# Patient Record
Sex: Male | Born: 1955
Health system: Southern US, Community
[De-identification: ages and names within clinical notes are randomized; demographics above are authoritative.]

## PROBLEM LIST (undated history)

## (undated) DIAGNOSIS — F319 Bipolar disorder, unspecified: Secondary | ICD-10-CM

## (undated) DIAGNOSIS — N289 Disorder of kidney and ureter, unspecified: Secondary | ICD-10-CM

## (undated) DIAGNOSIS — C801 Malignant (primary) neoplasm, unspecified: Secondary | ICD-10-CM

---

## 2009-01-11 DIAGNOSIS — C801 Malignant (primary) neoplasm, unspecified: Secondary | ICD-10-CM

## 2009-01-11 HISTORY — DX: Malignant (primary) neoplasm, unspecified: C80.1

## 2012-12-16 ENCOUNTER — Emergency Department (HOSPITAL_COMMUNITY)

## 2012-12-16 ENCOUNTER — Inpatient Hospital Stay (HOSPITAL_COMMUNITY)
Admission: EM | Admit: 2012-12-16 | Discharge: 2012-12-20 | DRG: 070 | Attending: Internal Medicine | Admitting: Internal Medicine

## 2012-12-16 ENCOUNTER — Encounter (HOSPITAL_COMMUNITY): Payer: Self-pay | Admitting: Emergency Medicine

## 2012-12-16 DIAGNOSIS — M6282 Rhabdomyolysis: Secondary | ICD-10-CM | POA: Diagnosis present

## 2012-12-16 DIAGNOSIS — G9340 Encephalopathy, unspecified: Principal | ICD-10-CM | POA: Diagnosis present

## 2012-12-16 DIAGNOSIS — R918 Other nonspecific abnormal finding of lung field: Secondary | ICD-10-CM | POA: Diagnosis present

## 2012-12-16 DIAGNOSIS — N179 Acute kidney failure, unspecified: Secondary | ICD-10-CM | POA: Diagnosis present

## 2012-12-16 DIAGNOSIS — E871 Hypo-osmolality and hyponatremia: Secondary | ICD-10-CM | POA: Diagnosis present

## 2012-12-16 DIAGNOSIS — F29 Unspecified psychosis not due to a substance or known physiological condition: Secondary | ICD-10-CM | POA: Diagnosis present

## 2012-12-16 DIAGNOSIS — Z8659 Personal history of other mental and behavioral disorders: Secondary | ICD-10-CM

## 2012-12-16 DIAGNOSIS — B182 Chronic viral hepatitis C: Secondary | ICD-10-CM

## 2012-12-16 DIAGNOSIS — E869 Volume depletion, unspecified: Secondary | ICD-10-CM | POA: Diagnosis present

## 2012-12-16 DIAGNOSIS — Z87891 Personal history of nicotine dependence: Secondary | ICD-10-CM

## 2012-12-16 DIAGNOSIS — J189 Pneumonia, unspecified organism: Secondary | ICD-10-CM | POA: Diagnosis present

## 2012-12-16 DIAGNOSIS — B1920 Unspecified viral hepatitis C without hepatic coma: Secondary | ICD-10-CM | POA: Diagnosis present

## 2012-12-16 DIAGNOSIS — E876 Hypokalemia: Secondary | ICD-10-CM | POA: Diagnosis present

## 2012-12-16 DIAGNOSIS — N19 Unspecified kidney failure: Secondary | ICD-10-CM

## 2012-12-16 DIAGNOSIS — F22 Delusional disorders: Secondary | ICD-10-CM | POA: Diagnosis present

## 2012-12-16 HISTORY — DX: Malignant (primary) neoplasm, unspecified: C80.1

## 2012-12-16 LAB — CBC
HCT: 35.7 % — ABNORMAL LOW (ref 39.0–52.0)
Hemoglobin: 12.9 g/dL — ABNORMAL LOW (ref 13.0–17.0)
MCH: 31 pg (ref 26.0–34.0)
MCHC: 36.1 g/dL — ABNORMAL HIGH (ref 30.0–36.0)
RDW: 12.8 % (ref 11.5–15.5)
WBC: 20.4 10*3/uL — ABNORMAL HIGH (ref 4.0–10.5)

## 2012-12-16 LAB — COMPREHENSIVE METABOLIC PANEL
AST: 242 U/L — ABNORMAL HIGH (ref 0–37)
Alkaline Phosphatase: 64 U/L (ref 39–117)
BUN: 77 mg/dL — ABNORMAL HIGH (ref 6–23)
Calcium: 9.6 mg/dL (ref 8.4–10.5)
Chloride: 88 mEq/L — ABNORMAL LOW (ref 96–112)
GFR calc Af Amer: 26 mL/min — ABNORMAL LOW (ref >90)
Glucose, Bld: 128 mg/dL — ABNORMAL HIGH (ref 70–99)
Sodium: 124 mEq/L — ABNORMAL LOW (ref 135–145)
Total Bilirubin: 1.3 mg/dL — ABNORMAL HIGH (ref 0.3–1.2)
Total Protein: 7 g/dL (ref 6.0–8.3)

## 2012-12-16 LAB — DIFFERENTIAL
Basophils Absolute: 0 10*3/uL (ref 0.0–0.1)
Basophils Relative: 0 % (ref 0–1)
Lymphs Abs: 0.9 10*3/uL (ref 0.7–4.0)
Monocytes Relative: 9 % (ref 3–12)
Neutro Abs: 16.7 10*3/uL — ABNORMAL HIGH (ref 1.7–7.7)
Neutrophils Relative %: 87 % — ABNORMAL HIGH (ref 43–77)

## 2012-12-16 LAB — ETHANOL: Alcohol, Ethyl (B): <11 mg/dL (ref 0–11)

## 2012-12-16 MED ORDER — LORAZEPAM 2 MG/ML IJ SOLN
1.0000 mg | Freq: Once | INTRAMUSCULAR | Status: AC
  Administered 2012-12-16: 1 mg via INTRAVENOUS
  Filled 2012-12-16: qty 1

## 2012-12-16 MED ORDER — SODIUM CHLORIDE 0.9 % IV BOLUS (SEPSIS)
1000.0000 mL | Freq: Once | INTRAVENOUS | Status: AC
  Administered 2012-12-16: 1000 mL via INTRAVENOUS

## 2012-12-16 NOTE — ED Notes (Signed)
Pt belongings: Black jacket, black sneakers, black hoodie, white socks, white t-shirt, blue jeans, tan boxers.

## 2012-12-16 NOTE — ED Notes (Addendum)
Pt seen shaking in upper extremities.  Pt is yelling but responding to questions. Dr. Criss Alvine aware.

## 2012-12-16 NOTE — ED Notes (Signed)
Pt was [facility] days ago and he has no place to go,  He was seen by a passer by in an alcove [place] and they called 911,  Upon arrival of EMS he said he was [employer].  Pt arrived with [employer] also,.  Blood gluocse 135

## 2012-12-16 NOTE — ED Notes (Addendum)
Pt. has two belonging bags in locker # 26.

## 2012-12-16 NOTE — ED Notes (Signed)
Bed: QI69 Expected date:  Expected time:  Means of arrival:  Comments: EMS 57yo M hallucinations leg pain

## 2012-12-16 NOTE — ED Provider Notes (Signed)
CSN: [id number]     Arrival date & time 12/16/12  2059 History   First MD Initiated Contact with Patient 12/16/12 2130     Chief Complaint  Patient presents with  . Hallucinations   (Consider location/radiation/quality/duration/timing/severity/associated sxs/prior Treatment) HPI Comments: 57 year old male presents with EMS and police after being found sleeping downtown. patient stated that his name is [name] talk to by EMS. Her temperature is normal. Police noted that they had been called about this patient last night and he got up when asked and ambulated ok so they left him alone. Here patient states his name is [employer]. He frequently talks about God as well. He states he's had trouble walking due to leg weakness bilaterally for "a long time" but cannot specify how long. He states the correct date when asked but states he doesn't think that can be right. Denies any current alcohol or drug use. Police state he has been arrested in 5 different states per their records, including 2 on [address] When they contacted his most recent prison, they noted that he had a history of "mental illness". Patient knows president, year and month correctly.    No past medical history on file. No past surgical history on file. No family history on file. History  Substance Use Topics  . Smoking status: Former Games developer  . Smokeless tobacco: Not on file  . Alcohol Use: No    Review of Systems  Constitutional: Negative for fever.  Respiratory: Negative for cough and shortness of breath.   Gastrointestinal: Negative for vomiting.  Neurological: Positive for weakness.  Psychiatric/Behavioral: Negative for hallucinations and self-injury.  All other systems reviewed and are negative.    Allergies  Review of patient's allergies indicates no known allergies.  Home Medications  No current outpatient prescriptions on file. BP 135/68  Pulse 95  Temp(Src) 98.4 F (36.9 C) (Oral)  Resp 18  SpO2  98% Physical Exam  Nursing note and vitals reviewed. Constitutional: He is oriented to person, place, and time. He appears well-developed and well-nourished.  HENT:  Head: Normocephalic and atraumatic.  Right Ear: External ear normal.  Left Ear: External ear normal.  Nose: Nose normal.  Erythematous face with crusting  Eyes: Right eye exhibits no discharge. Left eye exhibits no discharge.  Neck: Neck supple.  Cardiovascular: Normal rate, regular rhythm, normal heart sounds and intact distal pulses.   Pulmonary/Chest: Effort normal.  Abdominal: Soft. He exhibits no distension. There is no tenderness.  Musculoskeletal: He exhibits edema (not pitting edema to bilateral lower extremities).  Neurological: He is alert and oriented to person, place, and time.  Bilateral lower extremity weakness, normal sensation  Skin: Skin is warm and dry.  Psychiatric: His speech is tangential. Thought content is delusional.    ED Course  Procedures (including critical care time) Labs Review Labs Reviewed  CBC - Abnormal; Notable for the following:    WBC 20.4 (*)    RBC 4.16 (*)    Hemoglobin 12.9 (*)    HCT 35.7 (*)    MCHC 36.1 (*)    All other components within normal limits  COMPREHENSIVE METABOLIC PANEL - Abnormal; Notable for the following:    Sodium 124 (*)    Chloride 88 (*)    Glucose, Bld 128 (*)    BUN 77 (*)    Creatinine, Ser 2.98 (*)    AST 242 (*)    ALT 65 (*)    Total Bilirubin 1.3 (*)    GFR  calc non Af Amer 22 (*)    GFR calc Af Amer 26 (*)    All other components within normal limits  SALICYLATE LEVEL - Abnormal; Notable for the following:    Salicylate Lvl <2.0 (*)    All other components within normal limits  CK - Abnormal; Notable for the following:    Total CK 6465 (*)    All other components within normal limits  DIFFERENTIAL - Abnormal; Notable for the following:    Neutrophils Relative % 87 (*)    Neutro Abs 16.7 (*)    Lymphocytes Relative 5 (*)     Monocytes Absolute 1.7 (*)    All other components within normal limits  CULTURE, BLOOD (ROUTINE X 2)  CULTURE, BLOOD (ROUTINE X 2)  ACETAMINOPHEN LEVEL  ETHANOL  URINE RAPID DRUG SCREEN (HOSP PERFORMED)  URINALYSIS, ROUTINE W REFLEX MICROSCOPIC  CG4 I-STAT (LACTIC ACID)   Imaging Review Dg Chest 2 View  12/16/2012   CLINICAL DATA:  Hallucinations; remote history of smoking.  EXAM: CHEST  2 VIEW  COMPARISON:  None.  FINDINGS: The lungs are well-aerated. Vague mild right central airspace opacity may reflect atelectasis or possibly mild pneumonia. There is no evidence of pleural effusion or pneumothorax.  The heart is normal in size; the mediastinal contour is within normal limits. No acute osseous abnormalities are seen.  IMPRESSION: Vague mild right central airspace opacity may reflect atelectasis or possibly mild pneumonia.   Electronically Signed   By: Roanna Raider M.D.   On: 12/16/2012 22:58   Ct Head Wo Contrast  12/16/2012   CLINICAL DATA:  Altered mental status.  EXAM: CT HEAD WITHOUT CONTRAST  TECHNIQUE: Contiguous axial images were obtained from the base of the skull through the vertex without intravenous contrast.  COMPARISON:  None.  FINDINGS: Normal appearing cerebral hemispheres and posterior fossa structures. Normal size and position of the ventricles. No intracranial hemorrhage, mass lesion or CT evidence of acute infarction. Nasal bone fractures without overlying soft tissue swelling. These appear somewhat corticated.  IMPRESSION: 1. No intracranial abnormality. 2. Nasal bone fractures, most likely old.   Electronically Signed   By: Gordan Payment M.D.   On: 12/16/2012 22:21    EKG Interpretation    Date/Time:  Sunday December 17 2012 01:07:08 EST Ventricular Rate:  86 PR Interval:  139 QRS Duration: 96 QT Interval:  372 QTC Calculation: 445 R Axis:   -44 Text Interpretation:  Sinus rhythm Atrial premature complexes Left axis deviation No old tracing to compare Confirmed by  Aliviyah Malanga  MD, Christia Domke ([id number]) on 12/17/2012 1:10:55 AM            MDM   1. Renal failure   2. Community acquired pneumonia   3. Psychosis    Patient appears psychotic, stating he is [employer] and stating he isn't allowed to sleep. When asked to clarify he states God won't let him. States he hasn't had water in over 2 months. Dehydrated on labs, no baseline to compare. Will give fluids and assess urine and UDS. Alert and oriented, no fever. Feel meningitis or encephalitis is less likely. Given he has been sleeping in cold weather, will cover for PNA based on his Xray. Will admit to medicine for abx and fluids. Psych consulted.     Audree Camel, MD 12/17/12 [phone]

## 2012-12-16 NOTE — ED Notes (Signed)
Pt also states he hasn't eaten in 3 days since released from prison, this was reported from EMS

## 2012-12-16 NOTE — BH Assessment (Signed)
Attempted multiple times without success to connect to tele-assessment equipment. TTS staff will go to [facility] for face-to-face assessment.  Harlin Rain Ria Comment, [facility] Triage Specialist

## 2012-12-16 NOTE — ED Notes (Signed)
Pt states "I am Andrew Chaney" and that he is weak from not "eating and drinking anything for 2.5 months." Pt denies SI/HI or A/V Hallucinations.

## 2012-12-16 NOTE — ED Notes (Signed)
Pt has a raised soft lump in the middle of the chest.

## 2012-12-16 NOTE — BH Assessment (Signed)
Received call for tele-assessment. Spoke with Dr. Criss Alvine who said Pt has a history of incarceration and was picked up by police for sleeping outside an establishment. Pt insists he is [name]. Tele-assessment will be initiated.  Harlin Rain Ria Comment, [facility] Triage Specialist

## 2012-12-17 ENCOUNTER — Encounter (HOSPITAL_COMMUNITY): Payer: Self-pay | Admitting: Emergency Medicine

## 2012-12-17 DIAGNOSIS — N179 Acute kidney failure, unspecified: Secondary | ICD-10-CM

## 2012-12-17 DIAGNOSIS — E871 Hypo-osmolality and hyponatremia: Secondary | ICD-10-CM

## 2012-12-17 DIAGNOSIS — F29 Unspecified psychosis not due to a substance or known physiological condition: Secondary | ICD-10-CM | POA: Diagnosis present

## 2012-12-17 DIAGNOSIS — F062 Psychotic disorder with delusions due to known physiological condition: Secondary | ICD-10-CM

## 2012-12-17 DIAGNOSIS — G9340 Encephalopathy, unspecified: Principal | ICD-10-CM | POA: Diagnosis present

## 2012-12-17 DIAGNOSIS — J189 Pneumonia, unspecified organism: Secondary | ICD-10-CM

## 2012-12-17 DIAGNOSIS — N19 Unspecified kidney failure: Secondary | ICD-10-CM

## 2012-12-17 DIAGNOSIS — M6282 Rhabdomyolysis: Secondary | ICD-10-CM

## 2012-12-17 LAB — RAPID URINE DRUG SCREEN, HOSP PERFORMED
Barbiturates: NOT DETECTED
Benzodiazepines: NOT DETECTED
Cocaine: NOT DETECTED
Opiates: NOT DETECTED
Tetrahydrocannabinol: NOT DETECTED

## 2012-12-17 LAB — CBC
HCT: 30.6 % — ABNORMAL LOW (ref 39.0–52.0)
Hemoglobin: 10.6 g/dL — ABNORMAL LOW (ref 13.0–17.0)
MCH: 29.9 pg (ref 26.0–34.0)
Platelets: 199 10*3/uL (ref 150–400)
RBC: 3.54 MIL/uL — ABNORMAL LOW (ref 4.22–5.81)
RDW: 12.8 % (ref 11.5–15.5)
WBC: 14.9 10*3/uL — ABNORMAL HIGH (ref 4.0–10.5)

## 2012-12-17 LAB — RAPID HIV SCREEN (WH-MAU): Rapid HIV Screen: NONREACTIVE

## 2012-12-17 LAB — URINALYSIS, ROUTINE W REFLEX MICROSCOPIC
Bilirubin Urine: NEGATIVE
Glucose, UA: NEGATIVE mg/dL
Nitrite: NEGATIVE
Specific Gravity, Urine: 1.016 (ref 1.005–1.030)
pH: 5 (ref 5.0–8.0)

## 2012-12-17 LAB — BASIC METABOLIC PANEL
Chloride: 97 mEq/L (ref 96–112)
Creatinine, Ser: 1.88 mg/dL — ABNORMAL HIGH (ref 0.50–1.35)
GFR calc Af Amer: 45 mL/min — ABNORMAL LOW (ref >90)
Potassium: 3.9 mEq/L (ref 3.5–5.1)
Sodium: 131 mEq/L — ABNORMAL LOW (ref 135–145)

## 2012-12-17 LAB — URINE MICROSCOPIC-ADD ON

## 2012-12-17 LAB — VITAMIN B12: Vitamin B-12: 621 pg/mL (ref 211–911)

## 2012-12-17 LAB — NA AND K (SODIUM & POTASSIUM), RAND UR: Sodium, Ur: <15 mEq/L

## 2012-12-17 MED ORDER — ONDANSETRON HCL 4 MG/2ML IJ SOLN: 4.0000 mg | Freq: Four times a day (QID) | INTRAMUSCULAR | Status: DC | PRN

## 2012-12-17 MED ORDER — DEXTROSE 5 % IV SOLN
500.0000 mg | INTRAVENOUS | Status: DC
  Administered 2012-12-18: 500 mg via INTRAVENOUS
  Filled 2012-12-17: qty 500

## 2012-12-17 MED ORDER — ENOXAPARIN SODIUM 30 MG/0.3ML ~~LOC~~ SOLN
30.0000 mg | SUBCUTANEOUS | Status: DC
  Administered 2012-12-17: 30 mg via SUBCUTANEOUS
  Filled 2012-12-17 (×2): qty 0.3

## 2012-12-17 MED ORDER — INFLUENZA VAC SPLIT QUAD 0.5 ML IM SUSP
0.5000 mL | Freq: Once | INTRAMUSCULAR | Status: DC
  Filled 2012-12-17: qty 0.5

## 2012-12-17 MED ORDER — DEXTROSE 5 % IV SOLN
1.0000 g | INTRAVENOUS | Status: DC
  Administered 2012-12-18 – 2012-12-20 (×3): 1 g via INTRAVENOUS
  Filled 2012-12-17 (×3): qty 10

## 2012-12-17 MED ORDER — BIOTENE DRY MOUTH MT LIQD
15.0000 mL | Freq: Two times a day (BID) | OROMUCOSAL | Status: DC
  Administered 2012-12-17 – 2012-12-20 (×3): 15 mL via OROMUCOSAL

## 2012-12-17 MED ORDER — ALUM & MAG HYDROXIDE-SIMETH 200-200-20 MG/5ML PO SUSP: 30.0000 mL | Freq: Four times a day (QID) | ORAL | Status: DC | PRN

## 2012-12-17 MED ORDER — ZOLPIDEM TARTRATE 5 MG PO TABS: 5.0000 mg | ORAL_TABLET | Freq: Every evening | ORAL | Status: DC | PRN

## 2012-12-17 MED ORDER — HYDROMORPHONE HCL PF 1 MG/ML IJ SOLN: 0.5000 mg | INTRAMUSCULAR | Status: DC | PRN

## 2012-12-17 MED ORDER — SODIUM CHLORIDE 0.9 % IV SOLN: INTRAVENOUS | Status: AC

## 2012-12-17 MED ORDER — DEXTROSE 5 % IV SOLN
500.0000 mg | Freq: Once | INTRAVENOUS | Status: AC
  Administered 2012-12-17: 04:00:00 500 mg via INTRAVENOUS
  Filled 2012-12-17: qty 500

## 2012-12-17 MED ORDER — OXYCODONE HCL 5 MG PO TABS
5.0000 mg | ORAL_TABLET | ORAL | Status: DC | PRN
  Administered 2012-12-17 – 2012-12-18 (×3): 5 mg via ORAL
  Filled 2012-12-17 (×3): qty 1

## 2012-12-17 MED ORDER — BIOTENE DRY MOUTH MT LIQD
15.0000 mL | Freq: Two times a day (BID) | OROMUCOSAL | Status: DC
  Administered 2012-12-18 – 2012-12-20 (×3): 15 mL via OROMUCOSAL

## 2012-12-17 MED ORDER — ONDANSETRON HCL 4 MG PO TABS: 4.0000 mg | ORAL_TABLET | Freq: Four times a day (QID) | ORAL | Status: DC | PRN

## 2012-12-17 MED ORDER — SODIUM CHLORIDE 0.9 % IV SOLN
INTRAVENOUS | Status: DC
  Administered 2012-12-17 – 2012-12-20 (×13): via INTRAVENOUS

## 2012-12-17 MED ORDER — PNEUMOCOCCAL VAC POLYVALENT 25 MCG/0.5ML IJ INJ
0.5000 mL | INJECTION | Freq: Once | INTRAMUSCULAR | Status: DC
  Filled 2012-12-17: qty 0.5

## 2012-12-17 MED ORDER — ACETAMINOPHEN 325 MG PO TABS: 650.0000 mg | ORAL_TABLET | Freq: Four times a day (QID) | ORAL | Status: DC | PRN

## 2012-12-17 MED ORDER — DEXTROSE 5 % IV SOLN
1.0000 g | Freq: Once | INTRAVENOUS | Status: AC
  Administered 2012-12-17: 1 g via INTRAVENOUS
  Filled 2012-12-17 (×2): qty 10

## 2012-12-17 MED ORDER — ACETAMINOPHEN 650 MG RE SUPP: 650.0000 mg | Freq: Four times a day (QID) | RECTAL | Status: DC | PRN

## 2012-12-17 NOTE — Progress Notes (Addendum)
[facility] PROGRESS NOTE  Andrew Chaney WUJ:[id number] DOB: November 16, 1955 DOA: 12/16/2012 PCP: No primary provider on file.  Assessment/Plan:  Acute Encephalopathy - Metabolic versus Functional or Organic, Has a Psych history and records have been requested from [facility] to evaluate today, awaiting recommendations. Will determine where/if we can obtain his psychiatric records from [place].  -Obtain HIV, acute hepatitis panel, TSH, free T4, B12, folate, methylmalonic acid  Acute Renal Failure - Correcting with IVFs and monitor BUN/Cr.   Hyponatremia - Correcting with continued hydration of patient  -due to dehydration, should correct with IVFs, Check Urine electrolytes and Urine OSms. And monitor Na+ Levels.   Rhabdomyolysis - Increase normal saline to 250 ml/hr -Recheck CPK levels every 4 hours.   CAP - IV Rocephin and Azithromycin.   Psychiatric disorder with delusions  -Reconsult since adult BH     Code Status: Full Family Communication: None available Disposition Plan: Per psychiatry recommendation   Consultants:  Psychiatry  Procedures: Blood culture 12/17/2012; pending  CXR 12/16/2012 Vague mild right central airspace opacity may reflect atelectasis or  possibly mild pneumonia.  CT head without contrast 12/16/2012 1. No intracranial abnormality.  2. Nasal bone fractures, most likely old    Antibiotics:  Ceftriaxone 12/7>>>  Azithromycin 12/7>>>    HPI/Subjective: Andrew Chaney is a 57 y.o WM, PMHx unknown. Found by the police sleeping outside and when they spoke to him He replied that he was "[employer]". He was talking and not making much sense and was brought to the ED for an evaluation. He has been rambling on and on, and at times had episodes where he was shaking his lower extremities or his upper extremities. He reports that he just got out of prison, but the police reports that he has been released from [place] 3 days ago. He has  not been eating or drinking very well over there past 3 days. He has moments of clarity but has not been forthcoming with his medical history. He has only said that he does not have Bipolar Disorder. An attempt has been made to obtain his medical records from [place], but the records can not be released until the AM.  In the ED, Psychiatry was consulted, and he was evaluated by the EDP and found to have multiple lab abnormalities so he was referred for admission to medicine. He was evaluated by [facility] and reported that he is suicidal and has a plan to use bed sheets to hang himself and he has been placed on suicide precautions, and a 1:1 sitter has been requested. TODAY patient A./O. x1, still unable to inform medical staff of how he actually got here. Does not know where he is, when it is, why he's here. Patient does know name at this time. States just released from [place]. Continues to have nonsensical speech (rambling) Objective: Filed Vitals:   12/17/12 0122 12/17/12 0123 12/17/12 0230 12/17/12 0700  BP:   128/83 107/66  Pulse:   88 80  Temp:   98.2 F (36.8 C) 98.9 F (37.2 C)  TempSrc:   Oral Oral  Resp:   16 18  SpO2: 83% 98% 99% 98%    Intake/Output Summary (Last 24 hours) at 12/17/12 1147 Last data filed at 12/17/12 0700  Gross per 24 hour  Intake    550 ml  Output   1500 ml  Net   -950 ml   Filed Weights    Exam:   General:  A./O. x1, NAD*  Cardiovascular: Regular  with a rate, negative murmurs rubs gallops, DP/PT pulse one plus bilateral  Respiratory: Clear to auscultation bilateral  Abdomen: Soft, nontender, nondistended, plus bowel sound  Musculoskeletal: Negative pedal edema, negative cyanosis, negative rash except for a malar rash on the face.  Data Reviewed: Basic Metabolic Panel:  Recent Labs Lab 12/16/12 2135 12/17/12 0427  NA 124* 131*  K 4.3 3.9  CL 88* 97  CO2 20 22  GLUCOSE 128* 113*  BUN 77* 60*  CREATININE 2.98* 1.88*  CALCIUM 9.6  8.6   Liver Function Tests:  Recent Labs Lab 12/16/12 2135  AST 242*  ALT 65*  ALKPHOS 64  BILITOT 1.3*  PROT 7.0  ALBUMIN 4.2   No results found for this basename: LIPASE, AMYLASE,  in the last 168 hours No results found for this basename: AMMONIA,  in the last 168 hours CBC:  Recent Labs Lab 12/16/12 2135 12/16/12 2231 12/17/12 0427  WBC 20.4*  --  14.9*  NEUTROABS  --  16.7*  --   HGB 12.9*  --  10.6*  HCT 35.7*  --  30.6*  MCV 85.8  --  86.4  PLT 233  --  199   Cardiac Enzymes:  Recent Labs Lab 12/16/12 2146  CKTOTAL 6465*   BNP (last 3 results) No results found for this basename: PROBNP,  in the last 8760 hours CBG: No results found for this basename: GLUCAP,  in the last 168 hours  No results found for this or any previous visit (from the past 240 hour(s)).   Studies: Dg Chest 2 View  12/16/2012   CLINICAL DATA:  Hallucinations; remote history of smoking.  EXAM: CHEST  2 VIEW  COMPARISON:  None.  FINDINGS: The lungs are well-aerated. Vague mild right central airspace opacity may reflect atelectasis or possibly mild pneumonia. There is no evidence of pleural effusion or pneumothorax.  The heart is normal in size; the mediastinal contour is within normal limits. No acute osseous abnormalities are seen.  IMPRESSION: Vague mild right central airspace opacity may reflect atelectasis or possibly mild pneumonia.   Electronically Signed   By: Roanna Raider M.D.   On: 12/16/2012 22:58   Ct Head Wo Contrast  12/16/2012   CLINICAL DATA:  Altered mental status.  EXAM: CT HEAD WITHOUT CONTRAST  TECHNIQUE: Contiguous axial images were obtained from the base of the skull through the vertex without intravenous contrast.  COMPARISON:  None.  FINDINGS: Normal appearing cerebral hemispheres and posterior fossa structures. Normal size and position of the ventricles. No intracranial hemorrhage, mass lesion or CT evidence of acute infarction. Nasal bone fractures without overlying  soft tissue swelling. These appear somewhat corticated.  IMPRESSION: 1. No intracranial abnormality. 2. Nasal bone fractures, most likely old.   Electronically Signed   By: Gordan Payment M.D.   On: 12/16/2012 22:21    Scheduled Meds: . sodium chloride   Intravenous STAT  . antiseptic oral rinse  15 mL Mouth Rinse BID  . antiseptic oral rinse  15 mL Mouth Rinse BID  . [START ON 12/18/2012] azithromycin  500 mg Intravenous Q24H  . [START ON 12/18/2012] cefTRIAXone (ROCEPHIN)  IV  1 g Intravenous Q24H  . enoxaparin (LOVENOX) injection  30 mg Subcutaneous Q24H  . influenza vac split quadrivalent PF  0.5 mL Intramuscular Once  . pneumococcal 23 valent vaccine  0.5 mL Intramuscular Once   Continuous Infusions: . sodium chloride 100 mL/hr at 12/17/12 0300    Principal Problem:   Acute encephalopathy  Active Problems:   Renal failure   ARF (acute renal failure)   Hyponatremia   CAP (community acquired pneumonia)   Rhabdomyolysis    Time spent: 40 minutes   Ayeisha Lindenberger, J  [facility] Pager [phone]. If 7PM-7AM, please contact night-coverage at [email or web address], password [facility] 12/17/2012, 11:47 AM  LOS: 1 day

## 2012-12-17 NOTE — Progress Notes (Signed)
Report received at 1:30am on pt going to Rm#[id number] for tele-monitoring.

## 2012-12-17 NOTE — Progress Notes (Signed)
12/17/2012 1650 Plan is to dc to prison once released from hospital. NCM will continue to follow for dc needs. Isidoro Donning RN CCM Case Mgmt phone [phone]

## 2012-12-17 NOTE — H&P (Signed)
[facility] History and Physical  Andrew Chaney ZOX:[id number] DOB: [birth year] DOA: 12/16/2012  Referring physician:  PCP: No primary provider on file.  Specialists:   Chief Complaint:  Hallucinations  HPI: Andrew Chaney is a 57 y.o. male who was found by the police sleeping outside and when they spoke to him  He replied that he was "[address]".   He was talking and not making much sense and was brought to the ED for an evaluation.  He has been rambling on and on, and at times had episodes where he was shaking his lower extremities or his upper extremities.   He reports that he just got out of prison, but the police reports that he has been released from [place] 3 days ago.  He has not been eating or drinking very well over there past 3 days.  He has moments of clarity but has not been forthcoming with his medical history.   He has only said that he does not have Bipolar Disorder.  An attempt has been made to obtain his medical records from [place], but the records can not be released until the AM.      In the ED, Psychiatry was consulted, and he was evaluated by the EDP and found to have multiple lab abnormalities so he was referred for admission to medicine.   He was evaluated by [facility] and reported that he is suicidal and has a plan to use bed sheets to hang himself and he has been placed on suicide precautions, and a 1:1 sitter has been requested.      Review of Systems:   Unable to Obtain from the Patient   No past medical history on file.  No past surgical history on file.   Prior to Admission medications   Not on File    No Known Allergies     Social History:  reports that he has quit smoking. He does not have any smokeless tobacco history on file. He reports that he uses illicit drugs (Marijuana). He reports that he does not drink alcohol.     No family history on file.    Physical Exam:  GEN: Disheveled 57 y.o. thin Caucasian male  examined  and in  no acute distress;  Filed Vitals:   12/17/12 0115 12/17/12 0120 12/17/12 0122 12/17/12 0123  BP:  103/53    Pulse: 83 82    Temp:      TempSrc:      Resp: 13 14    SpO2: 99% 98% 83% 98%   Blood pressure 103/53, pulse 82, temperature 97.7 F (36.5 C), temperature source Rectal, resp. rate 14, SpO2 98.00%. PSYCH: He is alert and oriented x 2; does not appear anxious does not appear depressed; affect is normal HEENT: Normocephalic and Atraumatic, Mucous membranes pink; PERRLA; EOM intact; Fundi:  Benign;  No scleral icterus, Nares: Patent, Oropharynx: Clear, Fair Dentition, Neck:  FROM, no cervical lymphadenopathy nor thyromegaly or carotid bruit; no JVD; Breasts:: Not examined CHEST WALL: No tenderness CHEST: Normal respiration, clear to auscultation bilaterally HEART: Regular rate and rhythm; no murmurs rubs or gallops BACK: No kyphosis or scoliosis; no CVA tenderness ABDOMEN: Positive Bowel Sounds, Scaphoid, soft non-tender; no masses, no organomegaly. Rectal Exam: Not done EXTREMITIES: No bone or joint deformity; age-appropriate arthropathy of the hands and knees; no cyanosis, clubbing or edema; no ulcerations. Genitalia: not examined PULSES: 2+ and symmetric SKIN: Decreased Skin Turgor,  +Scaling of  Face  CNS: Cranial nerves 2-12 grossly  intact no focal neurologic deficit.  MME Score:      Labs on Admission:  Basic Metabolic Panel:  Recent Labs Lab 12/16/12 2135  NA 124*  K 4.3  CL 88*  CO2 20  GLUCOSE 128*  BUN 77*  CREATININE 2.98*  CALCIUM 9.6   Liver Function Tests:  Recent Labs Lab 12/16/12 2135  AST 242*  ALT 65*  ALKPHOS 64  BILITOT 1.3*  PROT 7.0  ALBUMIN 4.2   No results found for this basename: LIPASE, AMYLASE,  in the last 168 hours No results found for this basename: AMMONIA,  in the last 168 hours CBC:  Recent Labs Lab 12/16/12 2135 12/16/12 2231  WBC 20.4*  --   NEUTROABS  --  16.7*  HGB 12.9*  --   HCT 35.7*  --   MCV 85.8  --    PLT 233  --    Cardiac Enzymes:  Recent Labs Lab 12/16/12 2146  CKTOTAL 6465*    BNP (last 3 results) No results found for this basename: PROBNP,  in the last 8760 hours CBG: No results found for this basename: GLUCAP,  in the last 168 hours  Radiological Exams on Admission: Dg Chest 2 View  12/16/2012   CLINICAL DATA:  Hallucinations; remote history of smoking.  EXAM: CHEST  2 VIEW  COMPARISON:  None.  FINDINGS: The lungs are well-aerated. Vague mild right central airspace opacity may reflect atelectasis or possibly mild pneumonia. There is no evidence of pleural effusion or pneumothorax.  The heart is normal in size; the mediastinal contour is within normal limits. No acute osseous abnormalities are seen.  IMPRESSION: Vague mild right central airspace opacity may reflect atelectasis or possibly mild pneumonia.   Electronically Signed   By: Roanna Raider M.D.   On: 12/16/2012 22:58   Ct Head Wo Contrast  12/16/2012   CLINICAL DATA:  Altered mental status.  EXAM: CT HEAD WITHOUT CONTRAST  TECHNIQUE: Contiguous axial images were obtained from the base of the skull through the vertex without intravenous contrast.  COMPARISON:  None.  FINDINGS: Normal appearing cerebral hemispheres and posterior fossa structures. Normal size and position of the ventricles. No intracranial hemorrhage, mass lesion or CT evidence of acute infarction. Nasal bone fractures without overlying soft tissue swelling. These appear somewhat corticated.  IMPRESSION: 1. No intracranial abnormality. 2. Nasal bone fractures, most likely old.   Electronically Signed   By: Gordan Payment M.D.   On: 12/16/2012 22:21     EKG: Independently reviewed.  Normal Sinus Rhythm, No Acute S-T changes   Assessment/Plan Principal Problem:   Acute encephalopathy Active Problems:   Renal failure   ARF (acute renal failure)   Hyponatremia   CAP (community acquired pneumonia)   Rhabdomyolysis      1.   Acute Encephalopathy-    Metabolic versus Functional or Organic,  Has a Psych history and records have been requested from [facility].   Hallucinations most likely due to Psych History and has Psychosis.     2.   Acute Renal Failure- due to dehydration, IVFs and monitor BUN/Cr.    3.  Hyponatremia- due to dehydration, should correct with IVFs,  Check Urine electrolytes and Urine OSms.  And  Moniotr Na+ Levels.    4.  Rhabdomyolysis-  IVFs monitor CPK levels.    5.  CAP-  IV Rocephin and Azithromycin.    6.  DVT Prophylaxis ordered.    7.  Other-  Psych Behavioral health consulted, and  Social  Work Aeronautical engineer.         Code Status:   FULL CODE Family Communication:    No Family Present Disposition Plan:    Inpatient       Time spent:  33 Minutes  Ron Parker [facility] Pager [phone]  If 7PM-7AM, please contact night-coverage [email or web address] Password [email or web address] 12/17/2012, 1:39 AM

## 2012-12-17 NOTE — Consult Note (Signed)
  Recommend psychiatric inpatient admission when medically cleared. Patient not medically cleared at this time and is pending medical hospital admission.  Recommend psychiatry consult for management of suicidal ideation during hospitalization.   Agree with assessment and plan Andrew Chaney A. Dub Mikes, M.D.

## 2012-12-17 NOTE — BH Assessment (Addendum)
Assessment Note Pt brought to [facility] by [employer] after being picked up downtown.  Pt denies HI/AVH.  However, he does endorse SI with a plan to hang himself with his bed sheets.  Pt states that he was just released from prison on Dec 3 after serving 77 months for a bank robbery.  Pt states that he is on parole for 3 years and is supposed to be in a [employer].   However he never reported to the halfway house after discharge from prison.  Pt is religiously preoccupied stating, "I'm not allowed to sleep because God is not letting it happen.  You will see it on the news, CNN."  "God controls everything.  He is the true Pharmacologist.  This is all ending soon."  Pt states that he has not had water, food, or sleep for 2.5-3 months.  Pt is tangential and has flight of ideas.  Pt states, "Try not sleeping for 2 1/2 to 3 months.  97% of my vessel is made of water.  I'm losing my water.  I need sleep.  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  Pt states that his cancer is currently in remission.  Pt states that upon discharge from prison he was given a "bag of medicine."  Pt states, "I have not have meds since I left prison.  I will not take that bullshit psychotropic poison."  Pt states that his father was diagnosed with Bipolar.  Pt has been declined here at [facility] by Alberteen Sam, NP for medical acuity.       Zyire Eidson is an 57 y.o. male.   Axis I: Bipolar, mixed Axis II: Deferred Axis III: No past medical history on file. Axis IV: economic problems, housing problems, occupational problems, other psychosocial or environmental problems, problems related to legal system/crime, problems related to social environment, problems with access to health care services and problems with primary support group Axis V: 31-40 impairment in reality testing  Past Medical  History: No past medical history on file.  No past surgical history on file.  Family History: No family history on file.  Social History:  reports that he has quit smoking. He does not have any smokeless tobacco history on file. He reports that he uses illicit drugs (Marijuana). He reports that he does not drink alcohol.  Additional Social History:     CIWA: CIWA-Ar BP: 103/53 mmHg (Pt sleeping.) Pulse Rate: 82 COWS:    Allergies: No Known Allergies  Home Medications:  (Not in a hospital admission)  OB/GYN Status:  No LMP for male patient.  General Assessment Data Location of Assessment: [facility] ED Is this a Tele or Face-to-Face Assessment?: Face-to-Face Is this an Initial Assessment or a Re-assessment for this encounter?: Initial Assessment Living Arrangements: Alone;Other (Comment) (homeless) Can pt return to current living arrangement?: Yes Admission Status: Voluntary Is patient capable of signing voluntary admission?: Yes Transfer from: Home Referral Source: Self/Family/Friend     [facility] Crisis Care Plan Living Arrangements: Alone;Other (Comment) (homeless) Name of Psychiatrist: none Name of Therapist: none  Education Status Is patient currently in school?: No  Risk to self Suicidal Ideation: Yes-Currently Present Suicidal Intent: Yes-Currently Present Is patient at risk for suicide?: Yes Suicidal Plan?: Yes-Currently Present Specify Current Suicidal Plan: hang with bed sheets Access to Means: Yes Specify Access  to Suicidal Means: sheets in room What has been your use of drugs/alcohol within the last 12 months?: none Previous Attempts/Gestures: No Other Self Harm Risks: N/A Triggers for Past Attempts: Other (Comment) (N/A) Intentional Self Injurious Behavior: None Family Suicide History: No Recent stressful life event(s): Financial Problems;Legal Issues (Pt released from prison Dec 3 after 77 month sentence.) Persecutory voices/beliefs?: Yes Depression:  Yes Depression Symptoms: Insomnia;Feeling worthless/self pity;Feeling angry/irritable Substance abuse history and/or treatment for substance abuse?: No  Risk to Others Homicidal Ideation: No Thoughts of Harm to Others: No Current Homicidal Intent: No Current Homicidal Plan: No Access to Homicidal Means: No Identified Victim: none History of harm to others?: No Assessment of Violence: None Noted Violent Behavior Description: None Does patient have access to weapons?: No Criminal Charges Pending?: No (Pt on parole for 3 years.  ) Does patient have a court date: No  Psychosis Hallucinations: None noted Delusions: Persecutory  Mental Status Report Appear/Hygiene: Disheveled;Poor hygiene Eye Contact: Good Motor Activity: Unremarkable Speech: Tangential Level of Consciousness: Alert Mood: Anxious;Depressed;Preoccupied Affect: Anxious;Blunted;Depressed;Preoccupied Anxiety Level: Moderate Thought Processes: Tangential;Flight of Ideas Judgement: Impaired Orientation: Person;Place;Time;Situation Obsessive Compulsive Thoughts/Behaviors: Moderate  Cognitive Functioning Concentration: Normal  ADLScreening [facility] Assessment Services) Patient's cognitive ability adequate to safely complete daily activities?: Yes Patient able to express need for assistance with ADLs?: Yes Independently performs ADLs?: Yes (appropriate for developmental age)  Prior Inpatient Therapy Prior Inpatient Therapy: Yes Prior Therapy Facilty/Provider(s): Pt released from [facility] Dec 3. States that he had a psychiatrist there.  Reason for Treatment: Imprisoned at the time.    Prior Outpatient Therapy Prior Outpatient Therapy: No Prior Therapy Facilty/Provider(s): N/A Reason for Treatment: N/A  ADL Screening (condition at time of admission) Patient's cognitive ability adequate to safely complete daily activities?: Yes Is the patient deaf or have difficulty hearing?: No Does the patient have difficulty  seeing, even when wearing glasses/contacts?: No Does the patient have difficulty concentrating, remembering, or making decisions?: No Patient able to express need for assistance with ADLs?: Yes Does the patient have difficulty dressing or bathing?: No Independently performs ADLs?: Yes (appropriate for developmental age) Does the patient have difficulty walking or climbing stairs?: No Weakness of Legs: None Weakness of Arms/Hands: None  Home Assistive Devices/Equipment Home Assistive Devices/Equipment: None  Therapy Consults (therapy consults require a physician order) PT Evaluation Needed: No OT Evalulation Needed: No SLP Evaluation Needed: No Abuse/Neglect Assessment (Assessment to be complete while patient is alone) Physical Abuse: Denies Verbal Abuse: Denies Sexual Abuse: Denies Exploitation of patient/patient's resources: Denies Self-Neglect: Denies Values / Beliefs Cultural Requests During Hospitalization: None Spiritual Requests During Hospitalization: None Consults Spiritual Care Consult Needed: No Social Work Consult Needed: No Merchant navy officer (For Healthcare) Advance Directive: Patient does not have advance directive;Patient would not like information Pre-existing out of facility DNR order (yellow form or pink MOST form): No    Additional Information 1:1 In Past 12 Months?: No CIRT Risk: No Elopement Risk: No     Disposition:  Disposition Initial Assessment Completed for this Encounter: Yes  On Site Evaluation by:   Reviewed with Physician:    Marion Downer 12/17/2012 1:38 AM

## 2012-12-17 NOTE — Progress Notes (Signed)
Per chart, Pt to return to prison upon d/c.  No CSW needs.  Providence Crosby, LCSWA Clinical Social Work [phone]

## 2012-12-18 LAB — CBC WITH DIFFERENTIAL/PLATELET
Basophils Absolute: 0 10*3/uL (ref 0.0–0.1)
Basophils Relative: 0 % (ref 0–1)
Eosinophils Absolute: 0 10*3/uL (ref 0.0–0.7)
Eosinophils Relative: 0 % (ref 0–5)
HCT: 29.7 % — ABNORMAL LOW (ref 39.0–52.0)
Hemoglobin: 10.1 g/dL — ABNORMAL LOW (ref 13.0–17.0)
Lymphocytes Relative: 22 % (ref 12–46)
Lymphs Abs: 1.5 10*3/uL (ref 0.7–4.0)
MCH: 30.5 pg (ref 26.0–34.0)
MCHC: 34 g/dL (ref 30.0–36.0)
MCV: 89.7 fL (ref 78.0–100.0)
Monocytes Absolute: 0.8 10*3/uL (ref 0.1–1.0)
Monocytes Relative: 12 % (ref 3–12)
Neutro Abs: 4.5 10*3/uL (ref 1.7–7.7)
Neutrophils Relative %: 66 % (ref 43–77)
Platelets: 202 10*3/uL (ref 150–400)
RBC: 3.31 MIL/uL — ABNORMAL LOW (ref 4.22–5.81)
RDW: 13.4 % (ref 11.5–15.5)
WBC: 6.9 10*3/uL (ref 4.0–10.5)

## 2012-12-18 LAB — COMPREHENSIVE METABOLIC PANEL
ALT: 35 U/L (ref 0–53)
AST: 86 U/L — ABNORMAL HIGH (ref 0–37)
Albumin: 2.7 g/dL — ABNORMAL LOW (ref 3.5–5.2)
Alkaline Phosphatase: 42 U/L (ref 39–117)
BUN: 19 mg/dL (ref 6–23)
Creatinine, Ser: 0.81 mg/dL (ref 0.50–1.35)
GFR calc Af Amer: >90 mL/min (ref >90)
Glucose, Bld: 90 mg/dL (ref 70–99)
Potassium: 3.5 mEq/L (ref 3.5–5.1)
Total Bilirubin: 0.7 mg/dL (ref 0.3–1.2)
Total Protein: 5 g/dL — ABNORMAL LOW (ref 6.0–8.3)

## 2012-12-18 LAB — CK
Total CK: 1436 U/L — ABNORMAL HIGH (ref 7–232)
Total CK: 1234 U/L — ABNORMAL HIGH (ref 7–232)
Total CK: 1110 U/L — ABNORMAL HIGH (ref 7–232)
Total CK: 1004 U/L — ABNORMAL HIGH (ref 7–232)
Total CK: 828 U/L — ABNORMAL HIGH (ref 7–232)

## 2012-12-18 LAB — HEPATITIS PANEL, ACUTE
HCV Ab: REACTIVE — AB
Hep A IgM: NONREACTIVE
Hep B C IgM: NONREACTIVE
Hepatitis B Surface Ag: NEGATIVE

## 2012-12-18 LAB — FOLATE RBC: RBC Folate: 627 ng/mL — ABNORMAL HIGH (ref >280)

## 2012-12-18 LAB — CLOSTRIDIUM DIFFICILE BY PCR: Toxigenic C. Difficile by PCR: NEGATIVE

## 2012-12-18 LAB — MAGNESIUM: Magnesium: 1.9 mg/dL (ref 1.5–2.5)

## 2012-12-18 MED ORDER — ENOXAPARIN SODIUM 40 MG/0.4ML ~~LOC~~ SOLN
40.0000 mg | SUBCUTANEOUS | Status: DC
  Administered 2012-12-18 – 2012-12-19 (×2): 40 mg via SUBCUTANEOUS
  Filled 2012-12-18 (×3): qty 0.4

## 2012-12-18 MED ORDER — AZITHROMYCIN 500 MG PO TABS
500.0000 mg | ORAL_TABLET | Freq: Every day | ORAL | Status: DC
  Administered 2012-12-18 – 2012-12-19 (×2): 500 mg via ORAL
  Filled 2012-12-18 (×3): qty 1

## 2012-12-18 MED ORDER — RISPERIDONE 1 MG PO TABS
1.0000 mg | ORAL_TABLET | Freq: Every day | ORAL | Status: DC
Start: 2012-12-18 — End: 2012-12-20
  Administered 2012-12-18 – 2012-12-19 (×2): 1 mg via ORAL
  Filled 2012-12-18 (×3): qty 1

## 2012-12-18 NOTE — Progress Notes (Signed)
[facility] PROGRESS NOTE  Melchor Kirchgessner ZOX:[id number] DOB: 09/19/1955 DOA: 12/16/2012 PCP: No primary provider on file.  Assessment/Plan:  Acute Encephalopathy - Metabolic versus Functional or Organic, Has a Psych history and records have been requested from [facility] -Psychiatry evaluated patient, see note 12/8 for psychiatry   -HIV; nonreactive -Acute hepatitis panel; hepatitis C virus antibody reactive; obtain HCV viral PCR, - TSH/freeT4; WNL - B12; WNL - folate; High -methylmalonic acid; pending  Acute Renal Failure - Corrected with IVFs; monitor BUN/Cr.   Hyponatremia - Corrected with hydration.  -due to dehydration, should correct with IVFs, Check Urine electrolytes and Urine OSms. And monitor Na+ Levels.   Rhabdomyolysis - Increase normal saline to 250 ml/hr -Recheck CPK levels every 4 hours. -CK dropping should be within normal limits within 24-48 hours   CAP - Continue IV Rocephin and Azithromycin.   Psychotic disorder with delusions  -Per psychiatry start Risperdal 1 mg each bedtime  -Per psychiatry does not exhibit danger to himself or others. He does not meet criteria for involuntary admission -Per officers guarding patient when patient stable will be rearrested and taken to [place]    Code Status: Full Family Communication: None available Disposition Plan: Per psychiatry recommendation   Consultants:  Psychiatry  Procedures: Blood culture 12/17/2012; pending  CXR 12/16/2012 Vague mild right central airspace opacity may reflect atelectasis or  possibly mild pneumonia.  CT head without contrast 12/16/2012 1. No intracranial abnormality.  2. Nasal bone fractures, most likely old    Antibiotics:  Ceftriaxone 12/7>>>  Azithromycin 12/7>>>    HPI/Subjective: Andrew Chaney is a 57 y.o WM, PMHx unknown. Found by the police sleeping outside and when they spoke to him He replied that he was "[employer]". He was talking and not making much  sense and was brought to the ED for an evaluation. He has been rambling on and on, and at times had episodes where he was shaking his lower extremities or his upper extremities. He reports that he just got out of prison, but the police reports that he has been released from [place] 3 days ago. He has not been eating or drinking very well over there past 3 days. He has moments of clarity but has not been forthcoming with his medical history. He has only said that he does not have Bipolar Disorder. An attempt has been made to obtain his medical records from [facility], but the records can not be released until the AM.  In the ED, Psychiatry was consulted, and he was evaluated by the EDP and found to have multiple lab abnormalities so he was referred for admission to medicine. He was evaluated by [facility] and reported that he is suicidal and has a plan to use bed sheets to hang himself and he has been placed on suicide precautions, and a 1:1 sitter has been requested. 12/17/2012 patient A./O. Alfonse Alpers, still unable to inform medical staff of how he actually got here. Does not know where he is, when it is, why he's here. Patient does know name at this time. States just released from [place]. Continues to have nonsensical speech (rambling). TODAY patient sitting comfortably, still confused, unable to understand the need for hydration and other medication. Very paranoid     Objective: Filed Vitals:   12/17/12 2200 12/18/12 0500 12/18/12 1008 12/18/12 1352  BP: 110/64 117/70 107/63 110/65  Pulse: 78 82 69 77  Temp: 98.6 F (37 C) 98.3 F (36.8 C) 98.2 F (36.8 C) 97.6 F (36.4 C)  TempSrc: Oral Oral Oral Oral  Resp: 20 18 18 18   Height:  6' (1.829 m)    Weight:  88.451 kg (195 lb)    SpO2: 99% 100% 97% 99%    Intake/Output Summary (Last 24 hours) at 12/18/12 1927 Last data filed at 12/18/12 1812  Gross per 24 hour  Intake 6190.83 ml  Output   2875 ml  Net 3315.83 ml   Filed Weights   12/18/12  0500  Weight: 88.451 kg (195 lb)    Exam:   General:  A./O. x1, NAD  Cardiovascular: Regular with a rate, negative murmurs rubs gallops, DP/PT pulse one plus bilateral  Respiratory: Clear to auscultation bilateral  Abdomen: Soft, nontender, nondistended, plus bowel sound  Musculoskeletal: Negative pedal edema, negative cyanosis, negative rash except for a malar rash on the face.  Data Reviewed: Basic Metabolic Panel:  Recent Labs Lab 12/16/12 2135 12/17/12 0427 12/18/12 0523  NA 124* 131* 136  K 4.3 3.9 3.5  CL 88* 97 103  CO2 20 22 22   GLUCOSE 128* 113* 90  BUN 77* 60* 19  CREATININE 2.98* 1.88* 0.81  CALCIUM 9.6 8.6 8.4  MG  --   --  1.9   Liver Function Tests:  Recent Labs Lab 12/16/12 2135 12/18/12 0523  AST 242* 86*  ALT 65* 35  ALKPHOS 64 42  BILITOT 1.3* 0.7  PROT 7.0 5.0*  ALBUMIN 4.2 2.7*   No results found for this basename: LIPASE, AMYLASE,  in the last 168 hours No results found for this basename: AMMONIA,  in the last 168 hours CBC:  Recent Labs Lab 12/16/12 2135 12/16/12 2231 12/17/12 0427 12/18/12 0523  WBC 20.4*  --  14.9* 6.9  NEUTROABS  --  16.7*  --  4.5  HGB 12.9*  --  10.6* 10.1*  HCT 35.7*  --  30.6* 29.7*  MCV 85.8  --  86.4 89.7  PLT 233  --  199 202   Cardiac Enzymes:  Recent Labs Lab 12/18/12 0122 12/18/12 0523 12/18/12 0950 12/18/12 1318 12/18/12 1727  CKTOTAL 1436* 1234* 1110* 1004* 828*   BNP (last 3 results) No results found for this basename: PROBNP,  in the last 8760 hours CBG: No results found for this basename: GLUCAP,  in the last 168 hours  Recent Results (from the past 240 hour(s))  CULTURE, BLOOD (ROUTINE X 2)     Status: None   Collection Time    12/17/12  1:07 AM      Result Value Range Status   Specimen Description BLOOD RIGHT HAND   Final   Special Requests BOTTLES DRAWN AEROBIC AND ANAEROBIC 3CC   Final   Culture  Setup Time     Final   Value: 12/17/2012 13:55     Performed at [facility]   Culture     Final   Value:        BLOOD CULTURE RECEIVED NO GROWTH TO DATE CULTURE WILL BE HELD FOR 5 DAYS BEFORE ISSUING A FINAL NEGATIVE REPORT     Performed at [facility]   Report Status PENDING   Incomplete  CLOSTRIDIUM DIFFICILE BY PCR     Status: None   Collection Time    12/17/12  9:42 PM      Result Value Range Status   C difficile by pcr NEGATIVE  NEGATIVE Final   Comment: Performed at [facility]     Studies: Dg Chest 2 View  12/16/2012  CLINICAL DATA:  Hallucinations; remote history of smoking.  EXAM: CHEST  2 VIEW  COMPARISON:  None.  FINDINGS: The lungs are well-aerated. Vague mild right central airspace opacity may reflect atelectasis or possibly mild pneumonia. There is no evidence of pleural effusion or pneumothorax.  The heart is normal in size; the mediastinal contour is within normal limits. No acute osseous abnormalities are seen.  IMPRESSION: Vague mild right central airspace opacity may reflect atelectasis or possibly mild pneumonia.   Electronically Signed   By: Roanna Raider M.D.   On: 12/16/2012 22:58   Ct Head Wo Contrast  12/16/2012   CLINICAL DATA:  Altered mental status.  EXAM: CT HEAD WITHOUT CONTRAST  TECHNIQUE: Contiguous axial images were obtained from the base of the skull through the vertex without intravenous contrast.  COMPARISON:  None.  FINDINGS: Normal appearing cerebral hemispheres and posterior fossa structures. Normal size and position of the ventricles. No intracranial hemorrhage, mass lesion or CT evidence of acute infarction. Nasal bone fractures without overlying soft tissue swelling. These appear somewhat corticated.  IMPRESSION: 1. No intracranial abnormality. 2. Nasal bone fractures, most likely old.   Electronically Signed   By: Gordan Payment M.D.   On: 12/16/2012 22:21    Scheduled Meds: . antiseptic oral rinse  15 mL Mouth Rinse BID  . antiseptic oral rinse  15 mL Mouth Rinse BID  . azithromycin  500 mg Oral  QHS  . cefTRIAXone (ROCEPHIN)  IV  1 g Intravenous Q24H  . enoxaparin (LOVENOX) injection  40 mg Subcutaneous Q24H  . influenza vac split quadrivalent PF  0.5 mL Intramuscular Once  . pneumococcal 23 valent vaccine  0.5 mL Intramuscular Once   Continuous Infusions: . sodium chloride 250 mL/hr at 12/18/12 1436    Principal Problem:   Acute encephalopathy Active Problems:   Renal failure   ARF (acute renal failure)   Hyponatremia   CAP (community acquired pneumonia)   Rhabdomyolysis   Psychotic disorder with delusions    Time spent: 40 minutes   WOODS, CURTIS, J  [facility] Pager [phone]. If 7PM-7AM, please contact night-coverage at [email or web address], password [facility] 12/18/2012, 7:27 PM  LOS: 2 days

## 2012-12-18 NOTE — Progress Notes (Signed)
PHARMACIST - PHYSICIAN COMMUNICATION CONCERNING: Antibiotic IV to Oral Route Change Policy  RECOMMENDATION: This patient is receiving AZITHROMYCIN by the intravenous route.  Based on criteria approved by the Pharmacy and Therapeutics Committee, the antibiotic(s) is/are being converted to the equivalent oral dose form(s).  DESCRIPTION: These criteria include:  Patient being treated for a respiratory tract infection, urinary tract infection, or cellulitis  The patient is not neutropenic and does not exhibit a GI malabsorption state  The patient is eating (either orally or via tube) and/or has been taking other orally administered medications for a least 24 hours  The patient is improving clinically and has a Tmax < 100.5  If you have questions about this conversion, please contact the Pharmacy Department  []   [phone] )  [facility] []   [phone] )  [facility]  []   [phone] )  [facility] [x]   [phone] )  [facility]    Geoffry Paradise, PharmD, [place] Pager: [phone] 11:20 AM Pharmacy #: [phone]

## 2012-12-18 NOTE — Progress Notes (Signed)
Notified Dr. Joseph Art of patients bradycardia to 38-39 when deeply sleeping. Patient is okay and rouses easily when this occurs. Sitter at bedside. When patient wakes his heart rate goes back into the 60s. Placed continuous pulse ox per Dr. Joseph Art order. Erskin Burnet RN

## 2012-12-18 NOTE — Consult Note (Signed)
[facility] Face-to-Face Psychiatry Consult   Reason for Consult:  Psychosis Referring Physician:  Dr Rod Mae Strout is an 57 y.o. male.  Assessment: AXIS I:  Psychotic Disorder NOS AXIS II:  Deferred AXIS III:   Past Medical History  Diagnosis Date  . 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   Supportive therapy provided about ongoing stressors. Discussed crisis plan, support from social network, calling 911, coming to the Emergency Department, and calling Suicide Hotline.  Subjective:   Dennison Mcdaid is a 57 y.o. male patient admitted with high CPK and WBC count.  HPI:  Patient seen chart reviewed.  Patient is a 57 year old Caucasian man who was admitted on the medical floor because of high CPK and WBC count.  Patient was recently released from the prison after serving 77 months for bank robbery.  Initially he was seen by our assessment staff and found to be very psychotic disoriented and preoccupied with his religious thoughts.  He was picked up by increased Police Department.  He mentioned suicidal ideation with plan to hang himself with his bed sheets.  However today patient is more cooperative relevant and denies any suicidal thoughts.  He admitted that he was very mad and upset because police brought him here.  The patient has violated his parole.  Upon release from the prison he supposed to to the halfway house which he did not.  Patient denies any suicidal attempt in the past but admitted he was given lithium Risperdal and Zyprexa when he was in prison.  He also endorsed admitted at [facility] but it was Germ Cell Seminoma.  Patient reported that he has not slept for more than 2 months and does not have adequate food.   His CPK and WBC count was very high.  When I asked why he did not go to halfway house patient told he does not have enough money and he missed his bus.  Patient denies taking any recent psychotropic medication however he will consider Risperdal because it does help sleep.  He denies any hallucination but endorsed some paranoia.  He denies any homicidal thoughts or suicidal thoughts.  He has some flight of ideas however he was relevant in conversation.  He was cooperative and does not exhibit any agitation or aggression.  Patient reported that he is on parole for 3 years. HPI Elements:   Location:  Medical floor. Quality:  fair. Severity:  moderate.  Past Psychiatric History: Past Medical History  Diagnosis Date  . 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 His smoking use included Cigarettes. He smoked 0.00 packs per day. He does not have any smokeless tobacco history on file. He reports that he uses illicit drugs (Marijuana). He reports that he does not drink alcohol. History reviewed. No pertinent family history. Family History Substance Abuse: No Family Supports: No Living Arrangements: Alone Can pt return to current living arrangement?: Yes Abuse/Neglect [facility]) Physical Abuse: Denies Verbal Abuse: Denies Sexual Abuse: Denies Allergies:  No Known Allergies  ACT Assessment Complete:  Yes:    Educational Status    Risk to Self: Risk to self Suicidal Ideation: Yes-Currently Present Suicidal Intent:  Yes-Currently Present Is patient at risk for suicide?: No Suicidal Plan?: Yes-Currently Present Specify Current Suicidal Plan: hang with bed sheets Access to Means: Yes Specify Access to Suicidal Means: sheets in room What has been your use of drugs/alcohol within the last 12 months?: none Previous Attempts/Gestures: No Other Self Harm Risks: N/A Triggers for Past Attempts: Other (Comment) (N/A) Intentional Self Injurious Behavior: None Family Suicide  History: No Recent stressful life event(s): Financial Problems;Legal Issues (Pt released from prison Dec 3 after 77 month sentence.) Persecutory voices/beliefs?: Yes Depression: Yes Depression Symptoms: Insomnia;Feeling worthless/self pity;Feeling angry/irritable Substance abuse history and/or treatment for substance abuse?: Yes  Risk to Others: Risk to Others Homicidal Ideation: No Thoughts of Harm to Others: No Current Homicidal Intent: No Current Homicidal Plan: No Access to Homicidal Means: No Identified Victim: none History of harm to others?: No Assessment of Violence: None Noted Violent Behavior Description: None Does patient have access to weapons?: No Criminal Charges Pending?: No (Pt on parole for 3 years.  ) Does patient have a court date: No  Abuse: Abuse/Neglect Assessment (Assessment to be complete while patient is alone) Physical Abuse: Denies Verbal Abuse: Denies Sexual Abuse: Denies Exploitation of patient/patient's resources: Denies Self-Neglect: Denies  Prior Inpatient Therapy: Prior Inpatient Therapy Prior Inpatient Therapy: Yes Prior Therapy Facilty/Provider(s): Pt released from [facility] Dec 3. States that he had a psychiatrist there.  Reason for Treatment: Imprisoned at the time.    Prior Outpatient Therapy: Prior Outpatient Therapy Prior Outpatient Therapy: No Prior Therapy Facilty/Provider(s): N/A Reason for Treatment: N/A  Additional Information: Additional Information 1:1 In Past 12 Months?: No CIRT Risk: No Elopement Risk: No                  Objective: Blood pressure 110/65, pulse 77, temperature 97.6 F (36.4 C), temperature source Oral, resp. rate 18, height 6' (1.829 m), weight 195 lb (88.451 kg), SpO2 99.00%.Body mass index is 26.44 kg/(m^2). Results for orders placed during the hospital encounter of 12/16/12 (from the past 72 hour(s))  ACETAMINOPHEN LEVEL     Status: None   Collection Time    12/16/12  9:35 PM      Result  Value Range   Acetaminophen (Tylenol), Serum <15.0  10 - 30 ug/mL   Comment:            THERAPEUTIC CONCENTRATIONS VARY     SIGNIFICANTLY. A RANGE OF 10-30     ug/mL MAY BE AN EFFECTIVE     CONCENTRATION FOR MANY PATIENTS.     HOWEVER, SOME ARE BEST TREATED     AT CONCENTRATIONS OUTSIDE THIS     RANGE.     ACETAMINOPHEN CONCENTRATIONS     >150 ug/mL AT 4 HOURS AFTER     INGESTION AND >50 ug/mL AT 12     HOURS AFTER INGESTION ARE     OFTEN ASSOCIATED WITH TOXIC     REACTIONS.  CBC     Status: Abnormal   Collection Time    12/16/12  9:35 PM      Result Value Range   WBC 20.4 (*) 4.0 - 10.5 K/uL   RBC 4.16 (*) 4.22 - 5.81 MIL/uL   Hemoglobin 12.9 (*) 13.0 - 17.0 g/dL   HCT 40.9 (*) 81.1 - 91.4 %   MCV 85.8  78.0 - 100.0 fL   MCH 31.0  26.0 - 34.0 pg   MCHC 36.1 (*) 30.0 - 36.0 g/dL   RDW 78.2  95.6 - 21.3 %  Platelets 233  150 - 400 K/uL  COMPREHENSIVE METABOLIC PANEL     Status: Abnormal   Collection Time    12/16/12  9:35 PM      Result Value Range   Sodium 124 (*) 135 - 145 mEq/L   Potassium 4.3  3.5 - 5.1 mEq/L   Chloride 88 (*) 96 - 112 mEq/L   CO2 20  19 - 32 mEq/L   Glucose, Bld 128 (*) 70 - 99 mg/dL   BUN 77 (*) 6 - 23 mg/dL   Creatinine, Ser 1.61 (*) 0.50 - 1.35 mg/dL   Calcium 9.6  8.4 - 09.6 mg/dL   Total Protein 7.0  6.0 - 8.3 g/dL   Albumin 4.2  3.5 - 5.2 g/dL   AST 045 (*) 0 - 37 U/L   ALT 65 (*) 0 - 53 U/L   Alkaline Phosphatase 64  39 - 117 U/L   Total Bilirubin 1.3 (*) 0.3 - 1.2 mg/dL   GFR calc non Af Amer 22 (*) >90 mL/min   GFR calc Af Amer 26 (*) >90 mL/min   Comment: (NOTE)     The eGFR has been calculated using the CKD EPI equation.     This calculation has not been validated in all clinical situations.     eGFR's persistently <90 mL/min signify possible Chronic Kidney     Disease.  ETHANOL     Status: None   Collection Time    12/16/12  9:35 PM      Result Value Range   Alcohol, Ethyl (B) <11  0 - 11 mg/dL   Comment:            LOWEST  DETECTABLE LIMIT FOR     SERUM ALCOHOL IS 11 mg/dL     FOR MEDICAL PURPOSES ONLY  SALICYLATE LEVEL     Status: Abnormal   Collection Time    12/16/12  9:35 PM      Result Value Range   Salicylate Lvl <2.0 (*) 2.8 - 20.0 mg/dL  CK     Status: Abnormal   Collection Time    12/16/12  9:46 PM      Result Value Range   Total CK 6465 (*) 7 - 232 U/L  DIFFERENTIAL     Status: Abnormal   Collection Time    12/16/12 10:31 PM      Result Value Range   Neutrophils Relative % 87 (*) 43 - 77 %   Neutro Abs 16.7 (*) 1.7 - 7.7 K/uL   Lymphocytes Relative 5 (*) 12 - 46 %   Lymphs Abs 0.9  0.7 - 4.0 K/uL   Monocytes Relative 9  3 - 12 %   Monocytes Absolute 1.7 (*) 0.1 - 1.0 K/uL   Eosinophils Relative 0  0 - 5 %   Eosinophils Absolute 0.0  0.0 - 0.7 K/uL   Basophils Relative 0  0 - 1 %   Basophils Absolute 0.0  0.0 - 0.1 K/uL  CG4 I-STAT (LACTIC ACID)     Status: None   Collection Time    12/17/12 12:27 AM      Result Value Range   Lactic Acid, Venous 1.04  0.5 - 2.2 mmol/L  CULTURE, BLOOD (ROUTINE X 2)     Status: None   Collection Time    12/17/12  1:07 AM      Result Value Range   Specimen Description BLOOD RIGHT HAND     Special Requests BOTTLES DRAWN AEROBIC  AND ANAEROBIC 3CC     Culture  Setup Time       Value: 12/17/2012 13:55     Performed at [facility]   Culture       Value:        BLOOD CULTURE RECEIVED NO GROWTH TO DATE CULTURE WILL BE HELD FOR 5 DAYS BEFORE ISSUING A FINAL NEGATIVE REPORT     Performed at [facility]   Report Status PENDING    URINE RAPID DRUG SCREEN (HOSP PERFORMED)     Status: None   Collection Time    12/17/12  1:16 AM      Result Value Range   Opiates NONE DETECTED  NONE DETECTED   Cocaine NONE DETECTED  NONE DETECTED   Benzodiazepines NONE DETECTED  NONE DETECTED   Amphetamines NONE DETECTED  NONE DETECTED   Tetrahydrocannabinol NONE DETECTED  NONE DETECTED   Barbiturates NONE DETECTED  NONE DETECTED   Comment:             DRUG SCREEN FOR MEDICAL PURPOSES     ONLY.  IF CONFIRMATION IS NEEDED     FOR ANY PURPOSE, NOTIFY LAB     WITHIN 5 DAYS.                LOWEST DETECTABLE LIMITS     FOR URINE DRUG SCREEN     Drug Class       Cutoff (ng/mL)     Amphetamine      1000     Barbiturate      200     Benzodiazepine   200     Tricyclics       300     Opiates          300     Cocaine          300     THC              50  URINALYSIS, ROUTINE W REFLEX MICROSCOPIC     Status: Abnormal   Collection Time    12/17/12  1:16 AM      Result Value Range   Color, Urine YELLOW  YELLOW   APPearance CLOUDY (*) CLEAR   Specific Gravity, Urine 1.016  1.005 - 1.030   pH 5.0  5.0 - 8.0   Glucose, UA NEGATIVE  NEGATIVE mg/dL   Hgb urine dipstick MODERATE (*) NEGATIVE   Bilirubin Urine NEGATIVE  NEGATIVE   Ketones, ur NEGATIVE  NEGATIVE mg/dL   Protein, ur NEGATIVE  NEGATIVE mg/dL   Urobilinogen, UA 0.2  0.0 - 1.0 mg/dL   Nitrite NEGATIVE  NEGATIVE   Leukocytes, UA NEGATIVE  NEGATIVE  URINE MICROSCOPIC-ADD ON     Status: Abnormal   Collection Time    12/17/12  1:16 AM      Result Value Range   WBC, UA 0-2  <3 WBC/hpf   RBC / HPF 0-2  <3 RBC/hpf   Bacteria, UA RARE  RARE   Casts GRANULAR CAST (*) NEGATIVE  BASIC METABOLIC PANEL     Status: Abnormal   Collection Time    12/17/12  4:27 AM      Result Value Range   Sodium 131 (*) 135 - 145 mEq/L   Comment: DELTA CHECK NOTED   Potassium 3.9  3.5 - 5.1 mEq/L   Chloride 97  96 - 112 mEq/L   Comment: DELTA CHECK NOTED   CO2 22  19 - 32 mEq/L  Glucose, Bld 113 (*) 70 - 99 mg/dL   BUN 60 (*) 6 - 23 mg/dL   Creatinine, Ser 1.47 (*) 0.50 - 1.35 mg/dL   Comment: DELTA CHECK NOTED   Calcium 8.6  8.4 - 10.5 mg/dL   GFR calc non Af Amer 39 (*) >90 mL/min   GFR calc Af Amer 45 (*) >90 mL/min   Comment: (NOTE)     The eGFR has been calculated using the CKD EPI equation.     This calculation has not been validated in all clinical situations.     eGFR's persistently <90  mL/min signify possible Chronic Kidney     Disease.  CBC     Status: Abnormal   Collection Time    12/17/12  4:27 AM      Result Value Range   WBC 14.9 (*) 4.0 - 10.5 K/uL   RBC 3.54 (*) 4.22 - 5.81 MIL/uL   Hemoglobin 10.6 (*) 13.0 - 17.0 g/dL   Comment: DELTA CHECK NOTED     REPEATED TO VERIFY   HCT 30.6 (*) 39.0 - 52.0 %   MCV 86.4  78.0 - 100.0 fL   MCH 29.9  26.0 - 34.0 pg   MCHC 34.6  30.0 - 36.0 g/dL   RDW 82.9  56.2 - 13.0 %   Platelets 199  150 - 400 K/uL  NA AND K (SODIUM & POTASSIUM), RAND UR     Status: None   Collection Time    12/17/12  7:32 AM      Result Value Range   Sodium, Ur <15     Comment: Result repeated and verified.   Potassium Urine Timed 30     Comment: Performed at [facility]  OSMOLALITY, URINE     Status: Abnormal   Collection Time    12/17/12  7:32 AM      Result Value Range   Osmolality, Ur 368 (*) 390 - 1090 mOsm/kg   Comment: Performed at [facility]  CK     Status: Abnormal   Collection Time    12/17/12  2:15 PM      Result Value Range   Total CK 2180 (*) 7 - 232 U/L  CK     Status: Abnormal   Collection Time    12/17/12  5:42 PM      Result Value Range   Total CK 1727 (*) 7 - 232 U/L  RAPID HIV SCREEN ([facility])     Status: None   Collection Time    12/17/12  5:42 PM      Result Value Range   SUDS Rapid HIV Screen NON REACTIVE  NON REACTIVE   Comment: RESULT CALLED TO, READ BACK BY AND VERIFIED WITH:     Brynda Greathouse [id number] @ 1833 BY J SCOTTON  HEPATITIS PANEL, ACUTE     Status: Abnormal   Collection Time    12/17/12  5:42 PM      Result Value Range   Hepatitis B Surface Ag NEGATIVE  NEGATIVE   HCV Ab Reactive (*) NEGATIVE   Comment: (NOTE)  This test is for screening purposes only.  Reactive results should be     confirmed by an alternative method.  Suggest HCV Qualitative, PCR,     test code 13244.  Specimens will be stable  for reflex testing up to 3     days after collection.   Hep A IgM NON REACTIVE  NON REACTIVE   Hep B C IgM NON REACTIVE  NON REACTIVE   Comment: (NOTE)     High levels of Hepatitis B Core IgM antibody are detectable     during the acute stage of Hepatitis B. This antibody is used     to differentiate current from past HBV infection.     Performed at [facility]  TSH     Status: None   Collection Time    12/17/12  5:42 PM      Result Value Range   TSH 0.802  0.350 - 4.500 uIU/mL   Comment: Performed at [facility]  T4, FREE     Status: None   Collection Time    12/17/12  5:42 PM      Result Value Range   Free T4 1.45  0.80 - 1.80 ng/dL   Comment: Performed at [facility]  VITAMIN B12     Status: None   Collection Time    12/17/12  5:42 PM      Result Value Range   Vitamin B-12 621  211 - 911 pg/mL   Comment: Performed at [facility]  FOLATE RBC     Status: Abnormal   Collection Time    12/17/12  5:42 PM      Result Value Range   RBC Folate 627 (*) >280 ng/mL   Comment: Reference range not established for pediatric patients.     Performed at [facility]  CLOSTRIDIUM DIFFICILE BY PCR     Status: None   Collection Time    12/17/12  9:42 PM      Result Value Range   C difficile by pcr NEGATIVE  NEGATIVE   Comment: Performed at [facility]  CK     Status: Abnormal   Collection Time    12/17/12  9:55 PM      Result Value Range   Total CK 1521 (*) 7 - 232 U/L  CK     Status: Abnormal   Collection Time    12/18/12  1:22 AM      Result Value Range   Total CK 1436 (*) 7 - 232 U/L  CK     Status: Abnormal   Collection Time    12/18/12  5:23 AM      Result Value Range   Total CK 1234 (*) 7 - 232 U/L  COMPREHENSIVE METABOLIC PANEL     Status: Abnormal   Collection Time    12/18/12  5:23 AM      Result Value Range   Sodium 136  135 - 145 mEq/L   Potassium 3.5  3.5 - 5.1 mEq/L   Chloride 103  96 - 112 mEq/L   CO2 22   19 - 32 mEq/L   Glucose, Bld 90  70 - 99 mg/dL   BUN 19  6 - 23 mg/dL   Comment: DELTA CHECK NOTED     REPEATED TO VERIFY   Creatinine, Ser 0.81  0.50 - 1.35 mg/dL   Comment: DELTA CHECK NOTED     REPEATED TO VERIFY   Calcium 8.4  8.4 - 10.5 mg/dL   Total Protein 5.0 (*) 6.0 - 8.3 g/dL   Albumin 2.7 (*) 3.5 - 5.2 g/dL   AST 86 (*) 0 - 37 U/L   ALT 35  0 - 53 U/L   Alkaline Phosphatase 42  39 - 117 U/L   Total Bilirubin 0.7  0.3 - 1.2 mg/dL   GFR calc non Af Amer >90  >90 mL/min   GFR calc Af Amer >90  >90 mL/min   Comment: (NOTE)     The eGFR has been calculated using the CKD EPI equation.     This calculation has not been validated in all clinical situations.     eGFR's persistently <90 mL/min signify possible Chronic Kidney     Disease.  CBC WITH DIFFERENTIAL     Status: Abnormal   Collection Time    12/18/12  5:23 AM      Result Value Range   WBC 6.9  4.0 - 10.5 K/uL   RBC 3.31 (*) 4.22 - 5.81 MIL/uL   Hemoglobin 10.1 (*) 13.0 - 17.0 g/dL   HCT 16.1 (*) 09.6 - 04.5 %   MCV 89.7  78.0 - 100.0 fL   MCH 30.5  26.0 - 34.0 pg   MCHC 34.0  30.0 - 36.0 g/dL   RDW 40.9  81.1 - 91.4 %   Platelets 202  150 - 400 K/uL   Neutrophils Relative % 66  43 - 77 %   Neutro Abs 4.5  1.7 - 7.7 K/uL   Lymphocytes Relative 22  12 - 46 %   Lymphs Abs 1.5  0.7 - 4.0 K/uL   Monocytes Relative 12  3 - 12 %   Monocytes Absolute 0.8  0.1 - 1.0 K/uL   Eosinophils Relative 0  0 - 5 %   Eosinophils Absolute 0.0  0.0 - 0.7 K/uL   Basophils Relative 0  0 - 1 %   Basophils Absolute 0.0  0.0 - 0.1 K/uL  MAGNESIUM     Status: None   Collection Time    12/18/12  5:23 AM      Result Value Range   Magnesium 1.9  1.5 - 2.5 mg/dL  CK     Status: Abnormal   Collection Time    12/18/12  9:50 AM      Result Value Range   Total CK 1110 (*) 7 - 232 U/L  CK     Status: Abnormal   Collection Time    12/18/12  1:18 PM      Result Value Range   Total CK 1004 (*) 7 - 232 U/L   Labs are reviewed and are  pertinent for hypoglycemia count and CPK.  Current Facility-Administered Medications  Medication Dose Route Frequency Provider Last Rate Last Dose  . 0.9 %  sodium chloride infusion   Intravenous Continuous Drema Dallas, MD 250 mL/hr at 12/18/12 1436    . acetaminophen (TYLENOL) tablet 650 mg  650 mg Oral Q6H PRN Ron Parker, MD       Or  . acetaminophen (TYLENOL) suppository 650 mg  650 mg Rectal Q6H PRN Ron Parker, MD      . alum & mag hydroxide-simeth (MAALOX/MYLANTA) 200-200-20 MG/5ML suspension 30 mL  30 mL Oral Q6H PRN Ron Parker, MD      . antiseptic oral rinse (BIOTENE) solution 15 mL  15 mL Mouth Rinse BID Ron Parker, MD      . antiseptic  oral rinse (BIOTENE) solution 15 mL  15 mL Mouth Rinse BID Ron Parker, MD   15 mL at 12/17/12 0908  . azithromycin (ZITHROMAX) tablet 500 mg  500 mg Oral QHS Theda Sers, RPH      . cefTRIAXone (ROCEPHIN) 1 g in dextrose 5 % 50 mL IVPB  1 g Intravenous Q24H Ron Parker, MD   1 g at 12/18/12 0031  . enoxaparin (LOVENOX) injection 40 mg  40 mg Subcutaneous Q24H Drema Dallas, MD   40 mg at 12/18/12 1022  . HYDROmorphone (DILAUDID) injection 0.5-1 mg  0.5-1 mg Intravenous Q3H PRN Ron Parker, MD      . influenza vac split quadrivalent PF (FLUARIX) injection 0.5 mL  0.5 mL Intramuscular Once Ron Parker, MD      . ondansetron [facility]) tablet 4 mg  4 mg Oral Q6H PRN Ron Parker, MD       Or  . ondansetron (ZOFRAN) injection 4 mg  4 mg Intravenous Q6H PRN Ron Parker, MD      . oxyCODONE (Oxy IR/ROXICODONE) immediate release tablet 5 mg  5 mg Oral Q4H PRN Ron Parker, MD   5 mg at 12/17/12 2010  . pneumococcal 23 valent vaccine (PNU-IMMUNE) injection 0.5 mL  0.5 mL Intramuscular Once Ron Parker, MD      . zolpidem (AMBIEN) tablet 5 mg  5 mg Oral QHS PRN Ron Parker, MD        Psychiatric Specialty Exam:     Blood pressure 110/65, pulse 77,  temperature 97.6 F (36.4 C), temperature source Oral, resp. rate 18, height 6' (1.829 m), weight 195 lb (88.451 kg), SpO2 99.00%.Body mass index is 26.44 kg/(m^2).  General Appearance: Disheveled and Guarded  Eye Solicitor::  Fair  Speech:  Slow  Volume:  Decreased  Mood:  Irritable  Affect:  Congruent  Thought Process:  Loose  Orientation:  Full (Time, Place, and Person)  Thought Content:  He has paranoia but no delusions  Suicidal Thoughts:  No  Homicidal Thoughts:  No  Memory:  Alert and oriented x3  Judgement:  Fair  Insight:  Fair  Psychomotor Activity:  Decreased  Concentration:  Fair  Recall:  Fair  Akathisia:  No  Handed:  Right  AIMS (if indicated):     Assets:  Communication Skills Desire for Improvement  Sleep:      Treatment Plan Summary: Medication management Patient's psychosis is getting better.  He is improving with hydration.  He will consider Risperdal 1 mg at bedtime because it helps sleep.  Patient does not exhibit danger to himself or others.  He does not meet criteria for involuntary admission.  He denies any suicidal thoughts or homicidal thoughts.  He admitted that he was mad and upset because police brought him here.  Start Risperdal 1 mg at bedtime if not medically contraindicated.  Call [phone] if you have any further questions.  ARFEEN,SYED T. 12/18/2012 5:22 PM

## 2012-12-19 ENCOUNTER — Inpatient Hospital Stay (HOSPITAL_COMMUNITY)

## 2012-12-19 LAB — COMPREHENSIVE METABOLIC PANEL
ALT: 30 U/L (ref 0–53)
AST: 54 U/L — ABNORMAL HIGH (ref 0–37)
Albumin: 2.4 g/dL — ABNORMAL LOW (ref 3.5–5.2)
Alkaline Phosphatase: 45 U/L (ref 39–117)
CO2: 24 mEq/L (ref 19–32)
Calcium: 8.1 mg/dL — ABNORMAL LOW (ref 8.4–10.5)
Chloride: 107 mEq/L (ref 96–112)
Creatinine, Ser: 0.64 mg/dL (ref 0.50–1.35)
GFR calc non Af Amer: >90 mL/min (ref >90)
Potassium: 3 mEq/L — ABNORMAL LOW (ref 3.5–5.1)
Total Bilirubin: 0.4 mg/dL (ref 0.3–1.2)

## 2012-12-19 LAB — CBC WITH DIFFERENTIAL/PLATELET
Basophils Absolute: 0 10*3/uL (ref 0.0–0.1)
Basophils Relative: 0 % (ref 0–1)
Eosinophils Absolute: 0.1 10*3/uL (ref 0.0–0.7)
Eosinophils Relative: 2 % (ref 0–5)
Lymphs Abs: 1.9 10*3/uL (ref 0.7–4.0)
MCH: 30.3 pg (ref 26.0–34.0)
MCHC: 33.8 g/dL (ref 30.0–36.0)
MCV: 89.6 fL (ref 78.0–100.0)
Neutrophils Relative %: 51 % (ref 43–77)
Platelets: 221 10*3/uL (ref 150–400)
RBC: 3.47 MIL/uL — ABNORMAL LOW (ref 4.22–5.81)
RDW: 13.2 % (ref 11.5–15.5)

## 2012-12-19 LAB — MAGNESIUM: Magnesium: 1.6 mg/dL (ref 1.5–2.5)

## 2012-12-19 LAB — CK
Total CK: 553 U/L — ABNORMAL HIGH (ref 7–232)
Total CK: 519 U/L — ABNORMAL HIGH (ref 7–232)
Total CK: 398 U/L — ABNORMAL HIGH (ref 7–232)
Total CK: 358 U/L — ABNORMAL HIGH (ref 7–232)

## 2012-12-19 LAB — HEPATITIS C VRS RNA DETECT BY PCR-QUAL: Hepatitis C Vrs RNA by PCR-Qual: POSITIVE — AB

## 2012-12-19 NOTE — Progress Notes (Signed)
Clinical Social Work Department CLINICAL SOCIAL WORK PSYCHIATRY SERVICE LINE ASSESSMENT 12/19/2012  Patient:  Andrew Chaney  Account:  [id number]  Admit Date:  12/16/2012  Clinical Social Worker:  Unk Lightning, LCSW  Date/Time:  12/19/2012 03:30 PM Referred by:  Physician  Date referred:  12/19/2012 Reason for Referral  Psychosocial assessment   Presenting Symptoms/Problems (In the person's/family's own words):   Psych consulted due to psychosis   Abuse/Neglect/Trauma History (check all that apply)  Denies history   Abuse/Neglect/Trauma Comments:   Psychiatric History (check all that apply)  Denies history   Psychiatric medications:  Risperdal 1 mg   Current Mental Health Hospitalizations/Previous Mental Health History:   Patient reports he was diagnosed with bipolar in 1998. Patient reports no current MH treatment.   Current provider:   None   Place and Date:   N/A   Current Medications:   Scheduled Meds:      . antiseptic oral rinse  15 mL Mouth Rinse BID  . antiseptic oral rinse  15 mL Mouth Rinse BID  . azithromycin  500 mg Oral QHS  . cefTRIAXone (ROCEPHIN)  IV  1 g Intravenous Q24H  . enoxaparin (LOVENOX) injection  40 mg Subcutaneous Q24H  . influenza vac split quadrivalent PF  0.5 mL Intramuscular Once  . pneumococcal 23 valent vaccine  0.5 mL Intramuscular Once  . risperiDONE  1 mg Oral QHS        Continuous Infusions:      . sodium chloride 250 mL/hr at 12/19/12 1137          PRN Meds:.acetaminophen, acetaminophen, alum & mag hydroxide-simeth, HYDROmorphone (DILAUDID) injection, ondansetron (ZOFRAN) IV, ondansetron, oxyCODONE, zolpidem       Previous Impatient Admission/Date/Reason:   Patient denies any previous hospitalizations.   Emotional Health / Current Symptoms    Suicide/Self Harm  None reported   Suicide attempt in the past:   Patient denies any SI or HI. Patient reports he does not remember making statements about wanting to harm himself.  Patient contracts for safety at this time.   Other harmful behavior:   None reported   Psychotic/Dissociative Symptoms  None reported   Other Psychotic/Dissociative Symptoms:   Patient denies any hallucinations at this time. Patient reports he was confused at admission but feels better at this time and has not experienced any psychosis.    Attention/Behavioral Symptoms  Withdrawn   Other Attention / Behavioral Symptoms:   Patient minimally engaged throughout assessment.    Cognitive Impairment  Within Normal Limits   Other Cognitive Impairment:   Patient alert and oriented during assessment.    Mood and Adjustment  Flat    Stress, Anxiety, Trauma, Any Recent Loss/Stressor  Current Legal Problems/Pending Court Date   Anxiety (frequency):   N/A   Phobia (specify):   N/A   Compulsive behavior (specify):   N/A   Obsessive behavior (specify):   N/A   Other:   Patient being guarded by police and will be DC to prison at [place].   Substance Abuse/Use  None   SBIRT completed (please refer for detailed history):  N  Self-reported substance use:   Patient denies all substance use.   Urinary Drug Screen Completed:  Y Alcohol level:   <11    Environmental/Housing/Living Arrangement  HOMELESS   Who is in the home:   Homeless   Emergency contact:  None given   Financial  IPRS   Patient's Strengths and Goals (patient's own words):   Patient is  aware of MH diagnosis and aware that treatment is needed. Patient reports he has been compliant with medications while hospitalized.   Clinical Social Worker's Interpretive Summary:   CSW received referral to complete psychosocial assessment. CSW reviewed chart and met with patient at bedside. CSW introduced myself and explained role.    Patient reports he is unsure why he was admitted to the hospital and reports he was confused on admission. Per chart review, patient hallucinating and reported SI. Patient reports he  is now alert and oriented and denies any SI. Patient contracts for safety and reports the police are watching him because he has to go to prison when DC.    Patient reports that he is homeless and does not have an informal support system. Patient reports that his parents have passed away and he has never been married or have children. Patient reports that he does not have a home and has lived all over. Patient is not upset about returning to prison at DC.    Patient reports he was diagnosed with bipolar in 1998 but has not received any treatment for MH concerns. Patient is unsure of symptoms but reports that he will take medication that is prescribed for him. Patient reports that he has never followed up on an outpatient basis but will receive whatever care via prison.    Patient is very guarded throughout assessment. Patient avoids eye contact and gives short answers. Patient does not elaborate on questions asked and is not open to discussing past.    Psych MD recommends medication changes but that patient does not meet IVC criteria. CSW will continue to follow.   Disposition:  Recommend Psych CSW continuing to support while in hospital   [place], [place] [phone]

## 2012-12-19 NOTE — Progress Notes (Signed)
[facility] PROGRESS NOTE  Buel Molder ZOX:[id number] DOB: 1955-05-18 DOA: 12/16/2012 PCP: No primary provider on file.  Assessment/Plan:  Acute Encephalopathy - Metabolic versus Functional or Organic, Has a Psych history and records have been requested from [facility] -Psychiatry evaluated patient, see note 12/8 for psychiatry   -HIV; nonreactive -Acute hepatitis panel; hepatitis C virus antibody reactive; obtain HCV viral PCR, (positive) my: Patient will need referral to a hepatologist after he is released from prison - TSH/freeT4; WNL - B12; WNL - folate; High -methylmalonic acid; WNL  Acute Renal Failure - Corrected with IVFs; monitor BUN/Cr.   Hyponatremia - Corrected with hydration.     Rhabdomyolysis - Continue normal saline to 250 ml/hr -Continue Recheck CPK levels every 4 hours. Patient to be discharged to [facility] upon normalization -CK dropping but remains high; current CK=519 U/L -CK dropping should be within normal limits within 24-48 hours   CAP - Continue IV Rocephin and Azithromycin -Obtain PCXR; review for interval change    Psychotic disorder with delusions  -Per psychiatry start Risperdal 1 mg each bedtime  -Per psychiatry does not exhibit danger to himself or others. He does not meet criteria for involuntary admission -Per officers guarding patient when patient stable will be rearrested and taken to [place]    Code Status: Full Family Communication: None available Disposition Plan: Per psychiatry recommendation   Consultants:  Psychiatry  Procedures: Blood culture 12/17/2012; pending  CXR 12/16/2012 Vague mild right central airspace opacity may reflect atelectasis or  possibly mild pneumonia.  CT head without contrast 12/16/2012 1. No intracranial abnormality.  2. Nasal bone fractures, most likely old    Antibiotics:  Ceftriaxone 12/7>>>  Azithromycin 12/7>>>    HPI/Subjective: Andrew Chaney is a 57 y.o WM, PMHx unknown. Found  by the police sleeping outside and when they spoke to him He replied that he was "[employer]". He was talking and not making much sense and was brought to the ED for an evaluation. He has been rambling on and on, and at times had episodes where he was shaking his lower extremities or his upper extremities. He reports that he just got out of prison, but the police reports that he has been released from [place] 3 days ago. He has not been eating or drinking very well over there past 3 days. He has moments of clarity but has not been forthcoming with his medical history. He has only said that he does not have Bipolar Disorder. An attempt has been made to obtain his medical records from [place], but the records can not be released until the AM.  In the ED, Psychiatry was consulted, and he was evaluated by the EDP and found to have multiple lab abnormalities so he was referred for admission to medicine. He was evaluated by [facility] and reported that he is suicidal and has a plan to use bed sheets to hang himself and he has been placed on suicide precautions, and a 1:1 sitter has been requested. 12/17/2012 patient A./O. Alfonse Alpers, still unable to inform medical staff of how he actually got here. Does not know where he is, when it is, why he's here. Patient does know name at this time. States just released from [place]. Continues to have nonsensical speech (rambling). 12/18/2012 patient sitting comfortably, still confused, unable to understand the need for hydration and other medication. Very paranoid TODAY complains of SOB, negative CP. SpO2 on room air= 100%     Objective: Filed Vitals:   12/18/12 2100 12/18/12 2156  12/19/12 0216 12/19/12 0604  BP:  105/51 107/59 106/69  Pulse:  78 68 71  Temp:  98.2 F (36.8 C) 98.7 F (37.1 C) 98.4 F (36.9 C)  TempSrc:  Oral Oral Oral  Resp:  20 20 18   Height:      Weight:      SpO2: 96% 95% 92% 94%    Intake/Output Summary (Last 24 hours) at 12/19/12 1008 Last  data filed at 12/19/12 1610  Gross per 24 hour  Intake   5260 ml  Output   4225 ml  Net   1035 ml   Filed Weights   12/18/12 0500  Weight: 88.451 kg (195 lb)    Exam:   General:  A./O. x1, NAD  Cardiovascular: Regular rhythm and rate, negative murmurs rubs gallops, DP/PT pulse one plus bilateral  Respiratory: Clear to auscultation bilateral  Abdomen: Soft, nontender, nondistended, plus bowel sound  Musculoskeletal: Negative pedal edema, negative cyanosis, negative rash except for a malar rash on the face.  Data Reviewed: Basic Metabolic Panel:  Recent Labs Lab 12/16/12 2135 12/17/12 0427 12/18/12 0523 12/19/12 0430  NA 124* 131* 136 140  K 4.3 3.9 3.5 3.0*  CL 88* 97 103 107  CO2 20 22 22 24   GLUCOSE 128* 113* 90 104*  BUN 77* 60* 19 7  CREATININE 2.98* 1.88* 0.81 0.64  CALCIUM 9.6 8.6 8.4 8.1*  MG  --   --  1.9 1.6   Liver Function Tests:  Recent Labs Lab 12/16/12 2135 12/18/12 0523 12/19/12 0430  AST 242* 86* 54*  ALT 65* 35 30  ALKPHOS 64 42 45  BILITOT 1.3* 0.7 0.4  PROT 7.0 5.0* 4.7*  ALBUMIN 4.2 2.7* 2.4*   No results found for this basename: LIPASE, AMYLASE,  in the last 168 hours No results found for this basename: AMMONIA,  in the last 168 hours CBC:  Recent Labs Lab 12/16/12 2135 12/16/12 2231 12/17/12 0427 12/18/12 0523 12/19/12 0430  WBC 20.4*  --  14.9* 6.9 5.7  NEUTROABS  --  16.7*  --  4.5 2.9  HGB 12.9*  --  10.6* 10.1* 10.5*  HCT 35.7*  --  30.6* 29.7* 31.1*  MCV 85.8  --  86.4 89.7 89.6  PLT 233  --  199 202 221   Cardiac Enzymes:  Recent Labs Lab 12/18/12 1318 12/18/12 1727 12/18/12 2111 12/19/12 0210 12/19/12 0430  CKTOTAL 1004* 828* 723* 553* 519*   BNP (last 3 results) No results found for this basename: PROBNP,  in the last 8760 hours CBG: No results found for this basename: GLUCAP,  in the last 168 hours  Recent Results (from the past 240 hour(s))  CULTURE, BLOOD (ROUTINE X 2)     Status: None    Collection Time    12/17/12 12:57 AM      Result Value Range Status   Specimen Description BLOOD LEFT ARM   Final   Special Requests BOTTLES DRAWN AEROBIC AND ANAEROBIC 6CC   Final   Culture  Setup Time     Final   Value: 12/18/2012 14:39     Performed at [facility]   Culture     Final   Value:        BLOOD CULTURE RECEIVED NO GROWTH TO DATE CULTURE WILL BE HELD FOR 5 DAYS BEFORE ISSUING A FINAL NEGATIVE REPORT     Performed at [facility]   Report Status PENDING   Incomplete  CULTURE, BLOOD (ROUTINE X  2)     Status: None   Collection Time    12/17/12  1:07 AM      Result Value Range Status   Specimen Description BLOOD RIGHT HAND   Final   Special Requests BOTTLES DRAWN AEROBIC AND ANAEROBIC 3CC   Final   Culture  Setup Time     Final   Value: 12/17/2012 13:55     Performed at [facility]   Culture     Final   Value:        BLOOD CULTURE RECEIVED NO GROWTH TO DATE CULTURE WILL BE HELD FOR 5 DAYS BEFORE ISSUING A FINAL NEGATIVE REPORT     Performed at [facility]   Report Status PENDING   Incomplete  CLOSTRIDIUM DIFFICILE BY PCR     Status: None   Collection Time    12/17/12  9:42 PM      Result Value Range Status   C difficile by pcr NEGATIVE  NEGATIVE Final   Comment: Performed at [facility]     Studies: No results found.  Scheduled Meds: . antiseptic oral rinse  15 mL Mouth Rinse BID  . antiseptic oral rinse  15 mL Mouth Rinse BID  . azithromycin  500 mg Oral QHS  . cefTRIAXone (ROCEPHIN)  IV  1 g Intravenous Q24H  . enoxaparin (LOVENOX) injection  40 mg Subcutaneous Q24H  . influenza vac split quadrivalent PF  0.5 mL Intramuscular Once  . pneumococcal 23 valent vaccine  0.5 mL Intramuscular Once  . risperiDONE  1 mg Oral QHS   Continuous Infusions: . sodium chloride 250 mL/hr at 12/19/12 1610    Principal Problem:   Acute encephalopathy Active Problems:   Renal failure   ARF (acute renal failure)   Hyponatremia    CAP (community acquired pneumonia)   Rhabdomyolysis   Psychotic disorder with delusions    Time spent: 40 minutes   WOODS, CURTIS, J  [facility] Pager [phone]. If 7PM-7AM, please contact night-coverage at [email or web address], password [facility] 12/19/2012, 10:08 AM  LOS: 3 days

## 2012-12-20 DIAGNOSIS — B182 Chronic viral hepatitis C: Secondary | ICD-10-CM

## 2012-12-20 LAB — COMPREHENSIVE METABOLIC PANEL
ALT: 28 U/L (ref 0–53)
AST: 42 U/L — ABNORMAL HIGH (ref 0–37)
Alkaline Phosphatase: 47 U/L (ref 39–117)
CO2: 24 mEq/L (ref 19–32)
Calcium: 8.3 mg/dL — ABNORMAL LOW (ref 8.4–10.5)
GFR calc non Af Amer: >90 mL/min (ref >90)
Potassium: 3.2 mEq/L — ABNORMAL LOW (ref 3.5–5.1)
Sodium: 138 mEq/L (ref 135–145)
Total Protein: 4.9 g/dL — ABNORMAL LOW (ref 6.0–8.3)

## 2012-12-20 LAB — CBC WITH DIFFERENTIAL/PLATELET
Basophils Absolute: 0 10*3/uL (ref 0.0–0.1)
Basophils Relative: 1 % (ref 0–1)
Eosinophils Absolute: 0.1 10*3/uL (ref 0.0–0.7)
HCT: 33.1 % — ABNORMAL LOW (ref 39.0–52.0)
Hemoglobin: 11.4 g/dL — ABNORMAL LOW (ref 13.0–17.0)
MCH: 30.5 pg (ref 26.0–34.0)
MCHC: 34.4 g/dL (ref 30.0–36.0)
MCV: 88.5 fL (ref 78.0–100.0)
Monocytes Absolute: 0.8 10*3/uL (ref 0.1–1.0)
Monocytes Relative: 18 % — ABNORMAL HIGH (ref 3–12)
Neutrophils Relative %: 45 % (ref 43–77)
RDW: 12.7 % (ref 11.5–15.5)

## 2012-12-20 LAB — CK: Total CK: 309 U/L — ABNORMAL HIGH (ref 7–232)

## 2012-12-20 MED ORDER — CEFPODOXIME PROXETIL 200 MG PO TABS: 200.0000 mg | ORAL_TABLET | Freq: Two times a day (BID) | ORAL | Status: DC

## 2012-12-20 MED ORDER — RISPERIDONE 1 MG PO TABS: 1.0000 mg | ORAL_TABLET | Freq: Every day | ORAL | Status: DC

## 2012-12-20 MED ORDER — CEFPODOXIME PROXETIL 200 MG PO TABS
200.0000 mg | ORAL_TABLET | Freq: Two times a day (BID) | ORAL | Status: DC
  Filled 2012-12-20: qty 1

## 2012-12-20 MED ORDER — POTASSIUM CHLORIDE CRYS ER 20 MEQ PO TBCR
20.0000 meq | EXTENDED_RELEASE_TABLET | Freq: Once | ORAL | Status: DC
  Filled 2012-12-20: qty 1

## 2012-12-20 MED ORDER — INFLUENZA VAC SPLIT QUAD 0.5 ML IM SUSP
0.5000 mL | Freq: Once | INTRAMUSCULAR | Status: AC
  Administered 2012-12-20: 0.5 mL via INTRAMUSCULAR
  Filled 2012-12-20: qty 0.5

## 2012-12-20 MED ORDER — POTASSIUM CHLORIDE CRYS ER 20 MEQ PO TBCR
30.0000 meq | EXTENDED_RELEASE_TABLET | Freq: Once | ORAL | Status: AC
  Administered 2012-12-20: 10:00:00 30 meq via ORAL
  Filled 2012-12-20: qty 1

## 2012-12-20 MED ORDER — AZITHROMYCIN 500 MG PO TABS: 500.0000 mg | ORAL_TABLET | Freq: Every day | ORAL | Status: DC

## 2012-12-20 MED ORDER — SODIUM CHLORIDE 0.9 % IV SOLN
INTRAVENOUS | Status: DC
  Administered 2012-12-20: 10:00:00 via INTRAVENOUS
  Filled 2012-12-20 (×3): qty 1000

## 2012-12-20 MED ORDER — MAGNESIUM SULFATE 40 MG/ML IJ SOLN
2.0000 g | Freq: Once | INTRAMUSCULAR | Status: AC
  Administered 2012-12-20: 2 g via INTRAVENOUS
  Filled 2012-12-20: qty 50

## 2012-12-20 NOTE — Progress Notes (Signed)
Clinical Social Work  CSW spoke with MD who reports he will DC patient today after ensuring that patient can receive medications at jail. CSW spoke with security who is guarding patient who reports that he has called the [employer] to ensure medications will be received and that Trudie Buckler will transport patient at DC.  CSW met with patient at bedside. Patient irritable and reports he did not sleep well last night due to too much noise, having vitals taken, and lab draws. Patient reports that he is not feeling well and feels down today. Patient denies any SI or HI and denies any psychotic symptoms. Patient reports he is indifferent about returning to jail but just feels down.  CSW will continue to follow to offer support and will assist as needed.  [place], [place] [phone]

## 2012-12-20 NOTE — Progress Notes (Signed)
Patient discharged. [employer] given discharge information. Escorted by [facility]. [place], Don Broach

## 2012-12-20 NOTE — Discharge Summary (Signed)
Physician Discharge Summary  Andrew Chaney ZOX:[id number] DOB: [birth year] DOA: 12/16/2012  PCP: No primary provider on file.  Admit date: 12/16/2012 Discharge date: 12/20/2012  Recommendations for Outpatient Follow-up:  1. Pt will need to follow up with PCP in 1 weeks post discharge   Discharge Diagnoses:  Principal Problem:   Acute encephalopathy Active Problems:   Renal failure   ARF (acute renal failure)   Hyponatremia   CAP (community acquired pneumonia)   Rhabdomyolysis   Psychotic disorder with delusions   Chronic hepatitis C Acute Encephalopathy  -most likely Metabolic   -Has a Psych history and records have been requested from [facility]  -Psychiatry evaluated patient recommended continuing Risperdal 1 mg at bedtime-- -HIV negative -hep C antibody positive--> obtain HCV viral PCR, (positive) my: Patient will need referral to a hepatologist after he is released from prison  - TSH/freeT4; WNL  - B12; WNL  - folate; High  -methylmalonic acid; WNL -Appears to be back to baseline   acute kidney injury  -Likely due to rhabdomyolysis and volume depletion  -Improved with IV fluids  -Serum creatinine 0.61 on the day of discharge  -Serum creatinine 2.98 on the day of admission  Rhabdomyolysis  -CPK 6465 on the day of admission  -CPK 275 on the day of discharge  -Clinically improved without any muscle weakness  Pulmonary infiltrates  -Treated as community-acquired pneumonia with ceftriaxone and azithromycin as a result of leukocytosis -Continue antibiotics--cefpodoxime and azithromycin for 4 additional days which will complete one week of therapy  Hyponatremia  -Due to volume depletion  -Resolved with fluid hydration  Psychotic disorder with delusions  -Per psychiatry start Risperdal 1 mg each bedtime  -Per psychiatry does not exhibit danger to himself or others. He does not meet criteria for involuntary admission  -Per officers guarding patient when patient stable, he will  [place] Hypokalemia/ hypomagnesemia -Repleted Chronic hepatitis C -Will need outpatient followup -Hep C PCR, DNA is positive Discharge Condition: Stable   Disposition: Return to  [facility] Readings from Last 3 Encounters:  12/18/12 88.451 kg (195 lb)    History of present illness:  Andrew Chaney is a 57 y.o WM, PMHx unknown. Found by the police sleeping outside and when they spoke to him He replied that he was "Andrew Chaney". He was talking and not making much sense and was brought to the ED for an evaluation. He has been rambling on and on, and at times had episodes where he was shaking his lower extremities or his upper extremities. He reports that he just got out of prison, but the police reports that he has been released from [place] 3 days ago. He has not been eating or drinking very well over there past 3 days. He has moments of clarity but has not been forthcoming with his medical history.  In the ED, Psychiatry was consulted, and he was evaluated by the EDP and found to have multiple lab abnormalities so he was referred for admission to medicine. He was evaluated by [facility] and reported that he is suicidal and has a plan to use bed sheets to hang himself and he has been placed on suicide precautions, and a 1:1 sitter has been requested. 12/17/2012 patient A./O. Andrew Chaney, still unable to inform medical staff of how he actually got here. Does not know where he is, when it is, why he's here. Patient does know name at this time. States just released from [place]. During the hospitalization, the patient's  mentation although he would still have episodes of disorientation. He was started on intravenous fluids for his rhabdomyolysis and antibiotics for presumptive community acquired pneumonia. He was evaluated by psychiatry who did not feel that the patient met the criteria for involuntary commitment. He denied any additional homicidal or suicidal thoughts. His  flight of ideas improved and his conversation became more relevant. He was more cooperative without any further agitation or aggression.    Consultants: psychiatry  Discharge Exam: Filed Vitals:   12/20/12 0600  BP: 145/79  Pulse: 85  Temp: 98.6 F (37 C)  Resp: 16   Filed Vitals:   12/19/12 1408 12/19/12 1736 12/19/12 2107 12/20/12 0600  BP: 130/80 122/77 113/66 145/79  Pulse: 59 63 73 85  Temp: 98.5 F (36.9 C) 98.1 F (36.7 C) 98.6 F (37 C) 98.6 F (37 C)  TempSrc: Axillary Oral Oral Oral  Resp: 18 18 16 16   Height:      Weight:      SpO2: 97% 97% 95% 96%   General:  alert and awake, NAD, pleasant, cooperative--follows commands Cardiovascular: RRR, no rub, no gallop, no S3 Respiratory: Basilar crackles without any wheezing. Good air movement.  Abdomen:soft, nontender, nondistended, positive bowel sounds Extremities: 1+LE  edema, No lymphangitis, no petechiae  Discharge Instructions      Discharge Orders   Future Orders Complete By Expires   Diet - low sodium heart healthy  As directed    Increase activity slowly  As directed        Medication List         azithromycin 500 MG tablet  Commonly known as:  ZITHROMAX  Take 1 tablet (500 mg total) by mouth at bedtime.     cefpodoxime 200 MG tablet  Commonly known as:  VANTIN  Take 1 tablet (200 mg total) by mouth every 12 (twelve) hours.     risperiDONE 1 MG tablet  Commonly known as:  RISPERDAL  Take 1 tablet (1 mg total) by mouth at bedtime.         The results of significant diagnostics from this hospitalization (including imaging, microbiology, ancillary and laboratory) are listed below for reference.    Significant Diagnostic Studies: Dg Chest 2 View  12/16/2012   CLINICAL DATA:  Hallucinations; remote history of smoking.  EXAM: CHEST  2 VIEW  COMPARISON:  None.  FINDINGS: The lungs are well-aerated. Vague mild right central airspace opacity may reflect atelectasis or possibly mild pneumonia.  There is no evidence of pleural effusion or pneumothorax.  The heart is normal in size; the mediastinal contour is within normal limits. No acute osseous abnormalities are seen.  IMPRESSION: Vague mild right central airspace opacity may reflect atelectasis or possibly mild pneumonia.   Electronically Signed   By: Roanna Raider M.D.   On: 12/16/2012 22:58   Ct Head Wo Contrast  12/16/2012   CLINICAL DATA:  Altered mental status.  EXAM: CT HEAD WITHOUT CONTRAST  TECHNIQUE: Contiguous axial images were obtained from the base of the skull through the vertex without intravenous contrast.  COMPARISON:  None.  FINDINGS: Normal appearing cerebral hemispheres and posterior fossa structures. Normal size and position of the ventricles. No intracranial hemorrhage, mass lesion or CT evidence of acute infarction. Nasal bone fractures without overlying soft tissue swelling. These appear somewhat corticated.  IMPRESSION: 1. No intracranial abnormality. 2. Nasal bone fractures, most likely old.   Electronically Signed   By: Gordan Payment M.D.   On: 12/16/2012 22:21  Dg Chest Port 1 View  12/19/2012   CLINICAL DATA:  Short of breath. Fluid challenge over the last several days. Pulmonary edema. Personal history of germ-cell seminoma.  EXAM: PORTABLE CHEST - 1 VIEW  COMPARISON:  12/16/2012.  FINDINGS: Lung volumes are low with basilar atelectasis. There is pulmonary vascular congestion but no pulmonary edema. Monitoring leads project over the chest. Mediastinal contours show widening of the vascular pedicle, again consistent with vascular congestion. Cardiopericardial silhouette is upper limits of normal for AP projection.  IMPRESSION: Low volume chest with basilar atelectasis and pulmonary vascular congestion.   Electronically Signed   By: Andreas Newport M.D.   On: 12/19/2012 13:21     Microbiology: Recent Results (from the past 240 hour(s))  CULTURE, BLOOD (ROUTINE X 2)     Status: None   Collection Time    12/17/12  12:57 AM      Result Value Range Status   Specimen Description BLOOD LEFT ARM   Final   Special Requests BOTTLES DRAWN AEROBIC AND ANAEROBIC 6CC   Final   Culture  Setup Time     Final   Value: 12/18/2012 14:39     Performed at [facility]   Culture     Final   Value:        BLOOD CULTURE RECEIVED NO GROWTH TO DATE CULTURE WILL BE HELD FOR 5 DAYS BEFORE ISSUING A FINAL NEGATIVE REPORT     Performed at [facility]   Report Status PENDING   Incomplete  CULTURE, BLOOD (ROUTINE X 2)     Status: None   Collection Time    12/17/12  1:07 AM      Result Value Range Status   Specimen Description BLOOD RIGHT HAND   Final   Special Requests BOTTLES DRAWN AEROBIC AND ANAEROBIC 3CC   Final   Culture  Setup Time     Final   Value: 12/17/2012 13:55     Performed at [facility]   Culture     Final   Value:        BLOOD CULTURE RECEIVED NO GROWTH TO DATE CULTURE WILL BE HELD FOR 5 DAYS BEFORE ISSUING A FINAL NEGATIVE REPORT     Performed at [facility]   Report Status PENDING   Incomplete  CLOSTRIDIUM DIFFICILE BY PCR     Status: None   Collection Time    12/17/12  9:42 PM      Result Value Range Status   C difficile by pcr NEGATIVE  NEGATIVE Final   Comment: Performed at [facility]     Labs: Basic Metabolic Panel:  Recent Labs Lab 12/16/12 2135 12/17/12 0427 12/18/12 0523 12/19/12 0430 12/20/12 0517  NA 124* 131* 136 140 138  K 4.3 3.9 3.5 3.0* 3.2*  CL 88* 97 103 107 105  CO2 20 22 22 24 24   GLUCOSE 128* 113* 90 104* 103*  BUN 77* 60* 19 7 6   CREATININE 2.98* 1.88* 0.81 0.64 0.61  CALCIUM 9.6 8.6 8.4 8.1* 8.3*  MG  --   --  1.9 1.6 1.4*   Liver Function Tests:  Recent Labs Lab 12/16/12 2135 12/18/12 0523 12/19/12 0430 12/20/12 0517  AST 242* 86* 54* 42*  ALT 65* 35 30 28  ALKPHOS 64 42 45 47  BILITOT 1.3* 0.7 0.4 0.4  PROT 7.0 5.0* 4.7* 4.9*  ALBUMIN 4.2 2.7* 2.4* 2.4*   No results found for this basename: LIPASE,  AMYLASE,  in the last 168 hours No results found for this basename: AMMONIA,  in the last 168 hours CBC:  Recent Labs Lab 12/16/12 2135 12/16/12 2231 12/17/12 0427 12/18/12 0523 12/19/12 0430 12/20/12 0517  WBC 20.4*  --  14.9* 6.9 5.7 4.4  NEUTROABS  --  16.7*  --  4.5 2.9 2.0  HGB 12.9*  --  10.6* 10.1* 10.5* 11.4*  HCT 35.7*  --  30.6* 29.7* 31.1* 33.1*  MCV 85.8  --  86.4 89.7 89.6 88.5  PLT 233  --  199 202 221 228   Cardiac Enzymes:  Recent Labs Lab 12/19/12 1324 12/19/12 1730 12/19/12 2115 12/20/12 0140 12/20/12 0517  CKTOTAL 398* 358* 332* 309* 275*   BNP: No components found with this basename: POCBNP,  CBG: No results found for this basename: GLUCAP,  in the last 168 hours  Time coordinating discharge:  Greater than 30 minutes  Signed:  Lathaniel Legate, DO [facility] Pager: [phone] 12/20/2012, 9:35 AM

## 2012-12-21 LAB — VITAMIN B1: Vitamin B1 (Thiamine): 7 nmol/L — ABNORMAL LOW (ref 8–30)

## 2012-12-23 LAB — CULTURE, BLOOD (ROUTINE X 2)

## 2012-12-24 LAB — CULTURE, BLOOD (ROUTINE X 2): Culture: NO GROWTH

## 2013-03-13 ENCOUNTER — Encounter (HOSPITAL_COMMUNITY): Payer: Self-pay | Admitting: Emergency Medicine

## 2013-03-13 ENCOUNTER — Emergency Department (HOSPITAL_COMMUNITY)
Admission: EM | Admit: 2013-03-13 | Discharge: 2013-03-13 | Disposition: A | Discharge status: Home / Self Care | Payer: Medicaid Other | Attending: Emergency Medicine | Admitting: Emergency Medicine

## 2013-03-13 ENCOUNTER — Emergency Department (INDEPENDENT_AMBULATORY_CARE_PROVIDER_SITE_OTHER)
Admission: EM | Admit: 2013-03-13 | Discharge: 2013-03-13 | Disposition: A | Discharge status: Nursing Home (Includes ICF) | Payer: Medicaid Other | Source: Home / Self Care | Attending: Family Medicine | Admitting: Family Medicine

## 2013-03-13 DIAGNOSIS — F3010 Manic episode without psychotic symptoms, unspecified: Secondary | ICD-10-CM

## 2013-03-13 DIAGNOSIS — Z8589 Personal history of malignant neoplasm of other organs and systems: Secondary | ICD-10-CM | POA: Insufficient documentation

## 2013-03-13 DIAGNOSIS — Z87891 Personal history of nicotine dependence: Secondary | ICD-10-CM | POA: Insufficient documentation

## 2013-03-13 DIAGNOSIS — L539 Erythematous condition, unspecified: Secondary | ICD-10-CM | POA: Insufficient documentation

## 2013-03-13 DIAGNOSIS — M7989 Other specified soft tissue disorders: Secondary | ICD-10-CM | POA: Insufficient documentation

## 2013-03-13 DIAGNOSIS — R609 Edema, unspecified: Secondary | ICD-10-CM | POA: Insufficient documentation

## 2013-03-13 DIAGNOSIS — F309 Manic episode, unspecified: Secondary | ICD-10-CM

## 2013-03-13 DIAGNOSIS — G4700 Insomnia, unspecified: Secondary | ICD-10-CM | POA: Insufficient documentation

## 2013-03-13 LAB — RAPID URINE DRUG SCREEN, HOSP PERFORMED
AMPHETAMINES: NOT DETECTED
BARBITURATES: NOT DETECTED
Benzodiazepines: NOT DETECTED
Cocaine: NOT DETECTED
OPIATES: NOT DETECTED
TETRAHYDROCANNABINOL: NOT DETECTED

## 2013-03-13 LAB — ETHANOL: Alcohol, Ethyl (B): <11 mg/dL (ref 0–11)

## 2013-03-13 LAB — CBC WITH DIFFERENTIAL/PLATELET
Basophils Absolute: 0 10*3/uL (ref 0.0–0.1)
Basophils Relative: 0 % (ref 0–1)
Eosinophils Absolute: 0.1 10*3/uL (ref 0.0–0.7)
Eosinophils Relative: 1 % (ref 0–5)
HCT: 35.2 % — ABNORMAL LOW (ref 39.0–52.0)
HEMOGLOBIN: 11.9 g/dL — AB (ref 13.0–17.0)
LYMPHS ABS: 1.4 10*3/uL (ref 0.7–4.0)
LYMPHS PCT: 22 % (ref 12–46)
MCH: 32.2 pg (ref 26.0–34.0)
MCHC: 33.8 g/dL (ref 30.0–36.0)
MCV: 95.4 fL (ref 78.0–100.0)
MONOS PCT: 21 % — AB (ref 3–12)
Monocytes Absolute: 1.4 10*3/uL — ABNORMAL HIGH (ref 0.1–1.0)
NEUTROS ABS: 3.5 10*3/uL (ref 1.7–7.7)
Neutrophils Relative %: 55 % (ref 43–77)
PLATELETS: 227 10*3/uL (ref 150–400)
RBC: 3.69 MIL/uL — AB (ref 4.22–5.81)
RDW: 13.5 % (ref 11.5–15.5)
WBC: 6.3 10*3/uL (ref 4.0–10.5)

## 2013-03-13 LAB — COMPREHENSIVE METABOLIC PANEL
ALK PHOS: 54 U/L (ref 39–117)
ALT: 12 U/L (ref 0–53)
AST: 18 U/L (ref 0–37)
Albumin: 3.6 g/dL (ref 3.5–5.2)
BUN: 10 mg/dL (ref 6–23)
CHLORIDE: 100 meq/L (ref 96–112)
CO2: 26 mEq/L (ref 19–32)
Calcium: 9.5 mg/dL (ref 8.4–10.5)
Creatinine, Ser: 0.88 mg/dL (ref 0.50–1.35)
GFR calc non Af Amer: >90 mL/min (ref >90)
GLUCOSE: 85 mg/dL (ref 70–99)
Potassium: 4 mEq/L (ref 3.7–5.3)
Sodium: 139 mEq/L (ref 137–147)
Total Bilirubin: 0.4 mg/dL (ref 0.3–1.2)
Total Protein: 6.7 g/dL (ref 6.0–8.3)

## 2013-03-13 LAB — URINALYSIS, ROUTINE W REFLEX MICROSCOPIC
Bilirubin Urine: NEGATIVE
Glucose, UA: NEGATIVE mg/dL
HGB URINE DIPSTICK: NEGATIVE
KETONES UR: NEGATIVE mg/dL
LEUKOCYTES UA: NEGATIVE
Nitrite: NEGATIVE
PROTEIN: NEGATIVE mg/dL
Specific Gravity, Urine: 1.009 (ref 1.005–1.030)
Urobilinogen, UA: 0.2 mg/dL (ref 0.0–1.0)
pH: 6.5 (ref 5.0–8.0)

## 2013-03-13 LAB — CK: Total CK: 104 U/L (ref 7–232)

## 2013-03-13 MED ORDER — DIAZEPAM 5 MG PO TABS: 5.0000 mg | ORAL_TABLET | Freq: Every evening | ORAL | Status: DC | PRN

## 2013-03-13 NOTE — ED Provider Notes (Signed)
Medical screening examination/treatment/procedure(s) were performed by non-physician practitioner and as supervising physician I was immediately available for consultation/collaboration.  Richarda Blade, MD 03/13/13 2032

## 2013-03-13 NOTE — ED Provider Notes (Signed)
CSN: [id number]     Arrival date & time 03/13/13  1403 History   First MD Initiated Contact with Patient 03/13/13 1512     Chief Complaint  Patient presents with  . Insomnia   (Consider location/radiation/quality/duration/timing/severity/associated sxs/prior Treatment) HPI Comments: Patient presents stating he has not slept for more than an hour since September 2014 and he requests a prescription for Valium for sleep. 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 Story wanders to a discussion of his ability to obtain a card for medical marijuana for use as an appetite stimulant and for insomnia. States he is now living with family in area. States he was diagnosed with cancer (?) and hospitalized for treatment while in [name]. Following treatment patient states he was then incarcerated after attempting to rob a bank. Reports he was then transferred to [facility] where he spent 44 months, having been released on Dec. 3, 2014.  Review of old records indicate that patient was hospitalized at [facility] on Dec. 6, 2014 with active hallucinations, acute encephalopathy and psychosis with delusional behavior.  Patient expresses several times during history that he would be nowhere without [employer].  Denies previous treatment for psychiatric illness.  Reports occasional    The history is provided by the patient.    Past Medical History  Diagnosis Date  . 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 No pertinent past surgical history. No family history on file. History  Substance Use Topics  . Smoking status: Former Smoker    Types: Cigarettes  . Smokeless tobacco: Not on file  . Alcohol Use: No    Review of Systems  Constitutional: Positive for appetite change.  HENT: Positive for congestion and sinus pressure.   Eyes: Negative.    Respiratory: Negative.   Cardiovascular: Negative.   Gastrointestinal: Negative.   Endocrine: Positive for cold intolerance.  Genitourinary: Negative.   Musculoskeletal: Negative.   Allergic/Immunologic: Negative.   Neurological: Negative.   Psychiatric/Behavioral: Positive for sleep disturbance, decreased concentration and agitation. Negative for suicidal ideas and self-injury.    Allergies  Review of patient's allergies indicates no known allergies.  Home Medications   Current Outpatient Rx  Name  Route  Sig  Dispense  Refill  . azithromycin (ZITHROMAX) 500 MG tablet   Oral   Take 1 tablet (500 mg total) by mouth at bedtime.   4 tablet   0   . cefpodoxime (VANTIN) 200 MG tablet   Oral   Take 1 tablet (200 mg total) by mouth every 12 (twelve) hours.   8 tablet   0   . risperiDONE (RISPERDAL) 1 MG tablet   Oral   Take 1 tablet (1 mg total) by mouth at bedtime.   30 tablet   1    BP 131/77  Pulse 96  Temp(Src) 99.5 F (37.5 C) (Oral)  Resp 16  SpO2 99% Physical Exam  Nursing note and vitals reviewed. Constitutional: He appears well-developed and well-nourished. No distress.  HENT:  Head: Normocephalic and atraumatic.  Eyes: Conjunctivae are normal. No scleral icterus.  Neck: Normal range of motion. Neck supple.  Cardiovascular: Normal rate.   Pulmonary/Chest: Effort normal and breath sounds normal.  Musculoskeletal: Normal range of motion.  Neurological: He is alert. No cranial nerve deficit. Coordination normal.  Skin: Skin is warm and dry.  Psychiatric:  His speech is rapid and/or pressured and tangential. Thought content is delusional. Cognition and memory are impaired.  Polite and cooperative    ED Course  Procedures (including critical care time) Labs Review Labs Reviewed - No data to display Imaging Review No results found.   MDM   1. Manic behavior    Mania with delusional thoughts: Advised patient that it would be of greater benefit to  him to ensure his medical stability before initiating any new medications or restarting previous psychiatric medications. Will transfer to [facility] ER for additional evaluation and treatment.     [place], [place] 03/13/13 [id number]

## 2013-03-13 NOTE — ED Notes (Signed)
PT comfortable with d/c and follow up instructions. Prescriptions x1

## 2013-03-13 NOTE — ED Provider Notes (Signed)
CSN: [id number]     Arrival date & time 03/13/13  1635 History   First MD Initiated Contact with Patient 03/13/13 1951     Chief Complaint  Patient presents with  . Insomnia  . Leg Swelling     (Consider location/radiation/quality/duration/timing/severity/associated sxs/prior Treatment) HPI Comments: Patient is here requesting Valium for sleep.  He, states he has a history of insomnia, and he tried all over-the-counter medicines without any relief. He also has chronic lower leg swelling. He did check with his parole officer, if it's our right for him to take a narcotic and he was given the okay as long as he has a prescription with his name on it      The history is provided by the patient.    Past Medical History  Diagnosis Date  . 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 No pertinent past surgical history. No family history on file. History  Substance Use Topics  . Smoking status: Former Smoker    Types: Cigarettes  . Smokeless tobacco: Not on file  . Alcohol Use: No    Review of Systems  Respiratory: Negative for cough.   Cardiovascular: Positive for leg swelling. Negative for chest pain.  Neurological: Negative for dizziness and headaches.  Psychiatric/Behavioral: Positive for sleep disturbance. Negative for suicidal ideas, behavioral problems and agitation.      Allergies  Review of patient's allergies indicates no known allergies.  Home Medications   Current Outpatient Rx  Name  Route  Sig  Dispense  Refill  . diazepam (VALIUM) 5 MG tablet   Oral   Take 1 tablet (5 mg total) by mouth at bedtime as needed and may repeat dose one time if needed for anxiety.   10 tablet   0    BP 116/71  Pulse 83  Temp(Src) 98.5 F (36.9 C) (Oral)  Resp 18  SpO2 98% Physical Exam  Nursing note and vitals reviewed. Constitutional: He appears well-developed and well-nourished.  Eyes: Pupils are equal, round, and reactive to light.  Neck: Normal range  of motion.  Cardiovascular: Normal rate.   Pulmonary/Chest: Effort normal.  Abdominal: Soft.  Musculoskeletal: Normal range of motion. He exhibits edema. He exhibits no tenderness.  Skin: Skin is warm. There is erythema.  Chronic skin changes, dry patches, erythema of the lower legs without specific tenderness    ED Course  Procedures (including critical care time) Labs Review Labs Reviewed  CBC WITH DIFFERENTIAL - Abnormal; Notable for the following:    RBC 3.69 (*)    Hemoglobin 11.9 (*)    HCT 35.2 (*)    Monocytes Relative 21 (*)    Monocytes Absolute 1.4 (*)    All other components within normal limits  COMPREHENSIVE METABOLIC PANEL  ETHANOL  URINE RAPID DRUG SCREEN (HOSP PERFORMED)  CK  URINALYSIS, ROUTINE W REFLEX MICROSCOPIC   Imaging Review No results found.   EKG Interpretation None     Hasn't been checked, all within normal parameters.  Urine drug screen is negative.  Patient is alert, appropriate, cooperative spoken with his portal officer on the okay to receive medication.  For sleep.  I will give him 10 tablets of 5 mg Valium.  He can take 1 tablet repeat x1, if, needed.  I've also given him a referral list so he can establish with a local primary care physician MDM   Final diagnoses:  Insomnia         Lula Olszewski  Olean Ree, NP 03/13/13 2104

## 2013-03-13 NOTE — ED Notes (Signed)
Evaluated by Dot Been , pa prior to this nurse.  C/o insomnia per patient and reporting valium is the only thing that helps.

## 2013-03-13 NOTE — Discharge Instructions (Signed)
Insomnia Insomnia is frequent trouble falling and/or staying asleep. Insomnia can be a long term problem or a short term problem. Both are common. Insomnia can be a short term problem when the wakefulness is related to a certain stress or worry. Long term insomnia is often related to ongoing stress during waking hours and/or poor sleeping habits. Overtime, sleep deprivation itself can make the problem worse. Every little thing feels more severe because you are overtired and your ability to cope is decreased. CAUSES   Stress, anxiety, and depression.  Poor sleeping habits.  Distractions such as TV in the bedroom.  Naps close to bedtime.  Engaging in emotionally charged conversations before bed.  Technical reading before sleep.  Alcohol and other sedatives. They may make the problem worse. They can hurt normal sleep patterns and normal dream activity.  Stimulants such as caffeine for several hours prior to bedtime.  Pain syndromes and shortness of breath can cause insomnia.  Exercise late at night.  Changing time zones may cause sleeping problems (jet lag). It is sometimes helpful to have someone observe your sleeping patterns. They should look for periods of not breathing during the night (sleep apnea). They should also look to see how long those periods last. If you live alone or observers are uncertain, you can also be observed at a sleep clinic where your sleep patterns will be professionally monitored. Sleep apnea requires a checkup and treatment. Give your caregivers your medical history. Give your caregivers observations your family has made about your sleep.  SYMPTOMS   Not feeling rested in the morning.  Anxiety and restlessness at bedtime.  Difficulty falling and staying asleep. TREATMENT   Your caregiver may prescribe treatment for an underlying medical disorders. Your caregiver can give advice or help if you are using alcohol or other drugs for self-medication. Treatment  of underlying problems will usually eliminate insomnia problems.  Medications can be prescribed for short time use. They are generally not recommended for lengthy use.  Over-the-counter sleep medicines are not recommended for lengthy use. They can be habit forming.  You can promote easier sleeping by making lifestyle changes such as:  Using relaxation techniques that help with breathing and reduce muscle tension.  Exercising earlier in the day.  Changing your diet and the time of your last meal. No night time snacks.  Establish a regular time to go to bed.  Counseling can help with stressful problems and worry.  Soothing music and white noise may be helpful if there are background noises you cannot remove.  Stop tedious detailed work at least one hour before bedtime. HOME CARE INSTRUCTIONS   Keep a diary. Inform your caregiver about your progress. This includes any medication side effects. See your caregiver regularly. Take note of:  Times when you are asleep.  Times when you are awake during the night.  The quality of your sleep.  How you feel the next day. This information will help your caregiver care for you.  Get out of bed if you are still awake after 15 minutes. Read or do some quiet activity. Keep the lights down. Wait until you feel sleepy and go back to bed.  Keep regular sleeping and waking hours. Avoid naps.  Exercise regularly.  Avoid distractions at bedtime. Distractions include watching television or engaging in any intense or detailed activity like attempting to balance the household checkbook.  Develop a bedtime ritual. Keep a familiar routine of bathing, brushing your teeth, climbing into bed at the same  time each night, listening to soothing music. Routines increase the success of falling to sleep faster.  Use relaxation techniques. This can be using breathing and muscle tension release routines. It can also include visualizing peaceful scenes. You can  also help control troubling or intruding thoughts by keeping your mind occupied with boring or repetitive thoughts like the old concept of counting sheep. You can make it more creative like imagining planting one beautiful flower after another in your backyard garden.  During your day, work to eliminate stress. When this is not possible use some of the previous suggestions to help reduce the anxiety that accompanies stressful situations. MAKE SURE YOU:   Understand these instructions.  Will watch your condition.  Will get help right away if you are not doing well or get worse. Document Released: 12/26/1999 Document Revised: 03/22/2011 Document Reviewed: 01/25/2007 [facility] Patient Information 2014 [place].  Emergency Department Resource Guide 1) Find a Doctor and Pay Out of Pocket Although you won't have to find out who is covered by your insurance plan, it is a good idea to ask around and get recommendations. You will then need to call the office and see if the doctor you have chosen will accept you as a new patient and what types of options they offer for patients who are self-pay. Some doctors offer discounts or will set up payment plans for their patients who do not have insurance, but you will need to ask so you aren't surprised when you get to your appointment.  2) Contact Your Local Health Department Not all health departments have doctors that can see patients for sick visits, but many do, so it is worth a call to see if yours does. If you don't know where your local health department is, you can check in your phone book. The CDC also has a tool to help you locate your state's health department, and many state websites also have listings of all of their local health departments.  3) Find a [facility] If your illness is not likely to be very severe or complicated, you may want to try a walk in clinic. These are popping up all over the country in pharmacies, drugstores, and  shopping centers. They're usually staffed by nurse practitioners or physician assistants that have been trained to treat common illnesses and complaints. They're usually fairly quick and inexpensive. However, if you have serious medical issues or chronic medical problems, these are probably not your best option.  No Primary Care Doctor: - Call Health Connect at  [phone] - they can help you locate a primary care doctor that  accepts your insurance, provides certain services, etc. - Physician Referral Service- [phone]  Chronic Pain Problems: Organization         Address  Phone   Notes  [facility]  [phone] Patients need to be referred by their primary care doctor.   Medication Assistance: Organization         Address  Phone   Notes  [facility] Medication [facility] [address] [place], [place] [address] [phone] --Must be a resident of [facility] -- Must have NO insurance coverage whatsoever (no Medicaid/ Medicare, etc.) -- The pt. MUST have a primary care doctor that directs their care regularly and follows them in the community   Med[facility]ist  [phone]   [employer]  [phone]    Agencies that provide inexpensive medical care: Organization  Address  Phone   Notes  [place]  [phone]   [facility]    [phone]   [facility] [place], [place] [address] [phone]   [place] [address] [place] [place] [phone]   Planned Parenthood    [phone]   [facility]    [phone]   [place] and [place] [address], [place] Phone:  [phone], Fax:  [phone] Hours of Operation:  9 am - 6 pm, M-F.  Also accepts Medicaid/Medicare and self-pay.  [facility] for [place], [address], [place] Phone: [phone],  Fax: [phone]. Hours of Operation:  8:30 am - 5:30 pm, M-F.  Also accepts Medicaid and self-pay.  [facility] [place] [address] [place] Phone: [phone]   [place], [place], [place] [phone], Ext. [phone] Mondays & Thursdays: 7-9 AM.  First 15 patients are seen on a first come, first serve basis.    [place] Providers:  Organization         Address  Phone   Notes  [facility] [address], [place] [phone] Also accepts self-pay patients.  [facility] [address], [place]  [phone]   [place], [address], [place] [phone]   [facility] [address] [place] [phone]   Lucianne Lei [address], [place]   [phone] Only accepts [place] Access [place] patients after they have their name applied to their card.   Self-Pay (no insurance) in [facility]:  Organization         Address  Phone   Notes  Sickle Cell Patients, [facility] [place] [phone]   [facility] [place] [phone]   [facility] [place]  [place], [place], [place] [phone]   [facility]/Dr. Osei-Bonsu  [address], [place] or [address], [place] [phone] Phone number for both [place] and [place] locations is the same.  Urgent Medical and [facility] [address] [place] [phone]   [facility] [address], [place] or [address] [phone]   [facility] [address], [place] [phone], phone; [phone], fax Sees patients 1st and 3rd Saturday of every month.  Must not qualify for public or private  insurance (i.e. Medicaid, Medicare, [place] Health Choice, Veterans' Benefits)  Household income should be no more than 200% of the poverty level The clinic cannot treat you if you are pregnant or think you are pregnant  Sexually transmitted diseases are not treated at the clinic.    Dental Care: Organization         Address  Phone  Notes  [facility] [place] [phone] Accepts children up to age 26 who are enrolled in [place] or [place]; pregnant women with a Medicaid card; and children who have applied for Medicaid or [place] Health Choice, but were declined, whose parents can pay a reduced fee at time  of service.  [facility] Department of [facility]  [address], [place] [phone] Accepts children up to age 25 who are enrolled in [place] or [place]; pregnant women with a Medicaid card; and children who have applied for Medicaid or [place] Health Choice, but were declined, whose parents can pay a reduced fee at time of service.  [place] Adult Dental Access PROGRAM  [place] [phone] Patients are seen by appointment only. Walk-ins are not accepted. [place] will see patients 40 years of age and older. Monday - Tuesday (8am-5pm) Most Wednesdays (8:30-5pm) $30 per visit, cash only  [facility] Adult Dental Access PROGRAM  [address], [facility] [phone] Patients are seen by appointment only. Walk-ins are not accepted. [place] will see patients 70 years of age and older. One Wednesday Evening (Monthly: Volunteer Based).  $30 per visit, cash only  [place]  [phone] for adults; Children under age 8, call Graduate Pediatric Dentistry at [phone]. Children aged 31-14, please call [phone] to request a pediatric application.  Dental services are provided in all areas of dental care  including fillings, crowns and bridges, complete and partial dentures, implants, gum treatment, root canals, and extractions. Preventive care is also provided. Treatment is provided to both adults and children. Patients are selected via a lottery and there is often a waiting list.   [facility] [address] [place]  [phone] [email or web address]   [facility] [address] [place], [place] [phone], Ext. [phone] Second and Fourth Thursday of each month, opens at 6:30 AM; Clinic ends at 9 AM.  Patients are seen on a first-come first-served basis, and a limited number are seen during each clinic.   [facility]  [address] Hillard Danker [place], [place] [phone]   Eligibility Requirements You must have lived in [place], [place], or [place] counties for at least the last three months.   You cannot be eligible for state or federal sponsored [employer], including [employer], [place], or [employer].   You generally cannot be eligible for healthcare insurance through your employer.    How to apply: Eligibility screenings are held every Tuesday and Wednesday afternoon from 1:00 pm until 4:00 pm. You do not need an appointment for the interview!  [facility] [address] [place], [place]   [place]  [place]  [place]  [phone]    Behavioral Health Resources in the Community: Intensive Outpatient Programs Organization         Address  Phone  Notes  Miller Brunson. [address] [place], [place] [phone]   [facility] [address], [place], [place]   ADS: Alcohol & Drug Svcs [address], [place], [place]   [place] [address]  [place], [place] or [phone]     Substance Abuse Resources Organization         Address  Phone  Notes  Alcohol and Drug Services  [phone]   [place]  [phone]   [facility] [place]   Chinita Pester  [phone]   Residential & Outpatient Substance Abuse Program  [phone]   Psychological Services Organization         Address  Phone  Notes  [facility]  [place]  [phone]   [place] [address], [place] or [phone]    Mobile Crisis Teams Organization         Address  Phone  Notes  [facility], Mobile Crisis Care Unit  [phone]   [facility]  [address] [place], [place]   [place] Levels [address] [place] [phone]    Self-Help/Support Groups Organization         Address  Phone             Notes  [place]. of [place] - variety of support groups  [place] Call for more information  Narcotics Anonymous (NA), Caring Services [address], [employer] [place]  2 meetings at this location   Special educational needs teacher         Address  Phone  Notes  [facility] [place],    [place]  [phone]   [facility]  [address] [place] [id number], [place], [place]   [place], [place] [phone] Admissions: 8am-3pm M-F  Incentives Substance [place] [address],    [place], [place] [phone]   [facility] [address] [place], [place], [place]   [facility] [address]  [place], [place]   [facility] Programs - Intensive Outpatient [address], [place], [place]   [facility] ([place].) [address].,  [place], [place] [phone] or [phone]   Residential Treatment  Services (RTS) [address] [place], [place] Accepts Medicaid  [facility] [place] [address],  [place] [phone] Substance Abuse/Addiction Treatment   [facility] Organization         Address  Phone  Notes  [facility]  [phone]   Domenic Schwab, PhD [address] Arlis Porta [place], [place]   [phone] or [phone]   [facility] [place], [place] [phone]   [facility] [address] [place], [place] [phone] Insurance/Medicaid/sponsorship through [facility] and Families [address], [address] [place]                                    [place], [place] [phone] [place] [address], [place] [phone]    Dr. Adele Schilder  [phone]   [facility] Dept. 1) [address], [place] 2) [place] 3)  [place] [address], [place] [phone]  [phone]   [place] [phone] or [phone] (After Hours)

## 2013-03-13 NOTE — ED Notes (Addendum)
Pt c/o insomnia since the end of September. sts he is having difficulty thinking clearly d/t the insomnia. sts he has tried every time of sleep aid OTC and nothing works. sts he has also tried drinking ETOH but that hasn't help either. sts he went to urgent care before and they sent him over here for some blood work to make sure they can prescribe him medication. Reports hx of cancer and last time he had the insomnia he was dx with cancer. Pt also bilateral leg swelling, redness and itchiness, has been on going since 2011.
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
## 2013-03-14 NOTE — ED Provider Notes (Signed)
Medical screening examination/treatment/procedure(s) were performed by resident physician or non-physician practitioner and as supervising physician I was immediately available for consultation/collaboration.   Pauline Good MD.   Billy Fischer, MD 03/14/13 [phone]

## 2013-03-14 NOTE — ED Provider Notes (Signed)
Medical screening examination/treatment/procedure(s) were performed by non-physician practitioner and as supervising physician I was immediately available for consultation/collaboration.   EKG Interpretation None        Houston Siren III, MD 03/14/13 [phone]

## 2013-05-12 ENCOUNTER — Emergency Department (HOSPITAL_COMMUNITY)
Admission: EM | Admit: 2013-05-12 | Discharge: 2013-05-13 | Disposition: A | Discharge status: Home / Self Care | Payer: No Typology Code available for payment source | Attending: Emergency Medicine | Admitting: Emergency Medicine

## 2013-05-12 ENCOUNTER — Encounter (HOSPITAL_COMMUNITY): Payer: Self-pay | Admitting: Emergency Medicine

## 2013-05-12 DIAGNOSIS — Y92410 Unspecified street and highway as the place of occurrence of the external cause: Secondary | ICD-10-CM | POA: Insufficient documentation

## 2013-05-12 DIAGNOSIS — S199XXA Unspecified injury of neck, initial encounter: Secondary | ICD-10-CM

## 2013-05-12 DIAGNOSIS — Z87891 Personal history of nicotine dependence: Secondary | ICD-10-CM | POA: Insufficient documentation

## 2013-05-12 DIAGNOSIS — Y9389 Activity, other specified: Secondary | ICD-10-CM | POA: Insufficient documentation

## 2013-05-12 DIAGNOSIS — IMO0002 Reserved for concepts with insufficient information to code with codable children: Secondary | ICD-10-CM | POA: Insufficient documentation

## 2013-05-12 DIAGNOSIS — Z8589 Personal history of malignant neoplasm of other organs and systems: Secondary | ICD-10-CM | POA: Insufficient documentation

## 2013-05-12 DIAGNOSIS — S0993XA Unspecified injury of face, initial encounter: Secondary | ICD-10-CM | POA: Insufficient documentation

## 2013-05-12 DIAGNOSIS — M791 Myalgia, unspecified site: Secondary | ICD-10-CM

## 2013-05-12 MED ORDER — METHOCARBAMOL 500 MG PO TABS
750.0000 mg | ORAL_TABLET | Freq: Once | ORAL | Status: AC
  Administered 2013-05-12: 750 mg via ORAL
  Filled 2013-05-12: qty 2

## 2013-05-12 MED ORDER — IBUPROFEN 400 MG PO TABS
800.0000 mg | ORAL_TABLET | Freq: Once | ORAL | Status: AC
  Administered 2013-05-12: 800 mg via ORAL
  Filled 2013-05-12: qty 2
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
## 2013-05-13 ENCOUNTER — Emergency Department (HOSPITAL_COMMUNITY): Payer: No Typology Code available for payment source

## 2013-05-13 MED ORDER — METHOCARBAMOL 500 MG PO TABS: 500.0000 mg | ORAL_TABLET | Freq: Two times a day (BID) | ORAL | Status: DC | PRN
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
## 2013-05-13 NOTE — ED Provider Notes (Signed)
Medical screening examination/treatment/procedure(s) were performed by non-physician practitioner and as supervising physician I was immediately available for consultation/collaboration.   EKG Interpretation None        Blanchie Dessert, MD 05/13/13 2334

## 2013-07-13 ENCOUNTER — Encounter (HOSPITAL_COMMUNITY): Payer: Self-pay | Admitting: Emergency Medicine

## 2013-07-13 ENCOUNTER — Emergency Department (HOSPITAL_COMMUNITY)
Admission: EM | Admit: 2013-07-13 | Discharge: 2013-07-13 | Disposition: A | Discharge status: Home / Self Care | Payer: No Typology Code available for payment source | Attending: Emergency Medicine | Admitting: Emergency Medicine

## 2013-07-13 DIAGNOSIS — Z5900 Homelessness unspecified: Secondary | ICD-10-CM | POA: Insufficient documentation

## 2013-07-13 DIAGNOSIS — G4700 Insomnia, unspecified: Secondary | ICD-10-CM | POA: Insufficient documentation

## 2013-07-13 DIAGNOSIS — Z8659 Personal history of other mental and behavioral disorders: Secondary | ICD-10-CM | POA: Insufficient documentation

## 2013-07-13 DIAGNOSIS — Z8549 Personal history of malignant neoplasm of other male genital organs: Secondary | ICD-10-CM | POA: Insufficient documentation

## 2013-07-13 DIAGNOSIS — Z87891 Personal history of nicotine dependence: Secondary | ICD-10-CM | POA: Insufficient documentation

## 2013-07-13 MED ORDER — DIAZEPAM 5 MG PO TABS: 5.0000 mg | ORAL_TABLET | Freq: Every evening | ORAL | Status: DC | PRN

## 2013-07-13 NOTE — ED Provider Notes (Signed)
CSN: [id number]     Arrival date & time 07/13/13  0917 History   This chart was scribed for non-physician practitioner, Clayton Bibles ,PA-C,working with Virgel Manifold, MD, by Marlowe Kays, ED Scribe.  This patient was seen in room TR06C/TR06C and the patient's care was started at 10:04 AM.  Chief Complaint  Patient presents with  . Insomnia   The history is provided by the patient. No language interpreter was used.   HPI Comments:  Andrew Chaney is a 58 y.o. homeless male with h/o bipolar disorder who presents to the Emergency Department complaining of insomnia for the past several days. Pt states he has had these symptoms approximately three months ago and was treated with Valium with good results. Pt reports taking Lithium as prescribed and has his levels checked by Dr. Rowe Robert at [place]. He denies depression, suicidal ideations, fever, chills, SOB, CP, vomiting, diarrhea, unexpected weight loss, or any other issues. Pt reports occasional alcohol use.   Past Medical History  Diagnosis Date  . 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 No pertinent past surgical history. History reviewed. No pertinent family history. History  Substance Use Topics  . Smoking status: Former Smoker    Types: Cigarettes  . Smokeless tobacco: Not on file  . Alcohol Use: No    Review of Systems  Constitutional: Negative for fever, chills and unexpected weight change.  Respiratory: Negative for shortness of breath.   Cardiovascular: Negative for chest pain.  Gastrointestinal: Negative for nausea and vomiting.  Psychiatric/Behavioral: Positive for sleep disturbance. Negative for suicidal ideas.  All other systems reviewed and are negative.   Allergies  Review of patient's allergies indicates no known allergies.  Home Medications   Prior to Admission medications   Medication Sig Start Date End Date Taking? Authorizing Provider  diazepam (VALIUM) 5 MG tablet Take 1 tablet (5 mg total) by  mouth at bedtime as needed and may repeat dose one time if needed for anxiety. 03/13/13   Garald Balding, NP  methocarbamol (ROBAXIN) 500 MG tablet Take 1 tablet (500 mg total) by mouth 2 (two) times daily as needed for muscle spasms. 05/13/13   Nicole Pisciotta, PA-C   Triage Vitals: BP 138/83  Pulse 79  Temp(Src) 98.2 F (36.8 C)  Resp 18  Wt 180 lb (81.647 kg)  SpO2 98% Physical Exam  Nursing note and vitals reviewed. Constitutional: He appears well-developed and well-nourished. No distress.  HENT:  Head: Normocephalic and atraumatic.  Neck: Neck supple.  Cardiovascular: Normal rate and regular rhythm.   Pulmonary/Chest: Effort normal and breath sounds normal. No respiratory distress. He has no wheezes. He has no rales.  Abdominal: Soft. He exhibits no distension and no mass. There is no tenderness. There is no rebound and no guarding.  Neurological: He is alert. He exhibits normal muscle tone.  Skin: He is not diaphoretic.    ED Course  Procedures (including critical care time) DIAGNOSTIC STUDIES: Oxygen Saturation is 98% on RA, normal by my interpretation.   COORDINATION OF CARE: 10:10 AM- Advised pt to follow up with his PCP. Will prescribe a small amount of Valium. Pt verbalizes understanding and agrees to plan.  Medications - No data to display  Labs Review Labs Reviewed - No data to display  Imaging Review No results found.   EKG Interpretation None     Checked [place] DEA database, one prescription listed, valium from ED 03/13/13. MDM   Final diagnoses:  Insomnia  Pt with hx bipolar disorder, taking lithium and is well regulated, occasional issues of insomnia - chart reviewed and [place] DEA database checked, has been seen once and received one prescription for valium in the past 6 months.  He does not appear to be manic and denies mania and depression.  Denies SI.  No physical complaints.  Exam is unremarkable.  D/C home with #10 valium.  PCP and psych follow up.   Resources given.  Discussed findings, treatment, and follow up  with patient.  Pt given return precautions.  Pt verbalizes understanding and agrees with plan.      I personally performed the services described in this documentation, which was scribed in my presence. The recorded information has been reviewed and is accurate.    Clayton Bibles, PA-C 07/13/13 1016

## 2013-07-13 NOTE — Discharge Instructions (Signed)
°Insomnia °Insomnia is frequent trouble falling and/or staying asleep. Insomnia can be a long term problem or a short term problem. Both are common. Insomnia can be a short term problem when the wakefulness is related to a certain stress or worry. Long term insomnia is often related to ongoing stress during waking hours and/or poor sleeping habits. Overtime, sleep deprivation itself can make the problem worse. Every little thing feels more severe because you are overtired and your ability to cope is decreased. °CAUSES  °· Stress, anxiety, and depression. °· Poor sleeping habits. °· Distractions such as TV in the bedroom. °· Naps close to bedtime. °· Engaging in emotionally charged conversations before bed. °· Technical reading before sleep. °· Alcohol and other sedatives. They may make the problem worse. They can hurt normal sleep patterns and normal dream activity. °· Stimulants such as caffeine for several hours prior to bedtime. °· Pain syndromes and shortness of breath can cause insomnia. °· Exercise late at night. °· Changing time zones may cause sleeping problems (jet lag). °It is sometimes helpful to have someone observe your sleeping patterns. They should look for periods of not breathing during the night (sleep apnea). They should also look to see how long those periods last. If you live alone or observers are uncertain, you can also be observed at a sleep clinic where your sleep patterns will be professionally monitored. Sleep apnea requires a checkup and treatment. Give your caregivers your medical history. Give your caregivers observations your family has made about your sleep.  °SYMPTOMS  °· Not feeling rested in the morning. °· Anxiety and restlessness at bedtime. °· Difficulty falling and staying asleep. °TREATMENT  °· Your caregiver may prescribe treatment for an underlying medical disorders. Your caregiver can give advice or help if you are using alcohol or other drugs for self-medication.  Treatment of underlying problems will usually eliminate insomnia problems. °· Medications can be prescribed for short time use. They are generally not recommended for lengthy use. °· Over-the-counter sleep medicines are not recommended for lengthy use. They can be habit forming. °· You can promote easier sleeping by making lifestyle changes such as: °¨ Using relaxation techniques that help with breathing and reduce muscle tension. °¨ Exercising earlier in the day. °¨ Changing your diet and the time of your last meal. No night time snacks. °¨ Establish a regular time to go to bed. °· Counseling can help with stressful problems and worry. °· Soothing music and white noise may be helpful if there are background noises you cannot remove. °· Stop tedious detailed work at least one hour before bedtime. °HOME CARE INSTRUCTIONS  °· Keep a diary. Inform your caregiver about your progress. This includes any medication side effects. See your caregiver regularly. Take note of: °¨ Times when you are asleep. °¨ Times when you are awake during the night. °¨ The quality of your sleep. °¨ How you feel the next day. °This information will help your caregiver care for you. °· Get out of bed if you are still awake after 15 minutes. Read or do some quiet activity. Keep the lights down. Wait until you feel sleepy and go back to bed. °· Keep regular sleeping and waking hours. Avoid naps. °· Exercise regularly. °· Avoid distractions at bedtime. Distractions include watching television or engaging in any intense or detailed activity like attempting to balance the household checkbook. °· Develop a bedtime ritual. Keep a familiar routine of bathing, brushing your teeth, climbing into bed at the same   time each night, listening to soothing music. Routines increase the success of falling to sleep faster. °· Use relaxation techniques. This can be using breathing and muscle tension release routines. It can also include visualizing peaceful scenes. You can  also help control troubling or intruding thoughts by keeping your mind occupied with boring or repetitive thoughts like the old concept of counting sheep. You can make it more creative like imagining planting one beautiful flower after another in your backyard garden. °· During your day, work to eliminate stress. When this is not possible use some of the previous suggestions to help reduce the anxiety that accompanies stressful situations. °MAKE SURE YOU:  °· Understand these instructions. °· Will watch your condition. °· Will get help right away if you are not doing well or get worse. °Document Released: 12/26/1999 Document Revised: 03/22/2011 Document Reviewed: 01/25/2007 °ExitCare® Patient Information ©2015 [employer]. This information is not intended to replace advice given to you by your health care provider. Make sure you discuss any questions you have with your health care provider. ° ° °Emergency Department Resource Guide °1) Find a Doctor and Pay Out of Pocket °Although you won't have to find out who is covered by your insurance plan, it is a good idea to ask around and get recommendations. You will then need to call the office and see if the doctor you have chosen will accept you as a new patient and what types of options they offer for patients who are self-pay. Some doctors offer discounts or will set up payment plans for their patients who do not have insurance, but you will need to ask so you aren't surprised when you get to your appointment. ° °2) Contact Your Local Health Department °Not all health departments have doctors that can see patients for sick visits, but many do, so it is worth a call to see if yours does. If you don't know where your local health department is, you can check in your phone book. The CDC also has a tool to help you locate your state's health department, and many state websites also have listings of all of their local health departments. ° °3) Find a Walk-in Clinic °If  your illness is not likely to be very severe or complicated, you may want to try a walk in clinic. These are popping up all over the country in pharmacies, drugstores, and shopping centers. They're usually staffed by nurse practitioners or physician assistants that have been trained to treat common illnesses and complaints. They're usually fairly quick and inexpensive. However, if you have serious medical issues or chronic medical problems, these are probably not your best option. ° °No Primary Care Doctor: °- Call [facility] at  832-8000 - they can help you locate a primary care doctor that  accepts your insurance, provides certain services, etc. °- Physician Referral Service- 1-800-533-3463 ° °Chronic Pain Problems: °Organization         Address  Phone   Notes  °[facility]  (336) 297-2271 Patients need to be referred by their primary care doctor.  ° °Medication Assistance: °Organization         Address  Phone   Notes  °[place] [facility] [address] °[place], [place] [address] (336) 641-8030 --Must be a resident of [place] °-- Must have NO insurance coverage whatsoever (no Medicaid/ Medicare, etc.) °-- The pt. MUST have a primary care doctor that directs their care regularly and follows them   in the community °  °[facility]  (866) 331-1348   °[employer]  (888) 892-1162   ° °Agencies that provide inexpensive medical care: °Organization         Address  Phone   Notes  °[facility]  (336) 832-8035   °[facility]    (336) 832-7272   °[facility] [address] °[place], [place] [address] (336) 832-4777   °[facility] [address], °[place] (336) 271-4999   °[facility]    (336) 373-0678   °[facility]    (336) 272-1050   °[facility] ° [address], [place] Phone:  (336) 832-4444, Fax:  (336) 832-4440 Hours of  Operation:  9 am - 6 pm, M-F.  Also accepts Medicaid/Medicare and self-pay.  °[facility] ° [address], [place] Phone: (336) 832-3150, Fax: (336) [phone]. Hours of Operation:  8:30 am - 5:30 pm, M-F.  Also accepts Medicaid and self-pay.  °[facility] [place] [address], [place] Phone: (336) 878-6027   °[facility] [address], [place], [place] (336)723-1848, Ext. 123 Mondays & Thursdays: 7-9 AM.  First 15 patients are seen on a first come, first serve basis. °  ° °Medicaid-accepting [place] Providers: ° °Organization         Address  Phone   Notes  °[facility] [address], [place] (336) 641-2100 Also accepts self-pay patients.  °[facility] [address], [place] ° (336) 856-9996   °[facility] [address], [place] (336) 288-8857   °[facility] [address] [place] [address], [place] (336) 299-7000   °Veita Bland [address], [place]  ° (336) 373-1557 Only accepts [place] Access Medicaid patients after they have their name applied to their card.  ° °Self-Pay (no insurance) in [place]: ° °Organization         Address  Phone   Notes  °Sickle Cell Patients, [facility] [address], [place] (336) 832-1970   °[facility] [address], [place] (336) 832-4400   °[facility] ° [address], [place] (336) 992-4800   °[facility]/Dr. Osei-Bonsu ° [address] [place] [address], [place] or [address], [place] (336) 841-8500 Phone number for both [place] and [place] locations is the same.  °[facility] [address], [place] (336) 299-0000   °[facility] [address] [place] [address], [place] or [address] (336) 852-7530 °(336) 878-2260   °[facility] [address],  (336) 350-1642, phone; (336) 294-5005, fax Sees patients 1st and 3rd Saturday of every month.  Must not qualify for public or private insurance (i.e. Medicaid, Medicare, [place] Health Choice, Veterans' Benefits) • Household income should be no more than 200% of the poverty level •The clinic cannot treat you if you are pregnant or think you are pregnant • Sexually transmitted diseases are not treated at the clinic.  ° ° °Dental Care: °Organization         Address  Phone  Notes  °[facility] [address],  (336) 641-6152 Accepts children up to age 21 who are enrolled in   Medicaid or [place] Health Choice; pregnant women with a Medicaid card; and children who have applied for Medicaid or [place] Health Choice, but were declined, whose parents can pay a reduced fee at time of service.  °[facility] [place]  [address], [place] (336) 641-7733 Accepts children up to age 21 who are enrolled in Medicaid or [place] Health Choice; pregnant women with a Medicaid card; and children who have applied for Medicaid or [place] Health Choice, but were declined, whose parents can pay a reduced fee at time of service.  °[facility] ° [address], [place] (336) 641-4533 Patients are seen by appointment only. Walk-ins are not accepted. [facility] will see patients 18 years of age and older. °Monday - Tuesday (8am-5pm) °Most Wednesdays (8:30-5pm) °$30 per visit, cash only  °[facility] ° [address], [place] (336) 641-4533 Patients are seen by appointment only. Walk-ins are not accepted. [facility] will see patients 18 years of age and older. °One Wednesday Evening (Monthly: Volunteer Based).  $30 per visit, cash only  °[facility]  (919) 537-3737 for adults; Children under age 4, call Graduate Pediatric  Dentistry at ([phone]) [phone]. Children aged 4-14, please call (919) [phone] to request a pediatric application. ° Dental services are provided in all areas of dental care including fillings, crowns and bridges, complete and partial dentures, implants, gum treatment, root canals, and extractions. Preventive care is also provided. Treatment is provided to both adults and children. °Patients are selected via a lottery and there is often a waiting list. °  °[facility] [address], °[place] ° (336) 763-8833 [email or web address] °  °[facility] [address], [place], [place] (336)723-1848, Ext. 123 Second and Fourth Thursday of each month, opens at 6:30 AM; Clinic ends at 9 AM.  Patients are seen on a first-come first-served basis, and a limited number are seen during each clinic.  ° °[facility] ° [address], [place], [place] (336) 723-7904   Eligibility Requirements °You must have lived in [place], [place], or [place] counties for at least the last three months. °  You cannot be eligible for state or federal sponsored healthcare insurance, including Veterans Administration, Medicaid, or Medicare. °  You generally cannot be eligible for healthcare insurance through your employer.  °  How to apply: °Eligibility screenings are held every Tuesday and Wednesday afternoon from 1:00 pm until 4:00 pm. You do not need an appointment for the interview!  °[facility] [address], [place], [place] 336-631-2330   °[facility]  336-342-8273   °[facility]  336-703-3100   °[facility]  336-570-6415   ° °Behavioral Health Resources in the Community: °Intensive Outpatient Programs °Organization         Address  Phone  Notes  °[facility] [address], [place], [place] 336-878-6098   °[facility] [address], [place], [place] 336-832-9800   °[facility] [address], [place], [place] ° 336-882-2125   °[facility] [address],  °[place],  1-800-853-5163 or 336-641-4981   °Substance Abuse Resources °Organization         Address  Phone  Notes  °[facility]  336-882-2125   °[facility]  336-784-9470   °[facility]  336-285-9073   °[facility]  336-845-3988   °Residential & Outpatient Substance Abuse Program  1-800-659-3381   °Psychological Services °Organization         Address  Phone  Notes  °[facility]  336- 832-9600   °[facility]  336- 378-7881   °[facility] [address], [place] 1-800-853-5163 or 336-641-4981   ° °Mobile Crisis Teams °Organization         Address  Phone  Notes  °[facility], Mobile Crisis Care Unit  1-877-626-1772   °Assertive °[facility] ° [address] [place], [place] 336-834-9664   °Sharon DeEsch [address] °[place] 336-554-5454   ° °Self-Help/Support Groups °Organization         Address  Phone             Notes  °[facility] [place] - variety of support groups  336- 373-1402 Call for more information  °Narcotics Anonymous (NA), [facility] [address], °[place]  2 meetings at this location  ° °Residential Treatment Programs °Organization         Address  Phone  Notes  °[facility] [address],    °[place]  1-866-801-8205   °[facility] ° [address], [place], [place] 704-293-8524   °[facility] [address], [place] 336-845-3988 Admissions: 8am-3pm M-F  °[facility] [address]    °[place], [place] 336-841-1104   °[facility] [address], [place], [place] 336-379-7146   °[facility] [address]  °[place], [place] 336-285-9073   °[facility] Intensive Outpatient [address],  [place], [place] 336-852-3033   °[facility] [address]  °[place], [place] 1-877-615-2722 or 336-784-9470   °[facility] [address] [place], [place] 336-227-7417 Accepts Medicaid  °[facility] [address]  °[place] 1-800-659-3381 Substance Abuse/Addiction Treatment  ° °[place] Behavioral Health Resources °Organization         Address  Phone  Notes  °[facility]  (888) 581-9988   °Julie Brannon, PhD [address] [place], [place]   (336) 349-5553 or (336) 951-0000   °[facility]   [address] °[place], [place] (336) 349-4454   °[facility] [address], [place], [place] (336) 342-8316 Insurance/Medicaid/sponsorship through [facility]  °[facility] [address]                                    [place], [place] (336) 342-8316 Therapy/tele-psych/case  °[facility] [address]  ° [place], [place] (336) 349-2233    °Dr. Arfeen  (336) 349-4544   °[facility] [place]  [employer] [place] [facility] 1) [address], [place] °2) [address], [place] °3)  [address], [place] (336) 349-3220 °(336) 342-7768 ° °(336) 342-8140   °[place] [facility] (336) 342-1394 or (336) 342-3537 (After Hours)    ° ° ° °

## 2013-07-13 NOTE — ED Notes (Signed)
Pt has hx of bipolar dz. Denies that he is "in manic phase". States he has had this problem in the past and was prescribed Valium. States does not like alcohol or narcotics but does like Marijuana, but can't smoke it because he is "on probation".  States "the only thing that helps me is Valium".

## 2013-07-13 NOTE — Progress Notes (Signed)
CSW consult to pt regarding homeless issues. Pt reported that he is homeless and has been living behind an abandoned store on [address] in [place]. Pt reports that does not have family that lives locally but cant call on them for help. Pt didn't want to elaborate. Pt reported that he has been hired at [employer] but is still working out some things. CSW called [employer] to determine if pt can return for he was there in April and didn't stay the allotted amount of time. [employer] reported that pt could not return for he didn't follow the hours for curfew. CSW gave pt information to two other shelters in the [place] area. Pt also given a bus pass.Pt appreciative of information. Pt will be discharged.   [address], [place]

## 2013-07-13 NOTE — ED Notes (Signed)
Per pt sts that he is not sleeping. sts he has been seen here for this in the past and given valium which helps.

## 2013-07-13 NOTE — ED Notes (Signed)
Pt requesting social services. States he sleeps in a tent and everything is wet. Asked if we could "put me up in a motel" or a hospital room. Social services called and will be down to see. Pt offered sandwich but declined.

## 2013-07-13 NOTE — ED Notes (Signed)
CSW in to see pt. Will discharge pt as he waits for resources from [facility].

## 2013-07-15 NOTE — ED Provider Notes (Signed)
Medical screening examination/treatment/procedure(s) were performed by non-physician practitioner and as supervising physician I was immediately available for consultation/collaboration.   EKG Interpretation None       Virgel Manifold, MD 07/15/13 1057

## 2013-08-17 ENCOUNTER — Encounter (HOSPITAL_COMMUNITY): Payer: Self-pay | Admitting: Emergency Medicine

## 2013-08-17 ENCOUNTER — Emergency Department (HOSPITAL_COMMUNITY)
Admission: EM | Admit: 2013-08-17 | Discharge: 2013-08-17 | Disposition: A | Discharge status: Home / Self Care | Payer: No Typology Code available for payment source | Attending: Emergency Medicine | Admitting: Emergency Medicine

## 2013-08-17 DIAGNOSIS — Z8547 Personal history of malignant neoplasm of testis: Secondary | ICD-10-CM | POA: Insufficient documentation

## 2013-08-17 DIAGNOSIS — H6120 Impacted cerumen, unspecified ear: Secondary | ICD-10-CM | POA: Insufficient documentation

## 2013-08-17 DIAGNOSIS — H6123 Impacted cerumen, bilateral: Secondary | ICD-10-CM

## 2013-08-17 DIAGNOSIS — H918X9 Other specified hearing loss, unspecified ear: Secondary | ICD-10-CM | POA: Insufficient documentation

## 2013-08-17 DIAGNOSIS — Z87891 Personal history of nicotine dependence: Secondary | ICD-10-CM | POA: Insufficient documentation

## 2013-08-17 DIAGNOSIS — Z8659 Personal history of other mental and behavioral disorders: Secondary | ICD-10-CM | POA: Insufficient documentation

## 2013-08-17 HISTORY — DX: Bipolar disorder, unspecified: F31.9

## 2013-08-17 NOTE — ED Provider Notes (Signed)
Medical screening examination/treatment/procedure(s) were performed by non-physician practitioner and as supervising physician I was immediately available for consultation/collaboration.   EKG Interpretation None        Malvin Johns, MD 08/17/13 1523

## 2013-08-17 NOTE — Discharge Instructions (Signed)
Cerumen Impaction °A cerumen impaction is when the wax in your ear forms a plug. This plug usually causes reduced hearing. Sometimes it also causes an earache or dizziness. Removing a cerumen impaction can be difficult and painful. The wax sticks to the ear canal. The canal is sensitive and bleeds easily. If you try to remove a heavy wax buildup with a cotton tipped swab, you may push it in further. °Irrigation with water, suction, and small ear curettes may be used to clear out the wax. If the impaction is fixed to the skin in the ear canal, ear drops may be needed for a few days to loosen the wax. People who build up a lot of wax frequently can use ear wax removal products available in your local drugstore. °SEEK MEDICAL CARE IF:  °You develop an earache, increased hearing loss, or marked dizziness. °Document Released: 02/05/2004 Document Revised: 03/22/2011 Document Reviewed: 03/27/2009 °ExitCare® Patient Information ©2015 [employer]. This information is not intended to replace advice given to you by your health care provider. Make sure you discuss any questions you have with your health care provider. ° °

## 2013-08-17 NOTE — ED Notes (Signed)
C/o RIGHT EAR PAIN SINCE 6/23. STATES HAS TO COME TO HOSPITAL AND GET HIS "EARS WASHED OUT ONCE IN A WHILE".

## 2013-08-17 NOTE — ED Provider Notes (Signed)
CSN: [id number]     Arrival date & time 08/17/13  1124 History  This chart was scribed for non-physician practitioner working with No att. providers found by Mercy Moore, ED Scribe. This patient was seen in room TR09C/TR09C and the patient's care was started at 11:59 AM.   No chief complaint on file.   The history is provided by the patient. No language interpreter was used.   HPI Comments: Andrew Chaney is a 58 y.o. male who presents to the Emergency Department complaining of decreased hearing bilaterally, since 07/03/13. He denies true pain. Patient reports periodic need to come to the hospital to have his ears irrigated. He reports recurrent clogging. Attempted treatment with OTC ear drops, without relief.  Patient denies recent fever, nausea, or vomiting.   Past Medical History  Diagnosis Date  . 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 Bipolar 1 disorder    History reviewed. No pertinent past surgical history. History reviewed. No pertinent family history. History  Substance Use Topics  . Smoking status: Former Smoker    Types: Cigarettes  . Smokeless tobacco: Not on file  . Alcohol Use: Yes    Review of Systems  Constitutional: Negative for fever and chills.  HENT: Positive for hearing loss. Negative for ear discharge and ear pain.   Gastrointestinal: Negative for nausea and vomiting.  Neurological: Negative for headaches.      Allergies  Review of patient's allergies indicates no known allergies.  Home Medications   Prior to Admission medications   Medication Sig Start Date End Date Taking? Authorizing Provider  diazepam (VALIUM) 5 MG tablet Take 1 tablet (5 mg total) by mouth at bedtime as needed and may repeat dose one time if needed for anxiety. 03/13/13   Garald Balding, NP  diazepam (VALIUM) 5 MG tablet Take 1 tablet (5 mg total) by mouth at bedtime as needed (insomnia). 07/13/13   Clayton Bibles, PA-C  methocarbamol (ROBAXIN) 500 MG tablet Take 1 tablet (500 mg total)  by mouth 2 (two) times daily as needed for muscle spasms. 05/13/13   Nicole Pisciotta, PA-C   Triage Vitals: BP 141/78  Pulse 88  Temp(Src) 97.7 F (36.5 C) (Oral)  Resp 20  SpO2 96%  Physical Exam  Nursing note and vitals reviewed. Constitutional: He is oriented to person, place, and time. He appears well-developed and well-nourished.  HENT:  Head: Normocephalic and atraumatic.  Right Ear: Hearing and external ear normal.  Left Ear: Hearing and external ear normal.  Cerumen impaction bilaterally. Normal external ear. No erythema or tenderness. No mastoid erythema or tenderness.   Eyes: EOM are normal.  Neck: Neck supple.  Cardiovascular: Normal rate.   Pulmonary/Chest: Effort normal.  Musculoskeletal: Normal range of motion.  Neurological: He is alert and oriented to person, place, and time.  Skin: Skin is warm and dry.  Psychiatric: He has a normal mood and affect. His behavior is normal.    ED Course  Procedures (including critical care time)  COORDINATION OF CARE: 12:06 PM- Plans to flush patient's ears. Discussed treatment plan with patient at bedside and patient agreed to plan.   Labs Review Labs Reviewed - No data to display  Imaging Review No results found.   EKG Interpretation None      MDM   Final diagnoses:  Cerumen impaction, bilateral    pt presenting to ED with bilateral cerumen impaction. Hx of same. Cerumen impaction removal attempted in ED w/o success. Advised to f/u  with Dr. Redmond Baseman, ENT for further evaluation and treatment of cerumen impaction. Not concerned for underlying infection.   I personally performed the services described in this documentation, which was scribed in my presence. The recorded information has been reviewed and is accurate.    Noland Fordyce, PA-C 08/17/13 1321

## 2013-08-17 NOTE — ED Notes (Signed)
Pt asked for sandwich and a bus ticket-given.

## 2014-12-31 ENCOUNTER — Encounter (HOSPITAL_COMMUNITY): Payer: Self-pay | Admitting: Nurse Practitioner

## 2014-12-31 ENCOUNTER — Emergency Department (HOSPITAL_COMMUNITY)
Admission: EM | Admit: 2014-12-31 | Discharge: 2015-01-04 | Disposition: A | Discharge status: Home / Self Care | Payer: Medicaid Other | Attending: Emergency Medicine | Admitting: Emergency Medicine

## 2014-12-31 DIAGNOSIS — N390 Urinary tract infection, site not specified: Secondary | ICD-10-CM | POA: Insufficient documentation

## 2014-12-31 DIAGNOSIS — F17210 Nicotine dependence, cigarettes, uncomplicated: Secondary | ICD-10-CM | POA: Insufficient documentation

## 2014-12-31 DIAGNOSIS — M25572 Pain in left ankle and joints of left foot: Secondary | ICD-10-CM | POA: Insufficient documentation

## 2014-12-31 DIAGNOSIS — M25571 Pain in right ankle and joints of right foot: Secondary | ICD-10-CM | POA: Insufficient documentation

## 2014-12-31 DIAGNOSIS — R410 Disorientation, unspecified: Secondary | ICD-10-CM | POA: Insufficient documentation

## 2014-12-31 DIAGNOSIS — R1110 Vomiting, unspecified: Secondary | ICD-10-CM | POA: Insufficient documentation

## 2014-12-31 DIAGNOSIS — F1210 Cannabis abuse, uncomplicated: Secondary | ICD-10-CM | POA: Insufficient documentation

## 2014-12-31 HISTORY — DX: Disorder of kidney and ureter, unspecified: N28.9

## 2014-12-31 LAB — COMPREHENSIVE METABOLIC PANEL
ALBUMIN: 4.4 g/dL (ref 3.5–5.0)
ALK PHOS: 45 U/L (ref 38–126)
ALT: 30 U/L (ref 17–63)
AST: 29 U/L (ref 15–41)
Anion gap: 12 (ref 5–15)
BILIRUBIN TOTAL: 1.3 mg/dL — AB (ref 0.3–1.2)
BUN: 20 mg/dL (ref 6–20)
CALCIUM: 10.7 mg/dL — AB (ref 8.9–10.3)
CO2: 26 mmol/L (ref 22–32)
CREATININE: 1.47 mg/dL — AB (ref 0.61–1.24)
Chloride: 102 mmol/L (ref 101–111)
GFR calc Af Amer: 59 mL/min — ABNORMAL LOW (ref >60)
GFR calc non Af Amer: 51 mL/min — ABNORMAL LOW (ref >60)
GLUCOSE: 148 mg/dL — AB (ref 65–99)
Potassium: 4.4 mmol/L (ref 3.5–5.1)
Sodium: 140 mmol/L (ref 135–145)
Total Protein: 7.4 g/dL (ref 6.5–8.1)

## 2014-12-31 LAB — CBC
HCT: 48.4 % (ref 39.0–52.0)
Hemoglobin: 16.2 g/dL (ref 13.0–17.0)
MCH: 32.7 pg (ref 26.0–34.0)
MCHC: 33.5 g/dL (ref 30.0–36.0)
MCV: 97.8 fL (ref 78.0–100.0)
Platelets: 274 10*3/uL (ref 150–400)
RBC: 4.95 MIL/uL (ref 4.22–5.81)
RDW: 13.4 % (ref 11.5–15.5)
WBC: 8 10*3/uL (ref 4.0–10.5)

## 2014-12-31 NOTE — ED Provider Notes (Signed)
Medical screening examination/treatment/procedure(s) were conducted as a shared visit with non-physician practitioner(s) and myself.  I personally evaluated the patient during the encounter.   EKG Interpretation   Date/Time:  Wednesday January 01 2015 01:38:16 EST Ventricular Rate:  68 PR Interval:  139 QRS Duration: 97 QT Interval:  339 QTC Calculation: 360 R Axis:   -81 Text Interpretation:  Sinus rhythm Left anterior fascicular block Biphasic  t-waves in lateral leads Otherwise, no significant change since prior  Confirmed by Oval Moralez MD, Annamary Buschman [phone]) on 01/01/2015 2:09:31 AM      59 year old male who presents with concern for manic episode. He has a history of bipolar depression, and was recently incarcerated up until December 5. He has been homeless since his release. Patient says that he has been staying in an unsafe part of town, has not been taking care of himself. A few days ago did have episode of vomiting, and today had an episode of diarrhea. states that he otherwise feels weak and tired. States that his brother drove down [place] today, and found him. He was urged by his brother to come to ED for evaluation. Denies SI/HI, delusions or paranoia. Denies that he is having a manic episodes. States that he would otherwise not come to ED if it wasn't for his brother. No chest pain, SOB, cough, abd pain, fever, chills, or urinary complaints. Was found in his own feces and urine today. Denies trauma, illicit drug use, but does occasionally drink alcohol.   Appears dry on exam, but hemodynamically stable and with stable vital signs. Exam otherwise unremarkable. Basic blood work reveals mild achy I with a creatinine of 1.4. No other major metabolic or electrolyte derangements. He is tolerating oral fluids and food without difficulty, I do not feel that he requires acute bolus of fluids at this time or admission for rehydration. TTS consult was places he has been without his psychiatric medications  since he was incarcerated. They recommended inpatient placement to reestablish his medication regimen. Pending placement at this time.  Forde Dandy, MD 01/01/15 [phone]

## 2014-12-31 NOTE — ED Notes (Signed)
Brother brought pt in today for evaluation. The pt was discharged from jail on December 5 and has been sleeping in the streets since. Brother states the pt is bipolar and was fairly well managed on his bipolar medications until he went tojail and they stopped administering the meds. The pt arrived to ED soiled in urine and feces. Pt states he is concerned about his kidneys because he had kidney failure in the past and has been vomiting this week and unable to eat or drink. He also c/o bilateral foot pain. He denies SI/HI. He reports occasional alcohol but no illegal substance use. Pt is alert, calm.

## 2014-12-31 NOTE — ED Notes (Signed)
Triage delayed due to having to clean pt.

## 2014-12-31 NOTE — ED Notes (Signed)
The patient is unable to give an urine specimen at this time. The RN is in charge.

## 2014-12-31 NOTE — ED Notes (Signed)
The patient is unable to give an urine specimen at this time. The RN is aware. 

## 2014-12-31 NOTE — ED Notes (Addendum)
There is no edema or trauma noted to either feet. Pt states he had vomiting sometime since he got out of prison. Pt also states he thinks he has been having more frequent BMs, but isn't sure stating, "It's hard to keep track of time". Pt has several debilitating mental disorders.

## 2014-12-31 NOTE — ED Provider Notes (Signed)
Patient care assumed from [facility]. Plan is for follow-up on consult to TTS. Will also place consult to social work to ensure patient has outpatient resources to have psychiatric medication reconciliation. CBC and CMP are grossly unremarkable. He does have an elevation in creatinine at 1.47, however this is likely due to dehydration.  Filed Vitals:   12/31/14 2215 12/31/14 2230 12/31/14 2245 12/31/14 2315  BP: 107/63 108/77 110/79 121/79  Pulse: 73 81 80 79  Temp:      TempSrc:      Resp:      SpO2: 100% 100% 99% 96%   Discussed with [facility], Curlene Dolphin. Patient at this time would benefit from inpatient admission for medication stabilization. Behavioral Health is currently seeking placement. Patient currently awaiting UA. Will require chest x-ray and EKG for clearance. Chest x-ray is unremarkable. Pending UA, pt ambulated to bathroom and throughout ED without difficulty. Will place in psych hold. I also discussed and reviewed this case with my attending, Dr. Oleta Mouse who also saw and evaluated the patient. Marland Kitchen  Comer Locket, PA-C 01/01/15 [id number]  Forde Dandy, MD 01/01/15 2113

## 2014-12-31 NOTE — ED Notes (Signed)
TTS at this time. 

## 2014-12-31 NOTE — ED Provider Notes (Signed)
CSN: [id number]     Arrival date & time 12/31/14  1522 History   First MD Initiated Contact with Patient 12/31/14 1833     Chief Complaint  Patient presents with  . Emesis  . Foot Pain  . Manic Behavior   HPI   Andrew Chaney is a 59 y.o. M PMH significant for bipolar I disorder presenting with feelings of impending doom since December 5. He was discharged from jail, and his brother told the RN (not present at bedside during my evaluation) that the patient has been sleeping in the streets since. Once he went to jail, they stopped administering his medication, and he has gotten "worse" since then. It is difficult to obtain why the patient is here per patient other than "I feel like I am going to die and I felt this way when I had renal failure." The patient told the RN that he has bilateral foot pain, but he denies this with me. He was covered in urine and feces, and is unable to tell me if he lost control of his bowel vs not getting to the restroom quickly enough. He endorses vomiting but is unsure of how many times he has vomited. He denies fevers, chills, CP, abdominal pain, SI/HI, delusions or paranoia.   Past Medical History  Diagnosis Date  . 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 Bipolar 1 disorder ([place])   . Renal disorder    History reviewed. No pertinent past surgical history. History reviewed. No pertinent family history. Social History  Substance Use Topics  . Smoking status: Current Every Day Smoker    Types: Cigarettes  . Smokeless tobacco: None  . Alcohol Use: Yes    Review of Systems  Unable to perform ROS: Mental status change   Allergies  Review of patient's allergies indicates no known allergies.  Home Medications   Prior to Admission medications   Medication Sig Start Date End Date Taking? Authorizing Provider  diazepam (VALIUM) 5 MG tablet Take 1 tablet (5 mg total) by mouth at bedtime as needed and may repeat dose one time if needed for  anxiety. Patient not taking: Reported on 12/31/2014 03/13/13   Junius Creamer, NP  diazepam (VALIUM) 5 MG tablet Take 1 tablet (5 mg total) by mouth at bedtime as needed (insomnia). Patient not taking: Reported on 12/31/2014 07/13/13   Clayton Bibles, PA-C  methocarbamol (ROBAXIN) 500 MG tablet Take 1 tablet (500 mg total) by mouth 2 (two) times daily as needed for muscle spasms. Patient not taking: Reported on 12/31/2014 05/13/13   Elmyra Ricks Pisciotta, PA-C   BP 128/85 mmHg  Pulse 70  Temp(Src) 97.8 F (36.6 C) (Oral)  Resp 18  SpO2 100% Physical Exam  Constitutional: He is oriented to person, place, and time. He appears well-developed and well-nourished. No distress.  HENT:  Head: Normocephalic and atraumatic.  Mouth/Throat: No oropharyngeal exudate.  Oropharynx clear and dry  Eyes: Conjunctivae are normal. Pupils are equal, round, and reactive to light. Right eye exhibits no discharge. Left eye exhibits no discharge. No scleral icterus.  Neck: Normal range of motion. Neck supple. No tracheal deviation present.  Cardiovascular: Normal rate, regular rhythm, normal heart sounds and intact distal pulses.  Exam reveals no gallop and no friction rub.   No murmur heard. Pulmonary/Chest: Effort normal and breath sounds normal. No respiratory distress. He has no wheezes. He has no rales. He exhibits no tenderness.  Abdominal: Soft. Bowel sounds are normal. He exhibits  no distension and no mass. There is no tenderness. There is no rebound and no guarding.  Musculoskeletal: Normal range of motion. He exhibits tenderness. He exhibits no edema.  Tenderness diffusely along lower extremities.  Lymphadenopathy:    He has no cervical adenopathy.  Neurological: He is alert and oriented to person, place, and time. No cranial nerve deficit. Coordination normal.  Skin: Skin is warm and dry. No rash noted. He is not diaphoretic. No erythema.  Psychiatric: He has a normal mood and affect. His behavior is normal.   Nursing note and vitals reviewed.   ED Course  Procedures  Labs Review Labs Reviewed  COMPREHENSIVE METABOLIC PANEL - Abnormal; Notable for the following:    Glucose, Bld 148 (*)    Creatinine, Ser 1.47 (*)    Calcium 10.7 (*)    Total Bilirubin 1.3 (*)    GFR calc non Af Amer 51 (*)    GFR calc Af Amer 59 (*)    All other components within normal limits  CBC  URINALYSIS, ROUTINE W REFLEX MICROSCOPIC (NOT AT [facility])   MDM   Final diagnoses:  Confusion  Urinary tract infection without hematuria, site unspecified   Patient non-toxic but dry appearing and VSS. Will consult TTS and get basic labs. SCr of 1.47, likely due to dehydration. Patient tolerating PO fluids.  Shift handoff to [facility], PA-C at shift change. Dr. Oleta Mouse will see patient as well.      Ruby Lions, PA-C 01/05/15 [id number]  Forde Dandy, MD 01/08/15 [phone]

## 2014-12-31 NOTE — BH Assessment (Signed)
Tele Assessment Note   Andrew Chaney is an 59 y.o. male.  -Clinician spoke to Dr. Brantley Stage who took over care of patient from PA.  She said that patient was dehydrated and is taking fluids well.  Patient's brother saw him out on the street [address]) today and brought him to [employer].  Patient reportedly was released from jail on December 5.  Patient appears confused.  He cannot remember when he was released from jail exactly or why he was in jail in the first place.  He thinks that he went in maybe in September.  Patient says that he used to get ACTT services from [facility] and he thinks that he last saw them in September or August of this year.  Patient says he used to take Respiridal IM every two weeks.  Last time given may have been in September but patient is not sure.    Patient denies current/recurrent SI, HI or A/V hallucinations.  He does appear to be confused.  He reports not remembering why he had been incarcerated but yet can remember the name of his probation officer.  Patient denies current use of illicit drugs and says he used to use drugs but cannot remember the last time he used.  Patient says that he is very anxious but minimizes his depression.  Patient says he has not slept in days.  Patient says that he has not eaten well in awhile and cannot identify whether it is because of lack of appetite or access to food.  Patient did report that he felt too weak currently to walk steadily.  Pt lacks family or social supports.  -Clinician discussed patient care with Patriciaann Clan, PA.  He recommended a geropsychiatric hospital admission to provide stabilization with medications.  Clinician spoke to Comer Locket, [place] about need for labs for patient to be referred to various geropsych facilities.  TTS to seek placement.  Diagnosis:  Axis 1: 308.3 Acute Stress D/O Axis 2: Deferred Axis 3: See H&P Axis 4: psychosocial stressors Axis 5: GAF 28  Past Medical History:  Past Medical  History  Diagnosis Date  . 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 Bipolar 1 disorder ([place])   . Renal disorder     History reviewed. No pertinent past surgical history.  Family History: History reviewed. No pertinent family history.  Social History:  reports that he has been smoking Cigarettes.  He does not have any smokeless tobacco history on file. He reports that he drinks alcohol. He reports that he does not use illicit drugs.  Additional Social History:  Alcohol / Drug Use Pain Medications: None Prescriptions: Risperidal (q 2week injection)  Cannot recall the last time he had it, maybe August. Over the Counter: None History of alcohol / drug use?: No history of alcohol / drug abuse  CIWA: CIWA-Ar BP: 110/79 mmHg Pulse Rate: 80 COWS:    PATIENT STRENGTHS: (choose at least two) Average or above average intelligence Communication skills  Allergies: No Known Allergies  Home Medications:  (Not in a hospital admission)  OB/GYN Status:  No LMP for male patient.  General Assessment Data Location of Assessment: [facility] ED TTS Assessment: In system Is this a Tele or Face-to-Face Assessment?: Tele Assessment Is this an Initial Assessment or a Re-assessment for this encounter?: Initial Assessment Marital status: Single Is patient pregnant?: No Pregnancy Status: No Living Arrangements: Other (Comment) (Homeless) Can pt return to current living arrangement?: Yes Admission Status:  Voluntary Is patient capable of signing voluntary admission?: Yes Referral Source: Self/Family/Friend Insurance type: MCD     Crisis Care Plan Living Arrangements: Other (Comment) (Homeless) Name of Psychiatrist: None Name of Therapist: None  Education Status Is patient currently in school?: No Highest grade of school patient has completed: Pt cannot remember.  Risk to self with the past 6 months Suicidal Ideation: No Has patient been a risk to self within the past 6 months prior to  admission? : No Suicidal Intent: No Has patient had any suicidal intent within the past 6 months prior to admission? : No Is patient at risk for suicide?: No Suicidal Plan?: No Has patient had any suicidal plan within the past 6 months prior to admission? : No Access to Means: No What has been your use of drugs/alcohol within the last 12 months?: None Previous Attempts/Gestures: No How many times?: 0 Other Self Harm Risks: None noted Triggers for Past Attempts: None known Intentional Self Injurious Behavior: None Family Suicide History: No Recent stressful life event(s): Legal Issues, Financial Problems (Pt is homeless.) Persecutory voices/beliefs?: No Depression: Yes Depression Symptoms: Despondent, Loss of interest in usual pleasures, Insomnia, Isolating Substance abuse history and/or treatment for substance abuse?: No Suicide prevention information given to non-admitted patients: Not applicable  Risk to Others within the past 6 months Homicidal Ideation: No Does patient have any lifetime risk of violence toward others beyond the six months prior to admission? : No Thoughts of Harm to Others: No Current Homicidal Intent: No Current Homicidal Plan: No Access to Homicidal Means: No Identified Victim: No one History of harm to others?: No Assessment of Violence: None Noted Violent Behavior Description: Pt denies Does patient have access to weapons?: No Criminal Charges Pending?: No Does patient have a court date: No Is patient on probation?: No (PO is officer Frann Rider)  Psychosis Hallucinations: None noted Delusions: None noted  Mental Status Report Appearance/Hygiene: Disheveled, Body odor, Poor hygiene, In scrubs Eye Contact: Good Motor Activity: Unsteady Speech: Logical/coherent Level of Consciousness: Alert Mood: Depressed, Helpless Affect: Blunted, Sad Anxiety Level: Severe Thought Processes: Coherent, Relevant Judgement: Unimpaired Orientation:  Situation, Place, Person Obsessive Compulsive Thoughts/Behaviors: None  Cognitive Functioning Concentration: Poor Memory: Recent Impaired, Remote Impaired IQ: Average Insight: Poor Impulse Control: Fair Appetite: Poor Weight Loss:  (Poor appetite and lack of access to food.) Weight Gain: 0 Sleep: Decreased Total Hours of Sleep:  (<4H/D) Vegetative Symptoms: None  ADLScreening [facility] Assessment Services) Patient's cognitive ability adequate to safely complete daily activities?: Yes Patient able to express need for assistance with ADLs?: Yes Independently performs ADLs?: Yes (appropriate for developmental age)  Prior Inpatient Therapy Prior Inpatient Therapy: No Prior Therapy Dates: Pt is unsure Prior Therapy Facilty/Provider(s): Pt is unsure Reason for Treatment: Pt is unsure  Prior Outpatient Therapy Prior Outpatient Therapy: Yes Prior Therapy Dates: Last contact in Sept '16 Prior Therapy Facilty/Provider(s): [facility] Reason for Treatment: ACTT team Does patient have an ACCT team?: No Does patient have Intensive In-House Services?  : No Does patient have [facility] services? : No Does patient have P4CC services?: No  ADL Screening (condition at time of admission) Patient's cognitive ability adequate to safely complete daily activities?: Yes Is the patient deaf or have difficulty hearing?: No Does the patient have difficulty seeing, even when wearing glasses/contacts?: Yes (Wears reading glasses.) Does the patient have difficulty concentrating, remembering, or making decisions?: No Patient able to express need for assistance with ADLs?: Yes Does the patient have difficulty dressing  or bathing?: No Independently performs ADLs?: Yes (appropriate for developmental age) Does the patient have difficulty walking or climbing stairs?: No (Pt says he feels weak & unsteady.) Weakness of Legs: Both (currently unsteady) Weakness of Arms/Hands: Both  Home Assistive  Devices/Equipment Home Assistive Devices/Equipment: None    Abuse/Neglect Assessment (Assessment to be complete while patient is alone) Physical Abuse: Denies Verbal Abuse: Denies Sexual Abuse: Denies Exploitation of patient/patient's resources: Denies Self-Neglect: Denies     Regulatory affairs officer (For Healthcare) Does patient have an advance directive?: No Would patient like information on creating an advanced directive?: No - patient declined information    Additional Information 1:1 In Past 12 Months?: No CIRT Risk: No Elopement Risk: No Does patient have medical clearance?: Yes     Disposition:  Disposition Initial Assessment Completed for this Encounter: Yes Disposition of Patient: Inpatient treatment program, Referred to Type of inpatient treatment program: Adult Patient referred to:  (Pt to be reviewed by PA.)  Curlene Dolphin Ray 12/31/2014 11:07 PM

## 2014-12-31 NOTE — ED Notes (Signed)
TTS still in progress at this time.

## 2015-01-01 ENCOUNTER — Emergency Department (HOSPITAL_COMMUNITY): Payer: Medicaid Other

## 2015-01-01 LAB — URINE MICROSCOPIC-ADD ON: RBC / HPF: NONE SEEN RBC/hpf (ref 0–5)

## 2015-01-01 LAB — URINALYSIS, ROUTINE W REFLEX MICROSCOPIC
GLUCOSE, UA: NEGATIVE mg/dL
HGB URINE DIPSTICK: NEGATIVE
Ketones, ur: 15 mg/dL — AB
Nitrite: NEGATIVE
PH: 5.5 (ref 5.0–8.0)
Protein, ur: 30 mg/dL — AB
Specific Gravity, Urine: 1.029 (ref 1.005–1.030)

## 2015-01-01 LAB — RAPID URINE DRUG SCREEN, HOSP PERFORMED
AMPHETAMINES: NOT DETECTED
BARBITURATES: NOT DETECTED
BENZODIAZEPINES: NOT DETECTED
Cocaine: NOT DETECTED
Opiates: NOT DETECTED
Tetrahydrocannabinol: POSITIVE — AB

## 2015-01-01 LAB — BASIC METABOLIC PANEL
ANION GAP: 6 (ref 5–15)
BUN: 17 mg/dL (ref 6–20)
CHLORIDE: 109 mmol/L (ref 101–111)
CO2: 25 mmol/L (ref 22–32)
Calcium: 8.6 mg/dL — ABNORMAL LOW (ref 8.9–10.3)
Creatinine, Ser: 0.91 mg/dL (ref 0.61–1.24)
GFR calc Af Amer: >60 mL/min (ref >60)
Glucose, Bld: 90 mg/dL (ref 65–99)
POTASSIUM: 3.8 mmol/L (ref 3.5–5.1)
SODIUM: 140 mmol/L (ref 135–145)

## 2015-01-01 MED ORDER — SODIUM CHLORIDE 0.9 % IV SOLN
INTRAVENOUS | Status: DC
  Administered 2015-01-01 – 2015-01-03 (×3): via INTRAVENOUS

## 2015-01-01 MED ORDER — ACETAMINOPHEN 500 MG PO TABS
1000.0000 mg | ORAL_TABLET | Freq: Four times a day (QID) | ORAL | Status: DC | PRN
  Administered 2015-01-01: 1000 mg via ORAL
  Filled 2015-01-01 (×2): qty 2

## 2015-01-01 MED ORDER — SODIUM CHLORIDE 0.9 % IV BOLUS (SEPSIS)
2000.0000 mL | Freq: Once | INTRAVENOUS | Status: AC
  Administered 2015-01-01: 2000 mL via INTRAVENOUS

## 2015-01-01 MED ORDER — SODIUM CHLORIDE 0.9 % IV BOLUS (SEPSIS)
1000.0000 mL | Freq: Once | INTRAVENOUS | Status: AC
  Administered 2015-01-01: 1000 mL via INTRAVENOUS

## 2015-01-01 NOTE — Progress Notes (Signed)
Referral was followed up at: [facility]- per [facility], follow up in am. [facility]- day shift will be back in am for follow up. Per [facility], accepting referrals. [facility] - left voicemail.  Declined: Hospital doctor- unable to accept adult MCD as payor source [facility] unit requires dx of dementia [place]- no inpatient psychiatric criteria per Nunzio Cory- "pt presents confused and dehydrated" [facility]- due acuity, per Mendel Ryder  At capacity: [place] will continue to seek placement.  Verlon Setting, [place] Disposition staff 01/01/2015 9:20 PM

## 2015-01-01 NOTE — ED Notes (Signed)
Pt not eating.  Encouraged pt to sit up in bed and walk Sat with pt for 5 minutes and tried to help him eat, but he refused.  Pt refused stating, I just can't do it.  I don't know why, but I just can't.

## 2015-01-01 NOTE — ED Provider Notes (Signed)
Patient seen due to mild hypotension. Was given IV fluids and has improved. Repeat renal functions shows improvement of his creatinine from 1.47 packed within normal limits. Suspect that his renal insufficiency was from dehydration. He is alert and oriented 3. We'll continue to monitor and encourage patient to drink liquids  Lacretia Leigh, MD 01/01/15 2213

## 2015-01-01 NOTE — ED Notes (Signed)
Attempted to ambulate pt to shower.  Pt stated he wanted to "wait" so that he could take a nap.  I had pt sit on side of bed for several minutes until he begged me to lay back down.  Diaper is dry and pt has not voided or had bm since I came on this am.  Pt also stated he was covered in stool, but when diaper was assessed, pt was clean.

## 2015-01-01 NOTE — ED Notes (Signed)
Regular diet was ordered for lunch at 11am.

## 2015-01-01 NOTE — ED Notes (Signed)
PT took 5 more sips of sprite after my insistence.  Pt states he is dehydrated and his body can't tolerate fluids.  Pt finally voided 175 ml.  Pt only ate 3 bites of lunch and stated he can't eat - that his body won't tolerate it.

## 2015-01-01 NOTE — ED Notes (Signed)
Spoke with [place] PA regarding pt and resistance to eating/drinking.  PA suggests to keep encouraging fluids as pt will allow.

## 2015-01-01 NOTE — ED Notes (Signed)
RN went into room to encourage pt to eat dinner and drink fluids.  Pt refused to eat or drink anything at this time.  Pt is still receiving IV fluids at this time.  RN offered toileting but pt denied having to urinate.

## 2015-01-01 NOTE — Progress Notes (Signed)
Seeking inpatient gero-psych treatment for pt at recommendation of psychiatry.  Referred to: [facility]- send referral per Otelia Sant[facility]. [facility]- send referral per [facility]- left voicemail inquiring as to status of referral reportedly sent this morning by TTS Left voicemail with Mayer Camel and will refer if there is bed availability.   Declined: Hospital doctor- unable to accept adult MCD as payor source [place] unit requires dx of dementia [place]- no inpatient psychiatric criteria per Nunzio Cory- "pt presents confused and dehydrated"  At capacity: [place]

## 2015-01-01 NOTE — ED Notes (Signed)
The patient is unable to give an urine specimen at his time. The RN is aware.

## 2015-01-01 NOTE — ED Notes (Signed)
Provided urinal to patient and requested patient to provide urine specimen. Patient just stared at RN. Explained that since patient has not urinated in several hours that if he is unable to urinate it would be necessary to in & out cath patient. Patient verbalized understanding, but stated "there's just not a lot of urine floating around." Encouraged patient to attempt to void in urinal.

## 2015-01-01 NOTE — ED Notes (Signed)
Encouraging pt to take shower.  He states is worried stool "won't come off".

## 2015-01-02 NOTE — ED Notes (Signed)
Assisted pt with his dinner order.  After listing over 30 food options, all met with, "No," or "Oh, no" the pt finally responded to one item, cookies!  He asked which kind, and then chose chocolate chip.  After his almost total refusal of food since his arrival in [place], this ED Tech was thrilled to find something he actually wanted.  Yesterday's day shift tech was able to convince him to eat a [place] sandwich with a Sprite.

## 2015-01-02 NOTE — Progress Notes (Addendum)
Geropsychiatric hospital admission has been recommended to provide stabilization with medications, per PA [facility].  Patient referred to [facility]. [place] authorization number: [id number] effective 01/02/15 through 01/08/15. Demographics given to [facility].  Patient has been referred and followed up at: [facility] - per [facility], by midnight will have beds for everybody, referral faxed. [facility] - per [employer], fax referral.  [facility] - per Erline Levine, will look at referrals, refax. [facility] - per [place], has beds for everyone, fax it. [facility] - per intake, fax referral for the waitlist. [facility] per Sharene Butters, follow up in am.  Declined at: [facility]- unable to accept adult MCD as payor source [place] unit requires dx of dementia [place] - per Shirlee Limerick, fax referral for review. Writer waiting for the chest xray so that referral can be completed. RN informed. [facility] - due acuity, per Mendel Ryder  At capacity: [facility] - per Lattie Haw  CSW will continue to seek placement and follow up with referrals.  Verlon Setting, [place] Disposition staff 01/02/2015 8:00 PM

## 2015-01-02 NOTE — ED Notes (Signed)
Patient was given a sprite. 

## 2015-01-02 NOTE — ED Notes (Signed)
Xeroform and gauze placed on wound to right hip

## 2015-01-02 NOTE — ED Notes (Signed)
Pt states he does not want to eat anything for lunch. He is resting quietly at the time. Vital signs stable. NAD

## 2015-01-02 NOTE — ED Notes (Signed)
Informed provider about urine output.

## 2015-01-02 NOTE — ED Notes (Signed)
Pt eating sandwich and drinking sprite at the time. Vital signs stable. Pt is calm and cooperative.

## 2015-01-02 NOTE — ED Notes (Signed)
Pt ate 2 sugar cookies from his dinner tray.

## 2015-01-02 NOTE — ED Notes (Signed)
Pt refused breakfast tray.  

## 2015-01-03 DIAGNOSIS — R410 Disorientation, unspecified: Secondary | ICD-10-CM

## 2015-01-03 MED ORDER — ENSURE ENLIVE PO LIQD
237.0000 mL | Freq: Three times a day (TID) | ORAL | Status: DC
  Filled 2015-01-03: qty 237

## 2015-01-03 MED ORDER — CEPHALEXIN 250 MG PO CAPS
500.0000 mg | ORAL_CAPSULE | Freq: Four times a day (QID) | ORAL | Status: DC
  Administered 2015-01-03 – 2015-01-04 (×4): 500 mg via ORAL
  Filled 2015-01-03 (×4): qty 2

## 2015-01-03 MED ORDER — CEPHALEXIN 250 MG PO CAPS
500.0000 mg | ORAL_CAPSULE | Freq: Once | ORAL | Status: AC
  Administered 2015-01-03: 500 mg via ORAL
  Filled 2015-01-03: qty 2

## 2015-01-03 NOTE — Consult Note (Signed)
Telepsych Consultation   Reason for Consult:  confusion vs delirium  Referring Physician:  [facility] Patient Identification: Andrew Chaney MRN:  [id number] Principal Diagnosis: Confusion Diagnosis:   Patient Active Problem List   Diagnosis Date Noted  . Confusion [R41.0] 01/03/2015    Priority: High  . Chronic hepatitis C ([place]) [B18.2] 12/20/2012  . Renal failure [N19] 12/17/2012  . Acute encephalopathy [G93.40] 12/17/2012  . ARF (acute renal failure) ([place]) [N17.9] 12/17/2012  . Hyponatremia [E87.1] 12/17/2012  . CAP (community acquired pneumonia) [J18.9] 12/17/2012  . Rhabdomyolysis [M62.82] 12/17/2012  . 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 male patient admitted with confusion.  HPI:  Andrew Chaney is an 59 y.o. Male who is currently at [facility].  It is unclear how his brother found him on the streets [facility]) and took him to [employer].  It was reported that he was jailed.  When patient was asked today via tele psych, he could not remember.  He also could not remember why he was brought in to hospital.  When asked where he would go after discharge, he stated that he didnt know.    He appears confused.  It was also unclear when the last time he was seen by a mental health professional or the last time he had been on psychotropic meds. Patient denies current/recurrent SI, HI or A/V hallucinations.  His UDS positive for THC.  HPI Elements:   See HPI  Past Medical History:  Past Medical History  Diagnosis Date  . 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 Bipolar 1 disorder ([place])   . Renal disorder    History reviewed. No pertinent past surgical history. Family History: History reviewed. No pertinent family history. Social History:  History  Alcohol Use  . Yes     History  Drug Use No    Comment: States no longer uses marijuana because he is "on probation"    Social History    Social History  . Marital Status: Single    Spouse Name: N/A  . Number of Children: N/A  . Years of Education: N/A   Social History Main Topics  . Smoking status: Current Every Day Smoker    Types: Cigarettes  . Smokeless tobacco: None  . Alcohol Use: Yes  . Drug Use: No     Comment: States no longer uses marijuana because he is "on probation"  . Sexual Activity: Not Currently   Other Topics Concern  . None   Social History Narrative   Additional Social History:    Pain Medications: None Prescriptions: Risperidal (q 2week injection)  Cannot recall the last time he had it, maybe August. Over the Counter: None History of alcohol / drug use?: No history of alcohol / drug abuse  Allergies:  No Known Allergies  Labs:  Results for orders placed or performed during the hospital encounter of 12/31/14 (from the past 48 hour(s))  Basic metabolic panel     Status: Abnormal   Collection Time: 01/01/15  8:39 PM  Result Value Ref Range   Sodium 140 135 - 145 mmol/L   Potassium 3.8 3.5 - 5.1 mmol/L   Chloride 109 101 - 111 mmol/L   CO2 25 22 - 32 mmol/L   Glucose, Bld 90 65 - 99 mg/dL   BUN 17 6 - 20 mg/dL   Creatinine, Ser 0.91 0.61 -  1.24 mg/dL   Calcium 8.6 (L) 8.9 - 10.3 mg/dL    Comment: DELTA CHECK NOTED REPEATED TO VERIFY    GFR calc non Af Amer >60 >60 mL/min   GFR calc Af Amer >60 >60 mL/min    Comment: (NOTE) The eGFR has been calculated using the CKD EPI equation. This calculation has not been validated in all clinical situations. eGFR's persistently <60 mL/min signify possible Chronic Kidney Disease.    Anion gap 6 5 - 15    Vitals: Blood pressure 135/87, pulse 64, temperature 98.3 F (36.8 C), temperature source Oral, resp. rate 20, SpO2 98 %.  Risk to Self: Suicidal Ideation: No Suicidal Intent: No Is patient at risk for suicide?: No Suicidal Plan?: No Access to Means: No What has been your use of drugs/alcohol within the last 12 months?: None How  many times?: 0 Other Self Harm Risks: None noted Triggers for Past Attempts: None known Intentional Self Injurious Behavior: None Risk to Others: Homicidal Ideation: No Thoughts of Harm to Others: No Current Homicidal Intent: No Current Homicidal Plan: No Access to Homicidal Means: No Identified Victim: No one History of harm to others?: No Assessment of Violence: None Noted Violent Behavior Description: Pt denies Does patient have access to weapons?: No Criminal Charges Pending?: No Does patient have a court date: No Prior Inpatient Therapy: Prior Inpatient Therapy: No Prior Therapy Dates: Pt is unsure Prior Therapy Facilty/Provider(s): Pt is unsure Reason for Treatment: Pt is unsure Prior Outpatient Therapy: Prior Outpatient Therapy: Yes Prior Therapy Dates: Last contact in Sept '16 Prior Therapy Facilty/Provider(s): [facility] Reason for Treatment: ACTT team Does patient have an ACCT team?: No Does patient have Intensive In-House Services?  : No Does patient have [facility] services? : No Does patient have P4CC services?: No  Current Facility-Administered Medications  Medication Dose Route Frequency Provider Last Rate Last Dose  . 0.9 %  sodium chloride infusion   Intravenous Continuous Lacretia Leigh, MD   Stopped at 01/03/15 1347  . acetaminophen (TYLENOL) tablet 1,000 mg  1,000 mg Oral Q6H PRN Deno Etienne, DO   1,000 mg at 01/01/15 1139  . feeding supplement (ENSURE ENLIVE) (ENSURE ENLIVE) liquid 237 mL  237 mL Oral TID BM Provider Not In System   237 mL at 01/03/15 1034   Current Outpatient Prescriptions  Medication Sig Dispense Refill  . diazepam (VALIUM) 5 MG tablet Take 1 tablet (5 mg total) by mouth at bedtime as needed and may repeat dose one time if needed for anxiety. (Patient not taking: Reported on 12/31/2014) 10 tablet 0  . diazepam (VALIUM) 5 MG tablet Take 1 tablet (5 mg total) by mouth at bedtime as needed (insomnia). (Patient not taking: Reported on  12/31/2014) 10 tablet 0  . methocarbamol (ROBAXIN) 500 MG tablet Take 1 tablet (500 mg total) by mouth 2 (two) times daily as needed for muscle spasms. (Patient not taking: Reported on 12/31/2014) 20 tablet 0    Musculoskeletal: Strength & Muscle Tone: within normal limits Gait & Station: normal Patient leans: N/A  Psychiatric Specialty Exam: Physical Exam  Vitals reviewed.   ROS  Blood pressure 135/87, pulse 64, temperature 98.3 F (36.8 C), temperature source Oral, resp. rate 20, SpO2 98 %.There is no weight on file to calculate BMI.  General Appearance: Negative  Eye Contact::  Fair  Speech:  Clear and Coherent  Volume:  Normal  Mood:  Anxious  Affect:  Appropriate  Thought Process:  Disorganized  Orientation:  Full (  Time, Place, and Person)  Thought Content:  Rumination  Suicidal Thoughts:  No  Homicidal Thoughts:  No  Memory:  Immediate;   Poor Recent;   Poor Remote;   Poor  Judgement:  Impaired  Insight:  Lacking  Psychomotor Activity:  Normal  Concentration:  Poor  Recall:  Poor  Fund of Knowledge:Poor  Language: Poor  Akathisia:  Negative  Handed:  Right  AIMS (if indicated):     Assets:  Resilience  ADL's:  Intact  Cognition:  impaired  Sleep:   poor   Medical Decision Making: Established Problem, Stable/Improving (1), Review of Psycho-Social Stressors (1), Discuss test with performing physician (1) and Review and summation of old records (2)   Treatment Plan Summary: Daily contact with patient to assess and evaluate symptoms and progress in treatment and Plan no inpatient criteria met.  Discussed with Dr Christy Gentles with [employer] NP that patient has bacteria evident in urine.  Treatment for UTI could help patient be less confused and be more participatory with assessment.    Plan:  No evidence of imminent risk to self or others at present.   Patient does not meet criteria for psychiatric inpatient admission. Supportive therapy provided about ongoing  stressors. Discussed crisis plan, support from social network, calling 911, coming to the Emergency Department, and calling Suicide Hotline. Disposition: home  [place] AGNP-BC 01/03/2015 2:52 PM

## 2015-01-03 NOTE — Progress Notes (Signed)
CSW engaged with patient at his bedside. CSW provided patient with resources for local shelters and [facility] contact information. CSW also provided patient with resources for outpatient mental health programs in the area. Patient was given a bus pass for home.   Holly Bodily, [place]

## 2015-01-03 NOTE — ED Notes (Signed)
Pt ate all of his sandwich, part of tomato soup.

## 2015-01-03 NOTE — Progress Notes (Signed)
Spoke with Andrew Chaney with intake at [facility]. States they need H&P and commitment paperwork copies. CSW faxed all ED notes and stated pt is not under IVC at this time. Pt will need to be under IVC for consideration for [facility].   Sharren Bridge, MSW, LCSW Clinical Social Work, Disposition  01/03/2015 [phone]

## 2015-01-03 NOTE — ED Notes (Signed)
Pt requesting snack; cookies, rice krispy treat and sprite given.

## 2015-01-03 NOTE — Progress Notes (Signed)
RN Opal Sidles at [facility] requested that the attending EDP orders an Albumin score and a PPD test (both skin and blood test). RN Valarie Merino informed.  Verlon Setting, [place] Disposition staff 01/03/2015 8:41 PM

## 2015-01-03 NOTE — ED Provider Notes (Signed)
D/w conrad at [place] increasing PO fluids Treat UTI  Re-assess on 12/24 to see if he meets inpatient psych criteria  Ripley Fraise, MD 01/03/15 [phone]

## 2015-01-03 NOTE — ED Provider Notes (Signed)
BP 96/60 mmHg  Pulse 59  Temp(Src) 98.2 F (36.8 C) (Oral)  Resp 18  SpO2 98% Pt resting comfortably He has no complaints and is in no distress Awaiting placement   Ripley Fraise, MD 01/03/15 [phone]

## 2015-01-03 NOTE — ED Notes (Signed)
Clothing changed, pt reports leaking urine on pants.  Pt reports he is unable to stand because he is "in bad shape".  Pt changed while lying on stretcher, perineal area noted to be red and painful with stage 2 breakdown noted on L side.  Skin cleaned, dried with barrier cream applied.

## 2015-01-03 NOTE — ED Notes (Addendum)
Patient refused a snack, sipping on a cup of  Sprite. Diet order was ordered for patient for lunch.

## 2015-01-04 MED ORDER — CEPHALEXIN 500 MG PO CAPS: 500.0000 mg | ORAL_CAPSULE | Freq: Three times a day (TID) | ORAL | Status: DC

## 2015-01-04 NOTE — Care Management Note (Deleted)
Case Management Note  Patient Details  Name: Andrew Chaney MRN: [id number] Date of Birth: Oct 21, 1955  Subjective/Objective:  CM received call from ED RN re: Medication assist for this 59 y.o. M who has been in the ED since                   Action/Plan: pt provided [facility] letter.    Expected Discharge Date:                  Expected Discharge Plan:     In-House Referral:     Discharge planning Services  CM Consult, Medication Assistance  Post Acute Care Choice:    Choice offered to:  Patient  DME Arranged:    DME Agency:     HH Arranged:    [place] Agency:     Status of Service:  Completed, signed off  Medicare Important Message Given:    Date Medicare IM Given:    Medicare IM give by:    Date Additional Medicare IM Given:    Additional Medicare Important Message give by:     If discussed at [place] of Stay Meetings, dates discussed:    Additional Comments:  Delrae Sawyers, RN 01/04/2015, 10:14 AM

## 2015-01-04 NOTE — ED Notes (Signed)
States has $3 to get rx filled. Voiced understanding of importance in obtaining rx for antibiotic and completing it.

## 2015-01-04 NOTE — Care Management Note (Signed)
Case Management Note  Patient Details  Name: Jacque [place] MRN: [id number] Date of Birth: July 12, 1955  Subjective/Objective:  CM received consult for Medication assistance for this 59 y.o. M who has been in the ED since 12/31/2014. Pt stated he was unable to afford his Keflex. CSW has provided all resources for out-pt follow-up at [facility] and Homelessness. Pt also has Bus pass.                   Action/Plan:  [place]  ED CM consulted by EDP, for medication assistance   CM reviewed EPIC notes and chart review information CM spoke with the pt about [facility] MATCH program ($3 co pay for each Rx through [facility] program, does not include refills, 7 day expiration of MATCH letter and choice of pharmacies) Pt agreed to receive assistance from program    Pt is eligible for [facility] MATCH program (unable to find pt listed in PDMI per cardholder name inquiry)  PDMI information entered. [place] letter completed and provided to pt.   CM updated EDP and ED RN   Pending confirmation of d/c medication list     Expected Discharge Date:                  Expected Discharge Plan:     In-House Referral:     Discharge planning Services  CM Consult, Medication Assistance  Post Acute Care Choice:    Choice offered to:  Patient  DME Arranged:    DME Agency:     HH Arranged:    [place] Agency:     Status of Service:  Completed, signed off  Medicare Important Message Given:    Date Medicare IM Given:    Medicare IM give by:    Date Additional Medicare IM Given:    Additional Medicare Important Message give by:     If discussed at [place] of Stay Meetings, dates discussed:    Additional Comments:  Delrae Sawyers, RN 01/04/2015, 10:47 AM

## 2015-01-04 NOTE — ED Notes (Signed)
Pt awake med given  Pt not wanting anything to eat or drink at present

## 2015-01-04 NOTE — ED Notes (Signed)
Pt sleeping.  Foul  Smell in the room

## 2015-01-04 NOTE — ED Notes (Signed)
Pt states he received paperwork re: shelters and [facility] yesterday along w/bus pass from [place] states does not have any insurance, Medicaid or any other way to obtain Keflex for UTI. Advised pt will consult CM.

## 2015-01-04 NOTE — ED Notes (Addendum)
Pt remains lying on stretcher when RN and EMT entered room - pt has not cleansed himself. RN and EMT assisted pt. Pt's pants soiled. Assisted pt w/putting on new jeans given. Pt initially refused to put them on - stating they were too small. After much encouragement, pt allowed for RN to assist. Pt then c/o needed his belt d/t pants were falling down. Pt put on belt. Pants appeared to fit pt well. RN assisted pt w/putting on shirts x 2. Pt had $20, $5 and change in a cup - he placed these and his black wallet in his pants pocket. Pt given 3 belongings bags placed into one w/his personal clothing, new shirt and shorts and toiletries. Pt's rx and d/c paperwork placed in bag also along w/paperwork from SW and Systems developer. Pt stated he cannot carry this bag nor his black coat - would not give explanation. Advised pt these were his belongings which belong to him. Pt escorted to lobby via w/c - security standing by. Pt did not want VS to be taken again prior to d/c.

## 2015-01-04 NOTE — Discharge Instructions (Signed)

## 2015-01-04 NOTE — ED Notes (Signed)
Pt sleeping  He does not appear to have moved

## 2015-01-04 NOTE — ED Notes (Signed)
Patient has a drink and rice crispy treat at bedside. Grill cheese sandwich and soup and peaches ordered for patient for lunch.

## 2015-01-04 NOTE — ED Notes (Addendum)
Upon discharging pt, he advised his clothing is soiled. Offered for pt to wear blue scrubs. Pt states has "defecated on myself" - pt noted to be wearing briefs. Asked pt if this is normal for him - advised no. Wash cloths, towels and soap given to pt. Pt refused new brief offered. $5 Gift card given to pt from Case Management.

## 2015-01-08 LAB — QUANTIFERON IN TUBE
QFT TB AG MINUS NIL VALUE: <0 IU/mL
QUANTIFERON MITOGEN VALUE: >10 IU/mL
QUANTIFERON NIL VALUE: 0.08 [IU]/mL
QUANTIFERON TB AG VALUE: 0.05 [IU]/mL
QUANTIFERON TB GOLD: NEGATIVE

## 2015-01-08 LAB — QUANTIFERON TB GOLD ASSAY (BLOOD)

## 2018-03-20 ENCOUNTER — Other Ambulatory Visit: Payer: Self-pay

## 2018-03-20 ENCOUNTER — Emergency Department (HOSPITAL_COMMUNITY): Payer: Self-pay

## 2018-03-20 ENCOUNTER — Emergency Department (HOSPITAL_COMMUNITY)
Admission: EM | Admit: 2018-03-20 | Discharge: 2018-03-21 | Disposition: A | Discharge status: Home / Self Care | Payer: Self-pay | Attending: Emergency Medicine | Admitting: Emergency Medicine

## 2018-03-20 DIAGNOSIS — Y929 Unspecified place or not applicable: Secondary | ICD-10-CM | POA: Insufficient documentation

## 2018-03-20 DIAGNOSIS — R4182 Altered mental status, unspecified: Secondary | ICD-10-CM | POA: Insufficient documentation

## 2018-03-20 DIAGNOSIS — Z8547 Personal history of malignant neoplasm of testis: Secondary | ICD-10-CM | POA: Insufficient documentation

## 2018-03-20 DIAGNOSIS — F319 Bipolar disorder, unspecified: Secondary | ICD-10-CM | POA: Insufficient documentation

## 2018-03-20 DIAGNOSIS — Y998 Other external cause status: Secondary | ICD-10-CM | POA: Insufficient documentation

## 2018-03-20 DIAGNOSIS — S0031XA Abrasion of nose, initial encounter: Secondary | ICD-10-CM | POA: Insufficient documentation

## 2018-03-20 DIAGNOSIS — Y9389 Activity, other specified: Secondary | ICD-10-CM | POA: Insufficient documentation

## 2018-03-20 DIAGNOSIS — F17210 Nicotine dependence, cigarettes, uncomplicated: Secondary | ICD-10-CM | POA: Insufficient documentation

## 2018-03-20 DIAGNOSIS — S62309A Unspecified fracture of unspecified metacarpal bone, initial encounter for closed fracture: Secondary | ICD-10-CM

## 2018-03-20 DIAGNOSIS — S62303A Unspecified fracture of third metacarpal bone, left hand, initial encounter for closed fracture: Secondary | ICD-10-CM | POA: Insufficient documentation

## 2018-03-20 DIAGNOSIS — S62307A Unspecified fracture of fifth metacarpal bone, left hand, initial encounter for closed fracture: Secondary | ICD-10-CM | POA: Insufficient documentation

## 2018-03-20 DIAGNOSIS — S62305A Unspecified fracture of fourth metacarpal bone, left hand, initial encounter for closed fracture: Secondary | ICD-10-CM | POA: Insufficient documentation
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
## 2018-03-20 NOTE — Progress Notes (Signed)
Orthopedic Tech Progress Note Patient Details:  Andrew Chaney 1955/03/25 [id number]  Ortho Devices Type of Ortho Device: Arm sling, Volar splint Ortho Device/Splint Location: lue.  Ortho Device/Splint Interventions: Ordered, Application, Adjustment   Post Interventions Patient Tolerated: Well Instructions Provided: Care of device, Adjustment of device   Karolee Stamps 03/20/2018, 10:48 PM
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
## 2018-03-23 ENCOUNTER — Emergency Department (HOSPITAL_COMMUNITY)
Admission: EM | Admit: 2018-03-23 | Discharge: 2018-03-23 | Disposition: A | Discharge status: Home / Self Care | Payer: Medicaid Other | Attending: Emergency Medicine | Admitting: Emergency Medicine

## 2018-03-23 ENCOUNTER — Encounter (HOSPITAL_COMMUNITY): Payer: Self-pay | Admitting: Emergency Medicine

## 2018-03-23 ENCOUNTER — Other Ambulatory Visit: Payer: Self-pay

## 2018-03-23 DIAGNOSIS — S62202A Unspecified fracture of first metacarpal bone, left hand, initial encounter for closed fracture: Secondary | ICD-10-CM | POA: Insufficient documentation

## 2018-03-23 DIAGNOSIS — Y929 Unspecified place or not applicable: Secondary | ICD-10-CM | POA: Insufficient documentation

## 2018-03-23 DIAGNOSIS — Y999 Unspecified external cause status: Secondary | ICD-10-CM | POA: Insufficient documentation

## 2018-03-23 DIAGNOSIS — F17210 Nicotine dependence, cigarettes, uncomplicated: Secondary | ICD-10-CM | POA: Insufficient documentation

## 2018-03-23 DIAGNOSIS — Y939 Activity, unspecified: Secondary | ICD-10-CM | POA: Insufficient documentation

## 2018-03-23 DIAGNOSIS — S62309A Unspecified fracture of unspecified metacarpal bone, initial encounter for closed fracture: Secondary | ICD-10-CM

## 2018-03-23 DIAGNOSIS — X58XXXA Exposure to other specified factors, initial encounter: Secondary | ICD-10-CM | POA: Insufficient documentation
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
## 2018-03-23 NOTE — ED Notes (Addendum)
ACE wrap applied. Pt left with [employer]

## 2018-04-04 ENCOUNTER — Other Ambulatory Visit: Payer: Self-pay

## 2018-04-04 ENCOUNTER — Encounter (HOSPITAL_COMMUNITY): Payer: Self-pay | Admitting: Emergency Medicine

## 2018-04-04 ENCOUNTER — Emergency Department (HOSPITAL_COMMUNITY)
Admission: EM | Admit: 2018-04-04 | Discharge: 2018-04-05 | Disposition: A | Discharge status: Home / Self Care | Payer: Medicaid Other | Attending: Emergency Medicine | Admitting: Emergency Medicine

## 2018-04-04 ENCOUNTER — Other Ambulatory Visit (HOSPITAL_COMMUNITY): Payer: Self-pay | Admitting: Radiology

## 2018-04-04 DIAGNOSIS — E860 Dehydration: Secondary | ICD-10-CM

## 2018-04-04 DIAGNOSIS — Y939 Activity, unspecified: Secondary | ICD-10-CM | POA: Insufficient documentation

## 2018-04-04 DIAGNOSIS — Y998 Other external cause status: Secondary | ICD-10-CM | POA: Insufficient documentation

## 2018-04-04 DIAGNOSIS — F319 Bipolar disorder, unspecified: Secondary | ICD-10-CM | POA: Insufficient documentation

## 2018-04-04 DIAGNOSIS — S0181XA Laceration without foreign body of other part of head, initial encounter: Secondary | ICD-10-CM | POA: Insufficient documentation

## 2018-04-04 DIAGNOSIS — R451 Restlessness and agitation: Secondary | ICD-10-CM | POA: Insufficient documentation

## 2018-04-04 DIAGNOSIS — Z590 Homelessness: Secondary | ICD-10-CM | POA: Insufficient documentation

## 2018-04-04 DIAGNOSIS — Y929 Unspecified place or not applicable: Secondary | ICD-10-CM | POA: Insufficient documentation

## 2018-04-04 DIAGNOSIS — S0990XA Unspecified injury of head, initial encounter: Secondary | ICD-10-CM | POA: Insufficient documentation

## 2018-04-04 LAB — COMPREHENSIVE METABOLIC PANEL
ALT: 42 U/L (ref 0–44)
AST: 41 U/L (ref 15–41)
Albumin: 4 g/dL (ref 3.5–5.0)
Alkaline Phosphatase: 49 U/L (ref 38–126)
Anion gap: 16 — ABNORMAL HIGH (ref 5–15)
BUN: 48 mg/dL — AB (ref 6–20)
CO2: 25 mmol/L (ref 22–32)
Calcium: 10.2 mg/dL (ref 8.9–10.3)
Chloride: 95 mmol/L — ABNORMAL LOW (ref 98–111)
Creatinine, Ser: 2.07 mg/dL — ABNORMAL HIGH (ref 0.61–1.24)
GFR calc Af Amer: 39 mL/min — ABNORMAL LOW (ref >60)
GFR calc non Af Amer: 34 mL/min — ABNORMAL LOW (ref >60)
GLUCOSE: 120 mg/dL — AB (ref 70–99)
Potassium: 3.9 mmol/L (ref 3.5–5.1)
Sodium: 136 mmol/L (ref 135–145)
Total Bilirubin: 1.8 mg/dL — ABNORMAL HIGH (ref 0.3–1.2)
Total Protein: 6.6 g/dL (ref 6.5–8.1)

## 2018-04-04 LAB — CBC WITH DIFFERENTIAL/PLATELET
Abs Immature Granulocytes: 0.03 10*3/uL (ref 0.00–0.07)
Basophils Absolute: 0 10*3/uL (ref 0.0–0.1)
Basophils Relative: 0 %
Eosinophils Absolute: 0 10*3/uL (ref 0.0–0.5)
Eosinophils Relative: 0 %
HCT: 39.9 % (ref 39.0–52.0)
HEMOGLOBIN: 13 g/dL (ref 13.0–17.0)
Immature Granulocytes: 0 %
Lymphocytes Relative: 6 %
Lymphs Abs: 0.6 10*3/uL — ABNORMAL LOW (ref 0.7–4.0)
MCH: 31.2 pg (ref 26.0–34.0)
MCHC: 32.6 g/dL (ref 30.0–36.0)
MCV: 95.7 fL (ref 80.0–100.0)
MONO ABS: 1.1 10*3/uL — AB (ref 0.1–1.0)
MONOS PCT: 11 %
Neutro Abs: 7.9 10*3/uL — ABNORMAL HIGH (ref 1.7–7.7)
Neutrophils Relative %: 83 %
Platelets: 236 10*3/uL (ref 150–400)
RBC: 4.17 MIL/uL — ABNORMAL LOW (ref 4.22–5.81)
RDW: 13.4 % (ref 11.5–15.5)
WBC: 9.6 10*3/uL (ref 4.0–10.5)
nRBC: 0 % (ref 0.0–0.2)

## 2018-04-04 LAB — SALICYLATE LEVEL: Salicylate Lvl: <7 mg/dL (ref 2.8–30.0)

## 2018-04-04 LAB — ACETAMINOPHEN LEVEL: Acetaminophen (Tylenol), Serum: <10 ug/mL — ABNORMAL LOW (ref 10–30)

## 2018-04-04 LAB — ETHANOL: Alcohol, Ethyl (B): <10 mg/dL (ref <10)

## 2018-04-04 MED ORDER — LIDOCAINE-EPINEPHRINE (PF) 2 %-1:200000 IJ SOLN
10.0000 mL | Freq: Once | INTRAMUSCULAR | Status: AC
  Administered 2018-04-04: 10 mL
  Filled 2018-04-04: qty 20

## 2018-04-04 NOTE — ED Notes (Addendum)
Pt answering orientation questions correctly, except for self. Pt states that he is "[name]" and that he "has no birthdate." During assessment by RN & PA, pt repeatedly asked staff "Where do you believe you're going when you die?"

## 2018-04-04 NOTE — BH Assessment (Signed)
Clock has been stopped for [facility] Assessment due to two assessments ordered on two patients at same hospital, both of which are in the hallway instead of in rooms. After the assessment is complete on the first pt, clinician will contact this pt's nurse to complete this assessment.
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
## 2018-04-04 NOTE — ED Notes (Signed)
PA at bedside, suturing wound.
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
## 2018-04-05 ENCOUNTER — Emergency Department (HOSPITAL_COMMUNITY): Payer: Medicaid Other

## 2018-04-05 MED ORDER — SODIUM CHLORIDE 0.9 % IV BOLUS
1000.0000 mL | Freq: Once | INTRAVENOUS | Status: AC
  Administered 2018-04-05: 1000 mL via INTRAVENOUS

## 2018-04-05 NOTE — [facility] Counselor (Addendum)
Pt was psychiatrically cleared by Patriciaann Clan, PA however Claiborne Billings, [place] recommends pt to be reassessed by psychiatry in the morning.   Vertell Novak, [place], [facility], [facility] Triage Specialist [phone]

## 2018-04-05 NOTE — ED Notes (Signed)
Per [facility] counselor, pt is psych cleared.

## 2018-04-05 NOTE — ED Notes (Signed)
Pt refusing to change into paper scrubs per facility policy. Pt verbally insulting to staff and refusing to cooperate with any requests. MD made aware.

## 2018-04-05 NOTE — ED Notes (Signed)
Pt's IVC papers rescinded by Kathrynn Humble, MD. Pt continues to present as delusional, but denies any SI/HI. Pt continues to be insulting and non-compliant with staff. Pt given bus pass, but refused AVS papers or signature for discharge. Pt escorted by Security to front lobby and departed in NAD.
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
## 2018-04-05 NOTE — ED Notes (Signed)
Pt continues to refer to himself as [name]". Pt seems upset at the contents of his sandwich bag. "How can you call yourself a Christian and treat Christ this way?"

## 2018-04-05 NOTE — ED Notes (Signed)
Pt was taken to [facility] for tele psych.

## 2018-04-05 NOTE — Discharge Instructions (Addendum)
Your stitches will need to be removed in 1 week. Go to behavioral health Hospital if you wanted further psychiatric help.
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
## 2018-04-07 ENCOUNTER — Emergency Department (HOSPITAL_COMMUNITY)
Admission: EM | Admit: 2018-04-07 | Discharge: 2018-04-09 | Disposition: A | Discharge status: Home / Self Care | Payer: Medicaid Other | Attending: Emergency Medicine | Admitting: Emergency Medicine

## 2018-04-07 ENCOUNTER — Encounter (HOSPITAL_COMMUNITY): Payer: Self-pay | Admitting: Emergency Medicine

## 2018-04-07 DIAGNOSIS — Z85828 Personal history of other malignant neoplasm of skin: Secondary | ICD-10-CM | POA: Insufficient documentation

## 2018-04-07 DIAGNOSIS — F29 Unspecified psychosis not due to a substance or known physiological condition: Secondary | ICD-10-CM | POA: Insufficient documentation

## 2018-04-07 DIAGNOSIS — F17210 Nicotine dependence, cigarettes, uncomplicated: Secondary | ICD-10-CM | POA: Insufficient documentation

## 2018-04-07 DIAGNOSIS — F319 Bipolar disorder, unspecified: Secondary | ICD-10-CM | POA: Insufficient documentation

## 2018-04-07 DIAGNOSIS — F312 Bipolar disorder, current episode manic severe with psychotic features: Secondary | ICD-10-CM | POA: Diagnosis present

## 2018-04-07 DIAGNOSIS — L304 Erythema intertrigo: Secondary | ICD-10-CM | POA: Insufficient documentation

## 2018-04-07 LAB — CBC WITH DIFFERENTIAL/PLATELET
Abs Immature Granulocytes: 0.05 10*3/uL (ref 0.00–0.07)
BASOS PCT: 0 %
Basophils Absolute: 0 10*3/uL (ref 0.0–0.1)
EOS ABS: 0 10*3/uL (ref 0.0–0.5)
Eosinophils Relative: 0 %
HCT: 41 % (ref 39.0–52.0)
Hemoglobin: 13.6 g/dL (ref 13.0–17.0)
Immature Granulocytes: 0 %
Lymphocytes Relative: 12 %
Lymphs Abs: 1.4 10*3/uL (ref 0.7–4.0)
MCH: 31.9 pg (ref 26.0–34.0)
MCHC: 33.2 g/dL (ref 30.0–36.0)
MCV: 96.2 fL (ref 80.0–100.0)
Monocytes Absolute: 1.3 10*3/uL — ABNORMAL HIGH (ref 0.1–1.0)
Monocytes Relative: 12 %
Neutro Abs: 8.5 10*3/uL — ABNORMAL HIGH (ref 1.7–7.7)
Neutrophils Relative %: 76 %
PLATELETS: 276 10*3/uL (ref 150–400)
RBC: 4.26 MIL/uL (ref 4.22–5.81)
RDW: 13.4 % (ref 11.5–15.5)
WBC: 11.3 10*3/uL — ABNORMAL HIGH (ref 4.0–10.5)
nRBC: 0 % (ref 0.0–0.2)

## 2018-04-07 LAB — SALICYLATE LEVEL: Salicylate Lvl: <7 mg/dL (ref 2.8–30.0)

## 2018-04-07 LAB — COMPREHENSIVE METABOLIC PANEL
ALT: 34 U/L (ref 0–44)
AST: 41 U/L (ref 15–41)
Albumin: 4.4 g/dL (ref 3.5–5.0)
Alkaline Phosphatase: 59 U/L (ref 38–126)
Anion gap: 14 (ref 5–15)
BUN: 23 mg/dL — ABNORMAL HIGH (ref 6–20)
CO2: 23 mmol/L (ref 22–32)
Calcium: 9.5 mg/dL (ref 8.9–10.3)
Chloride: 97 mmol/L — ABNORMAL LOW (ref 98–111)
Creatinine, Ser: 1.26 mg/dL — ABNORMAL HIGH (ref 0.61–1.24)
Glucose, Bld: 126 mg/dL — ABNORMAL HIGH (ref 70–99)
Potassium: 2.9 mmol/L — ABNORMAL LOW (ref 3.5–5.1)
Sodium: 134 mmol/L — ABNORMAL LOW (ref 135–145)
Total Bilirubin: 1.6 mg/dL — ABNORMAL HIGH (ref 0.3–1.2)
Total Protein: 6.9 g/dL (ref 6.5–8.1)

## 2018-04-07 LAB — CBG MONITORING, ED: Glucose-Capillary: 130 mg/dL — ABNORMAL HIGH (ref 70–99)

## 2018-04-07 LAB — ETHANOL: Alcohol, Ethyl (B): <10 mg/dL (ref <10)

## 2018-04-07 LAB — ACETAMINOPHEN LEVEL

## 2018-04-07 MED ORDER — POTASSIUM CHLORIDE CRYS ER 20 MEQ PO TBCR
40.0000 meq | EXTENDED_RELEASE_TABLET | Freq: Once | ORAL | Status: AC
  Administered 2018-04-07: 40 meq via ORAL
  Filled 2018-04-07: qty 2

## 2018-04-07 MED ORDER — ZIPRASIDONE MESYLATE 20 MG IM SOLR
10.0000 mg | Freq: Once | INTRAMUSCULAR | Status: AC
  Administered 2018-04-07: 10 mg via INTRAMUSCULAR
  Filled 2018-04-07: qty 20

## 2018-04-07 MED ORDER — LORAZEPAM 2 MG/ML IJ SOLN
1.0000 mg | Freq: Once | INTRAMUSCULAR | Status: AC
  Administered 2018-04-07: 1 mg via INTRAMUSCULAR
  Filled 2018-04-07: qty 1

## 2018-04-07 MED ORDER — STERILE WATER FOR INJECTION IJ SOLN
INTRAMUSCULAR | Status: AC
  Administered 2018-04-07: 10 mL
  Filled 2018-04-07: qty 10

## 2018-04-07 NOTE — BHH Counselor (Signed)
Pt denies having family, friend supports clinician can contact to obtain collateral information.   Vertell Novak, [place], [facility], [facility] Triage Specialist [phone]
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
## 2018-04-07 NOTE — ED Notes (Signed)
Bed: HWT88 Expected date: 04/07/18 Expected time: 9:39 PM Means of arrival: Ambulance Comments:
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
## 2018-04-07 NOTE — ED Notes (Signed)
Pt given geodon 10 mg and ativan 1mg  in thigh. Pt yelling and refused to cooperate. Pt currently walking the halls. Pt refused to answer any questions at this time. Will continue to monitor.

## 2018-04-07 NOTE — ED Triage Notes (Signed)
Pt brought in by EMS, psychotic, bizarre behavior, found by [employer] sitting in the middle of the road on [address].  Not answering questions, only responding to [place].
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
## 2018-04-07 NOTE — ED Notes (Signed)
Pt noted with a laceration to left eye. Pt noted with generalized bruising and scratches.

## 2018-04-08 DIAGNOSIS — F312 Bipolar disorder, current episode manic severe with psychotic features: Secondary | ICD-10-CM | POA: Diagnosis present

## 2018-04-08 LAB — RAPID URINE DRUG SCREEN, HOSP PERFORMED
Amphetamines: NOT DETECTED
Barbiturates: NOT DETECTED
Benzodiazepines: POSITIVE — AB
Cocaine: NOT DETECTED
OPIATES: NOT DETECTED
Tetrahydrocannabinol: POSITIVE — AB

## 2018-04-08 MED ORDER — HALOPERIDOL 2 MG PO TABS
2.0000 mg | ORAL_TABLET | Freq: Two times a day (BID) | ORAL | Status: DC
  Filled 2018-04-08: qty 1

## 2018-04-08 MED ORDER — DIPHENHYDRAMINE HCL 50 MG/ML IJ SOLN
50.0000 mg | Freq: Once | INTRAMUSCULAR | Status: AC | PRN
  Administered 2018-04-08: 50 mg via INTRAMUSCULAR
  Filled 2018-04-08: qty 1

## 2018-04-08 MED ORDER — LORAZEPAM 2 MG/ML IJ SOLN: 2.0000 mg | Freq: Once | INTRAMUSCULAR | Status: DC | PRN

## 2018-04-08 MED ORDER — LORAZEPAM 2 MG/ML IJ SOLN
2.0000 mg | Freq: Once | INTRAMUSCULAR | Status: AC | PRN
  Administered 2018-04-08: 2 mg via INTRAMUSCULAR
  Filled 2018-04-08: qty 1

## 2018-04-08 MED ORDER — GABAPENTIN 300 MG PO CAPS
300.0000 mg | ORAL_CAPSULE | Freq: Two times a day (BID) | ORAL | Status: DC
  Administered 2018-04-08 – 2018-04-09 (×3): 300 mg via ORAL
  Filled 2018-04-08 (×3): qty 1

## 2018-04-08 MED ORDER — ZIPRASIDONE MESYLATE 20 MG IM SOLR
20.0000 mg | Freq: Once | INTRAMUSCULAR | Status: AC | PRN
  Administered 2018-04-08: 20 mg via INTRAMUSCULAR
  Filled 2018-04-08: qty 20

## 2018-04-08 MED ORDER — ZIPRASIDONE MESYLATE 20 MG IM SOLR: 20.0000 mg | Freq: Once | INTRAMUSCULAR | Status: DC | PRN

## 2018-04-08 MED ORDER — DIPHENHYDRAMINE HCL 50 MG/ML IJ SOLN: 50.0000 mg | Freq: Once | INTRAMUSCULAR | Status: DC | PRN

## 2018-04-08 MED ORDER — RISPERIDONE 1 MG PO TABS
1.0000 mg | ORAL_TABLET | Freq: Two times a day (BID) | ORAL | Status: DC
  Administered 2018-04-08 – 2018-04-09 (×2): 1 mg via ORAL
  Filled 2018-04-08 (×3): qty 1

## 2018-04-08 MED ORDER — STERILE WATER FOR INJECTION IJ SOLN
INTRAMUSCULAR | Status: AC
  Administered 2018-04-08: 10 mL
  Filled 2018-04-08: qty 10

## 2018-04-08 NOTE — Progress Notes (Signed)
Received Andrew Chaney this PM at the change of shift, he is restless disorganized and constantly making requests at the nurses station. He was assessed talking out loud to his room. He was medicated per order and remained restless for approximently 2 hours.He eventually drifted off to sleep at 12 midnight. He slept throughout the night, got up this morning with many requests including a couple of pitches of water.

## 2018-04-08 NOTE — ED Notes (Signed)
Pt has been agitated and repetitious demanding to have his clothing. Have behavior like a temper tantrum, stomping and grimacing tightening his jaw, pacing. Banged on the window. Pt has been manic all morning, pacing, sometimes going into rooms that are not his room. Medication given to help pt calm down.
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
## 2018-04-08 NOTE — ED Notes (Signed)
Pt refuses to give a urine specimen.

## 2018-04-09 ENCOUNTER — Encounter (HOSPITAL_COMMUNITY): Payer: Self-pay | Admitting: Emergency Medicine

## 2018-04-09 ENCOUNTER — Other Ambulatory Visit: Payer: Self-pay

## 2018-04-09 ENCOUNTER — Emergency Department (HOSPITAL_COMMUNITY)
Admission: EM | Admit: 2018-04-09 | Discharge: 2018-04-09 | Disposition: A | Discharge status: Home / Self Care | Payer: Medicaid Other | Attending: Emergency Medicine | Admitting: Emergency Medicine

## 2018-04-09 DIAGNOSIS — R21 Rash and other nonspecific skin eruption: Secondary | ICD-10-CM

## 2018-04-09 DIAGNOSIS — F319 Bipolar disorder, unspecified: Secondary | ICD-10-CM | POA: Insufficient documentation

## 2018-04-09 DIAGNOSIS — L304 Erythema intertrigo: Secondary | ICD-10-CM

## 2018-04-09 DIAGNOSIS — F17210 Nicotine dependence, cigarettes, uncomplicated: Secondary | ICD-10-CM | POA: Insufficient documentation

## 2018-04-09 DIAGNOSIS — Z8547 Personal history of malignant neoplasm of testis: Secondary | ICD-10-CM | POA: Insufficient documentation

## 2018-04-09 MED ORDER — RISPERIDONE 1 MG PO TABS: 1.0000 mg | ORAL_TABLET | Freq: Two times a day (BID) | ORAL | 0 refills | Status: DC

## 2018-04-09 MED ORDER — GABAPENTIN 300 MG PO CAPS: 300.0000 mg | ORAL_CAPSULE | Freq: Two times a day (BID) | ORAL | 0 refills | Status: DC

## 2018-04-09 MED ORDER — CLOTRIMAZOLE 1 % EX CREA: TOPICAL_CREAM | CUTANEOUS | 0 refills | Status: DC

## 2018-04-09 NOTE — Discharge Instructions (Signed)
Your exam today was consistent with a fungal infection to your groin causing your discomfort and redness.  As you are evaluated by the previous team, you left before a prescription was provided.  Please use the cream twice a day to treat for your infection.  It will likely clear up.  Please keep it dry.  Please follow-up with your primary doctor for further evaluation of this.  As you denied any other complaint such as fevers, chills, ingestion, cough, chest pain, shortness breath, nausea, vomiting, urinary symptoms or GI symptoms, we feel you are safe for discharge home as you were just cleared both medically and psychiatrically by respective teams several hours ago.  As you had no other complaints, we feel you are safe for discharge home.  Please follow-up as directed.

## 2018-04-09 NOTE — Consult Note (Addendum)
[facility] Psych ED Discharge  04/09/2018 11:04 AM Andrew Chaney  MRN:  [id number] Principal Problem: Bipolar I disorder, current or most recent episode manic, with psychotic features [facility]) Discharge Diagnoses: Principal Problem:   Bipolar I disorder, current or most recent episode manic, with psychotic features ([place])   Subjective: Pt was seen and chart reviewed with treatment team and Dr Darleene Cleaver. Pt denies suicidal/homicidal ideation, denies auditory/visual hallucinations and does not appear to be responding to internal stimuli. Pt remained in the emergency room overnight. He is intrusive and demanding of staff. He refuses to speak to anyone unless they address him as "Jesus." Pt is disorganized. According to extensive chart review this appears to be Pt's baseline behavior. Pt was picked up on the street for bizarre behavior. He is known to this ED system. Pt often refuses to take medication but he has received medications in the ED. Pt will be given outpatient and shelter resources upon discharge. Pt will also receive prescriptions for the mental health medications he received while in the ED.  Pt is psychiatrically clear.   Total Time spent with patient: 30 minutes  Past Psychiatric History: As above  Past Medical History:  Past Medical History:  Diagnosis Date  . Bipolar 1 disorder ([place])   . 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 Renal disorder    History reviewed. No pertinent surgical history. Family History: History reviewed. No pertinent family history. Family Psychiatric  History: Pt did not give this information Social History:  Social History   Substance and Sexual Activity  Alcohol Use Yes     Social History   Substance and Sexual Activity  Drug Use No  . Types: Marijuana   Comment: States no longer uses marijuana because he is "on probation"    Social History   Socioeconomic History  . Marital status: Single    Spouse name: Not on file  . Number of children: Not  on file  . Years of education: Not on file  . Highest education level: Not on file  Occupational History  . Not on file  Social Needs  . Financial resource strain: Not on file  . Food insecurity:    Worry: Not on file    Inability: Not on file  . Transportation needs:    Medical: Not on file    Non-medical: Not on file  Tobacco Use  . Smoking status: Current Every Day Smoker    Types: Cigarettes  . Smokeless tobacco: Never Used  Substance and Sexual Activity  . Alcohol use: Yes  . Drug use: No    Types: Marijuana    Comment: States no longer uses marijuana because he is "on probation"  . Sexual activity: Not Currently  Lifestyle  . Physical activity:    Days per week: Not on file    Minutes per session: Not on file  . Stress: Not on file  Relationships  . Social connections:    Talks on phone: Not on file    Gets together: Not on file    Attends religious service: Not on file    Active member of club or organization: Not on file    Attends meetings of clubs or organizations: Not on file    Relationship status: Not on file  Other Topics Concern  . Not on file  Social History Narrative  . Not on file    Has this patient used any form of tobacco in the last 30 days? (Cigarettes, Smokeless  Tobacco, Cigars, and/or Pipes) Prescription not provided because: Pt does not use tobacco products  Current Medications: Current Facility-Administered Medications  Medication Dose Route Frequency Provider Last Rate Last Dose  . diphenhydrAMINE (BENADRYL) injection 50 mg  50 mg Intramuscular Once PRN Ethelene Hal, NP      . gabapentin (NEURONTIN) capsule 300 mg  300 mg Oral BID Corena Pilgrim, MD   300 mg at 04/08/18 2125  . LORazepam (ATIVAN) injection 2 mg  2 mg Intramuscular Once PRN Ethelene Hal, NP      . risperiDONE (RISPERDAL) tablet 1 mg  1 mg Oral BID Tamaria Dunleavy, MD   1 mg at 04/08/18 2125  . ziprasidone (GEODON) injection 20 mg  20 mg Intramuscular  Once PRN Ethelene Hal, NP       Current Outpatient Medications  Medication Sig Dispense Refill  . diazepam (VALIUM) 5 MG tablet Take 1 tablet (5 mg total) by mouth at bedtime as needed and may repeat dose one time if needed for anxiety. (Patient not taking: Reported on 12/31/2014) 10 tablet 0  . diazepam (VALIUM) 5 MG tablet Take 1 tablet (5 mg total) by mouth at bedtime as needed (insomnia). (Patient not taking: Reported on 12/31/2014) 10 tablet 0  . methocarbamol (ROBAXIN) 500 MG tablet Take 1 tablet (500 mg total) by mouth 2 (two) times daily as needed for muscle spasms. (Patient not taking: Reported on 12/31/2014) 20 tablet 0     Musculoskeletal: Strength & Muscle Tone: within normal limits Gait & Station: normal Patient leans: N/A  Psychiatric Specialty Exam: Physical Exam  Constitutional: He is oriented to person, place, and time. He appears well-developed and well-nourished.  HENT:  Head: Normocephalic.  Respiratory: Effort normal.  Musculoskeletal: Normal range of motion.  Neurological: He is alert and oriented to person, place, and time.  Psychiatric: He has a normal mood and affect. His speech is normal and behavior is normal. Judgment and thought content normal. Cognition and memory are normal.    Review of Systems  All other systems reviewed and are negative.   Blood pressure 110/76, pulse 79, temperature 98.1 F (36.7 C), temperature source Oral, resp. rate 18, SpO2 96 %.There is no height or weight on file to calculate BMI.  General Appearance: Casual  Eye Contact:  Fair  Speech:  Clear and Coherent  Volume:  Normal  Mood:  Irritable  Affect:  Congruent  Thought Process:  Disorganized and Descriptions of Associations: Loose  Orientation:  Full (Time, Place, and Person)  Thought Content:  Logical  Suicidal Thoughts:  No  Homicidal Thoughts:  No  Memory:  Immediate;   Good Recent;   Fair Remote;   Fair  Judgement:  Impaired  Insight:  Shallow   Psychomotor Activity:  Normal  Concentration:  Concentration: Fair and Attention Span: Fair  Recall:  [employer] of Knowledge:  Fair  Language:  Good  Akathisia:  No  Handed:  Right  AIMS (if indicated):     Assets:  Communication Skills Social Support  ADL's:  Intact  Cognition:  WNL  Sleep:        Demographic Factors:  Male and Caucasian  Loss Factors: Financial problems/change in socioeconomic status  Historical Factors: Family history of mental illness or substance abuse  Risk Reduction Factors:   Sense of responsibility to family  Continued Clinical Symptoms:  Bipolar Disorder:   Mixed State  Cognitive Features That Contribute To Risk:  Closed-mindedness    Suicide Risk:  Minimal: No identifiable suicidal ideation.  Patients presenting with no risk factors but with morbid ruminations; may be classified as minimal risk based on the severity of the depressive symptoms    Plan Of Care/Follow-up recommendations:  Activity:  as tolerated Diet:  Heart Healthy  Disposition and Treatment Plan: Bipolar I disorder, current or most recent episode manic, with psychotic features ([place]) Take all medications as prescribed. You received prescriptions upon discharge. Please go to your pharmacy or [facility] to have them filled. Keep all follow-up appointments as scheduled.  Do not consume alcohol or use illegal drugs while on prescription medications. Report any adverse effects from your medications to your primary care provider promptly.  In the event of recurrent symptoms or worsening symptoms, call 911, a crisis hotline, or go to the nearest emergency department for evaluation.   Ethelene Hal, NP 04/09/2018, 11:04 AM  Patient seen face-to-face for psychiatric evaluation, chart reviewed and case discussed with the physician extender and developed treatment plan. Reviewed the information documented and agree with the treatment plan. Corena Pilgrim, MD

## 2018-04-09 NOTE — Progress Notes (Signed)
CSW at the direction of the psychiatric treatment team provided pt with the contact information for the [facility] which is temporarily housing homeless people and [place] for information on seeking appointments for medication management and/or therapy.  Pt was appreciative and thanked the CSW.  CSW will continue to follow for D/C needs.  Alphonse Guild. Maciej Schweitzer, LCSW, LCAS, CSI Transitions of Care Clinical Social Worker Care Coordination Department Ph: [phone]

## 2018-04-09 NOTE — ED Provider Notes (Signed)
1215: I was approached by RN as patient was being walked out to be discharged from [facility].  As he was changing apparently patient reported he had a rash in his groin and he was going to check back into the ED for evaluation of this.  I evaluated patient while in the bathroom with police officer the side.  Reports nontender, non-itchy, red rash to bilateral inguinal creases.  He is a difficult historian, avoids eye contact and will not tell me for how long.  No interventions.  No fevers.  No urinary symptoms.  States the pain bothers him when he walks. Physical Exam  BP 110/76 (BP Location: Right Arm)   Pulse 79   Temp 98.1 F (36.7 C) (Oral)   Resp 18   SpO2 96%   Physical Exam Constitutional:      Appearance: He is well-developed. He is not toxic-appearing.  HENT:     Head: Normocephalic.     Right Ear: External ear normal.     Left Ear: External ear normal.     Nose: Nose normal.  Eyes:     Conjunctiva/sclera: Conjunctivae normal.  Neck:     Musculoskeletal: Full passive range of motion without pain.  Cardiovascular:     Rate and Rhythm: Normal rate.  Pulmonary:     Effort: Pulmonary effort is normal. No tachypnea or respiratory distress.  Musculoskeletal: Normal range of motion.  Skin:    General: Skin is warm and dry.     Capillary Refill: Capillary refill takes less than 2 seconds.     Comments: Moist, glossy, nontender erythematous patch to bilateral inguinal creases with some small fissures at the creases.  Nontender.  No warmth.  No ulcerations or wounds.  Neurological:     Mental Status: He is alert and oriented to person, place, and time.  Psychiatric:     Comments: Poor eye contact.  Difficult historian.     ED Course/Procedures     Procedures  MDM  1218: Rash most consistent with intertrigo in the groin.  No signs of significant suprimposed infection, Fournier's. I explained this will require yeast medicine.  Given his likelihood for noncompliance I also offered  antibiotics given the location.  For some reason, patient became upset when I told him he had a yeast infection.  Stated "you are not even a doctor".  In the presence of RN and police officer I tried to explain benign process of intertrigo and importance of keeping it dry.  Patient at this time stopped answering and talking to me.  I asked if he would like prescriptions printed to him but again did not answer me.  Patient was escorted out by Engineer, structural.        Kinnie Feil, PA-C 04/09/18 1219    Malvin Johns, MD 04/09/18 1225

## 2018-04-09 NOTE — ED Provider Notes (Signed)
[place] EMERGENCY DEPARTMENT Provider Note   CSN: [id number] Arrival date & time: 04/09/18  1558    History   Chief Complaint Chief Complaint  Patient presents with  . Rash    HPI Andrew Chaney is a 63 y.o. male.     The history is provided by the patient and medical records. The history is limited by the condition of the patient. No language interpreter was used.  Rash  Location:  Pelvis Pelvic rash location:  Groin Quality: itchiness, painful and redness   Severity:  Moderate Onset quality:  Gradual Timing:  Unable to specify Progression:  Unable to specify Chronicity:  New Relieved by:  Nothing Worsened by:  Nothing Ineffective treatments:  None tried Associated symptoms: no abdominal pain, no diarrhea, no fatigue, no fever, no headaches, no nausea, no shortness of breath, no URI, not vomiting and not wheezing     Past Medical History:  Diagnosis Date  . Bipolar 1 disorder ([place])   . 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 Renal disorder     Patient Active Problem List   Diagnosis Date Noted  . Bipolar I disorder, current or most recent episode manic, with psychotic features ([place]) 04/08/2018  . Confusion 01/03/2015  . Chronic hepatitis C ([place]) 12/20/2012  . Renal failure 12/17/2012  . Acute encephalopathy 12/17/2012  . ARF (acute renal failure) ([place]) 12/17/2012  . Hyponatremia 12/17/2012  . CAP (community acquired pneumonia) 12/17/2012  . Rhabdomyolysis 12/17/2012  . 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      Home Medications    Prior to Admission medications   Medication Sig Start Date End Date Taking? Authorizing Provider  diazepam (VALIUM) 5 MG tablet Take 1 tablet (5 mg total) by mouth at bedtime as needed and may repeat dose one time if needed for anxiety. Patient not taking: Reported on 12/31/2014 03/13/13   Junius Creamer, NP  diazepam (VALIUM) 5 MG tablet Take 1  tablet (5 mg total) by mouth at bedtime as needed (insomnia). Patient not taking: Reported on 12/31/2014 07/13/13   Clayton Bibles, PA-C  gabapentin (NEURONTIN) 300 MG capsule Take 1 capsule (300 mg total) by mouth 2 (two) times daily. 04/09/18   Ethelene Hal, NP  methocarbamol (ROBAXIN) 500 MG tablet Take 1 tablet (500 mg total) by mouth 2 (two) times daily as needed for muscle spasms. Patient not taking: Reported on 12/31/2014 05/13/13   Pisciotta, Elmyra Ricks, PA-C  risperiDONE (RISPERDAL) 1 MG tablet Take 1 tablet (1 mg total) by mouth 2 (two) times daily. 04/09/18   Ethelene Hal, NP    Family History No family history on file.  Social History Social History   Tobacco Use  . Smoking status: Current Every Day Smoker    Types: Cigarettes  . Smokeless tobacco: Never Used  Substance Use Topics  . Alcohol use: Yes  . Drug use: No    Types: Marijuana    Comment: States no longer uses marijuana because he is "on probation"     Allergies   Patient has no known allergies.   Review of Systems Review of Systems  Constitutional: Negative for chills, diaphoresis, fatigue and fever.  HENT: Negative for congestion.   Respiratory: Negative for chest tightness, shortness of breath and wheezing.   Cardiovascular: Negative for chest pain.  Gastrointestinal: Negative for abdominal pain, constipation, diarrhea, nausea and vomiting.  Genitourinary: Negative for decreased urine volume, dysuria  and flank pain.  Musculoskeletal: Negative for back pain, neck pain and neck stiffness.  Skin: Positive for rash.  Neurological: Negative for headaches.  Psychiatric/Behavioral: Negative for agitation and confusion.  All other systems reviewed and are negative.    Physical Exam Updated Vital Signs Ht 5\' 7"  (1.702 m)   Wt 75 kg   BMI 25.90 kg/m   Physical Exam Vitals signs and nursing note reviewed.  Constitutional:      Appearance: He is well-developed.  HENT:     Head: Normocephalic  and atraumatic.  Eyes:     Conjunctiva/sclera: Conjunctivae normal.  Neck:     Musculoskeletal: Neck supple.  Cardiovascular:     Rate and Rhythm: Normal rate and regular rhythm.     Heart sounds: No murmur.  Pulmonary:     Effort: Pulmonary effort is normal. No respiratory distress.     Breath sounds: Normal breath sounds. No decreased breath sounds, wheezing, rhonchi or rales.  Abdominal:     Palpations: Abdomen is soft.     Tenderness: There is no abdominal tenderness.  Genitourinary:    Pubic Area: Rash present.       Comments: Patient had red itching and erythematous area and inguinal groin crease.  No drainage seen.  No evidence of Fournier's or abscess on visual examination.  Patient denied difficulty with urination.   Rash appears fungal in appearance. Skin:    General: Skin is warm and dry.     Findings: Rash present.  Neurological:     General: No focal deficit present.     Mental Status: He is alert.     Motor: No weakness.     Gait: Gait normal.  Psychiatric:        Mood and Affect: Mood is not anxious. Affect is blunt.        Behavior: Behavior is not agitated or aggressive.        Thought Content: Thought content does not include homicidal or suicidal ideation. Thought content does not include homicidal or suicidal plan.      ED Treatments / Results  Labs (all labs ordered are listed, but only abnormal results are displayed) Labs Reviewed - No data to display  EKG None  Radiology No results found.  Procedures Procedures (including critical care time)  Medications Ordered in ED Medications - No data to display   Initial Impression / Assessment and Plan / ED Course  I have reviewed the triage vital signs and the nursing notes.  Pertinent labs & imaging results that were available during my care of the patient were reviewed by me and considered in my medical decision making (see chart for details).        Andrew Chaney is a 63 y.o. male with  a past medical history significant bipolar disorder, hepatitis C, and recurrent psychotic disorder with delusions who was recently discharged several hours ago who presents with groin rash.  Chart view shows that patient was discharged several hours ago after a several day stay in the emergency department at [facility] long.  At that time, patient was both medically and psychiatrically cleared today.  On arrival here, patient does not want to answer any questions but will shake his head yes or no occasionally.  Chart review shows that as patient was being discharged earlier today, he reported that he had a groin rash and was evaluated by previous team.  Patient was found to have intertrigo and left angrily before he could be given prescription for  antifungal cream.  Patient reported that he would go to another emergency department which he has now done so.  Patient shook his head no when asked about suicidal ideation or homicidal ideation.  Patient pulled down his underwear and pointed to his groin when asked about what was bothering him.  On my exam, patient does have evidence of intertrigo with redness in his inguinal folds near his genitalia.  Patient would not allow further examination.  Patient did have clear lungs when I tried to listen to his lungs.  Otherwise patient was having a normal gait and was moving all extremities.  Patient was asked extensively if he had other complaints and denied all other complaints by shaking his head and no.  He specifically denied fevers, chills, chest pain, shortness breath, nausea vomiting, urinary symptoms or GI symptoms.  An extremely extensive review of systems was asked and patient did not report any aside from his rash.  As patient was psychiatrically cleared several hours ago and he denies SI or HI at this time, do not feel he needs further psychiatric work-up currently as he is not complaining of any new symptoms.  From a medical standpoint, I agree with the  previous diagnosis of intertrigo and I will print a prescription for him for clotrimazole.  Patient would not answer any further questions and did not have more complaints.  Thus, do not feel there is more to offer as he has been cleared today.  Chart review also shows that social work team provided information about where he can stay [facility] at this time.  Given his lack of other complaints and will not discuss further problems, patient will be discharged.  Do not feel patient is a 30 himself or others at this time.  Patient discharged.   Final Clinical Impressions(s) / ED Diagnoses   Final diagnoses:  Rash  Intertrigo    ED Discharge Orders         Ordered    clotrimazole (LOTRIMIN) 1 % cream     04/09/18 1645          Clinical Impression: 1. Rash   2. Intertrigo     Disposition: Discharge  Condition: Good  I have discussed the results, Dx and Tx plan with the pt(& family if present). He/she/they expressed understanding and agree(s) with the plan. Discharge instructions discussed at great length. Strict return precautions discussed and pt &/or family have verbalized understanding of the instructions. No further questions at time of discharge.    New Prescriptions   CLOTRIMAZOLE (LOTRIMIN) 1 % CREAM    Apply to affected area 2 times daily    Follow Up: [place] [id number] [phone] Schedule an appointment as soon as possible for a visit    [facility] [address] [id number] mc [place]       Jacci Ruberg, Gwenyth Allegra, MD 04/09/18 [phone]

## 2018-04-09 NOTE — ED Triage Notes (Signed)
Pt presents for a rash to his groin. Looks to be a yeast infection.

## 2018-04-09 NOTE — ED Notes (Signed)
Patient appears very confused.  He is up at nurses station constantly asking for things.  When he got his toiletries to shower he came out asking how to use the lotion.  He then came back out needing body wash although he had a full one.  He is spending an excessive amount of time in the bathroom attempting to shower.   He is irritable and " dissatisfied with his service here"   15 minute checks and video monitoring in place.

## 2018-04-09 NOTE — Discharge Instructions (Signed)
For your your shelter needs, please see the following information:   [place]: *As of 3/26: Now limiting day shelter guests to 10 in total; extending hours from 7 am - 7 pm, including weekends; they will be opening a shelter-in-place shelter for the homeless at the [facility] by Saturday, 3/28, and will call if this is ready sooner.   The [place] bus is running for free.   For your mental health needs, please follow up at:   [facility]  [phone] Closed  Opens tomorrow 8:30 AM

## 2018-04-09 NOTE — ED Notes (Signed)
Pt discharged with resources.  RX and discharge instructions were reviewed.  Patient was uninterested at time.  All belongings were returned.

## 2018-04-09 NOTE — ED Notes (Signed)
Pt was d/c'd from [facility] at 1200 today and was given homeless shelter resources including pt was given address to [employer].

## 2018-04-09 NOTE — ED Notes (Signed)
Pt would not allow vital signs to be performed and would not answer questions.

## 2018-04-09 NOTE — ED Notes (Signed)
Patient verbalizes understanding of discharge instructions. Opportunity for questioning and answers were provided. Pt discharged from ED and escorted out by security and [employer].

## 2018-04-09 NOTE — ED Notes (Signed)
Attempted to go over the discharge instructions with the pt and he would not allow this RN in the room. Explained the discharge instructions through the curtain to the pt and he grabbed the discharge paperwork including the prescription and threw it into the garbage. Hospital security and off-duty [employer] had to escort the pt out of the facility.

## 2018-04-10 ENCOUNTER — Other Ambulatory Visit: Payer: Self-pay

## 2018-04-10 ENCOUNTER — Emergency Department (HOSPITAL_COMMUNITY)
Admission: EM | Admit: 2018-04-10 | Discharge: 2018-04-11 | Disposition: A | Discharge status: Other Acute Inpatient Hospital | Payer: Self-pay | Attending: Emergency Medicine | Admitting: Emergency Medicine

## 2018-04-10 DIAGNOSIS — Z8547 Personal history of malignant neoplasm of testis: Secondary | ICD-10-CM | POA: Insufficient documentation

## 2018-04-10 DIAGNOSIS — F319 Bipolar disorder, unspecified: Secondary | ICD-10-CM | POA: Insufficient documentation

## 2018-04-10 DIAGNOSIS — F29 Unspecified psychosis not due to a substance or known physiological condition: Secondary | ICD-10-CM

## 2018-04-10 DIAGNOSIS — F17210 Nicotine dependence, cigarettes, uncomplicated: Secondary | ICD-10-CM | POA: Insufficient documentation

## 2018-04-10 LAB — CBC WITH DIFFERENTIAL/PLATELET
Abs Immature Granulocytes: 0.03 10*3/uL (ref 0.00–0.07)
Basophils Absolute: 0 10*3/uL (ref 0.0–0.1)
Basophils Relative: 0 %
Eosinophils Absolute: 0 10*3/uL (ref 0.0–0.5)
Eosinophils Relative: 0 %
HCT: 41.7 % (ref 39.0–52.0)
Hemoglobin: 13.4 g/dL (ref 13.0–17.0)
Immature Granulocytes: 0 %
Lymphocytes Relative: 16 %
Lymphs Abs: 1.2 10*3/uL (ref 0.7–4.0)
MCH: 30.9 pg (ref 26.0–34.0)
MCHC: 32.1 g/dL (ref 30.0–36.0)
MCV: 96.1 fL (ref 80.0–100.0)
MONO ABS: 0.7 10*3/uL (ref 0.1–1.0)
Monocytes Relative: 10 %
Neutro Abs: 5.8 10*3/uL (ref 1.7–7.7)
Neutrophils Relative %: 74 %
Platelets: 261 10*3/uL (ref 150–400)
RBC: 4.34 MIL/uL (ref 4.22–5.81)
RDW: 13.2 % (ref 11.5–15.5)
WBC: 7.8 10*3/uL (ref 4.0–10.5)
nRBC: 0 % (ref 0.0–0.2)

## 2018-04-10 LAB — COMPREHENSIVE METABOLIC PANEL
ALT: 33 U/L (ref 0–44)
AST: 43 U/L — ABNORMAL HIGH (ref 15–41)
Albumin: 4.3 g/dL (ref 3.5–5.0)
Alkaline Phosphatase: 63 U/L (ref 38–126)
Anion gap: 13 (ref 5–15)
BUN: 26 mg/dL — ABNORMAL HIGH (ref 6–20)
CO2: 22 mmol/L (ref 22–32)
Calcium: 10.4 mg/dL — ABNORMAL HIGH (ref 8.9–10.3)
Chloride: 99 mmol/L (ref 98–111)
Creatinine, Ser: 1.3 mg/dL — ABNORMAL HIGH (ref 0.61–1.24)
GFR calc Af Amer: >60 mL/min (ref >60)
GFR calc non Af Amer: 59 mL/min — ABNORMAL LOW (ref >60)
Glucose, Bld: 103 mg/dL — ABNORMAL HIGH (ref 70–99)
Potassium: 4.4 mmol/L (ref 3.5–5.1)
Sodium: 134 mmol/L — ABNORMAL LOW (ref 135–145)
Total Bilirubin: 1.6 mg/dL — ABNORMAL HIGH (ref 0.3–1.2)
Total Protein: 7 g/dL (ref 6.5–8.1)

## 2018-04-10 LAB — ETHANOL: Alcohol, Ethyl (B): <10 mg/dL (ref <10)

## 2018-04-10 MED ORDER — LORAZEPAM 2 MG/ML IJ SOLN: 0.0000 mg | Freq: Two times a day (BID) | INTRAMUSCULAR | Status: DC

## 2018-04-10 MED ORDER — LORAZEPAM 1 MG PO TABS: 0.0000 mg | ORAL_TABLET | Freq: Two times a day (BID) | ORAL | Status: DC

## 2018-04-10 MED ORDER — LORAZEPAM 1 MG PO TABS
0.0000 mg | ORAL_TABLET | Freq: Four times a day (QID) | ORAL | Status: DC
  Administered 2018-04-11: 1 mg via ORAL
  Filled 2018-04-10: qty 1

## 2018-04-10 MED ORDER — LORAZEPAM 2 MG/ML IJ SOLN
2.0000 mg | Freq: Once | INTRAMUSCULAR | Status: AC
  Administered 2018-04-10: 2 mg via INTRAMUSCULAR
  Filled 2018-04-10 (×2): qty 1

## 2018-04-10 MED ORDER — VITAMIN B-1 100 MG PO TABS
100.0000 mg | ORAL_TABLET | Freq: Every day | ORAL | Status: DC
  Administered 2018-04-11: 100 mg via ORAL
  Filled 2018-04-10: qty 1

## 2018-04-10 MED ORDER — LORAZEPAM 2 MG/ML IJ SOLN: 0.0000 mg | Freq: Four times a day (QID) | INTRAMUSCULAR | Status: DC

## 2018-04-10 MED ORDER — STERILE WATER FOR INJECTION IJ SOLN
INTRAMUSCULAR | Status: AC
  Filled 2018-04-10: qty 10

## 2018-04-10 MED ORDER — ZIPRASIDONE MESYLATE 20 MG IM SOLR
10.0000 mg | Freq: Once | INTRAMUSCULAR | Status: AC
  Administered 2018-04-10: 10 mg via INTRAMUSCULAR
  Filled 2018-04-10: qty 20

## 2018-04-10 MED ORDER — THIAMINE HCL 100 MG/ML IJ SOLN: 100.0000 mg | Freq: Every day | INTRAMUSCULAR | Status: DC

## 2018-04-10 NOTE — ED Notes (Signed)
Pt in purple scrubs, wanded, 1 bag of belongings (tennis shoes) taken into custody and placed in locker ______. Pt threw away clothes that were soiled in feces in room trashcan.

## 2018-04-10 NOTE — ED Notes (Signed)
Refusing vitals
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
## 2018-04-10 NOTE — ED Provider Notes (Signed)
[facility] EMERGENCY DEPARTMENT Provider Note   CSN: [id number] Arrival date & time: 04/10/18  1717    History   Chief Complaint Chief Complaint  Patient presents with  . Psychiatric Evaluation    HPI Andrew Chaney is a 63 y.o. male.     The history is provided by the patient and the EMS personnel.  Mental Health Problem  Presenting symptoms: agitation, bizarre behavior, disorganized speech and disorganized thought process   Presenting symptoms: no suicidal thoughts   Patient accompanied by: EMS. Degree of incapacity (severity):  Moderate Onset quality:  Unable to specify Chronicity:  Chronic Context: noncompliance   Associated symptoms: no abdominal pain and no chest pain   Risk factors: hx of mental illness     Past Medical History:  Diagnosis Date  . Bipolar 1 disorder ([place])   . 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 Renal disorder     Patient Active Problem List   Diagnosis Date Noted  . Bipolar I disorder, current or most recent episode manic, with psychotic features ([place]) 04/08/2018  . Confusion 01/03/2015  . Chronic hepatitis C ([place]) 12/20/2012  . Renal failure 12/17/2012  . Acute encephalopathy 12/17/2012  . ARF (acute renal failure) ([place]) 12/17/2012  . Hyponatremia 12/17/2012  . CAP (community acquired pneumonia) 12/17/2012  . Rhabdomyolysis 12/17/2012  . 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      Home Medications    Prior to Admission medications   Medication Sig Start Date End Date Taking? Authorizing Provider  clotrimazole (LOTRIMIN) 1 % cream Apply to affected area 2 times daily 04/09/18   Tegeler, Gwenyth Allegra, MD  diazepam (VALIUM) 5 MG tablet Take 1 tablet (5 mg total) by mouth at bedtime as needed and may repeat dose one time if needed for anxiety. Patient not taking: Reported on 12/31/2014 03/13/13   Junius Creamer, NP  diazepam (VALIUM) 5 MG tablet Take 1 tablet  (5 mg total) by mouth at bedtime as needed (insomnia). Patient not taking: Reported on 12/31/2014 07/13/13   Clayton Bibles, PA-C  gabapentin (NEURONTIN) 300 MG capsule Take 1 capsule (300 mg total) by mouth 2 (two) times daily. 04/09/18   Ethelene Hal, NP  methocarbamol (ROBAXIN) 500 MG tablet Take 1 tablet (500 mg total) by mouth 2 (two) times daily as needed for muscle spasms. Patient not taking: Reported on 12/31/2014 05/13/13   Pisciotta, Elmyra Ricks, PA-C  risperiDONE (RISPERDAL) 1 MG tablet Take 1 tablet (1 mg total) by mouth 2 (two) times daily. 04/09/18   Ethelene Hal, NP    Family History No family history on file.  Social History Social History   Tobacco Use  . Smoking status: Current Every Day Smoker    Types: Cigarettes  . Smokeless tobacco: Never Used  Substance Use Topics  . Alcohol use: Yes  . Drug use: Yes    Types: Marijuana    Comment: States no longer uses marijuana because he is "on probation"     Allergies   Patient has no known allergies.   Review of Systems Review of Systems  Constitutional: Negative for chills and fever.  HENT: Negative for ear pain and sore throat.   Eyes: Negative for pain and visual disturbance.  Respiratory: Negative for cough and shortness of breath.   Cardiovascular: Negative for chest pain and palpitations.  Gastrointestinal: Negative for abdominal pain and vomiting.  Genitourinary: Negative for dysuria and  hematuria.  Musculoskeletal: Negative for arthralgias and back pain.  Skin: Negative for color change and rash.  Neurological: Negative for seizures and syncope.  Psychiatric/Behavioral: Positive for agitation. Negative for suicidal ideas.  All other systems reviewed and are negative.    Physical Exam Updated Vital Signs There were no vitals taken for this visit.  Physical Exam Vitals signs and nursing note reviewed.  Constitutional:      Appearance: He is well-developed.  HENT:     Head: Normocephalic and  atraumatic.  Eyes:     Extraocular Movements: Extraocular movements intact.     Conjunctiva/sclera: Conjunctivae normal.     Pupils: Pupils are equal, round, and reactive to light.  Neck:     Musculoskeletal: Neck supple.  Cardiovascular:     Rate and Rhythm: Normal rate and regular rhythm.     Pulses: Normal pulses.     Heart sounds: Normal heart sounds. No murmur.  Pulmonary:     Effort: Pulmonary effort is normal. No respiratory distress.     Breath sounds: Normal breath sounds.  Abdominal:     Palpations: Abdomen is soft.     Tenderness: There is no abdominal tenderness.  Skin:    General: Skin is warm and dry.  Neurological:     Mental Status: He is alert.  Psychiatric:        Attention and Perception: He is inattentive.        Mood and Affect: Affect is labile.        Speech: Speech is rapid and pressured and tangential.        Behavior: Behavior is agitated.        Thought Content: Thought content is paranoid and delusional. Thought content does not include homicidal or suicidal ideation.        Judgment: Judgment is impulsive and inappropriate.      ED Treatments / Results  Labs (all labs ordered are listed, but only abnormal results are displayed) Labs Reviewed  COMPREHENSIVE METABOLIC PANEL  ETHANOL  RAPID URINE DRUG SCREEN, HOSP PERFORMED  CBC WITH DIFFERENTIAL/PLATELET    EKG None  Radiology No results found.  Procedures Procedures (including critical care time)  Medications Ordered in ED Medications  ziprasidone (GEODON) injection 10 mg (has no administration in time range)  LORazepam (ATIVAN) injection 2 mg (has no administration in time range)  sterile water (preservative free) injection (has no administration in time range)  LORazepam (ATIVAN) injection 0-4 mg (has no administration in time range)    Or  LORazepam (ATIVAN) tablet 0-4 mg (has no administration in time range)  LORazepam (ATIVAN) injection 0-4 mg (has no administration in time  range)    Or  LORazepam (ATIVAN) tablet 0-4 mg (has no administration in time range)  thiamine (VITAMIN B-1) tablet 100 mg (has no administration in time range)    Or  thiamine (B-1) injection 100 mg (has no administration in time range)     Initial Impression / Assessment and Plan / ED Course  I have reviewed the triage vital signs and the nursing notes.  Pertinent labs & imaging results that were available during my care of the patient were reviewed by me and considered in my medical decision making (see chart for details).     Andrew Chaney is a 63 year old male with history of bipolar disorder, hepatitis C who presents to the ED with bizarre behavior.  Patient arrives with EMS as he was in public acting strangely and they were concerned about his  mental wellbeing.  Patient denies any suicidal homicidal ideation but overall he is very inattentive.  He appears labile, speech appears rapid and pressured and tangential.  He denies any alcohol or drug use.  Clinically he appears sober.  He has multiple delusions including that he thinks he is [name].  He is asking me to biblical passages.  He continues to state over and over that he wants to go to a place where there is painted doors and he appears to be describing somewhere that we do not know what he is talking about.  Overall this could be his baseline but he does appear to exhibit signs of mania likely secondary to noncompliance given his history of bipolar disorder.  Overall, IVC was filled out because I do believe that psychiatry should evaluate the patient for possible admission given that he has had multiple visits recently exhibiting this type of behavior.  Geodon and Ativan is ordered if patient becomes aggressive or is refusing to stay.  At this time he is able to be redirected and we are awaiting TTS consultation.  TTS evaluated the patient and they recommend inpatient treatment.  This chart was dictated using voice recognition  software.  Despite best efforts to proofread,  errors can occur which can change the documentation meaning.    Final Clinical Impressions(s) / ED Diagnoses   Final diagnoses:  Psychosis, unspecified psychosis type [facility])    ED Discharge Orders    None       Lennice Sites, DO 04/10/18 1902

## 2018-04-10 NOTE — BH Assessment (Signed)
Tele Assessment Note   Patient Name: Andrew Chaney MRN: [id number] Referring Physician: Lennice Sites Location of Patient:  Location of Provider: [place] Department  Patient presented to [facility] via EMS.  He was transported to the ED for bizarre behavior and probable psychosis.  Ptient was just discharged from the [facility] yesterday by Dr. Darleene Cleaver.  Patient is currently very agitated and restless.  He is currently standing in the corner looking at the wall and will not make eye contact.  RN staff states that he has been fixated on the fact that he is not in the room that he was in yesterday.  He is refusing to drink water out of a cup and states that he needs bottled water.  Patient had long pauses prior to speaking when asked questions and when he answered questions, the answers were not appropriate.  He appears to be possibly be responding to internal stimuli.  Patient asked TTS, "do you know the hospital?"  When TTS informed patient that he had minimal knowledge of the hospital having only worked down there a few times, patient states, "I want to talk to someone who knows the hospital."  Patient refused to speak anymore.  Obviously, patient is not capable of making rational decisions at present.  His judgment, insight and impulse control are poor.  His thoughts are very loose and disorganized.  He has been delusional in the ED and has told people that he is God.  His mood appears to be depressed and his affect flat and blunted.  Psychiatric Consult Note by Dr. Darleene Cleaver completed on 04/09/2018 Pt was seen and chart reviewed with treatment team and Dr Darleene Cleaver. Pt denies suicidal/homicidal ideation, denies auditory/visual hallucinations and does not appear to be responding to internal stimuli. Pt remained in the emergency room overnight. He is intrusive and demanding of staff. He refuses to speak to anyone unless they address him as "Andrew." Pt is disorganized. According to extensive chart  review this appears to be Pt's baseline behavior. Pt was picked up on the street for bizarre behavior. He is known to this ED system. Pt often refuses to take medication but he has received medications in the ED. Pt will be given outpatient and shelter resources upon discharge. Pt will also receive prescriptions for the mental health medications he received while in the ED.  Pt is psychiatrically clear.   TTS assessment note from 3/27 by Odetta Pink Jarrett Albor is an 63 y.o. male, who present involuntary and unaccompanied to [facility]. Per chart, pt was assessed at [facility] for a similar presentation on April 05, 2018. Per chart was given Geodon 10 mg and Ativan 1 mg due to increased agitation, yelling, and trying to leave the ED. Pt reported, "please call me Andrew Chaney." Clinician asked the pt, "what brought you to the hospital?" Pt reported, "I can't walk very far." Pt then asked, "please get my clothes so I can catch a cab." Clinician attempted to explain to the pt that he is under IVC and can not leave however the pt continued to repeat himself saying he was ready to go, so he can get some rest. Pt reported, wanting to fly to [place] to get some marijuana and bring it to [place]. At times the pt would answer questions with a question. Pt denies, SI, HI, self-injurious behaviors and access to weapons.   Pt was IVC'd by EDP. Per IVC paperwork: "Patient here with bizarre behaviors and psychotic behavior, He was found in the  middle of the street responding to internal stimuli."  Clinician ws unable to assess the following: "living arrangements, being linked to OPT resources, family suicide history, symptoms of depression, previous inpatient admissions, legal involvement, impulse control, sleep, appetite, family history (mental health and substance, history of abuse, previous suicide attempts, substance use, vegetative symptoms, CPS/APS involvement, mood, affect, contract for safety."   Pt  presents disheveled in scrubs with soft speech. Pt's eye contact was poor. Pt's thought process was circumstantial and tangential. Pt's judgement is impaired. Pt's concentration, and insight are poor.   Diagnosis: Bipolar 1 Disorder.  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 Bipolar 1 disorder ([place])   . 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 Renal disorder     No past surgical history on file.  Family History: No family history on file.  Social History:  reports that he has been smoking cigarettes. He has never used smokeless tobacco. He reports current alcohol use. He reports current drug use. Drug: Marijuana.  Additional Social History:  Alcohol / Drug Use Pain Medications: See MAR Prescriptions: See MAR Over the Counter: See MAR History of alcohol / drug use?: No history of alcohol / drug abuse Longest period of sobriety (when/how long): Unknown/N/A  CIWA:   COWS:    Allergies: No Known Allergies  Home Medications: (Not in a hospital admission)   OB/GYN Status:  No LMP for male patient.  General Assessment Data Location of Assessment: [facility] ED TTS Assessment: In system Is this a Tele or Face-to-Face Assessment?: Tele Assessment Is this an Initial Assessment or a Re-assessment for this encounter?: Initial Assessment Patient Accompanied by:: N/A(brought in by EMS) Language Other than English: No Living Arrangements: (unable to assess) What gender do you identify as?: Male Marital status: (unknown) Living Arrangements: Other (Comment)(unknown) Can pt return to current living arrangement?: (unknown) Admission Status: Voluntary Is patient capable of signing voluntary admission?: No Referral Source: Other(EMS) Insurance type: Medicaid     Crisis Care Plan Living Arrangements: Other (Comment)(unknown) Legal Guardian: Other:(self) Name of Psychiatrist: UTA Name of  Therapist: UTA  Education Status Is patient currently in school?: No Is the patient employed, unemployed or receiving disability?: Receiving disability income  Risk to self with the past 6 months Suicidal Ideation: (UTA) Has patient been a risk to self within the past 6 months prior to admission? : (UTA) Suicidal Intent: (UTA) Has patient had any suicidal intent within the past 6 months prior to admission? : (UTA) Is patient at risk for suicide?: (UTA) Suicidal Plan?: (UTA) Has patient had any suicidal plan within the past 6 months prior to admission? : (UTA) Access to Means: (UTA) What has been your use of drugs/alcohol within the last 12 months?: (UTA) Previous Attempts/Gestures: (UTA) How many times?: (UTA) Other Self Harm Risks: (UTA) Triggers for Past Attempts: (UTA) Intentional Self Injurious Behavior: (UTA) Family Suicide History: (UTA) Recent stressful life event(s): (UTA) Persecutory voices/beliefs?: Andrew Chaney) Depression: (UTA) Depression Symptoms: (UTA) Substance abuse history and/or treatment for substance abuse?: (UTA) Suicide prevention information given to non-admitted patients: (UTA)  Risk to Others within the past 6 months Homicidal Ideation: (UTA) Does patient have any lifetime risk of violence toward others beyond the six months prior to admission? : (UTA) Thoughts of Harm to Others: (UTA) Current Homicidal Intent: (UTA) Current Homicidal Plan: (UTA) Access to Homicidal Means: (UTA) Identified Victim: UTA History of harm to others?: (UTA)  Assessment of Violence: (UTA) Violent Behavior Description: (UTA) Does patient have access to weapons?: (UTA) Criminal Charges Pending?: (UTA) Does patient have a court date: (UTA) Is patient on probation?: No  Psychosis Hallucinations: Auditory(appeares to be responding to internal stimuli) Delusions: Grandiose(thinks he is Andrew)  Mental Status Report Appearance/Hygiene: Unremarkable Eye Contact: Poor Motor  Activity: Freedom of movement, Restlessness Speech: Logical/coherent Level of Consciousness: Alert, Restless Mood: Anxious, Anhedonia Affect: Blunted, Flat Anxiety Level: Moderate Thought Processes: Thought Blocking Judgement: Impaired Orientation: Person, Place, Time Obsessive Compulsive Thoughts/Behaviors: Severe(gets fixated on things)  Cognitive Functioning Concentration: Decreased Memory: Unable to Assess Is patient IDD: No Insight: Poor Impulse Control: Poor Appetite: (unable to assess) Have you had any weight changes? : (unknown) Sleep: Unable to Assess Vegetative Symptoms: (unable to assess)  ADLScreening [facility] Assessment Services) Patient's cognitive ability adequate to safely complete daily activities?: Yes Patient able to express need for assistance with ADLs?: Yes Independently performs ADLs?: Yes (appropriate for developmental age)  Prior Inpatient Therapy Prior Inpatient Therapy: (UTA)  Prior Outpatient Therapy Prior Outpatient Therapy: (UTA) Does patient have an ACCT team?: (UTA) Does patient have Intensive In-House Services?  : (UTA) Does patient have [facility] services? : (UTA) Does patient have P4CC services?: (UTA)  ADL Screening (condition at time of admission) Patient's cognitive ability adequate to safely complete daily activities?: Yes Is the patient deaf or have difficulty hearing?: No Does the patient have difficulty seeing, even when wearing glasses/contacts?: No Does the patient have difficulty concentrating, remembering, or making decisions?: Yes Patient able to express need for assistance with ADLs?: Yes Does the patient have difficulty dressing or bathing?: No Independently performs ADLs?: Yes (appropriate for developmental age) Does the patient have difficulty walking or climbing stairs?: No Weakness of Legs: None Weakness of Arms/Hands: None  Home Assistive Devices/Equipment Home Assistive Devices/Equipment: None  Therapy Consults  (therapy consults require a physician order) PT Evaluation Needed: No OT Evalulation Needed: No SLP Evaluation Needed: No Abuse/Neglect Assessment (Assessment to be complete while patient is alone) Abuse/Neglect Assessment Can Be Completed: Unable to assess, patient is non-responsive or altered mental status     Advance Directives (For Healthcare) Does Patient Have a Medical Advance Directive?: No Would patient like information on creating a medical advance directive?: No - Patient declined Nutrition Screen- [facility] Adult/WL/AP Has the patient recently lost weight without trying?: (unable to assess, patient is psychotic)        Disposition: Per Ricky Ala, NP, Patient meets inpatient admission criteria Disposition Initial Assessment Completed for this Encounter: Yes  This service was provided via telemedicine using a 2-way, interactive audio and video technology.  Names of all persons participating in this telemedicine service and their role in this encounter. Name: Kanyon Seibold Role: patient  Name: Nariyah Osias Role: TTS  Name:  Role:   Name:  Role:     Reatha Armour 04/10/2018 6:35 PM

## 2018-04-10 NOTE — ED Triage Notes (Signed)
Pt bib ems from [facility] where he requested ems be called. Ems states when they arrived on scene pt would not talk to them. After approx 20 minutes pt told them his name was Andrew Chaney. Pt refused any vitals with ems.

## 2018-04-10 NOTE — ED Notes (Signed)
Unable to complete required screenings, pt delusional

## 2018-04-10 NOTE — Progress Notes (Signed)
Pt meets inpatient criteria per Ricky Ala, NP. Referral information has been sent to the following hospitals for review:  [facility]  [place]  [facility]  [facility]  [facility]  [facility]   Disposition will continue to assist with inpatient placement needs.   Audree Camel, LCSW, [place] Disposition [facility]/TTS [phone]

## 2018-04-11 ENCOUNTER — Inpatient Hospital Stay (HOSPITAL_COMMUNITY)
Admission: AD | Admit: 2018-04-11 | Discharge: 2018-04-13 | DRG: 885 | Disposition: A | Discharge status: Home / Self Care | Payer: No Typology Code available for payment source | Source: Intra-hospital | Attending: Psychiatry | Admitting: Psychiatry

## 2018-04-11 ENCOUNTER — Other Ambulatory Visit: Payer: Self-pay | Admitting: Registered Nurse

## 2018-04-11 ENCOUNTER — Encounter (HOSPITAL_COMMUNITY): Payer: Self-pay

## 2018-04-11 ENCOUNTER — Emergency Department (HOSPITAL_COMMUNITY): Payer: Self-pay

## 2018-04-11 ENCOUNTER — Other Ambulatory Visit: Payer: Self-pay

## 2018-04-11 DIAGNOSIS — Z590 Homelessness: Secondary | ICD-10-CM

## 2018-04-11 DIAGNOSIS — F29 Unspecified psychosis not due to a substance or known physiological condition: Secondary | ICD-10-CM | POA: Diagnosis present

## 2018-04-11 DIAGNOSIS — F312 Bipolar disorder, current episode manic severe with psychotic features: Secondary | ICD-10-CM

## 2018-04-11 DIAGNOSIS — Z9119 Patient's noncompliance with other medical treatment and regimen: Secondary | ICD-10-CM

## 2018-04-11 DIAGNOSIS — F319 Bipolar disorder, unspecified: Secondary | ICD-10-CM | POA: Diagnosis present

## 2018-04-11 DIAGNOSIS — Z79899 Other long term (current) drug therapy: Secondary | ICD-10-CM

## 2018-04-11 DIAGNOSIS — F17210 Nicotine dependence, cigarettes, uncomplicated: Secondary | ICD-10-CM | POA: Diagnosis not present

## 2018-04-11 DIAGNOSIS — F22 Delusional disorders: Secondary | ICD-10-CM | POA: Diagnosis present

## 2018-04-11 DIAGNOSIS — F1220 Cannabis dependence, uncomplicated: Secondary | ICD-10-CM | POA: Diagnosis present

## 2018-04-11 DIAGNOSIS — Z8547 Personal history of malignant neoplasm of testis: Secondary | ICD-10-CM | POA: Diagnosis not present

## 2018-04-11 DIAGNOSIS — F3110 Bipolar disorder, current episode manic without psychotic features, unspecified: Secondary | ICD-10-CM | POA: Diagnosis present

## 2018-04-11 LAB — RAPID URINE DRUG SCREEN, HOSP PERFORMED
Amphetamines: NOT DETECTED
BENZODIAZEPINES: NOT DETECTED
Barbiturates: NOT DETECTED
Cocaine: NOT DETECTED
Opiates: NOT DETECTED
Tetrahydrocannabinol: POSITIVE — AB

## 2018-04-11 MED ORDER — TEMAZEPAM 15 MG PO CAPS
30.0000 mg | ORAL_CAPSULE | Freq: Every day | ORAL | Status: DC
  Administered 2018-04-11: 30 mg via ORAL
  Filled 2018-04-11: qty 2

## 2018-04-11 MED ORDER — HALOPERIDOL 5 MG PO TABS: 5.0000 mg | ORAL_TABLET | Freq: Four times a day (QID) | ORAL | Status: DC | PRN

## 2018-04-11 MED ORDER — HALOPERIDOL LACTATE 5 MG/ML IJ SOLN: 5.0000 mg | Freq: Four times a day (QID) | INTRAMUSCULAR | Status: DC | PRN

## 2018-04-11 MED ORDER — RISPERIDONE 3 MG PO TABS
3.0000 mg | ORAL_TABLET | Freq: Two times a day (BID) | ORAL | Status: DC
  Administered 2018-04-11 – 2018-04-12 (×3): 3 mg via ORAL
  Filled 2018-04-11 (×5): qty 1
  Filled 2018-04-11: qty 3
  Filled 2018-04-11 (×3): qty 1

## 2018-04-11 MED ORDER — HYDROXYZINE HCL 50 MG PO TABS: 50.0000 mg | ORAL_TABLET | Freq: Three times a day (TID) | ORAL | Status: DC | PRN

## 2018-04-11 MED ORDER — IBUPROFEN 800 MG PO TABS: 800.0000 mg | ORAL_TABLET | Freq: Four times a day (QID) | ORAL | Status: DC | PRN

## 2018-04-11 MED ORDER — BENZTROPINE MESYLATE 0.5 MG PO TABS
0.5000 mg | ORAL_TABLET | Freq: Two times a day (BID) | ORAL | Status: DC
  Filled 2018-04-11 (×9): qty 1

## 2018-04-11 MED ORDER — CLOTRIMAZOLE 1 % EX CREA: TOPICAL_CREAM | CUTANEOUS | 0 refills | Status: DC

## 2018-04-11 NOTE — BHH Suicide Risk Assessment (Signed)
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  World Pharmacologist [facility]). Score between 0-7:  no or low risk or alcohol related problems. Score between 8-15:  moderate risk of alcohol related problems. Score between 16-19:  high risk of alcohol related problems. Score 20 or above:  warrants further diagnostic evaluation for alcohol dependence and treatment.   CLINICAL FACTORS:   Bipolar Disorder:   Mixed State   COGNITIVE FEATURES THAT CONTRIBUTE TO RISK:  Loss of executive function and Thought constriction (tunnel vision)    SUICIDE RISK:   Minimal: No identifiable suicidal ideation.  Patients presenting with no risk factors but with morbid ruminations; may be classified as minimal risk based on the severity of the depressive symptoms  PLAN OF CARE: Admitted for stabilization  I certify that inpatient services furnished can reasonably be expected to improve the patient's condition.   Johnn Hai, MD 04/11/2018, 1:38 PM
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
## 2018-04-11 NOTE — Progress Notes (Signed)
Patient ID: Andrew Chaney, male   DOB: 1955/11/03, 63 y.o.   MRN: [id number] Patient reports being [facility].  Patient stated he has been on a mission to save the world.  Patient reports he has been homeless and wandering for the last 30 years.  Patient currently denies SI, HI and AVH.    Skin assessment complete and patient found to have a budging area in the middle of his chest that he referred to as a lypoma.  Patient also was noted to have bilateral lower leg edema.  No contraband noted patient admitted to the unit with no incident.

## 2018-04-11 NOTE — ED Notes (Signed)
Pt in shower.  

## 2018-04-11 NOTE — ED Notes (Signed)
[facility] transporting pt to [facility] belongings - 1 labeled belongings bag - [employer] w/pt's shoes. Per documentation, pt had thrown his clothing away d/t soiled w/feces and urine.

## 2018-04-11 NOTE — ED Notes (Signed)
Pt being transported to x-ray. Sitter w/pt.

## 2018-04-11 NOTE — ED Notes (Signed)
Pt given Ativan d/t noted to be anxious. Pt asking for med for groin rash - Will ask ED APP to prescribe. Pt eating snack given.

## 2018-04-11 NOTE — [facility] Suicide Risk Assessment (Signed)
CSW met 1:1 with pt and reviewed the Suicide Prevention Education brochure handout with patient. CSW gave patient a copy and the number to mobile crisis.  Patient stated "I don't need that, I would never commit suicide. I am just going to throw it in the trash".

## 2018-04-11 NOTE — ED Provider Notes (Signed)
63 year old male here under behavioral health evaluation.  He has been medically cleared and accepted at [facility].  Patient discharged at this time.  Vitals:   04/11/18 0639 04/11/18 0726  BP: (!) 92/55 (!) 92/55  Pulse: 76 76  Resp: 17   Temp: 98.2 F (36.8 C)   SpO2: 95%       Okey Regal, PA-C 04/11/18 1011    Mesner, Corene Cornea, MD 04/11/18 1338

## 2018-04-11 NOTE — ED Notes (Signed)
Pt has ambulated to bathroom x 2 - attempted to close door both times - Sitter reminds pt to leave door cracked - pt states "Oh, I didn't know".

## 2018-04-11 NOTE — ED Notes (Signed)
Pt introducing himself to each staff member he sees and states "Nice to meet you. I'm Andrew Chaney".

## 2018-04-11 NOTE — ED Notes (Signed)
Pt noted to be restless - ambulating out of room asking for multiple items - one at a time - ice water, assistance w/wrapping blanket around him, more salt, more butter for breakfast meal. Pt noted to become upset when advised there extra salt and butter are not available. Pt states "This is ridiculous!" Asked pt to remain in his room unless he needs to ambulate to bathroom - voiced understanding and understanding of Medical Clearance Pt Policy form - copy given.

## 2018-04-11 NOTE — Progress Notes (Signed)
CSW noted that patient's Insurance status has changed and it appears that he has no insurance.  He can not go to any of the private stand-alone hospitals as he would have to pay out of pocket and does not have the resources.  Patient will need to be re-referred to public hospitals, including [facility].  TTS and ED staff notified of same. Will ask [facility] AC to review chart.  Areatha Keas. Judi Cong, MSW, [place] Disposition Clinical Social Work [phone] (cell) [phone] (office)

## 2018-04-11 NOTE — Progress Notes (Signed)
Patient being reviewed by [facility] for admission. Boykin Nearing has requested patient receive a chest x-ray and new EKG (last one was on 04/05/18 more than three days ago and patient had since been released from the hospital.). Robb Matar, RN was notified and agrees to request orders for both.  CSW will fax UA, UDS results to [facility] now and will follow-up on EKG and CHEST x-ray results later this morning.  Areatha Keas. Judi Cong, MSW, [place] Disposition Clinical Social Work [phone] (cell) [phone] (office)
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
## 2018-04-11 NOTE — ED Notes (Signed)
Breakfast Tray Ordered. 

## 2018-04-11 NOTE — Tx Team (Signed)
Initial Treatment Plan 04/11/2018 1:51 PM Andrew Chaney LUD:[id number]    PATIENT STRESSORS: Medication change or noncompliance   PATIENT STRENGTHS: Communication skills General fund of knowledge   PATIENT IDENTIFIED PROBLEMS: "I am Jesus"                     DISCHARGE CRITERIA:  Improved stabilization in mood, thinking, and/or behavior  PRELIMINARY DISCHARGE PLAN: Return to previous work or school arrangements  PATIENT/FAMILY INVOLVEMENT: This treatment plan has been presented to and reviewed with the patient, Andrew Chaney, and/or family member, .  The patient and family have been given the opportunity to ask questions and make suggestions.  Clarita Crane, RN 04/11/2018, 1:51 PM

## 2018-04-11 NOTE — Progress Notes (Signed)
Patient accepted to [facility], Bed 502-1 Shuvon Rankin, NP is the accepting provider.  Aaron Edelman, MD is the attending provider.  Call report to [phone]  Robb Matar, RN @ [facility] notified.   Pt is IVC  Pt may be transported by [employer] Pt scheduled  to arrive at [facility] as soon as transport can be arranged.  Areatha Keas. Judi Cong, MSW, [place] Disposition Clinical Social Work [phone] (cell) [phone] (office)
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
## 2018-04-12 MED ORDER — PALIPERIDONE PALMITATE ER 234 MG/1.5ML IM SUSY
234.0000 mg | PREFILLED_SYRINGE | Freq: Once | INTRAMUSCULAR | Status: AC
  Administered 2018-04-12: 17:00:00 234 mg via INTRAMUSCULAR
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
## 2018-04-12 NOTE — [facility] Suicide Risk Assessment (Signed)
[name] INPATIENT:  Family/Significant Other Suicide Prevention Education  Suicide Prevention Education:  Contact Attempts: Azazel Franze, brother, [phone], has been identified by the patient as the family member/significant other with whom the patient will be residing, and identified as the person(s) who will aid the patient in the event of a mental health crisis.  With written consent from the patient, two attempts were made to provide suicide prevention education, prior to and/or following the patient's discharge.  We were unsuccessful in providing suicide prevention education.  A suicide education pamphlet was given to the patient to share with family/significant other.  Date and time of first attempt: 04/12/18, 1100 Date and time of second attempt:  [name] 04/12/2018, 10:59 AM

## 2018-04-12 NOTE — Progress Notes (Signed)
Recreation Therapy Notes  Date: 4.1.20 Time: 1000 Location: 500 Hall Dayroom  Group Topic: Coping Skills  Goal Area(s) Addresses:  Patient will identify positive and negative coping skills. Patient will identify benefits of positive coping skills. Patient will identify consequences of negative coping skills.  Intervention:  Worksheet, pencils  Activity:  Healthy vs. Unhealthy Coping Strategies.  Patients were to identify a problem they are currently dealing with.  Patients then identified the unhealthy coping strategies they used when dealing with that problem and the consequences.  Patients also identified positive coping strategies, the benefits and the barriers that prevent them from using these coping strategies.  Education: Radiographer, therapeutic, Dentist.   Education Outcome: Acknowledges understanding/In group clarification offered/Needs additional education.   Clinical Observations/Feedback: Pt did not attend group.     Victorino Sparrow, LRT/CTRS         Victorino Sparrow A 04/12/2018 11:34 AM

## 2018-04-12 NOTE — Plan of Care (Signed)
Patient was pleasant and euphoric upon approach this morning. Patient denies SI HI AVH. Denies physical pain. Patient claimed he slept well and denied questions for the doctor. Throughout the shift, patient has been irritable, demanding, and intrusive. Patient is easily angered if requests are denied, but is somewhat redirectable. Safety is maintained with 15 minute checks as well as environmental checks. Will continue to monitor and provide support.  Problem: Education: Goal: Knowledge of [place] [facility] Education information/materials will improve Outcome: Progressing Goal: Emotional status will improve Outcome: Progressing Goal: Mental status will improve 04/12/2018 1450 by Megan Mans, RN Outcome: Progressing 04/12/2018 1400 by Megan Mans, RN Outcome: Not Progressing Goal: Verbalization of understanding the information provided will improve 04/12/2018 1450 by Megan Mans, RN Outcome: Progressing 04/12/2018 1400 by Megan Mans, RN Outcome: Not Progressing

## 2018-04-12 NOTE — Progress Notes (Signed)
Nursing Progress Note: 7p-7a D: Pt currently presents with a agitated/angry/noncompliant/uncooperative affect and behavior. Pt states, "I am [name]. Im getting tired of this. I don't belong here, and you know it. Release me. I haven't eaten all day either. I don't want food from here. I want food from the outside." Not Interacting with the milieu.   A: Pt offered medications per providers orders. Pt's labs and vitals were monitored throughout the night. Pt supported emotionally and encouraged to express concerns and questions. Pt educated on medications.  R: Pt's safety ensured with 15 minute and environmental checks. Pt currently denies SI, HI, and AVH. Pt verbally contracts to seek staff if SI,HI, or AVH occurs and to consult with staff before acting on any harmful thoughts. Will continue to monitor.

## 2018-04-12 NOTE — Progress Notes (Signed)
Patient is talking irrationally, refusing to go to bed saying, "I will not sleep.Marland Kitchenibuprofen will not sleep until I go somewhere proper".   Patient is also refusing to take any medications.

## 2018-04-12 NOTE — Tx Team (Signed)
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 benztropine (COGENTIN) tablet 0.5 mg  0.5 mg Oral BID Johnn Hai, MD      . haloperidol (HALDOL) tablet 5 mg  5 mg Oral Q6H PRN Johnn Hai, MD       Or  . haloperidol lactate (HALDOL) injection 5 mg  5 mg Intramuscular Q6H PRN Johnn Hai, MD      . hydrOXYzine (ATARAX/VISTARIL) tablet 50 mg  50 mg Oral TID PRN Johnn Hai, MD      . ibuprofen (ADVIL,MOTRIN) tablet 800 mg  800 mg Oral Q6H PRN Johnn Hai, MD      . paliperidone (INVEGA SUSTENNA) injection 234 mg  234 mg Intramuscular Once Johnn Hai, MD      . risperiDONE (RISPERDAL) tablet 3 mg  3 mg Oral BID Johnn Hai, MD   3 mg at 04/12/18 0744  . temazepam (RESTORIL) capsule 30 mg  30 mg Oral QHS Johnn Hai, MD   30 mg at 04/11/18 2051   PTA Medications: No medications prior to admission.    Patient Stressors: Medication change or noncompliance  Patient Strengths: Curator fund of knowledge  Treatment Modalities: Medication Management, Group therapy, Case management,  1 to 1 session with clinician, Psychoeducation, Recreational therapy.   Physician Treatment Plan for Primary Diagnosis: <principal problem not specified> Long Term Goal(s): Improvement in symptoms so as ready for discharge Improvement in symptoms so as ready for discharge   Short Term Goals: Ability to disclose and discuss suicidal ideas Ability to maintain clinical measurements within normal limits will improve  Medication Management: Evaluate patient's response, side effects, and  tolerance of medication regimen.  Therapeutic Interventions: 1 to 1 sessions, Unit Group sessions and Medication administration.  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  Therapeutic Interventions: 1 to 1 sessions, Unit Group sessions and Medication administration.  Evaluation of Outcomes: Not Met   RN Treatment Plan for Primary Diagnosis: <principal problem not specified> Long Term Goal(s): Knowledge of disease and therapeutic regimen to maintain health will improve  Short Term Goals: Ability to identify and develop effective coping behaviors will improve and Compliance with prescribed medications will improve  Medication Management: RN will administer medications as ordered by provider, will assess and evaluate patient's response and provide education to patient for prescribed medication. RN will report any adverse and/or side effects to prescribing provider.  Therapeutic Interventions: 1 on 1 counseling sessions, Psychoeducation, Medication administration, Evaluate responses to treatment, Monitor vital signs and CBGs as ordered, Perform/monitor CIWA, COWS, AIMS and Fall Risk screenings as ordered, Perform wound care treatments as ordered.  Evaluation of Outcomes: Not Met   LCSW Treatment Plan for Primary Diagnosis: <principal problem  not specified> Long Term Goal(s): Safe transition to appropriate next level of care at discharge, Engage patient in therapeutic  group addressing interpersonal concerns.  Short Term Goals: Engage patient in aftercare planning with referrals and resources, Increase social support and Increase skills for wellness and recovery  Therapeutic Interventions: Assess for all discharge needs, 1 to 1 time with Social worker, Explore available resources and support systems, Assess for adequacy in community support network, Educate family and significant other(s) on suicide prevention, Complete Psychosocial Assessment, Interpersonal group therapy.  Evaluation of Outcomes: Not Met   Progress in Treatment: Attending groups: No. Participating in groups: No. Taking medication as prescribed: Yes. Toleration medication: Yes. Family/Significant other contact made: Yes, individual(s) contacted:  brother Patient understands diagnosis: No. Discussing patient identified problems/goals with staff: Yes. Medical problems stabilized or resolved: Yes. Denies suicidal/homicidal ideation: Yes. Issues/concerns per patient self-inventory: No. Other: none  New problem(s) identified: No, Describe:  none  New Short Term/Long Term Goal(s):  Patient Goals:  Pt showed CSW his daily goal sheet that referenced "finding church members."  Discharge Plan or Barriers:   Reason for Continuation of Hospitalization: Delusions  Mania Medication stabilization  Estimated Length of Stay: 2-4 days.  Attendees: Patient:Andrew Chaney 04/12/2018   Physician: Dr Jake Samples, MD 04/12/2018   Nursing: Megan Mans, RN 04/12/2018   RN Care Manager: 04/12/2018   Social Worker: Lurline Idol, LCSW 04/12/2018   Recreational Therapist:  04/12/2018   Other:  04/12/2018   Other:  04/12/2018   Other: 04/12/2018        Scribe for Treatment Team: [name], [place] 04/12/2018 12:54 PM

## 2018-04-12 NOTE — Progress Notes (Signed)
[facility] MD Progress Note  04/12/2018 9:57 AM Andrew Chaney  MRN:  [id number] Subjective:    Patient generally stable in mood he is not particularly manic he is not particular depressed he is reporting no current auditory or visual hallucinations but does make some disjointed statements.  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 Bipolar 1 disorder ([place])   . 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 Renal disorder    History reviewed. No pertinent surgical history. Family History: History reviewed. No pertinent family history.  Social History:  Social History   Substance and Sexual Activity  Alcohol Use Yes     Social History   Substance and Sexual Activity  Drug Use Yes  . Types: Marijuana   Comment: States no longer uses marijuana because he is "on probation"    Social History   Socioeconomic History  . Marital status: Single    Spouse name: Not on file  . Number of children: Not on file  . Years of education: Not on file  . Highest education level: Not on file  Occupational History  . Not on file  Social Needs  . Financial resource strain: Not on file  . Food insecurity:    Worry: Not on file    Inability: Not on file  .  Transportation needs:    Medical: Not on file    Non-medical: Not on file  Tobacco Use  . Smoking status: Current Every Day Smoker    Types: Cigarettes  . Smokeless tobacco: Never Used  Substance and Sexual Activity  . Alcohol use: Yes  . Drug use: Yes    Types: Marijuana    Comment: States no longer uses marijuana because he is "on probation"  . Sexual activity: Not Currently  Lifestyle  . Physical activity:    Days per week: Not on file    Minutes per session: Not on file  . Stress: Not on file  Relationships  . Social connections:    Talks on phone: Not on file    Gets together: Not on file    Attends religious service: Not on file    Active member of club or organization: Not on file    Attends meetings of clubs or organizations: Not on file    Relationship status: Not on file  Other Topics Concern  . Not on file  Social History Narrative  . Not on file   Additional Social History:                         Sleep: Good  Appetite:  Good  Current Medications: Current Facility-Administered Medications  Medication Dose Route Frequency Provider Last Rate Last Dose  . benztropine (COGENTIN) tablet 0.5 mg  0.5 mg Oral BID Johnn Hai, MD      . haloperidol (HALDOL) tablet 5 mg  5 mg Oral Q6H PRN Johnn Hai, MD       Or  . haloperidol lactate (HALDOL) injection 5 mg  5 mg Intramuscular Q6H PRN Johnn Hai, MD      . hydrOXYzine (ATARAX/VISTARIL) tablet 50 mg  50 mg Oral TID PRN Johnn Hai, MD      . ibuprofen (ADVIL,MOTRIN) tablet 800 mg  800 mg Oral Q6H PRN Johnn Hai, MD      . paliperidone (INVEGA SUSTENNA) injection 234 mg  234 mg Intramuscular Once Johnn Hai, MD      . risperiDONE (RISPERDAL) tablet 3 mg  3 mg Oral BID Johnn Hai, MD   3 mg at 04/12/18 0744  . temazepam (RESTORIL) capsule 30 mg  30 mg Oral QHS Johnn Hai, MD   30 mg at 04/11/18 2051    Lab Results:  Results for orders placed or performed during the hospital encounter of  04/10/18 (from the past 48 hour(s))  Comprehensive metabolic panel     Status: Abnormal   Collection Time: 04/10/18  7:34 PM  Result Value Ref Range   Sodium 134 (L) 135 - 145 mmol/L   Potassium 4.4 3.5 - 5.1 mmol/L   Chloride 99 98 - 111 mmol/L   CO2 22 22 - 32 mmol/L   Glucose, Bld 103 (H) 70 - 99 mg/dL   BUN 26 (H) 6 - 20 mg/dL   Creatinine, Ser 1.30 (H) 0.61 - 1.24 mg/dL   Calcium 10.4 (H) 8.9 - 10.3 mg/dL   Total Protein 7.0 6.5 - 8.1 g/dL   Albumin 4.3 3.5 - 5.0 g/dL   AST 43 (H) 15 - 41 U/L   ALT 33 0 - 44 U/L   Alkaline Phosphatase 63 38 - 126 U/L   Total Bilirubin 1.6 (H) 0.3 - 1.2 mg/dL   GFR calc non Af Amer 59 (L) >60 mL/min   GFR calc Af Amer >60 >60 mL/min   Anion gap 13 5 - 15    Comment: Performed at [facility], [address], [place], [place] [address]  CBC with Diff     Status: None   Collection Time: 04/10/18  7:34 PM  Result Value Ref Range   WBC 7.8 4.0 - 10.5 K/uL   RBC 4.34 4.22 - 5.81 MIL/uL   Hemoglobin 13.4 13.0 - 17.0 g/dL   HCT 41.7 39.0 - 52.0 %   MCV 96.1 80.0 - 100.0 fL   MCH 30.9 26.0 - 34.0 pg   MCHC 32.1 30.0 - 36.0 g/dL   RDW 13.2 11.5 - 15.5 %   Platelets 261 150 - 400 K/uL   nRBC 0.0 0.0 - 0.2 %   Neutrophils Relative % 74 %   Neutro Abs 5.8 1.7 - 7.7 K/uL   Lymphocytes Relative 16 %   Lymphs Abs 1.2 0.7 - 4.0 K/uL   Monocytes Relative 10 %   Monocytes Absolute 0.7 0.1 - 1.0 K/uL   Eosinophils Relative 0 %   Eosinophils Absolute 0.0 0.0 - 0.5 K/uL   Basophils Relative 0 %   Basophils Absolute 0.0 0.0 - 0.1 K/uL   Immature Granulocytes 0 %   Abs Immature Granulocytes 0.03 0.00 - 0.07 K/uL    Comment: Performed at [facility], [address] [place], [place] [address]  Ethanol     Status: None   Collection Time: 04/10/18  7:35 PM  Result Value Ref Range   Alcohol, Ethyl (B) <10 <10 mg/dL    Comment: (NOTE) Lowest detectable limit for serum alcohol is 10 mg/dL. For medical purposes only. Performed at [facility], [place] [address], [place], [place] [address]   Urine rapid drug screen (hosp performed)     Status: Abnormal   Collection Time: 04/11/18  6:38 AM  Result Value Ref Range   Opiates NONE DETECTED NONE DETECTED   Cocaine NONE DETECTED NONE DETECTED   Benzodiazepines NONE DETECTED NONE DETECTED   Amphetamines NONE DETECTED NONE DETECTED   Tetrahydrocannabinol POSITIVE (A) NONE DETECTED   Barbiturates NONE DETECTED NONE DETECTED    Comment: (NOTE) DRUG SCREEN FOR MEDICAL PURPOSES ONLY.  IF CONFIRMATION IS NEEDED FOR ANY PURPOSE, NOTIFY LAB WITHIN 5 DAYS. LOWEST DETECTABLE LIMITS FOR URINE DRUG SCREEN Drug Class                     Cutoff (ng/mL) Amphetamine and metabolites    1000 Barbiturate and metabolites    200 Benzodiazepine                 017 Tricyclics and metabolites     300 Opiates and metabolites        300 Cocaine and metabolites        300 THC                            50 Performed at [facility], [place] [address] [place], [place] [address]     Blood Alcohol level:  Lab Results  Component Value Date   ETH <10 04/10/2018   ETH <10 67/59/1638    Metabolic Disorder Labs: No results found for: HGBA1C, MPG No results found for: PROLACTIN No results found for: CHOL, TRIG, HDL, CHOLHDL, VLDL, LDLCALC  Physical Findings: AIMS:  , ,  ,  ,    CIWA:    COWS:     Musculoskeletal: Strength & Muscle Tone: within normal limits Gait & Station: normal Patient leans: N/A  Psychiatric Specialty Exam: Physical Exam  ROS  Blood pressure 100/79, pulse (!) 103, temperature 98.2 F (36.8 C), temperature source Oral, resp. rate 18, height 6' (1.829 m), weight 80.7 kg.Body mass index is 24.14 kg/m.  General Appearance: Casual  Eye Contact:  Good  Speech:  Clear and Coherent  Volume:  Normal  Mood:  Euthymic  Affect:  Constricted  Thought Process:  Irrelevant and Descriptions of Associations: Loose  Orientation:  Full (Time, Place, and Person)  Thought  Content: Random thoughts expressed on interview  Suicidal Thoughts:  No  Homicidal Thoughts:  No  Memory:  Immediate;   Fair  Judgement:  Fair  Insight:  Fair  Psychomotor Activity:  Normal  Concentration:  Concentration: Fair  Recall:  [employer] of Knowledge:  Fair  Language:  Fair  Akathisia:  Negative  Handed:  Right  AIMS (if indicated):     Assets:  Physical Health Resilience Social Support  ADL's:  Intact  Cognition:  WNL  Sleep:  Number of Hours: 5     Treatment Plan Summary: Daily contact with patient to assess and evaluate symptoms and progress in treatment, Medication management and Plan We will administer the first dose of the Invega today continue current treatments and plans no change in other meds will probably discharge within 24 to 48 hours  Andrew Couse, MD 04/12/2018, 9:57  AM

## 2018-04-12 NOTE — Plan of Care (Signed)
Patient was pleasant upon approach this morning. Denies SI HI AVH. Denies any physical pain. Patient is compliant with most medications, except for long acting injectable. Writer has asked patient 4 times if he is ready for the injection, and patient proceeds to dodge and Environmental education officer. Safety is maintained with 15 minute checks as well as environmental checks. Will continue to monitor and assess throughout the shift.  Problem: Education: Goal: Mental status will improve Outcome: Not Progressing Goal: Verbalization of understanding the information provided will improve Outcome: Not Progressing   Problem: Activity: Goal: Interest or engagement in activities will improve Outcome: Not Progressing Goal: Sleeping patterns will improve Outcome: Not Progressing
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
## 2018-04-12 NOTE — Progress Notes (Signed)
Patient continues to be intrusive and irritable on the unit. Complaining of other patients talking in the dayroom, the social worker not calling his father (which he has), and needing to get a first class plane ticket to [place]. Patient has stopped just about every staff member in the hallway and asked to call his father or brother so he can get some proper shoes and clothes to fly first class. Patient was told multiple times by multiple people that we will not be contacting his father and he will not be leaving the hospital. Patient was offered PRN medications for anxiety and agitation. Patient responded ".......i'm not anxious."

## 2018-04-13 ENCOUNTER — Encounter (HOSPITAL_COMMUNITY): Payer: Self-pay | Admitting: Behavioral Health

## 2018-04-13 MED ORDER — RISPERIDONE 2 MG PO TABS
4.0000 mg | ORAL_TABLET | Freq: Every day | ORAL | Status: DC
  Filled 2018-04-13: qty 2

## 2018-04-13 MED ORDER — RISPERIDONE 4 MG PO TABS: 4.0000 mg | ORAL_TABLET | Freq: Every day | ORAL | 0 refills | Status: DC

## 2018-04-13 MED ORDER — BENZTROPINE MESYLATE 0.5 MG PO TABS: 0.5000 mg | ORAL_TABLET | Freq: Two times a day (BID) | ORAL | 0 refills | Status: DC

## 2018-04-13 MED ORDER — TEMAZEPAM 30 MG PO CAPS: 30.0000 mg | ORAL_CAPSULE | Freq: Every day | ORAL | 0 refills | Status: DC

## 2018-04-13 MED ORDER — RISPERIDONE 3 MG PO TABS: 3.0000 mg | ORAL_TABLET | Freq: Two times a day (BID) | ORAL | 0 refills | Status: DC

## 2018-04-13 MED ORDER — HYDROXYZINE HCL 50 MG PO TABS: 50.0000 mg | ORAL_TABLET | Freq: Three times a day (TID) | ORAL | 0 refills | Status: DC | PRN
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
## 2018-04-13 NOTE — Plan of Care (Signed)
Progress note  D: pt found in bed; pt would not respond to this writers assessment. Pt came out of his room after awakening with nothing but a towel around his waste. Pt was redirected to his room with new scrubs. Pt denies any physical pain or symptoms. Pt still seems paranoid. Pt denies si/hi/ah/vh and verbally agrees to approach staff if these become apparent or before harming himself/others while at [employer]  A: pt provided support and encouragement. Pt declined medication this morning. Q71m safety checks implemented and continued.  R: pt safe on the unit. Will continue to monitor.   Pt progressing in the following metrics  Problem: Coping: Goal: Ability to demonstrate self-control will improve Outcome: Progressing   Problem: Health Behavior/Discharge Planning: Goal: Compliance with treatment plan for underlying cause of condition will improve Outcome: Progressing   Problem: Physical Regulation: Goal: Ability to maintain clinical measurements within normal limits will improve Outcome: Progressing   Problem: Activity: Goal: Will verbalize the importance of balancing activity with adequate rest periods Outcome: Progressing

## 2018-04-13 NOTE — Progress Notes (Signed)
Recreation Therapy Notes  Date: 4.2.20 Time:  1000 Location: 500 Hall Dayroom  Group Topic: Communication, Team Building, Problem Solving  Goal Area(s) Addresses:  Patient will effectively work with peer towards shared goal.  Patient will identify skills used to make activity successful.  Patient will identify how skills used during activity can be used to reach post d/c goals.   Intervention: STEM Activity  Activity: Geophysicist/field seismologist. In teams patients were given 12 plastic drinking straws and a length of masking tape. Using the materials provided patients were asked to build a landing pad to catch a golf ball dropped from approximately 6 feet in the air.   Education: Education officer, community, Discharge Planning   Education Outcome: Acknowledges education/In group clarification offered/Needs additional education.   Clinical Observations/Feedback:  Pt did not attend group.    Victorino Sparrow, LRT/CTRS         Victorino Sparrow A 04/13/2018 11:23 AM
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
## 2018-04-13 NOTE — Progress Notes (Addendum)
CSW attempted to meet with pt to discuss discharge.  Pt was laying in bed with covers over his head, pt refused to take covers off his head or respond to CSW questions.  CSW asked pt where he is planning to go, with no response.  Pt asked if he wanted information about shelter options and received no response.  Pt did not respond to offer of stop smoking program.  CSW reminded pt that bus transportation is currently free. Winferd Humphrey, MSW, LCSW Clinical Social Worker 04/13/2018 9:34 AM   CSW returned call to pt brother, Nicole Kindred, who asked for update.  CSW informed him of pt discharge and we discussed that pt is stating he does not have a phone currently and all [facility] services are currently over the phone.  Brother may try to get pt a phone so he can connect with [facility] if he chooses to.  CSW gave the [place] number so they could contact [facility] with correct phone number for first phone call on Monday. Winferd Humphrey, MSW, LCSW Clinical Social Worker 04/13/2018 9:46 AM   CSW attempted to speak with pt again, asked him to sign ROI form.  Pt again laying in bed refusing to respond.  After several attempts, CSW left. Winferd Humphrey, MSW, LCSW Clinical Social Worker 04/13/2018 3:47 PM

## 2018-04-13 NOTE — BHH Suicide Risk Assessment (Signed)
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[place])   Bipolar disorder, most recent episode manic ([place])   Total Time spent with patient: 45 minutes  Alert oriented cooperative about a stable as he can be no auditory or visual hallucinations stable for release Mental Status Per Nursing Assessment::   On Admission:  NA  Demographic Factors:  Male  Loss Factors: NA  Historical Factors: NA  Risk Reduction Factors:   Religious beliefs about death  Continued Clinical Symptoms:  Schizophrenia:   Paranoid or undifferentiated type  Cognitive Features That Contribute To Risk:  Loss of executive function    Suicide Risk:  Minimal: No identifiable suicidal ideation.  Patients presenting with no risk factors but with morbid ruminations; may be classified as minimal risk based on the severity of the depressive symptoms  Follow-up Information    [facility] Follow up.   Why:  Telephonic hospital follow up appointment is Monday, 4/6 at 10:00a.  The provider will contact patient for appointment.  Contact information: [address] [place] [address] [phone]           Plan Of Care/Follow-up recommendations:  Activity:  full  Daffney Greenly, MD 04/13/2018, 9:24 AM

## 2018-04-13 NOTE — BHH Group Notes (Signed)
[facility] LCSW Group Therapy Note  Date/Time: 04/13/18, 1315  Type of Therapy/Topic:  Group Therapy:  Balance in Life  Participation Level:  Did not attend  Description of Group:    This group will address the concept of balance and how it feels and looks when one is unbalanced. Patients will be encouraged to process areas in their lives that are out of balance, and identify reasons for remaining unbalanced. Facilitators will guide patients utilizing problem- solving interventions to address and correct the stressor making their life unbalanced. Understanding and applying boundaries will be explored and addressed for obtaining  and maintaining a balanced life. Patients will be encouraged to explore ways to assertively make their unbalanced needs known to significant others in their lives, using other group members and facilitator for support and feedback.  Therapeutic Goals: 1. Patient will identify two or more emotions or situations they have that consume much of in their lives. 2. Patient will identify signs/triggers that life has become out of balance:  3. Patient will identify two ways to set boundaries in order to achieve balance in their lives:  4. Patient will demonstrate ability to communicate their needs through discussion and/or role plays  Summary of Patient Progress:          Therapeutic Modalities:   Cognitive Behavioral Therapy Solution-Focused Therapy Assertiveness Training  Lurline Idol, LCSW

## 2018-04-13 NOTE — Progress Notes (Signed)
  [facility] Adult Case Management Discharge Plan :  Will you be returning to the same living situation after discharge:  Yes,  pt continues to be homeless At discharge, do you have transportation home?: No. Bus: no bus fare currently.  Do you have the ability to pay for your medications: No. Will work with [employer].   Release of information consent forms completed and in the chart;  Patient's signature needed at discharge.  Patient to Follow up at: Follow-up Information    [facility] Follow up.   Why:  Telephonic hospital follow up appointment is Monday, 4/6 at 10:00a.  The provider will contact patient for appointment.  Contact information: [address] [place] [address] [phone]           Next level of care provider has access to [place] and Suicide Prevention discussed: Yes,  with brother     Has patient been referred to the Quitline?: Patient refused referral  Patient has been referred for addiction treatment: Pt. refused referral  [name], [place] 04/13/2018, 9:42 AM

## 2018-04-20 ENCOUNTER — Encounter (HOSPITAL_COMMUNITY): Payer: Self-pay | Admitting: *Deleted

## 2018-04-20 ENCOUNTER — Emergency Department (HOSPITAL_COMMUNITY)
Admission: EM | Admit: 2018-04-20 | Discharge: 2018-04-20 | Disposition: A | Discharge status: Home / Self Care | Payer: Medicaid Other | Attending: Emergency Medicine | Admitting: Emergency Medicine

## 2018-04-20 ENCOUNTER — Other Ambulatory Visit: Payer: Self-pay

## 2018-04-20 DIAGNOSIS — F319 Bipolar disorder, unspecified: Secondary | ICD-10-CM | POA: Insufficient documentation

## 2018-04-20 DIAGNOSIS — R45851 Suicidal ideations: Secondary | ICD-10-CM

## 2018-04-20 DIAGNOSIS — F17210 Nicotine dependence, cigarettes, uncomplicated: Secondary | ICD-10-CM | POA: Insufficient documentation

## 2018-04-20 DIAGNOSIS — Z8547 Personal history of malignant neoplasm of testis: Secondary | ICD-10-CM | POA: Insufficient documentation

## 2018-04-20 LAB — COMPREHENSIVE METABOLIC PANEL
ALT: 20 U/L (ref 0–44)
AST: 24 U/L (ref 15–41)
Albumin: 4.3 g/dL (ref 3.5–5.0)
Alkaline Phosphatase: 59 U/L (ref 38–126)
Anion gap: 9 (ref 5–15)
BUN: 18 mg/dL (ref 6–20)
CO2: 21 mmol/L — ABNORMAL LOW (ref 22–32)
Calcium: 9.4 mg/dL (ref 8.9–10.3)
Chloride: 105 mmol/L (ref 98–111)
Creatinine, Ser: 0.86 mg/dL (ref 0.61–1.24)
GFR calc Af Amer: >60 mL/min (ref >60)
GFR calc non Af Amer: >60 mL/min (ref >60)
Glucose, Bld: 94 mg/dL (ref 70–99)
Potassium: 3.5 mmol/L (ref 3.5–5.1)
Sodium: 135 mmol/L (ref 135–145)
Total Bilirubin: 1.2 mg/dL (ref 0.3–1.2)
Total Protein: 6.6 g/dL (ref 6.5–8.1)

## 2018-04-20 LAB — RAPID URINE DRUG SCREEN, HOSP PERFORMED
Amphetamines: NOT DETECTED
Barbiturates: NOT DETECTED
Benzodiazepines: NOT DETECTED
Cocaine: NOT DETECTED
Opiates: NOT DETECTED
Tetrahydrocannabinol: POSITIVE — AB

## 2018-04-20 LAB — CBC WITH DIFFERENTIAL/PLATELET
Abs Immature Granulocytes: 0.01 10*3/uL (ref 0.00–0.07)
Basophils Absolute: 0 10*3/uL (ref 0.0–0.1)
Basophils Relative: 0 %
Eosinophils Absolute: 0 10*3/uL (ref 0.0–0.5)
Eosinophils Relative: 1 %
HCT: 34.4 % — ABNORMAL LOW (ref 39.0–52.0)
Hemoglobin: 11.4 g/dL — ABNORMAL LOW (ref 13.0–17.0)
Immature Granulocytes: 0 %
Lymphocytes Relative: 23 %
Lymphs Abs: 1.3 10*3/uL (ref 0.7–4.0)
MCH: 32.1 pg (ref 26.0–34.0)
MCHC: 33.1 g/dL (ref 30.0–36.0)
MCV: 96.9 fL (ref 80.0–100.0)
Monocytes Absolute: 0.7 10*3/uL (ref 0.1–1.0)
Monocytes Relative: 12 %
Neutro Abs: 3.7 10*3/uL (ref 1.7–7.7)
Neutrophils Relative %: 64 %
Platelets: 203 10*3/uL (ref 150–400)
RBC: 3.55 MIL/uL — ABNORMAL LOW (ref 4.22–5.81)
RDW: 13.7 % (ref 11.5–15.5)
WBC: 5.7 10*3/uL (ref 4.0–10.5)
nRBC: 0 % (ref 0.0–0.2)

## 2018-04-20 LAB — ETHANOL: Alcohol, Ethyl (B): <10 mg/dL (ref <10)

## 2018-04-20 MED ORDER — TEMAZEPAM 15 MG PO CAPS: 30.0000 mg | ORAL_CAPSULE | Freq: Every day | ORAL | Status: DC

## 2018-04-20 MED ORDER — RISPERIDONE 2 MG PO TABS
4.0000 mg | ORAL_TABLET | Freq: Every day | ORAL | Status: DC
  Administered 2018-04-20: 4 mg via ORAL
  Filled 2018-04-20: qty 2

## 2018-04-20 MED ORDER — HYDROXYZINE HCL 25 MG PO TABS: 50.0000 mg | ORAL_TABLET | Freq: Three times a day (TID) | ORAL | Status: DC | PRN

## 2018-04-20 MED ORDER — BENZTROPINE MESYLATE 0.5 MG PO TABS
0.5000 mg | ORAL_TABLET | Freq: Two times a day (BID) | ORAL | Status: DC
  Administered 2018-04-20: 0.5 mg via ORAL
  Filled 2018-04-20: qty 1
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
## 2018-04-20 NOTE — ED Provider Notes (Addendum)
[facility] DEPT Provider Note   CSN: [id number] Arrival date & time: 04/20/18  2035    History   Chief Complaint Chief Complaint  Patient presents with  . Suicidal    HPI Andrew Chaney is a 63 y.o. male.     The history is provided by the patient and medical records. No language interpreter was used.   Andrew Chaney is a 63 y.o. male  with a PMH of bipolar disorder who presents to the Emergency Department complaining of suicidal thoughts.  Patient states that "I am going to kill myself, I swear I am" when asked why he is in the hospital today. He seems very fixated on cleaning the room and has cleaned the sink with soap / water / paper towel several times while I spoke with him. He denies any HI or auditory / visual hallucinations.  He states that he was seen at [facility] a couple of weeks ago where they committed him.  They told him they were going to help out, but he states they just sent him away the next day.   Past Medical History:  Diagnosis Date  . Bipolar 1 disorder ([place])   . 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 Renal disorder     Patient Active Problem List   Diagnosis Date Noted  . Bipolar disorder, most recent episode manic ([place]) 04/11/2018  . Bipolar I disorder, current or most recent episode manic, with psychotic features ([place]) 04/08/2018  . Confusion 01/03/2015  . Chronic hepatitis C ([place]) 12/20/2012  . Renal failure 12/17/2012  . Acute encephalopathy 12/17/2012  . ARF (acute renal failure) ([place]) 12/17/2012  . Hyponatremia 12/17/2012  . CAP (community acquired pneumonia) 12/17/2012  . Rhabdomyolysis 12/17/2012  . 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 No pertinent surgical history.      Home Medications    Prior to Admission medications   Medication Sig Start Date End Date Taking? Authorizing Provider  benztropine (COGENTIN) 0.5 MG tablet Take 1 tablet (0.5 mg total) by mouth  2 (two) times daily. 04/13/18   Mordecai Maes, NP  hydrOXYzine (ATARAX/VISTARIL) 50 MG tablet Take 1 tablet (50 mg total) by mouth 3 (three) times daily as needed for anxiety. 04/13/18   Mordecai Maes, NP  risperiDONE (RISPERDAL) 4 MG tablet Take 1 tablet (4 mg total) by mouth at bedtime. 04/14/18   Mordecai Maes, NP  temazepam (RESTORIL) 30 MG capsule Take 1 capsule (30 mg total) by mouth at bedtime. 04/13/18   Mordecai Maes, NP    Family History No family history on file.  Social History Social History   Tobacco Use  . Smoking status: Current Every Day Smoker    Types: Cigarettes  . Smokeless tobacco: Never Used  Substance Use Topics  . Alcohol use: Yes  . Drug use: Yes    Types: Marijuana    Comment: States no longer uses marijuana because he is "on probation"     Allergies   Patient has no known allergies.   Review of Systems Review of Systems  Psychiatric/Behavioral: Positive for suicidal ideas.  All other systems reviewed and are negative.    Physical Exam Updated Vital Signs BP (!) 147/95 (BP Location: Left Arm)   Pulse 79   Temp 98.3 F (36.8 C) (Oral)   Resp 16   Ht 5\' 7"  (1.702 m)   Wt 75 kg   SpO2 99%   BMI 25.90 kg/m  Physical Exam Vitals signs and nursing note reviewed.  Constitutional:      General: He is not in acute distress.    Appearance: He is well-developed.  HENT:     Head: Normocephalic and atraumatic.  Neck:     Musculoskeletal: Neck supple.  Cardiovascular:     Rate and Rhythm: Normal rate and regular rhythm.     Heart sounds: Normal heart sounds. No murmur.  Pulmonary:     Effort: Pulmonary effort is normal. No respiratory distress.     Breath sounds: Normal breath sounds.  Abdominal:     General: There is no distension.     Palpations: Abdomen is soft.     Tenderness: There is no abdominal tenderness.  Skin:    General: Skin is warm and dry.  Neurological:     Mental Status: He is alert and oriented to person, place,  and time.  Psychiatric:        Behavior: Behavior is hyperactive.        Thought Content: Thought content includes suicidal ideation.      ED Treatments / Results  Labs (all labs ordered are listed, but only abnormal results are displayed) Labs Reviewed  CBC WITH DIFFERENTIAL/PLATELET - Abnormal; Notable for the following components:      Result Value   RBC 3.55 (*)    Hemoglobin 11.4 (*)    HCT 34.4 (*)    All other components within normal limits  COMPREHENSIVE METABOLIC PANEL - Abnormal; Notable for the following components:   CO2 21 (*)    All other components within normal limits  RAPID URINE DRUG SCREEN, HOSP PERFORMED - Abnormal; Notable for the following components:   Tetrahydrocannabinol POSITIVE (*)    All other components within normal limits  ETHANOL    EKG None  Radiology No results found.  Procedures Procedures (including critical care time)  Medications Ordered in ED Medications  benztropine (COGENTIN) tablet 0.5 mg (0.5 mg Oral Given 04/20/18 2216)  hydrOXYzine (ATARAX/VISTARIL) tablet 50 mg (has no administration in time range)  risperiDONE (RISPERDAL) tablet 4 mg (4 mg Oral Given 04/20/18 2217)  temazepam (RESTORIL) capsule 30 mg (30 mg Oral Refused 04/20/18 2218)     Initial Impression / Assessment and Plan / ED Course  I have reviewed the triage vital signs and the nursing notes.  Pertinent labs & imaging results that were available during my care of the patient were reviewed by me and considered in my medical decision making (see chart for details).       Andrew Chaney is a 63 y.o. male who presents to ED for suicidal thoughts. Seen by TTS who have psychiatrically cleared him. Labs reviewed and reassuring. Medically cleared as well. Discharged home with resources and outpatient follow up.    Final Clinical Impressions(s) / ED Diagnoses   Final diagnoses:  Suicidal thoughts    ED Discharge Orders    None         Mertha Clyatt, Ozella Almond,  PA-C 04/20/18 [place], DO 04/24/18 1504

## 2018-04-20 NOTE — ED Notes (Signed)
Bed: [facility] Expected date:  Expected time:  Means of arrival:  Comments: 

## 2018-04-20 NOTE — ED Notes (Signed)
Pt walking around in triage area with multiple request then states he never said he wanted anything.

## 2018-04-20 NOTE — ED Notes (Signed)
Pt has been wanded, belongings in one personal bag at desk.
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
## 2018-04-20 NOTE — Discharge Instructions (Addendum)
Follow up with [facility] for your behavioral health needs.   Return to ER for new or worsening symptoms, any additional concerns.

## 2018-04-20 NOTE — Progress Notes (Signed)
TTS ordered for pt who is in waiting room. TTS cannot complete assessment until the pt is in a private room.  Lind Covert, MSW, LCSW Therapeutic Triage Specialist  [phone]

## 2018-04-21 ENCOUNTER — Emergency Department (HOSPITAL_COMMUNITY)
Admission: EM | Admit: 2018-04-21 | Discharge: 2018-04-21 | Disposition: A | Discharge status: Home / Self Care | Payer: Medicaid Other | Attending: Emergency Medicine | Admitting: Emergency Medicine

## 2018-04-21 ENCOUNTER — Encounter (HOSPITAL_COMMUNITY): Payer: Self-pay | Admitting: Emergency Medicine

## 2018-04-21 ENCOUNTER — Other Ambulatory Visit: Payer: Self-pay

## 2018-04-21 DIAGNOSIS — F17200 Nicotine dependence, unspecified, uncomplicated: Secondary | ICD-10-CM | POA: Insufficient documentation

## 2018-04-21 DIAGNOSIS — R45851 Suicidal ideations: Secondary | ICD-10-CM | POA: Insufficient documentation

## 2018-04-21 DIAGNOSIS — F310 Bipolar disorder, current episode hypomanic: Secondary | ICD-10-CM | POA: Insufficient documentation

## 2018-04-21 DIAGNOSIS — Z8659 Personal history of other mental and behavioral disorders: Secondary | ICD-10-CM | POA: Insufficient documentation

## 2018-04-21 DIAGNOSIS — F319 Bipolar disorder, unspecified: Secondary | ICD-10-CM | POA: Insufficient documentation

## 2018-04-21 DIAGNOSIS — F32A Depression, unspecified: Secondary | ICD-10-CM

## 2018-04-21 DIAGNOSIS — Z85828 Personal history of other malignant neoplasm of skin: Secondary | ICD-10-CM | POA: Insufficient documentation

## 2018-04-21 DIAGNOSIS — Z5900 Homelessness unspecified: Secondary | ICD-10-CM

## 2018-04-21 DIAGNOSIS — Z79899 Other long term (current) drug therapy: Secondary | ICD-10-CM | POA: Insufficient documentation

## 2018-04-21 DIAGNOSIS — G4700 Insomnia, unspecified: Secondary | ICD-10-CM | POA: Insufficient documentation

## 2018-04-21 DIAGNOSIS — F17210 Nicotine dependence, cigarettes, uncomplicated: Secondary | ICD-10-CM | POA: Insufficient documentation

## 2018-04-21 DIAGNOSIS — B182 Chronic viral hepatitis C: Secondary | ICD-10-CM | POA: Insufficient documentation

## 2018-04-21 DIAGNOSIS — F329 Major depressive disorder, single episode, unspecified: Secondary | ICD-10-CM

## 2018-04-21 LAB — COMPREHENSIVE METABOLIC PANEL
ALT: 19 U/L (ref 0–44)
AST: 24 U/L (ref 15–41)
Albumin: 3.5 g/dL (ref 3.5–5.0)
Alkaline Phosphatase: 53 U/L (ref 38–126)
Anion gap: 8 (ref 5–15)
BUN: 10 mg/dL (ref 6–20)
CO2: 25 mmol/L (ref 22–32)
Calcium: 9.4 mg/dL (ref 8.9–10.3)
Chloride: 104 mmol/L (ref 98–111)
Creatinine, Ser: 0.76 mg/dL (ref 0.61–1.24)
GFR calc Af Amer: >60 mL/min (ref >60)
GFR calc non Af Amer: >60 mL/min (ref >60)
Glucose, Bld: 106 mg/dL — ABNORMAL HIGH (ref 70–99)
Potassium: 3.6 mmol/L (ref 3.5–5.1)
Sodium: 137 mmol/L (ref 135–145)
Total Bilirubin: 1.5 mg/dL — ABNORMAL HIGH (ref 0.3–1.2)
Total Protein: 5.5 g/dL — ABNORMAL LOW (ref 6.5–8.1)

## 2018-04-21 LAB — CBC
HCT: 34.4 % — ABNORMAL LOW (ref 39.0–52.0)
Hemoglobin: 11.4 g/dL — ABNORMAL LOW (ref 13.0–17.0)
MCH: 31.8 pg (ref 26.0–34.0)
MCHC: 33.1 g/dL (ref 30.0–36.0)
MCV: 96.1 fL (ref 80.0–100.0)
Platelets: 197 10*3/uL (ref 150–400)
RBC: 3.58 MIL/uL — ABNORMAL LOW (ref 4.22–5.81)
RDW: 13.4 % (ref 11.5–15.5)
WBC: 3.7 10*3/uL — ABNORMAL LOW (ref 4.0–10.5)
nRBC: 0 % (ref 0.0–0.2)

## 2018-04-21 LAB — RAPID URINE DRUG SCREEN, HOSP PERFORMED
Amphetamines: NOT DETECTED
Barbiturates: NOT DETECTED
Benzodiazepines: NOT DETECTED
Cocaine: NOT DETECTED
Opiates: NOT DETECTED
Tetrahydrocannabinol: POSITIVE — AB

## 2018-04-21 LAB — ACETAMINOPHEN LEVEL: Acetaminophen (Tylenol), Serum: <10 ug/mL — ABNORMAL LOW (ref 10–30)

## 2018-04-21 LAB — SALICYLATE LEVEL: Salicylate Lvl: <7 mg/dL (ref 2.8–30.0)

## 2018-04-21 LAB — ETHANOL: Alcohol, Ethyl (B): <10 mg/dL (ref <10)

## 2018-04-21 MED ORDER — TEMAZEPAM 15 MG PO CAPS: 30.0000 mg | ORAL_CAPSULE | Freq: Every day | ORAL | Status: DC

## 2018-04-21 MED ORDER — HYDROXYZINE HCL 25 MG PO TABS
50.0000 mg | ORAL_TABLET | Freq: Three times a day (TID) | ORAL | Status: DC
  Filled 2018-04-21: qty 2

## 2018-04-21 MED ORDER — BENZTROPINE MESYLATE 1 MG PO TABS
0.5000 mg | ORAL_TABLET | Freq: Two times a day (BID) | ORAL | Status: DC
  Filled 2018-04-21: qty 1

## 2018-04-21 MED ORDER — RISPERIDONE 3 MG PO TABS: 4.0000 mg | ORAL_TABLET | Freq: Every day | ORAL | Status: DC

## 2018-04-21 NOTE — ED Triage Notes (Signed)
Pt presents with suicidal thoughts. Pt denies any plan. Pt is very uncooperative and will only answer limited questions.

## 2018-04-21 NOTE — Consult Note (Signed)
Telepsych Consultation   Reason for Consult:  SI Referring Physician:  EDP Location of Patient:  Location of Provider: [place] Department  Patient Identification: Andrew Chaney MRN:  [id number] Principal Diagnosis: <principal problem not specified> Diagnosis:  Active Problems:   * No active hospital problems. *   Total Time spent with patient: 30 minutes  Subjective:   Andrew Chaney is a 63 y.o. male patient reports today that he came to the hospital because he stated he was going to kill himself.  When asked why he states that he has been suffering more than anyone else's had to when asked what his suffering was he said he would give me an example and he stated that he did not get enough salt for his lunch and that was too much suffering for him.  He states that he does not take any psychotropic drugs and he does not need them.  He states that he only needs to be somewhere where he can have a safe place to rest and can get the food that he wants.  He states he wants to go back to the place that had the doors with Velcro on it that swing like saloon doors.  He stated that there he was able to have them give him close, all the food he wanted, and he could sleep as much as he wanted to.  He was asked about going to the sports complex and he stated that that was not a nice place to go and he thought it was will be a quiet and secure place and he did not want the food that they had there and he did not like the sleeping arrangements.  He states that he was discharged from the hospital last week and then went to jail for a week and then was right back to [facility] emergency department was discharged from there and then walked into [facility] emergency department with the same complaints.  The patient denies any homicidal ideations and denies any hallucinations.  Patient does not have a plan or intent for his suicidal comments and states that he just needs to have close, sleeping bag,  food, and a place to sleep.  HPI:  63 y.o. male who voluntarily presents to [facility] with suicidal ideation. Pt has a history of Bipolar d/o. Pt relays firm opposition to psychiatric medication. Pt reports current suicidal ideation without plan. He denies previous suicide attempts and repeatedly stated he would kill himself if he doesn't get help. Pt reports no sleep in past week. He states it is not due to homelessness. Pt states that while he doesn't want to take medications, he would like a shot that would help him sleep for about 4 days. Pt states that is all he would need to help him be well. Pt denies symptoms of Depression with exception of insomnia. Pt denies homicidal ideation/ history of violence. Pt denies AVH & other psychotic symptoms.   Past Psychiatric History: Multiple ED visits, multiple hospitalizations, MDD  Risk to Self: Suicidal Ideation: Yes-Currently Present Suicidal Intent: No Is patient at risk for suicide?: Yes Suicidal Plan?: No Access to Means: Yes What has been your use of drugs/alcohol within the last 12 months?: yes(beer & THC- states he doesn't have a problem) How many times?: 0 Other Self Harm Risks: lives alone, homeless, biploar dx Intentional Self Injurious Behavior: None Risk to Others: Homicidal Ideation: No Thoughts of Harm to Others: No Current Homicidal Intent: No Current Homicidal Plan: No  Access to Homicidal Means: No History of harm to others?: No Assessment of Violence: None Noted Does patient have access to weapons?: No Criminal Charges Pending?: No Does patient have a court date: No Prior Inpatient Therapy: Prior Inpatient Therapy: Yes Prior Therapy Dates: March 2020 Prior Therapy Facilty/Provider(s): [facility] Reason for Treatment: Bipolar Prior Outpatient Therapy: Prior Outpatient Therapy: No Does patient have an ACCT team?: No Does patient have Intensive In-House Services?  : No Does patient have [facility] services? : No Does patient have  P4CC services?: No  Past Medical History:  Past Medical History:  Diagnosis Date  . Bipolar 1 disorder ([place])   . 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 Renal disorder    History reviewed. No pertinent surgical history. Family History: No family history on file. Family Psychiatric  History: Denied Social History:  Social History   Substance and Sexual Activity  Alcohol Use Yes     Social History   Substance and Sexual Activity  Drug Use Yes  . Types: Marijuana   Comment: States no longer uses marijuana because he is "on probation"    Social History   Socioeconomic History  . Marital status: Single    Spouse name: Not on file  . Number of children: Not on file  . Years of education: Not on file  . Highest education level: Not on file  Occupational History  . Not on file  Social Needs  . Financial resource strain: Not on file  . Food insecurity:    Worry: Not on file    Inability: Not on file  . Transportation needs:    Medical: Not on file    Non-medical: Not on file  Tobacco Use  . Smoking status: Current Every Day Smoker    Types: Cigarettes  . Smokeless tobacco: Never Used  Substance and Sexual Activity  . Alcohol use: Yes  . Drug use: Yes    Types: Marijuana    Comment: States no longer uses marijuana because he is "on probation"  . Sexual activity: Not Currently  Lifestyle  . Physical activity:    Days per week: Not on file    Minutes per session: Not on file  . Stress: Not on file  Relationships  . Social connections:    Talks on phone: Not on file    Gets together: Not on file    Attends religious service: Not on file    Active member of club or organization: Not on file    Attends meetings of clubs or organizations: Not on file    Relationship status: Not on file  Other Topics Concern  . Not on file  Social History Narrative  . Not on file   Additional Social History:    Allergies:  No Known Allergies  Labs:  Results for orders  placed or performed during the hospital encounter of 04/21/18 (from the past 48 hour(s))  Rapid urine drug screen (hospital performed)     Status: Abnormal   Collection Time: 04/21/18  8:00 AM  Result Value Ref Range   Opiates NONE DETECTED NONE DETECTED   Cocaine NONE DETECTED NONE DETECTED   Benzodiazepines NONE DETECTED NONE DETECTED   Amphetamines NONE DETECTED NONE DETECTED   Tetrahydrocannabinol POSITIVE (A) NONE DETECTED   Barbiturates NONE DETECTED NONE DETECTED    Comment: (NOTE) DRUG SCREEN FOR MEDICAL PURPOSES ONLY.  IF CONFIRMATION IS NEEDED FOR ANY PURPOSE, NOTIFY LAB WITHIN 5 DAYS. LOWEST DETECTABLE LIMITS FOR URINE  DRUG SCREEN Drug Class                     Cutoff (ng/mL) Amphetamine and metabolites    1000 Barbiturate and metabolites    200 Benzodiazepine                 308 Tricyclics and metabolites     300 Opiates and metabolites        300 Cocaine and metabolites        300 THC                            50 Performed at [facility], [place] [address], [place], [place] [address]   Comprehensive metabolic panel     Status: Abnormal   Collection Time: 04/21/18  9:09 AM  Result Value Ref Range   Sodium 137 135 - 145 mmol/L   Potassium 3.6 3.5 - 5.1 mmol/L   Chloride 104 98 - 111 mmol/L   CO2 25 22 - 32 mmol/L   Glucose, Bld 106 (H) 70 - 99 mg/dL   BUN 10 6 - 20 mg/dL   Creatinine, Ser 0.76 0.61 - 1.24 mg/dL   Calcium 9.4 8.9 - 10.3 mg/dL   Total Protein 5.5 (L) 6.5 - 8.1 g/dL   Albumin 3.5 3.5 - 5.0 g/dL   AST 24 15 - 41 U/L   ALT 19 0 - 44 U/L   Alkaline Phosphatase 53 38 - 126 U/L   Total Bilirubin 1.5 (H) 0.3 - 1.2 mg/dL   GFR calc non Af Amer >60 >60 mL/min   GFR calc Af Amer >60 >60 mL/min   Anion gap 8 5 - 15    Comment: Performed at [facility] [address], [place], [place] [address]  Ethanol     Status: None   Collection Time: 04/21/18  9:09 AM  Result Value Ref Range   Alcohol, Ethyl (B) <10 <10 mg/dL    Comment:  (NOTE) Lowest detectable limit for serum alcohol is 10 mg/dL. For medical purposes only. Performed at [facility], [place] [address], [place], [place] [address]   Salicylate level     Status: None   Collection Time: 04/21/18  9:09 AM  Result Value Ref [place]   Salicylate Lvl <3.2 2.8 - 30.0 mg/dL    Comment: Performed at [facility] [address] [place], [place] [address]  Acetaminophen level     Status: Abnormal   Collection Time: 04/21/18  9:09 AM  Result Value Ref Range   Acetaminophen (Tylenol), Serum <10 (L) 10 - 30 ug/mL    Comment: (NOTE) Therapeutic concentrations vary significantly. A range of 10-30 ug/mL  may be an effective concentration for many patients. However, some  are best treated at concentrations outside of this range. Acetaminophen concentrations >150 ug/mL at 4 hours after ingestion  and >50 ug/mL at 12 hours after ingestion are often associated with  toxic reactions. Performed at [facility], [place] [address] [place], [place] [address]   cbc     Status: Abnormal   Collection Time: 04/21/18  9:09 AM  Result Value Ref Range   WBC 3.7 (L) 4.0 - 10.5 K/uL   RBC 3.58 (L) 4.22 - 5.81 MIL/uL   Hemoglobin 11.4 (L) 13.0 - 17.0 g/dL   HCT 34.4 (L) 39.0 - 52.0 %   MCV 96.1 80.0 - 100.0 fL  MCH 31.8 26.0 - 34.0 pg   MCHC 33.1 30.0 - 36.0 g/dL   RDW 13.4 11.5 - 15.5 %   Platelets 197 150 - 400 K/uL   nRBC 0.0 0.0 - 0.2 %    Comment: Performed at [facility], [place] [address], [place], [place] [address]    Medications:  Current Facility-Administered Medications  Medication Dose Route Frequency Provider Last Rate Last Dose  . benztropine (COGENTIN) tablet 0.5 mg  0.5 mg Oral BID Jerilee Hoh, Ana P, PA-C      . hydrOXYzine (ATARAX/VISTARIL) tablet 50 mg  50 mg Oral TID Jerilee Hoh, Ana P, PA-C      . risperiDONE (RISPERDAL) tablet 4 mg  4 mg Oral QHS Hernandez, Ana P, PA-C      . temazepam (RESTORIL) capsule 30 mg  30 mg Oral QHS Hernandez,  Ana P, PA-C       Current Outpatient Medications  Medication Sig Dispense Refill  . hydrOXYzine (ATARAX/VISTARIL) 50 MG tablet Take 1 tablet (50 mg total) by mouth 3 (three) times daily as needed for anxiety. 30 tablet 0  . paliperidone (INVEGA SUSTENNA) 234 MG/1.5ML SUSY injection Inject 234 mg into the muscle once.    . risperiDONE (RISPERDAL) 4 MG tablet Take 1 tablet (4 mg total) by mouth at bedtime. 30 tablet 0  . temazepam (RESTORIL) 30 MG capsule Take 1 capsule (30 mg total) by mouth at bedtime. 30 capsule 0  . benztropine (COGENTIN) 0.5 MG tablet Take 1 tablet (0.5 mg total) by mouth 2 (two) times daily. 60 tablet 0    Musculoskeletal: Strength & Muscle Tone: within normal limits Gait & Station: normal Patient leans: N/A  Psychiatric Specialty Exam: Physical Exam  Nursing note and vitals reviewed. Constitutional: He is oriented to person, place, and time. He appears well-developed and well-nourished.  Respiratory: Effort normal.  Musculoskeletal: Normal range of motion.  Neurological: He is alert and oriented to person, place, and time.  Skin: Skin is warm.    Review of Systems  Constitutional: Negative.   HENT: Negative.   Eyes: Negative.   Respiratory: Negative.   Cardiovascular: Negative.   Gastrointestinal: Negative.   Genitourinary: Negative.   Musculoskeletal: Negative.   Skin: Negative.   Neurological: Negative.   Endo/Heme/Allergies: Negative.   Psychiatric/Behavioral: Positive for suicidal ideas (Chronic suicidality).    Blood pressure (!) 122/91, pulse (!) 102, temperature 98.5 F (36.9 C), temperature source Oral, resp. rate 18, height 5\' 7"  (1.702 m), weight 75 kg, SpO2 100 %.Body mass index is 25.9 kg/m.  General Appearance: Disheveled  Eye Contact:  Fair  Speech:  Clear and Coherent and Normal Rate  Volume:  Increased  Mood:  Depressed  Affect:  Congruent  Thought Process:  Coherent and Descriptions of Associations: Intact  Orientation:  Full  (Time, Place, and Person)  Thought Content:  WDL  Suicidal Thoughts:  Reports SI chronically demanding food and shelter  Homicidal Thoughts:  No  Memory:  Immediate;   Good Recent;   Good Remote;   Good  Judgement:  Fair  Insight:  Good  Psychomotor Activity:  Normal  Concentration:  Concentration: Good and Attention Span: Good  Recall:  Good  Fund of Knowledge:  Good  Language:  Good  Akathisia:  No  Handed:  Right  AIMS (if indicated):     Assets:  Communication Skills Desire for Improvement Resilience  ADL's:  Intact  Cognition:  WNL  Sleep:        Treatment Plan  Summary: Follow up with outpatient treatment  Use local shelter resources   Disposition: No evidence of imminent risk to self or others at present.   Patient does not meet criteria for psychiatric inpatient admission. Supportive therapy provided about ongoing stressors. Discussed crisis plan, support from social network, calling 911, coming to the Emergency Department, and calling Suicide Hotline. At this time patient does not meet inpatient criteria and is psychiatrically cleared.  I have contacted Dr. Ralene Bathe and notified her of the recommendations.  This service was provided via telemedicine using a 2-way, interactive audio and video technology.  Names of all persons participating in this telemedicine service and their role in this encounter. Name: [name] Role: Patient  Name: Marvia Pickles NP Role: Provider  Name:  Role:   Name:  Role:     Lewis Shock, FNP 04/21/2018 2:18 PM

## 2018-04-21 NOTE — ED Notes (Signed)
Pt states "Am I going to get any food or what, I'm starving, I need to go back to the place where I was before where they had good food. I just need to stay here one night, or maybe everyone can put some money together and get me a hotel room, I don't care either way, but I am not leaving I can promise you that. I'll stay right in this room, I'll stay right in the lobby but I am not leaving"  Pt given sandwich bag, crackers, and a drink.  After being given coke pt states, "do you have any dr.pepper?" Pt informed that we do not, pt states "bleh that's gross". Pt proceeds to drink the coke. Pt continues to demand more crackers immediately after being provided with a meal and several snacks.

## 2018-04-21 NOTE — ED Notes (Signed)
Pt refused discharge paperwork but placed in property bag. Refused to sign e-signature pad. Escorted out by police and security
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
## 2018-04-21 NOTE — ED Notes (Signed)
Pt refusing to take paperwork, refusing exit VS, refusing to sign paperwork, states he will just come right back to the ED.

## 2018-04-21 NOTE — ED Provider Notes (Signed)
[facility] EMERGENCY DEPARTMENT Provider Note   CSN: [id number] Arrival date & time: 04/21/18  2050    History   Chief Complaint Chief Complaint  Patient presents with  . Suicidal    HPI Andrew Chaney is a 63 y.o. male.     HPI Pt presents to the ER reporting he wants to "check out".  Reports he is homeless and has no where to go and does not want to go to a homeless shelter. No suicidal plans. Pt seen in the ER earlier today and evaluated by the psychiatry team and stable for discharge earlier today. Reports he never left the ER   Past Medical History:  Diagnosis Date  . Bipolar 1 disorder ([place])   . 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 Renal disorder     Patient Active Problem List   Diagnosis Date Noted  . Bipolar disorder, most recent episode manic ([place]) 04/11/2018  . Bipolar I disorder, current or most recent episode manic, with psychotic features ([place]) 04/08/2018  . Confusion 01/03/2015  . Chronic hepatitis C ([place]) 12/20/2012  . Renal failure 12/17/2012  . Acute encephalopathy 12/17/2012  . ARF (acute renal failure) ([place]) 12/17/2012  . Hyponatremia 12/17/2012  . CAP (community acquired pneumonia) 12/17/2012  . Rhabdomyolysis 12/17/2012  . 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 No pertinent surgical history.      Home Medications    Prior to Admission medications   Medication Sig Start Date End Date Taking? Authorizing Provider  benztropine (COGENTIN) 0.5 MG tablet Take 1 tablet (0.5 mg total) by mouth 2 (two) times daily. 04/13/18   Mordecai Maes, NP  hydrOXYzine (ATARAX/VISTARIL) 50 MG tablet Take 1 tablet (50 mg total) by mouth 3 (three) times daily as needed for anxiety. 04/13/18   Mordecai Maes, NP  paliperidone (INVEGA SUSTENNA) 234 MG/1.5ML SUSY injection Inject 234 mg into the muscle once.    [provider]  risperiDONE (RISPERDAL) 4 MG tablet Take 1 tablet (4 mg total) by  mouth at bedtime. 04/14/18   Mordecai Maes, NP  temazepam (RESTORIL) 30 MG capsule Take 1 capsule (30 mg total) by mouth at bedtime. 04/13/18   Mordecai Maes, NP    Family History No family history on file.  Social History Social History   Tobacco Use  . Smoking status: Current Every Day Smoker    Types: Cigarettes  . Smokeless tobacco: Never Used  Substance Use Topics  . Alcohol use: Yes  . Drug use: Yes    Types: Marijuana    Comment: States no longer uses marijuana because he is "on probation"     Allergies   Patient has no known allergies.   Review of Systems Review of Systems  All other systems reviewed and are negative.    Physical Exam Updated Vital Signs BP 119/78 (BP Location: Left Arm)   Pulse 74   Temp 97.7 F (36.5 C) (Oral)   Resp 18   SpO2 98%   Physical Exam Vitals signs and nursing note reviewed.  Constitutional:      Appearance: He is well-developed.  HENT:     Head: Normocephalic.  Neck:     Musculoskeletal: Normal range of motion.  Pulmonary:     Effort: Pulmonary effort is normal.  Abdominal:     General: There is no distension.  Musculoskeletal: Normal range of motion.  Neurological:     Mental Status: He is alert and oriented to  person, place, and time.  Psychiatric:        Mood and Affect: Mood normal.        Behavior: Behavior normal.        Thought Content: Thought content normal.      ED Treatments / Results  Labs (all labs ordered are listed, but only abnormal results are displayed) Labs Reviewed - No data to display  EKG None  Radiology No results found.  Procedures Procedures (including critical care time)  Medications Ordered in ED Medications - No data to display   Initial Impression / Assessment and Plan / ED Course  I have reviewed the triage vital signs and the nursing notes.  Pertinent labs & imaging results that were available during my care of the patient were reviewed by me and considered in  my medical decision making (see chart for details).        Homeless. Does not appear to be a threat to himself or others at this time. Seen and evaluated by [facility]. Stable for discharge. Information for homeless shelters given. Food provided to the patient  Final Clinical Impressions(s) / ED Diagnoses   Final diagnoses:  Depression, unspecified depression type  [facility]    ED Discharge Orders    None       Jola Schmidt, MD 04/21/18 2333

## 2018-04-21 NOTE — Discharge Instructions (Signed)
You have been seen today for suicidal thoughts. Please read and follow all provided instructions.   1. Medications: usual home medications 2. Treatment: rest, drink plenty of fluids 3. Follow Up: Please follow up with your primary doctor in 2-7 days for discussion of your diagnoses and further evaluation after today's visit; if you do not have a primary care doctor use the resource guide provided to find one; Please return to the ER for any new or worsening symptoms. Please obtain all of your results from medical records or have your doctors office obtain the results - share them with your doctor - you should be seen at your doctors office. Call today to arrange your follow up.   Take medications as prescribed. Please review all of the medicines and only take them if you do not have an allergy to them. Return to the emergency room for worsening condition or new concerning symptoms. Follow up with your regular doctor. If you don't have a regular doctor use one of the numbers below to establish a primary care doctor.  Please be aware that if you are taking birth control pills, taking other prescriptions, ESPECIALLY ANTIBIOTICS may make the birth control ineffective - if this is the case, either do not engage in sexual activity or use alternative methods of birth control such as condoms until you have finished the medicine and your family doctor says it is OK to restart them. If you are on a blood thinner such as COUMADIN, be aware that any other medicine that you take may cause the coumadin to either work too much, or not enough - you should have your coumadin level rechecked in next 7 days if this is the case.  ?  It is also a possibility that you have an allergic reaction to any of the medicines that you have been prescribed - Everybody reacts differently to medications and while MOST people have no trouble with most medicines, you may have a reaction such as nausea, vomiting, rash, swelling, shortness of  breath. If this is the case, please stop taking the medicine immediately and contact your physician.  ?  You should return to the ER if you develop severe or worsening symptoms.   Emergency Department Resource Guide 1) Find a Doctor and Pay Out of Pocket Although you won't have to find out who is covered by your insurance plan, it is a good idea to ask around and get recommendations. You will then need to call the office and see if the doctor you have chosen will accept you as a new patient and what types of options they offer for patients who are self-pay. Some doctors offer discounts or will set up payment plans for their patients who do not have insurance, but you will need to ask so you aren't surprised when you get to your appointment.  2) Contact Your Local Health Department Not all health departments have doctors that can see patients for sick visits, but many do, so it is worth a call to see if yours does. If you don't know where your local health department is, you can check in your phone book. The CDC also has a tool to help you locate your state's health department, and many state websites also have listings of all of their local health departments.  3) Find a [facility] If your illness is not likely to be very severe or complicated, you may want to try a walk in clinic. These are popping up all over  the country in pharmacies, drugstores, and shopping centers. They're usually staffed by nurse practitioners or physician assistants that have been trained to treat common illnesses and complaints. They're usually fairly quick and inexpensive. However, if you have serious medical issues or chronic medical problems, these are probably not your best option. ° °No Primary Care Doctor: °Call [facility] at  832-8000 - they can help you locate a primary care doctor that  accepts your insurance, provides certain services, etc. °Physician Referral Service- 1-800-533-3463 ° °Chronic Pain  Problems: °Organization         Address  Phone   Notes  °[facility]  ([phone]) [phone] Patients need to be referred by their primary care doctor.  ° °Medication Assistance: °Organization         Address  Phone   Notes  °[place] [facility] [address] °[place], [place] [address] [phone]) [phone] --Must be a resident of [place] °-- Must have NO insurance coverage whatsoever (no Medicaid/ Medicare, etc.) °-- The pt. MUST have a primary care doctor that directs their care regularly and follows them in the community °  °[facility]  (866) 331-1348   °[employer]  (888) 892-1162   ° °Agencies that provide inexpensive medical care: °Organization         Address  Phone   Notes  °[facility]  ([phone]) [phone]   °[facility]    ([phone]) 832-7272   °[facility] [address] °[place], [place] [address] ([phone]) [phone]   °[facility] [address], °[place] (336) [phone]   °[facility]    (336) 373-0678   °[facility]    (336) 272-1050   °[facility] ° [address], [place] Phone:  (336) [phone], Fax:  ([phone]) [phone] Hours of Operation:  9 am - 6 pm, M-F.  Also accepts Medicaid/Medicare and self-pay.  °[facility] ° [address], [place] Phone: (336) [phone], Fax: ([phone]) [phone]. Hours of Operation:  8:30 am - 5:30 pm, M-F.  Also accepts Medicaid and self-pay.  °[facility] [place] [address], [place] Phone: (336) [phone]   °[facility] [address], [place], [place] (336)[phone], Ext. 123 Mondays & Thursdays: 7-9 AM.  First 15 patients are seen on a first come, first serve basis. °  ° °Medicaid-accepting [place] Providers: ° °Organization         Address  Phone   Notes  °[facility] [address], [place] ([phone]) [phone] Also  accepts self-pay patients.  °[facility] [address], [place] ° (336) [phone]   °[facility] [address], [place] (336) [phone]   °[facility] [address] [place] [address], [place] (336) [phone]   °[facility] [address], [place]  ° ([phone]) [phone] Only accepts [place] Access Medicaid patients after they have their name applied to their card.  ° °Self-Pay (no insurance) in [place]: ° °Organization         Address  Phone   Notes  °Sickle Cell Patients, [facility] [address], [place] ([phone]) [phone]   °[facility] [address], [place] ([phone]) [phone]   ° [facility] ° [address], [place] ([phone]) [phone]   °[facility]/Dr. Osei-Bonsu ° [address] [place] [address], [place] or [address], [place] [phone]) [phone] Phone number for both [place] and [place] locations is the same.  °[facility] [address], [place] ([phone]) [phone]   °[facility] [address] [place] [address],  or [address] ([phone]) [phone] °(336) [phone]   °[facility] [address],  (336) [phone], phone; ([phone]) [phone], fax Sees patients 1st and 3rd Saturday of every month.  Must not qualify for public or private insurance (i.e. Medicaid, Medicare, [place] [employer] Choice, Veterans' Benefits)  Household income should be no more than 200% of the poverty level The clinic cannot treat you if you are pregnant or think you are pregnant  Sexually transmitted diseases are not treated at the clinic.  °  ° °

## 2018-04-21 NOTE — ED Provider Notes (Signed)
[facility] EMERGENCY DEPARTMENT Provider Note   CSN: [id number] Arrival date & time: 04/21/18  0740    History   Chief Complaint Chief Complaint  Patient presents with  . Suicidal    HPI Andrew Chaney is a 63 y.o. male with a PMH of Bipolar 1 disorder and Chronic Hepatitis C presenting with suicidal ideations onset 2 days ago. Patient states repeatedly, "I am going to kill myself, I swear I am." Patient denies having a plan or previous attempts. Patient denies any recent stressful events. Patient denies any acute complaints. Patient denies fever, cough, shortness of breath, chest pain, nausea, vomiting, diarrhea, or abdominal pain. Patient denies sick exposures or recent travel. Patient denies alcohol, tobacco, or drug use upon my history. Patient denies HI, hallucinations, or delusions. Patient denies compliance with his medications. Patient is here voluntarily. Patient was recently evaluated at [facility] for suicidal ideations and discharged yesterday.     HPI  Past Medical History:  Diagnosis Date  . Bipolar 1 disorder ([place])   . 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 Renal disorder     Patient Active Problem List   Diagnosis Date Noted  . Bipolar disorder, most recent episode manic ([place]) 04/11/2018  . Bipolar I disorder, current or most recent episode manic, with psychotic features ([place]) 04/08/2018  . Confusion 01/03/2015  . Chronic hepatitis C ([place]) 12/20/2012  . Renal failure 12/17/2012  . Acute encephalopathy 12/17/2012  . ARF (acute renal failure) ([place]) 12/17/2012  . Hyponatremia 12/17/2012  . CAP (community acquired pneumonia) 12/17/2012  . Rhabdomyolysis 12/17/2012  . 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 No pertinent surgical history.     Home Medications    Prior to Admission medications   Medication Sig Start Date End Date Taking? Authorizing Provider  benztropine (COGENTIN) 0.5 MG tablet Take  1 tablet (0.5 mg total) by mouth 2 (two) times daily. 04/13/18   Mordecai Maes, NP  hydrOXYzine (ATARAX/VISTARIL) 50 MG tablet Take 1 tablet (50 mg total) by mouth 3 (three) times daily as needed for anxiety. 04/13/18   Mordecai Maes, NP  risperiDONE (RISPERDAL) 4 MG tablet Take 1 tablet (4 mg total) by mouth at bedtime. 04/14/18   Mordecai Maes, NP  temazepam (RESTORIL) 30 MG capsule Take 1 capsule (30 mg total) by mouth at bedtime. 04/13/18   Mordecai Maes, NP    Family History No family history on file.  Social History Social History   Tobacco Use  . Smoking status: Current Every Day Smoker    Types: Cigarettes  . Smokeless tobacco: Never Used  Substance Use Topics  . Alcohol use: Yes  . Drug use: Yes    Types: Marijuana    Comment: States no longer uses marijuana because he is "on probation"     Allergies   Patient has no known allergies.   Review of Systems Review of Systems  Constitutional: Negative for activity change, appetite change, fatigue, fever and unexpected weight change.  HENT: Negative for congestion and rhinorrhea.   Respiratory: Negative for cough and shortness of breath.   Cardiovascular: Negative for chest pain.  Gastrointestinal: Negative for abdominal pain, nausea and vomiting.  Endocrine: Negative for cold intolerance and heat intolerance.  Genitourinary: Negative for dysuria.  Musculoskeletal: Negative for myalgias.  Skin: Negative for rash.  Allergic/Immunologic: Negative for immunocompromised state.  Neurological: Negative for dizziness, weakness and headaches.  Psychiatric/Behavioral: Positive for dysphoric mood and suicidal ideas. Negative  for agitation, behavioral problems, confusion, decreased concentration, hallucinations, self-injury and sleep disturbance. The patient is not nervous/anxious and is not hyperactive.    Physical Exam Updated Vital Signs BP (!) 122/91 (BP Location: Right Arm)   Pulse (!) 102   Temp 98.5 F (36.9 C)  (Oral)   Resp 18   Ht 5\' 7"  (1.702 m)   Wt 75 kg   SpO2 100%   BMI 25.90 kg/m   Physical Exam Vitals signs and nursing note reviewed.  Constitutional:      General: He is not in acute distress.    Appearance: He is well-developed. He is not diaphoretic.     Comments: Patient is withdrawn and minimally cooperative throughout history. Patient will only answer limited questions and appears anxious.  HENT:     Head: Normocephalic and atraumatic.  Neck:     Musculoskeletal: Normal range of motion and neck supple.  Cardiovascular:     Rate and Rhythm: Regular rhythm. Tachycardia present.     Heart sounds: Normal heart sounds. No murmur. No friction rub. No gallop.   Pulmonary:     Effort: Pulmonary effort is normal. No respiratory distress.     Breath sounds: Normal breath sounds. No wheezing or rales.  Abdominal:     Palpations: Abdomen is soft.     Tenderness: There is no abdominal tenderness.  Musculoskeletal: Normal range of motion.  Skin:    General: Skin is warm.     Findings: No erythema, lesion or rash.  Neurological:     Mental Status: He is alert.     Comments: Pacing room, moving extremities, and ambulating without difficulty.  Psychiatric:        Attention and Perception: He is attentive.        Mood and Affect: Mood is anxious and depressed. Affect is labile. Affect is not blunt, angry or inappropriate.        Speech: Speech normal.        Behavior: Behavior is uncooperative, agitated and withdrawn.        Thought Content: Thought content is paranoid. Thought content is not delusional. Thought content includes suicidal ideation. Thought content does not include homicidal ideation. Thought content does not include homicidal or suicidal plan.        Judgment: Judgment is impulsive and inappropriate.    ED Treatments / Results  Labs (all labs ordered are listed, but only abnormal results are displayed) Labs Reviewed  COMPREHENSIVE METABOLIC PANEL  ETHANOL   SALICYLATE LEVEL  ACETAMINOPHEN LEVEL  CBC  RAPID URINE DRUG SCREEN, HOSP PERFORMED    EKG None  Radiology No results found.  Procedures Procedures (including critical care time)  Medications Ordered in ED Medications  hydrOXYzine (ATARAX/VISTARIL) tablet 50 mg (has no administration in time range)  risperiDONE (RISPERDAL) tablet 4 mg (has no administration in time range)  temazepam (RESTORIL) capsule 30 mg (has no administration in time range)  benztropine (COGENTIN) tablet 0.5 mg (has no administration in time range)     Initial Impression / Assessment and Plan / ED Course  I have reviewed the triage vital signs and the nursing notes.  Pertinent labs & imaging results that were available during my care of the patient were reviewed by me and considered in my medical decision making (see chart for details).       Patient presents to the ER with suicidal thoughts. Patient is here voluntarily. Labs and vitals reviewed. Current Plan is to have patient be evaluated by TTS  for further assessment on weather or not to be placed inpatient.  We have discussed that if the patient feels he was becoming unsafe, instead of acting on an impulse of self harm he will contact the crisis line, speak to friends about it, or return to the emergency department. Patient has been cleared to move to [facility] pending further assessment.   Final Clinical Impressions(s) / ED Diagnoses   Final diagnoses:  Suicidal ideation    ED Discharge Orders    None       Arville Lime, [place] 04/21/18 [place], MD 04/22/18 [phone]

## 2018-04-21 NOTE — ED Notes (Signed)
Sitter at bedside.

## 2018-04-21 NOTE — ED Notes (Signed)
Per Dr.Campos labs not repeated since pt was just seen and discharged earlier today.

## 2018-04-21 NOTE — ED Notes (Signed)
TTS at bedside. 
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
## 2018-04-21 NOTE — ED Notes (Signed)
Pt was given bagged lunch.

## 2018-04-21 NOTE — ED Notes (Signed)
Pt asking for more food and drink. Told Pt we had his belongings back for him and may be discharging him soon. Pt stated he can not leave b/c he has no clothes and no where to stay so he's going to freeze to death.

## 2018-04-21 NOTE — ED Triage Notes (Signed)
Pt states he is suicidal with "many different plans".  Seen in ED earlier today for same.

## 2018-04-21 NOTE — ED Notes (Signed)
Ordered lunch tray 

## 2018-04-21 NOTE — ED Notes (Signed)
Informed by charge he will be arrested for trespassing if he returns tonight, seen today x2

## 2018-04-24 ENCOUNTER — Ambulatory Visit (HOSPITAL_COMMUNITY)
Admission: AD | Admit: 2018-04-24 | Discharge: 2018-04-24 | Disposition: A | Discharge status: Home / Self Care | Payer: Federal, State, Local not specified - Other | Attending: Psychiatry | Admitting: Psychiatry

## 2018-04-24 DIAGNOSIS — R45851 Suicidal ideations: Secondary | ICD-10-CM | POA: Insufficient documentation

## 2018-04-24 DIAGNOSIS — G4700 Insomnia, unspecified: Secondary | ICD-10-CM | POA: Insufficient documentation

## 2018-04-24 DIAGNOSIS — F419 Anxiety disorder, unspecified: Secondary | ICD-10-CM | POA: Insufficient documentation

## 2018-04-24 DIAGNOSIS — R450 Nervousness: Secondary | ICD-10-CM | POA: Insufficient documentation

## 2018-04-24 DIAGNOSIS — F3132 Bipolar disorder, current episode depressed, moderate: Secondary | ICD-10-CM | POA: Insufficient documentation

## 2018-04-24 DIAGNOSIS — F22 Delusional disorders: Secondary | ICD-10-CM | POA: Insufficient documentation

## 2018-04-25 ENCOUNTER — Other Ambulatory Visit: Payer: Self-pay

## 2018-04-25 ENCOUNTER — Encounter (HOSPITAL_COMMUNITY): Payer: Self-pay | Admitting: *Deleted

## 2018-04-25 ENCOUNTER — Emergency Department (HOSPITAL_COMMUNITY)
Admission: EM | Admit: 2018-04-25 | Discharge: 2018-04-25 | Disposition: A | Discharge status: Home / Self Care | Payer: Medicaid Other | Attending: Emergency Medicine | Admitting: Emergency Medicine

## 2018-04-25 DIAGNOSIS — F312 Bipolar disorder, current episode manic severe with psychotic features: Secondary | ICD-10-CM | POA: Insufficient documentation

## 2018-04-25 DIAGNOSIS — R45851 Suicidal ideations: Secondary | ICD-10-CM | POA: Insufficient documentation

## 2018-04-25 DIAGNOSIS — Z8547 Personal history of malignant neoplasm of testis: Secondary | ICD-10-CM | POA: Insufficient documentation

## 2018-04-25 DIAGNOSIS — F17210 Nicotine dependence, cigarettes, uncomplicated: Secondary | ICD-10-CM | POA: Insufficient documentation

## 2018-04-25 DIAGNOSIS — Z79899 Other long term (current) drug therapy: Secondary | ICD-10-CM | POA: Insufficient documentation

## 2018-04-25 LAB — CBC WITH DIFFERENTIAL/PLATELET
Abs Immature Granulocytes: 0.01 10*3/uL (ref 0.00–0.07)
Basophils Absolute: 0 10*3/uL (ref 0.0–0.1)
Basophils Relative: 0 %
Eosinophils Absolute: 0.1 10*3/uL (ref 0.0–0.5)
Eosinophils Relative: 1 %
HCT: 36.6 % — ABNORMAL LOW (ref 39.0–52.0)
Hemoglobin: 11.9 g/dL — ABNORMAL LOW (ref 13.0–17.0)
Immature Granulocytes: 0 %
Lymphocytes Relative: 21 %
Lymphs Abs: 1.2 10*3/uL (ref 0.7–4.0)
MCH: 31.2 pg (ref 26.0–34.0)
MCHC: 32.5 g/dL (ref 30.0–36.0)
MCV: 96.1 fL (ref 80.0–100.0)
Monocytes Absolute: 0.7 10*3/uL (ref 0.1–1.0)
Monocytes Relative: 12 %
Neutro Abs: 3.8 10*3/uL (ref 1.7–7.7)
Neutrophils Relative %: 66 %
Platelets: 250 10*3/uL (ref 150–400)
RBC: 3.81 MIL/uL — ABNORMAL LOW (ref 4.22–5.81)
RDW: 13.6 % (ref 11.5–15.5)
WBC: 5.8 10*3/uL (ref 4.0–10.5)
nRBC: 0 % (ref 0.0–0.2)

## 2018-04-25 LAB — COMPREHENSIVE METABOLIC PANEL
ALT: 16 U/L (ref 0–44)
AST: 20 U/L (ref 15–41)
Albumin: 4.1 g/dL (ref 3.5–5.0)
Alkaline Phosphatase: 61 U/L (ref 38–126)
Anion gap: 11 (ref 5–15)
BUN: 12 mg/dL (ref 6–20)
CO2: 23 mmol/L (ref 22–32)
Calcium: 9.8 mg/dL (ref 8.9–10.3)
Chloride: 103 mmol/L (ref 98–111)
Creatinine, Ser: 0.87 mg/dL (ref 0.61–1.24)
GFR calc Af Amer: >60 mL/min (ref >60)
GFR calc non Af Amer: >60 mL/min (ref >60)
Glucose, Bld: 100 mg/dL — ABNORMAL HIGH (ref 70–99)
Potassium: 3.9 mmol/L (ref 3.5–5.1)
Sodium: 137 mmol/L (ref 135–145)
Total Bilirubin: 0.9 mg/dL (ref 0.3–1.2)
Total Protein: 6.5 g/dL (ref 6.5–8.1)

## 2018-04-25 LAB — ETHANOL: Alcohol, Ethyl (B): <10 mg/dL (ref <10)

## 2018-04-25 LAB — RAPID URINE DRUG SCREEN, HOSP PERFORMED
Amphetamines: NOT DETECTED
Barbiturates: NOT DETECTED
Benzodiazepines: NOT DETECTED
Cocaine: NOT DETECTED
Opiates: NOT DETECTED
Tetrahydrocannabinol: POSITIVE — AB

## 2018-04-25 NOTE — ED Notes (Signed)
TTS computer in room for assessment.

## 2018-04-25 NOTE — Discharge Instructions (Addendum)
As discussed, your evaluation today has been largely reassuring.  But, it is important that you monitor your condition carefully, and do not hesitate to return to the ED if you develop new, or concerning changes in your condition. ? ?Otherwise, please follow-up with your physician for appropriate ongoing care. ? ?
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
## 2018-04-25 NOTE — ED Triage Notes (Signed)
Pt in from [facility] via [facility], per report pt uncooperative reports he is suicidal and wants to sell his food stamp card and plans on taking a lot of oxycodone pills, pt appears confused, argumentative, pt Alert
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
## 2018-04-25 NOTE — ED Provider Notes (Signed)
[facility] EMERGENCY DEPARTMENT Provider Note   CSN: [id number] Arrival date & time: 04/25/18  1646    History   Chief Complaint Chief Complaint  Patient presents with  . Suicidal    HPI Andrew Chaney is a 63 y.o. male.     HPI Patient presents 4 days after most recent emergency department evaluation 1 day after most recent behavioral health investigation, now with concern for suicidal ideation. Patient is a very poor historian acknowledges homelessness, suicide ideation, and substance abuse issues. He notes that he is currently interested in selling his food stamp card, and take the money to buy oxycodone and a quantity sufficient to kill himself. He notes some mild ongoing physical discomfort in his legs, but no new pain, no new confusion, no new fever. He states that after being evaluated yesterday, discharged from behavioral health he wandered about downtown, was reportedly arrested, and today was seeking to find money to kill himself. He was brought here from pharmacy, per EMS/police escort after being found restless in a store. Past Medical History:  Diagnosis Date  . Bipolar 1 disorder ([place])   . 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 Renal disorder     Patient Active Problem List   Diagnosis Date Noted  . Bipolar disorder, most recent episode manic ([place]) 04/11/2018  . Bipolar I disorder, current or most recent episode manic, with psychotic features ([place]) 04/08/2018  . Confusion 01/03/2015  . Chronic hepatitis C ([place]) 12/20/2012  . Renal failure 12/17/2012  . Acute encephalopathy 12/17/2012  . ARF (acute renal failure) ([place]) 12/17/2012  . Hyponatremia 12/17/2012  . CAP (community acquired pneumonia) 12/17/2012  . Rhabdomyolysis 12/17/2012  . 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 No pertinent surgical history.      Home Medications    Prior to Admission medications   Medication Sig Start Date End  Date Taking? Authorizing Provider  benztropine (COGENTIN) 0.5 MG tablet Take 1 tablet (0.5 mg total) by mouth 2 (two) times daily. 04/13/18   Mordecai Maes, NP  hydrOXYzine (ATARAX/VISTARIL) 50 MG tablet Take 1 tablet (50 mg total) by mouth 3 (three) times daily as needed for anxiety. 04/13/18   Mordecai Maes, NP  paliperidone (INVEGA SUSTENNA) 234 MG/1.5ML SUSY injection Inject 234 mg into the muscle once.    [provider]  risperiDONE (RISPERDAL) 4 MG tablet Take 1 tablet (4 mg total) by mouth at bedtime. 04/14/18   Mordecai Maes, NP  temazepam (RESTORIL) 30 MG capsule Take 1 capsule (30 mg total) by mouth at bedtime. 04/13/18   Mordecai Maes, NP    Family History History reviewed. No pertinent family history.  Social History Social History   Tobacco Use  . Smoking status: Current Every Day Smoker    Types: Cigarettes  . Smokeless tobacco: Never Used  Substance Use Topics  . Alcohol use: Yes  . Drug use: Yes    Types: Marijuana    Comment: States no longer uses marijuana because he is "on probation"     Allergies   Patient has no known allergies.   Review of Systems Review of Systems  Constitutional:       Per HPI, otherwise negative  HENT:       Per HPI, otherwise negative  Respiratory:       Per HPI, otherwise negative  Cardiovascular:       Per HPI, otherwise negative  Gastrointestinal: Negative for vomiting.  Endocrine:  Negative aside from HPI  Genitourinary:       Neg aside from HPI   Musculoskeletal:       Per HPI, otherwise negative  Skin: Negative.   Neurological: Negative for syncope.  Psychiatric/Behavioral: Positive for decreased concentration, dysphoric mood, sleep disturbance and suicidal ideas. The patient is nervous/anxious.      Physical Exam Updated Vital Signs BP 131/90 (BP Location: Right Arm)   Pulse 77   Temp 98.2 F (36.8 C) (Oral)   Resp 16   Ht 5\' 7"  (1.702 m)   Wt 75 kg   SpO2 99%   BMI 25.90 kg/m    Physical Exam Vitals signs and nursing note reviewed.  Constitutional:      General: He is not in acute distress.    Appearance: He is well-developed.     Comments: Disheveled adult male awake and alert  HENT:     Head: Normocephalic and atraumatic.  Eyes:     Conjunctiva/sclera: Conjunctivae normal.  Cardiovascular:     Rate and Rhythm: Normal rate and regular rhythm.  Pulmonary:     Effort: Pulmonary effort is normal. No respiratory distress.     Breath sounds: No stridor.  Abdominal:     General: There is no distension.  Skin:    General: Skin is warm and dry.  Neurological:     Mental Status: He is alert and oriented to person, place, and time.  Psychiatric:     Comments: Little insight into his condition, repetitive speech, fixation on obtaining sufficient quantities of narcotics to kill himself.      ED Treatments / Results  Labs (all labs ordered are listed, but only abnormal results are displayed) Labs Reviewed  COMPREHENSIVE METABOLIC PANEL - Abnormal; Notable for the following components:      Result Value   Glucose, Bld 100 (*)    All other components within normal limits  CBC WITH DIFFERENTIAL/PLATELET - Abnormal; Notable for the following components:   RBC 3.81 (*)    Hemoglobin 11.9 (*)    HCT 36.6 (*)    All other components within normal limits  ETHANOL  RAPID URINE DRUG SCREEN, HOSP PERFORMED    Procedures Procedures (including critical care time)    Initial Impression / Assessment and Plan / ED Course  I have reviewed the triage vital signs and the nursing notes.  Pertinent labs & imaging results that were available during my care of the patient were reviewed by me and considered in my medical decision making (see chart for details).        6:29 PM Patient has been seen and evaluated our behavioral health colleagues, with whom I have discussed his case. He remains hemodynamically unremarkable. Note that the patient has some perseverates  on self-harm, but is acting at baseline. Absent physical distress, the patient encouraged to follow-up with [facility] behavioral health services, discharged in stable condition.  Final Clinical Impressions(s) / ED Diagnoses  Suicidal thoughts   Carmin Muskrat, MD 04/25/18 [phone]

## 2018-04-25 NOTE — H&P (Signed)
Behavioral Health Medical Screening Exam  Andrew Chaney is an 63 y.o. male.  Total Time spent with patient: 30 minutes  Psychiatric Specialty Exam: Physical Exam  Constitutional: He is oriented to person, place, and time. He appears well-developed and well-nourished. No distress.  HENT:  Head: Normocephalic and atraumatic.  Right Ear: External ear normal.  Left Ear: External ear normal.  Respiratory: Effort normal. No respiratory distress.  Musculoskeletal: Normal range of motion.  Neurological: He is alert and oriented to person, place, and time.  Skin: He is not diaphoretic.  Psychiatric: His mood appears anxious. He is not withdrawn and not actively hallucinating. Thought content is delusional. Thought content is not paranoid. He exhibits a depressed mood. He expresses suicidal ideation. He expresses no homicidal ideation. He expresses no suicidal plans.    Review of Systems  Constitutional: Negative for chills, diaphoresis, fever, malaise/fatigue and weight loss.  Respiratory: Negative for cough and shortness of breath.   Gastrointestinal: Negative for diarrhea, nausea and vomiting.  Psychiatric/Behavioral: Positive for depression, hallucinations and suicidal ideas. Negative for memory loss and substance abuse. The patient is nervous/anxious and has insomnia.     There were no vitals taken for this visit.There is no height or weight on file to calculate BMI.  General Appearance: Casual and Fairly Groomed  Eye Contact:  Good  Speech:  Clear and Coherent and Normal Rate  Volume:  Normal  Mood:  Depressed  Affect:  Congruent  Thought Process:  Coherent and Descriptions of Associations: Intact  Orientation:  Full (Time, Place, and Person)  Thought Content:  Linear  Suicidal Thoughts:  Yes.  without intent/plan  Homicidal Thoughts:  No  Memory:  Immediate;   Fair Recent;   Fair  Judgement:  Fair  Insight:  Fair  Psychomotor Activity:  Decreased  Concentration: Concentration:  Fair and Attention Span: Fair  Recall:  [employer] of Knowledge:Good  Language: Good  Akathisia:  Negative  Handed:  Right  AIMS (if indicated):     Assets:  Communication Skills Leisure Time Physical Health  Sleep:       Musculoskeletal: Strength & Muscle Tone: within normal limits Gait & Station: normal    Recommendations:  Based on my evaluation the patient does not appear to have an emergency medical condition.  No evidence of imminent risk to self or others at present.   Patient does not meet criteria for psychiatric inpatient admission. Supportive therapy provided about ongoing stressors. Discussed crisis plan, support from social network, calling 911, coming to the Emergency Department, and calling Suicide Hotline.   Rozetta Nunnery, NP 04/25/2018, 12:28 AM

## 2018-04-25 NOTE — ED Notes (Signed)
Pt unable to sign paperwork for discharge. Pt received dc paperwork. Pt escorted by [employer]. Pt given his belongings back. Pt to follow up at [facility]; pt has been educated.

## 2018-04-29 ENCOUNTER — Encounter (HOSPITAL_COMMUNITY): Payer: Self-pay | Admitting: *Deleted

## 2018-04-29 ENCOUNTER — Emergency Department (HOSPITAL_COMMUNITY)
Admission: EM | Admit: 2018-04-29 | Discharge: 2018-04-29 | Disposition: A | Discharge status: Home / Self Care | Payer: Medicaid Other | Attending: Emergency Medicine | Admitting: Emergency Medicine

## 2018-04-29 ENCOUNTER — Other Ambulatory Visit: Payer: Self-pay

## 2018-04-29 DIAGNOSIS — Z8547 Personal history of malignant neoplasm of testis: Secondary | ICD-10-CM | POA: Insufficient documentation

## 2018-04-29 DIAGNOSIS — Z79899 Other long term (current) drug therapy: Secondary | ICD-10-CM | POA: Insufficient documentation

## 2018-04-29 DIAGNOSIS — F17210 Nicotine dependence, cigarettes, uncomplicated: Secondary | ICD-10-CM | POA: Insufficient documentation

## 2018-04-29 DIAGNOSIS — M79604 Pain in right leg: Secondary | ICD-10-CM | POA: Insufficient documentation

## 2018-04-29 NOTE — ED Notes (Signed)
Pt given and verbalized understanding of d/c instructions. Refused to sign d/c. Pt escorted off of property by security and off-duty [employer] officer due to angry outbursts about being discharged. Pt ambulated well out of department.

## 2018-04-29 NOTE — ED Triage Notes (Signed)
Pt arrives ambulatory via EMS, with c/o bilateral lower extremity pain and swelling. Also having hand pain after breaking it several weeks ago. 148/96, hr 80, 99% RA, 97.9 temp.

## 2018-04-29 NOTE — ED Notes (Signed)
Pt ambulated from triage to room with no visible difficulty.

## 2018-04-29 NOTE — ED Notes (Signed)
Pt using restroom. Security on standby to escort pt off of property upon discharge. Pt ambulating through department without difficulty yelling "I hate this place, Call me a cab and give me coffee now!"

## 2018-04-29 NOTE — ED Provider Notes (Signed)
[facility] DEPT Provider Note   CSN: [id number] Arrival date & time: 04/29/18  2253    History   Chief Complaint Chief Complaint  Patient presents with  . Leg Pain    HPI Andrew Chaney is a 63 y.o. male.     The history is provided by the patient.  Leg Pain  Location:  Leg Leg location:  R leg and L leg Pain details:    Quality:  Aching   Severity:  Moderate   Timing:  Constant Chronicity:  Chronic Relieved by:  Nothing Worsened by:  Nothing Associated symptoms: no fever   Patient presents for variety of complaints. He reports he was at [employer] trying to buy a tent when the police were called and he decided come emergency department.  Patient has history of bipolar disorder. He reports he is has chronic leg pain and swelling for a long time.  He also reports left hand pain after a fracture last month.  Past Medical History:  Diagnosis Date  . Bipolar 1 disorder ([place])   . 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 Renal disorder     Patient Active Problem List   Diagnosis Date Noted  . Bipolar disorder, most recent episode manic ([place]) 04/11/2018  . Bipolar I disorder, current or most recent episode manic, with psychotic features ([place]) 04/08/2018  . Confusion 01/03/2015  . Chronic hepatitis C ([place]) 12/20/2012  . Renal failure 12/17/2012  . Acute encephalopathy 12/17/2012  . ARF (acute renal failure) ([place]) 12/17/2012  . Hyponatremia 12/17/2012  . CAP (community acquired pneumonia) 12/17/2012  . Rhabdomyolysis 12/17/2012  . 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 No pertinent surgical history.      Home Medications    Prior to Admission medications   Medication Sig Start Date End Date Taking? Authorizing Provider  benztropine (COGENTIN) 0.5 MG tablet Take 1 tablet (0.5 mg total) by mouth 2 (two) times daily. 04/13/18   Mordecai Maes, NP  hydrOXYzine (ATARAX/VISTARIL) 50 MG tablet Take 1  tablet (50 mg total) by mouth 3 (three) times daily as needed for anxiety. 04/13/18   Mordecai Maes, NP  paliperidone (INVEGA SUSTENNA) 234 MG/1.5ML SUSY injection Inject 234 mg into the muscle once.    [provider]  risperiDONE (RISPERDAL) 4 MG tablet Take 1 tablet (4 mg total) by mouth at bedtime. 04/14/18   Mordecai Maes, NP  temazepam (RESTORIL) 30 MG capsule Take 1 capsule (30 mg total) by mouth at bedtime. 04/13/18   Mordecai Maes, NP    Family History No family history on file.  Social History Social History   Tobacco Use  . Smoking status: Current Every Day Smoker    Types: Cigarettes  . Smokeless tobacco: Never Used  Substance Use Topics  . Alcohol use: Yes  . Drug use: Yes    Types: Marijuana    Comment: States no longer uses marijuana because he is "on probation"     Allergies   Patient has no known allergies.   Review of Systems Review of Systems  Constitutional: Negative for fever.  Musculoskeletal: Positive for arthralgias.     Physical Exam Updated Vital Signs BP (!) 133/95 (BP Location: Right Arm)   Pulse 86   Temp 98.5 F (36.9 C) (Oral)   Resp 18   SpO2 99%   Physical Exam CONSTITUTIONAL: Disheveled, no acute distress HEAD: Normocephalic/atraumatic EYES: EOMI ENMT: Mucous membranes moist NECK: supple no meningeal signs  LUNGS:  no apparent distress ABDOMEN: soft NEURO: Pt is awake/alert/appropriate, moves all extremitiesx4.  No facial droop.  Patient walks around in no distress EXTREMITIES: pulses normal/equal, full ROM, chronic edema noted lower extremities.  No significant erythema SKIN: warm, color normal PSYCH: Mildly anxious.  Patient has disorganized thinking.   ED Treatments / Results  Labs (all labs ordered are listed, but only abnormal results are displayed) Labs Reviewed - No data to display  EKG None  Radiology No results found.  Procedures Procedures (including critical care time)  Medications Ordered  in ED Medications - No data to display   Initial Impression / Assessment and Plan / ED Course  I have reviewed the triage vital signs and the nursing notes.          Patient presents for his 11th ER visit in 6 months.  He typically presents for psychiatric complaints.  Tonight he reports he has leg pain. He is walking around the room drinking coffee and eating pork grinds.  He also begins to tell about his cell phone.  No acute issues at this time Vitals appropriate Will discharge  Final Clinical Impressions(s) / ED Diagnoses   Final diagnoses:  Right leg pain    ED Discharge Orders    None       Ripley Fraise, MD 04/29/18 2326

## 2018-04-30 ENCOUNTER — Emergency Department (HOSPITAL_COMMUNITY)
Admission: EM | Admit: 2018-04-30 | Discharge: 2018-04-30 | Discharge status: Against Medical Advice | Payer: Medicaid Other

## 2018-04-30 NOTE — ED Notes (Signed)
Pt arrives via [facility], pt immediately walked into hall stating he does not want to be seen, would like to call a cab and leave. Pt did not state what was wrong with him. Staff attempted to have pt stay, pt refused stating he would like to leave. Pt escorted out of ER. Charge, RN aware.

## 2018-09-01 ENCOUNTER — Encounter (HOSPITAL_COMMUNITY): Payer: Self-pay | Admitting: Emergency Medicine

## 2018-09-01 ENCOUNTER — Emergency Department (HOSPITAL_COMMUNITY)
Admission: EM | Admit: 2018-09-01 | Discharge: 2018-09-05 | Disposition: A | Discharge status: Home / Self Care | Payer: Self-pay | Attending: Emergency Medicine | Admitting: Emergency Medicine

## 2018-09-01 DIAGNOSIS — Z590 Homelessness: Secondary | ICD-10-CM | POA: Insufficient documentation

## 2018-09-01 DIAGNOSIS — Z20828 Contact with and (suspected) exposure to other viral communicable diseases: Secondary | ICD-10-CM | POA: Insufficient documentation

## 2018-09-01 DIAGNOSIS — F17210 Nicotine dependence, cigarettes, uncomplicated: Secondary | ICD-10-CM | POA: Insufficient documentation

## 2018-09-01 DIAGNOSIS — F29 Unspecified psychosis not due to a substance or known physiological condition: Secondary | ICD-10-CM

## 2018-09-01 DIAGNOSIS — Z79899 Other long term (current) drug therapy: Secondary | ICD-10-CM | POA: Insufficient documentation

## 2018-09-01 DIAGNOSIS — Z01818 Encounter for other preprocedural examination: Secondary | ICD-10-CM

## 2018-09-01 DIAGNOSIS — F319 Bipolar disorder, unspecified: Secondary | ICD-10-CM | POA: Insufficient documentation

## 2018-09-01 DIAGNOSIS — R410 Disorientation, unspecified: Secondary | ICD-10-CM | POA: Insufficient documentation

## 2018-09-01 LAB — CBC WITH DIFFERENTIAL/PLATELET
Abs Immature Granulocytes: 0.07 10*3/uL (ref 0.00–0.07)
Basophils Absolute: 0 10*3/uL (ref 0.0–0.1)
Basophils Relative: 0 %
Eosinophils Absolute: 0 10*3/uL (ref 0.0–0.5)
Eosinophils Relative: 0 %
HCT: 41.7 % (ref 39.0–52.0)
Hemoglobin: 13.7 g/dL (ref 13.0–17.0)
Immature Granulocytes: 1 %
Lymphocytes Relative: 5 %
Lymphs Abs: 0.7 10*3/uL (ref 0.7–4.0)
MCH: 32.5 pg (ref 26.0–34.0)
MCHC: 32.9 g/dL (ref 30.0–36.0)
MCV: 99 fL (ref 80.0–100.0)
Monocytes Absolute: 0.8 10*3/uL (ref 0.1–1.0)
Monocytes Relative: 6 %
Neutro Abs: 12.7 10*3/uL — ABNORMAL HIGH (ref 1.7–7.7)
Neutrophils Relative %: 88 %
Platelets: 235 10*3/uL (ref 150–400)
RBC: 4.21 MIL/uL — ABNORMAL LOW (ref 4.22–5.81)
RDW: 12.9 % (ref 11.5–15.5)
WBC: 14.3 10*3/uL — ABNORMAL HIGH (ref 4.0–10.5)
nRBC: 0 % (ref 0.0–0.2)

## 2018-09-01 LAB — COMPREHENSIVE METABOLIC PANEL
ALT: 46 U/L — ABNORMAL HIGH (ref 0–44)
AST: 115 U/L — ABNORMAL HIGH (ref 15–41)
Albumin: 4.5 g/dL (ref 3.5–5.0)
Alkaline Phosphatase: 59 U/L (ref 38–126)
Anion gap: 19 — ABNORMAL HIGH (ref 5–15)
BUN: 24 mg/dL — ABNORMAL HIGH (ref 6–20)
CO2: 16 mmol/L — ABNORMAL LOW (ref 22–32)
Calcium: 9.8 mg/dL (ref 8.9–10.3)
Chloride: 104 mmol/L (ref 98–111)
Creatinine, Ser: 1.09 mg/dL (ref 0.61–1.24)
GFR calc Af Amer: >60 mL/min (ref >60)
GFR calc non Af Amer: >60 mL/min (ref >60)
Glucose, Bld: 78 mg/dL (ref 70–99)
Potassium: 4.2 mmol/L (ref 3.5–5.1)
Sodium: 139 mmol/L (ref 135–145)
Total Bilirubin: 3.5 mg/dL — ABNORMAL HIGH (ref 0.3–1.2)
Total Protein: 7.7 g/dL (ref 6.5–8.1)

## 2018-09-01 LAB — RAPID URINE DRUG SCREEN, HOSP PERFORMED
Amphetamines: NOT DETECTED
Barbiturates: NOT DETECTED
Benzodiazepines: NOT DETECTED
Cocaine: NOT DETECTED
Opiates: NOT DETECTED
Tetrahydrocannabinol: POSITIVE — AB

## 2018-09-01 LAB — URINALYSIS, ROUTINE W REFLEX MICROSCOPIC
Bilirubin Urine: NEGATIVE
Glucose, UA: NEGATIVE mg/dL
Ketones, ur: 80 mg/dL — AB
Leukocytes,Ua: NEGATIVE
Nitrite: NEGATIVE
Protein, ur: 100 mg/dL — AB
Specific Gravity, Urine: 1.023 (ref 1.005–1.030)
pH: 5 (ref 5.0–8.0)

## 2018-09-01 LAB — ETHANOL: Alcohol, Ethyl (B): <10 mg/dL (ref <10)

## 2018-09-01 MED ORDER — ACETAMINOPHEN 325 MG PO TABS: 650.0000 mg | ORAL_TABLET | ORAL | Status: DC | PRN

## 2018-09-01 MED ORDER — RISPERIDONE 2 MG PO TABS
4.0000 mg | ORAL_TABLET | Freq: Every day | ORAL | Status: DC
  Administered 2018-09-01: 4 mg via ORAL
  Filled 2018-09-01: qty 2

## 2018-09-01 MED ORDER — STERILE WATER FOR INJECTION IJ SOLN
INTRAMUSCULAR | Status: AC
  Administered 2018-09-01: 10 mL
  Filled 2018-09-01: qty 10

## 2018-09-01 MED ORDER — TEMAZEPAM 15 MG PO CAPS
30.0000 mg | ORAL_CAPSULE | Freq: Every day | ORAL | Status: DC
  Administered 2018-09-01: 30 mg via ORAL
  Filled 2018-09-01: qty 2

## 2018-09-01 MED ORDER — ZIPRASIDONE MESYLATE 20 MG IM SOLR
10.0000 mg | Freq: Once | INTRAMUSCULAR | Status: AC
  Administered 2018-09-01: 10 mg via INTRAMUSCULAR
  Filled 2018-09-01: qty 20

## 2018-09-01 MED ORDER — ZOLPIDEM TARTRATE 5 MG PO TABS: 5.0000 mg | ORAL_TABLET | Freq: Every evening | ORAL | Status: DC | PRN

## 2018-09-01 MED ORDER — ALUM & MAG HYDROXIDE-SIMETH 200-200-20 MG/5ML PO SUSP: 30.0000 mL | Freq: Four times a day (QID) | ORAL | Status: DC | PRN

## 2018-09-01 NOTE — ED Provider Notes (Signed)
[facility] DEPT Provider Note   CSN: [id number] Arrival date & time: 09/01/18  [id number]     History   Chief Complaint No chief complaint on file.   HPI Andrew Chaney is a 63 y.o. male.     The history is provided by the patient, the EMS personnel and medical records. No language interpreter was used.     63 year old male who is homeless, with history of bipolar, [place] for evaluation of altered mental status.  Per EMS note, patient was [place] from [place].  Patient really report he was recently released from [facility] a couple days ago.  Was started on that is familiar with patient states that he is "not acting right".  I was unable to obtain much history from patient.  He is aware that he is at [facility] long ER.  He does not know why he is here.  He denies SI or HI.  Does not complain of any specific pain aside from his back pain from [place] in the park last [place].  When asked if patient has any auditory or visual hallucination patient denies.  Level 5 caveats for psychiatric illness.  Patient unable to tell me if he drinks alcohol or use any drugs.  Past Medical History:  Diagnosis Date  . Bipolar 1 disorder ([place])   . 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 Renal disorder     Patient Active Problem List   Diagnosis Date Noted  . Bipolar disorder, most recent episode manic ([place]) 04/11/2018  . Bipolar I disorder, current or most recent episode manic, with psychotic features ([place]) 04/08/2018  . Confusion 01/03/2015  . Chronic hepatitis C ([place]) 12/20/2012  . Renal failure 12/17/2012  . Acute encephalopathy 12/17/2012  . ARF (acute renal failure) ([place]) 12/17/2012  . Hyponatremia 12/17/2012  . CAP (community acquired pneumonia) 12/17/2012  . Rhabdomyolysis 12/17/2012  . 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 No pertinent surgical history.      Home Medications    Prior to  Admission medications   Medication Sig Start Date End Date Taking? Authorizing Provider  benztropine (COGENTIN) 0.5 MG tablet Take 1 tablet (0.5 mg total) by mouth 2 (two) times daily. 04/13/18   Mordecai Maes, NP  hydrOXYzine (ATARAX/VISTARIL) 50 MG tablet Take 1 tablet (50 mg total) by mouth 3 (three) times daily as needed for anxiety. 04/13/18   Mordecai Maes, NP  paliperidone (INVEGA SUSTENNA) 234 MG/1.5ML SUSY injection Inject 234 mg into the muscle once.    [provider]  risperiDONE (RISPERDAL) 4 MG tablet Take 1 tablet (4 mg total) by mouth at bedtime. 04/14/18   Mordecai Maes, NP  temazepam (RESTORIL) 30 MG capsule Take 1 capsule (30 mg total) by mouth at bedtime. 04/13/18   Mordecai Maes, NP    Family History No family history on file.  Social History Social History   Tobacco Use  . Smoking status: Current Every Day Smoker    Types: Cigarettes  . Smokeless tobacco: Never Used  Substance Use Topics  . Alcohol use: Yes  . Drug use: Yes    Types: Marijuana    Comment: States no longer uses marijuana because he is "on probation"     Allergies   Patient has no known allergies.   Review of Systems Review of Systems  Unable to perform ROS: Psychiatric disorder     Physical Exam Updated Vital Signs BP 126/86  Pulse 89   Temp 98.9 F (37.2 C) (Oral)   Resp 16   SpO2 97%   Physical Exam Vitals signs and nursing note reviewed.  Constitutional:      General: He is not in acute distress.    Appearance: He is well-developed.     Comments: Disheveled appearance.  Obvious sunburned skin on face and arms  HENT:     Head: Atraumatic.  Eyes:     Conjunctiva/sclera: Conjunctivae normal.  Neck:     Musculoskeletal: Neck supple.  Cardiovascular:     Rate and Rhythm: Normal rate and regular rhythm.     Pulses: Normal pulses.     Heart sounds: Normal heart sounds.  Pulmonary:     Effort: Pulmonary effort is normal.     Breath sounds: Normal breath  sounds.  Abdominal:     Palpations: Abdomen is soft.     Tenderness: There is no abdominal tenderness.  Musculoskeletal:        General: Swelling (2+ pitting edema to bilateral lower extremities extending towards the thigh) present. No tenderness.  Skin:    Findings: No rash.  Neurological:     Mental Status: He is alert. He is disoriented.     GCS: GCS eye subscore is 4. GCS verbal subscore is 4. GCS motor subscore is 6.  Psychiatric:        Mood and Affect: Affect is flat.        Speech: Speech is delayed.        Behavior: Behavior is withdrawn. Behavior is cooperative.        Thought Content: Thought content does not include homicidal or suicidal ideation.        Cognition and Memory: Cognition is impaired.      ED Treatments / Results  Labs (all labs ordered are listed, but only abnormal results are displayed) Labs Reviewed  COMPREHENSIVE METABOLIC PANEL - Abnormal; Notable for the following components:      Result Value   CO2 16 (*)    BUN 24 (*)    AST 115 (*)    ALT 46 (*)    Total Bilirubin 3.5 (*)    Anion gap 19 (*)    All other components within normal limits  CBC WITH DIFFERENTIAL/PLATELET - Abnormal; Notable for the following components:   WBC 14.3 (*)    RBC 4.21 (*)    Neutro Abs 12.7 (*)    All other components within normal limits  ETHANOL  RAPID URINE DRUG SCREEN, HOSP PERFORMED  URINALYSIS, ROUTINE W REFLEX MICROSCOPIC    EKG None  Radiology No results found.  Procedures Procedures (including critical care time)  Medications Ordered in ED Medications  acetaminophen (TYLENOL) tablet 650 mg (has no administration in time range)  zolpidem (AMBIEN) tablet 5 mg (has no administration in time range)  alum & mag hydroxide-simeth (MAALOX/MYLANTA) 200-200-20 MG/5ML suspension 30 mL (has no administration in time range)  risperiDONE (RISPERDAL) tablet 4 mg (has no administration in time range)  temazepam (RESTORIL) capsule 30 mg (has no  administration in time range)     Initial Impression / Assessment and Plan / ED Course  I have reviewed the triage vital signs and the nursing notes.  Pertinent labs & imaging results that were available during my care of the patient were reviewed by me and considered in my medical decision making (see chart for details).        BP 126/86   Pulse 89   Temp  98.9 F (37.2 C) (Oral)   Resp 16   SpO2 97%    Final Clinical Impressions(s) / ED Diagnoses   Final diagnoses:  Disorientation    ED Discharge Orders    None     10:27 AM Patient with history of psychosis, bipolar, brought here due to "not acting right".  Patient is homeless, recently released from [facility] several days prior.  He appears to be in no acute discomfort.  He has chronic bilateral lower extremities without any complaints.  Lungs are clear on auscultation.  Will request TTS consult.  4:10 PM Mild transaminitis with AST 115, ALT 46.  Alcohol level undetectable currently.  Mildly elelevated WBC of 14.3, non specific. No infectious sxs. Pt is medically cleared.  TTS and psychiatry has evaluated pt and recommend monitor and observed.     Domenic Moras, PA-C 09/01/18 1612    Carmin Muskrat, MD 09/04/18 [phone]

## 2018-09-01 NOTE — ED Notes (Signed)
Pt. Visibly calmer at this time, but still continues to talk aloud to himself, responding to internal stimuli. Pt. Thought process and speech is still very disorganized.
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
## 2018-09-01 NOTE — Progress Notes (Signed)
Patient instructed on need for UA sample, but having difficult time with compliance of this, due to disorganized thought process and visible confusion. Pt. Also appears to be visibly responding to internal stimuli.

## 2018-09-01 NOTE — Progress Notes (Signed)
Safety sitter not available, will watch closely by this Probation officer for safety.

## 2018-09-01 NOTE — ED Notes (Signed)
Medication administered without complications, per MD orders. Will continue to monitor closely for safety.

## 2018-09-01 NOTE — BH Assessment (Signed)
[facility] Assessment Progress Note  Case was staffed with [place] FNP who recommended patient be observed and monitored.     

## 2018-09-01 NOTE — ED Notes (Signed)
Pt. Seen by ED provider. Provider placing orders for comfort and safety of the patient.

## 2018-09-01 NOTE — Progress Notes (Addendum)
Condom catheter placed for urinary incontinence to avoid skin breakdown. Pt. Also still in briefs. Pt. Was changed into fresh brief.

## 2018-09-01 NOTE — ED Notes (Signed)
Report on Situation, Background, Assessment, and Recommendations received from day shift RN. Patient alert and in no visible distress. Patient made aware of 1:1 and security cameras for their safety. Patient instructed to come to me with needs or concerns.

## 2018-09-01 NOTE — Progress Notes (Signed)
House [facility] called for a Air cabin crew, but no sitter is available at this time. Will continue to monitor closely for safety. Will reach out to charge RN to see if sitter can be found for safety.

## 2018-09-01 NOTE — ED Notes (Signed)
Pt. Brief changed. 

## 2018-09-01 NOTE — ED Triage Notes (Addendum)
Per EMS-was down town-states he was recently released from [place] a couple of days ago-store owners state they are familiar with patient and say he's "not acting right"-patient unable to give history-LE edema-patient oriented x2-patient is homeless

## 2018-09-01 NOTE — ED Notes (Signed)
Pt to room 29. Pt changed and dress out. Pt needed assist with changing. Pt uncooperative. Pt nonsensical, difficult to understand.  Pt resting comfortably in bed.

## 2018-09-01 NOTE — ED Notes (Addendum)
Pt. Yelled for assistance to use the urinal and was aided by this writer to utilize the urinal to urinate. Urine sample also able to be obtained and has been sent to lab. Pt. Still disorganized and responding to internal stimuli. Pt. Got urine all over himself, as well as stool, so this Probation officer and tech, assisted the patient in getting completely changed again and cleaned up. Patient asked if he would like condom catheter and is agreeable to condom catheter.

## 2018-09-01 NOTE — ED Notes (Signed)
Patient constantly trying to randomly get out of bed with bed alarm on, requiring multiple staff to redirect his behavior, due to visible disorganization and responding to internal stimuli. Will notify ED provider to review presentation and see if orders will be given for safety and comfort of the patient.

## 2018-09-02 MED ORDER — ZIPRASIDONE MESYLATE 20 MG IM SOLR
10.0000 mg | Freq: Once | INTRAMUSCULAR | Status: AC
  Administered 2018-09-02: 10 mg via INTRAMUSCULAR
  Filled 2018-09-02: qty 20

## 2018-09-02 MED ORDER — TRAZODONE HCL 100 MG PO TABS
100.0000 mg | ORAL_TABLET | Freq: Every day | ORAL | Status: DC
  Administered 2018-09-02 – 2018-09-03 (×2): 100 mg via ORAL
  Filled 2018-09-02 (×3): qty 1

## 2018-09-02 MED ORDER — STERILE WATER FOR INJECTION IJ SOLN
INTRAMUSCULAR | Status: AC
  Administered 2018-09-02: 10 mL
  Filled 2018-09-02: qty 10

## 2018-09-02 MED ORDER — RISPERIDONE 2 MG PO TABS
2.0000 mg | ORAL_TABLET | Freq: Two times a day (BID) | ORAL | Status: DC
  Administered 2018-09-02 – 2018-09-05 (×6): 2 mg via ORAL
  Filled 2018-09-02 (×7): qty 1

## 2018-09-02 MED ORDER — RISPERIDONE 2 MG PO TABS: 2.0000 mg | ORAL_TABLET | Freq: Two times a day (BID) | ORAL | Status: DC

## 2018-09-02 NOTE — ED Notes (Signed)
Report on Situation, Background, Assessment, and Recommendations received from [place], Therapist, sports. Patient alert and in no visible distress. Patient made aware of 1:1 observations and security cameras for their safety. Patient instructed to communicate with staff and to me with needs or concerns.

## 2018-09-02 NOTE — ED Notes (Signed)
Pt basically stays in bed. Makes no attempt to walk and do ADLs.  Makes no attempt to get up and go to the bathroom and soils the bed. Talks in a rambling disorganized way constantly.

## 2018-09-02 NOTE — ED Notes (Signed)
Pt feeds himself with set up.  He takes medications po whole. He is alert and knows that he is in [place] at [facility]. He does not know the day or year, but remembers the year when told and remembers that there will be an election.
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
## 2018-09-02 NOTE — ED Notes (Signed)
Pt. At this time observed asleep in his bed with even respirations and unlabored. Will continue to monitor closely.

## 2018-09-02 NOTE — ED Notes (Addendum)
Pt's apparent incontinence seems to be related more to preference than lack of ability. He shows no preference for getting out of bed at all, for any reason. Musculature in his legs appears strong and developed normal for his age. He is able to move in bed. Able to move legs, to turn over. But becomes confused when asked if he can walk and appears to become too disorganized when asked to get out of bed. He fumbles with covers, worries about "booties" and just cannot seem to get going.  Staff questions if there might be secondary gains from being dependent.  He did ask staff to watch him urinate in the bottle. He was redirected.

## 2018-09-02 NOTE — ED Notes (Signed)
Patient in bed awake visibly agitated, responding to internal stimuli, and speaking out loud in a disorganized manor. Presentation reported to provider with orders received to give IM medications. Will continue to monitor closely.

## 2018-09-03 DIAGNOSIS — F312 Bipolar disorder, current episode manic severe with psychotic features: Secondary | ICD-10-CM

## 2018-09-03 LAB — CBC WITH DIFFERENTIAL/PLATELET
Abs Immature Granulocytes: 0.02 10*3/uL (ref 0.00–0.07)
Basophils Absolute: 0 10*3/uL (ref 0.0–0.1)
Basophils Relative: 0 %
Eosinophils Absolute: 0 10*3/uL (ref 0.0–0.5)
Eosinophils Relative: 0 %
HCT: 35 % — ABNORMAL LOW (ref 39.0–52.0)
Hemoglobin: 11.6 g/dL — ABNORMAL LOW (ref 13.0–17.0)
Immature Granulocytes: 0 %
Lymphocytes Relative: 19 %
Lymphs Abs: 1.1 10*3/uL (ref 0.7–4.0)
MCH: 32.6 pg (ref 26.0–34.0)
MCHC: 33.1 g/dL (ref 30.0–36.0)
MCV: 98.3 fL (ref 80.0–100.0)
Monocytes Absolute: 0.7 10*3/uL (ref 0.1–1.0)
Monocytes Relative: 13 %
Neutro Abs: 3.7 10*3/uL (ref 1.7–7.7)
Neutrophils Relative %: 68 %
Platelets: 221 10*3/uL (ref 150–400)
RBC: 3.56 MIL/uL — ABNORMAL LOW (ref 4.22–5.81)
RDW: 13.4 % (ref 11.5–15.5)
WBC: 5.6 10*3/uL (ref 4.0–10.5)
nRBC: 0 % (ref 0.0–0.2)

## 2018-09-03 LAB — COMPREHENSIVE METABOLIC PANEL
ALT: 93 U/L — ABNORMAL HIGH (ref 0–44)
AST: 255 U/L — ABNORMAL HIGH (ref 15–41)
Albumin: 3.5 g/dL (ref 3.5–5.0)
Alkaline Phosphatase: 41 U/L (ref 38–126)
Anion gap: 11 (ref 5–15)
BUN: 12 mg/dL (ref 6–20)
CO2: 23 mmol/L (ref 22–32)
Calcium: 9.1 mg/dL (ref 8.9–10.3)
Chloride: 106 mmol/L (ref 98–111)
Creatinine, Ser: 0.63 mg/dL (ref 0.61–1.24)
GFR calc Af Amer: >60 mL/min (ref >60)
GFR calc non Af Amer: >60 mL/min (ref >60)
Glucose, Bld: 104 mg/dL — ABNORMAL HIGH (ref 70–99)
Potassium: 3.3 mmol/L — ABNORMAL LOW (ref 3.5–5.1)
Sodium: 140 mmol/L (ref 135–145)
Total Bilirubin: 1.4 mg/dL — ABNORMAL HIGH (ref 0.3–1.2)
Total Protein: 6 g/dL — ABNORMAL LOW (ref 6.5–8.1)

## 2018-09-03 LAB — URINALYSIS, ROUTINE W REFLEX MICROSCOPIC
Bilirubin Urine: NEGATIVE
Glucose, UA: NEGATIVE mg/dL
Hgb urine dipstick: NEGATIVE
Ketones, ur: 80 mg/dL — AB
Leukocytes,Ua: NEGATIVE
Nitrite: NEGATIVE
Protein, ur: NEGATIVE mg/dL
Specific Gravity, Urine: 1.026 (ref 1.005–1.030)
pH: 6 (ref 5.0–8.0)

## 2018-09-03 LAB — AMMONIA: Ammonia: 20 umol/L (ref 9–35)

## 2018-09-03 LAB — LACTIC ACID, PLASMA
Lactic Acid, Venous: 0.9 mmol/L (ref 0.5–1.9)
Lactic Acid, Venous: 0.8 mmol/L (ref 0.5–1.9)

## 2018-09-03 LAB — ACETAMINOPHEN LEVEL: Acetaminophen (Tylenol), Serum: <10 ug/mL — ABNORMAL LOW (ref 10–30)

## 2018-09-03 LAB — SALICYLATE LEVEL: Salicylate Lvl: <7 mg/dL (ref 2.8–30.0)

## 2018-09-03 NOTE — ED Notes (Signed)
Pt changed and repostioned

## 2018-09-03 NOTE — ED Notes (Signed)
Pt has sitter at bedside. Pt staring, not talking.

## 2018-09-03 NOTE — ED Provider Notes (Signed)
2:01 PM Rounded on patient. Presented on 09/01/18 after EMS brought him in. The best I can tell, he is homeless and bystander called because he was behavior was odd. My history is essentially from review of records.  Nursing reports that he has remained in bed since he has been here. He has been urinating himself. I'm not sure of his exact baseline but a note from April 18th, 2020 mentions "walking around the room drinking coffee and eating pork rinds."  On exam exam he is awake. Very slow to respond to questions, sometimes not at all. Possibly thought blocking. Tells me "I'm not good." "I'm going to die." He has a hard time describing symptoms beyond this. He specifically denied pain. He has a small mass on his L forearm and was aware enough to tell me "That's a lipoma." Abdomen benign. Symmetric LE edema. He says this is not new. He has been incontinent of urine. When asked if this is a new problem for him, he tell me "no."  I can't get a great sense of his symptoms. Initial w/u reviewed. Metabolic acidosis and leukocytosis. Mild elevation in LFTs but hx of hep C. Pt still not getting out of bed. Globally weak. I think utility in repeating labs again.    Virgel Manifold, MD 09/03/18 [phone]

## 2018-09-03 NOTE — ED Notes (Signed)
Pt resting in bed comfortably.  Pt staring. Guarded. No talking.  Poor PO intake.

## 2018-09-03 NOTE — Consult Note (Addendum)
[facility] Psych ED Progress ote  09/03/2018 12:29 PM Andrew Chaney  MRN:  [id number] Principal Problem: <principal problem not specified> Discharge Diagnoses: Active Problems:   * No active hospital problems. *   Subjective: Unable to assess at this time. During the evaluation patient is observed picking the thread out of the blanket. He does make eye contact however makes no attempt to speak verbally. He presents much different than he did yesterday as he was able to engage well. Today he is exhibiting some  Mutism. He does appear to be psychotic however is not responding to internal stimuli, and does not appear to be preoccupied. Despite multiple attempts he makes no efforts to communicate, and continues picking at the blanket.   HPI:  Andrew Chaney is an 63 y.o. male that presents this date voluntary with altered mental state. Patient is observed to be very disorganized and is unable to render his history. Patient displays active thought blocking and does not seem to process the content of this writer's questions. Patient was questioned in reference to being released a few days ago from [facility] (per notes) although states he did not report that. Patient denies any S/I, H/I or AVH. Patient per collateral from staff who is sitting outside room states that patient had urinated on the floor earlier and has been talking to objects in the room. Patient does not appear to be responding to internal stimuli at the time of assessment. Patient per notes has a history of alcohol and cannabis use with BAL and UDS pending this date. Information to complete assessment was obtained from history. Per notes, patient is homeless, with history of bipolar, [place] evaluation of altered mental status. Per EMS note, [place]. Patient reports he was recently released from [place] couple days ago. Patient per report by EMS was contacted by someone in the community  who reported "he was acting strange" and was transported to [facility] for evaluation. Patient is oriented to place only and speaks in a pressured voice that is nonsensical at times. He does not know why he is here. He denies SI or HI. When asked if patient has any auditory or visual hallucination patient denies. Per chart review patient was last seen on 04/25/18 when he presented with similar symptoms although he was S/I on that date with a plan to overdose on medications. This date his speech was tangential, thoughts were disorganized, and he made poor eye contact. His mood is anxious and affect is congruent. Patient has poor insight, judgement, and impulse control.    Total Time spent with patient: 30 minutes  Past Psychiatric History: As above  Past Medical History:  Past Medical History:  Diagnosis Date  . Bipolar 1 disorder ([place])   . 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 Renal disorder    History reviewed. No pertinent surgical history. Family History: No family history on file. Family Psychiatric  History: Pt did not give this information Social History:  Social History   Substance and Sexual Activity  Alcohol Use Yes     Social History   Substance and Sexual Activity  Drug Use Yes  . Types: Marijuana   Comment: States no longer uses marijuana because he is "on probation"    Social History   Socioeconomic History  . Marital status: Single    Spouse name: Not on file  . Number of children: Not on file  .  Years of education: Not on file  . Highest education level: Not on file  Occupational History  . Not on file  Social Needs  . Financial resource strain: Not on file  . Food insecurity    Worry: Not on file    Inability: Not on file  . Transportation needs    Medical: Not on file    Non-medical: Not on file  Tobacco Use  . Smoking status: Current Every Day Smoker    Types: Cigarettes  . Smokeless tobacco: Never Used  Substance and Sexual Activity  . Alcohol  use: Yes  . Drug use: Yes    Types: Marijuana    Comment: States no longer uses marijuana because he is "on probation"  . Sexual activity: Not Currently  Lifestyle  . Physical activity    Days per week: Not on file    Minutes per session: Not on file  . Stress: Not on file  Relationships  . Social Herbalist on phone: Not on file    Gets together: Not on file    Attends religious service: Not on file    Active member of club or organization: Not on file    Attends meetings of clubs or organizations: Not on file    Relationship status: Not on file  Other Topics Concern  . Not on file  Social History Narrative  . Not on file    Has this patient used any form of tobacco in the last 30 days? (Cigarettes, Smokeless Tobacco, Cigars, and/or Pipes) Prescription not provided because: Pt does not use tobacco products  Current Medications: Current Facility-Administered Medications  Medication Dose Route Frequency Provider Last Rate Last Dose  . acetaminophen (TYLENOL) tablet 650 mg  650 mg Oral Q4H PRN Domenic Moras, PA-C      . alum & mag hydroxide-simeth (MAALOX/MYLANTA) 200-200-20 MG/5ML suspension 30 mL  30 mL Oral Q6H PRN Domenic Moras, PA-C      . risperiDONE (RISPERDAL) tablet 2 mg  2 mg Oral BID Corena Pilgrim, MD   2 mg at 09/03/18 0957  . traZODone (DESYREL) tablet 100 mg  100 mg Oral QHS Jaelen Gellerman, MD   100 mg at 09/02/18 2104   Current Outpatient Medications  Medication Sig Dispense Refill  . benztropine (COGENTIN) 0.5 MG tablet Take 1 tablet (0.5 mg total) by mouth 2 (two) times daily. 60 tablet 0  . hydrOXYzine (ATARAX/VISTARIL) 50 MG tablet Take 1 tablet (50 mg total) by mouth 3 (three) times daily as needed for anxiety. 30 tablet 0  . paliperidone (INVEGA SUSTENNA) 234 MG/1.5ML SUSY injection Inject 234 mg into the muscle once.    . risperiDONE (RISPERDAL) 4 MG tablet Take 1 tablet (4 mg total) by mouth at bedtime. 30 tablet 0  . temazepam (RESTORIL) 30 MG  capsule Take 1 capsule (30 mg total) by mouth at bedtime. 30 capsule 0     Musculoskeletal: Strength & Muscle Tone: within normal limits Gait & Station: normal Patient leans: N/A  Psychiatric Specialty Exam: Physical Exam  Nursing note and vitals reviewed. Constitutional: He is oriented to person, place, and time. He appears well-developed and well-nourished.  HENT:  Head: Normocephalic.  Respiratory: Effort normal.  Musculoskeletal: Normal range of motion.  Neurological: He is alert and oriented to person, place, and time.  Psychiatric: He has a normal mood and affect. His speech is normal and behavior is normal. Judgment and thought content normal. Cognition and memory are normal.  Review of Systems  Psychiatric/Behavioral: Positive for depression and hallucinations. The patient is nervous/anxious and has insomnia.   All other systems reviewed and are negative.   Blood pressure (!) 141/88, pulse (!) 108, temperature 98.9 F (37.2 C), temperature source Oral, resp. rate 18, SpO2 95 %.There is no height or weight on file to calculate BMI.  General Appearance: Casual  Eye Contact:  Fair  Speech:  Blocked  Volume:  Normal  Mood:  Negative  Affect:  Restricted and unable to assess  Thought Process:  Disorganized and Descriptions of Associations: Loose  Orientation:  Other:  UTA  Thought Content:  UTA  Suicidal Thoughts:  No  Homicidal Thoughts:  No  Memory:  Immediate;   Poor Recent;   Poor Remote;   Poor  Judgement:  Impaired  Insight:  Lacking  Psychomotor Activity:  Psychomotor Retardation and Tremor  Concentration:  Concentration: Fair and Attention Span: Poor  Recall:  Poor  Fund of Knowledge:  Poor  Language:  Poor  Akathisia:  No  Handed:  Right  AIMS (if indicated):     Assets:  Social Support  ADL's:  Impaired  Cognition:  Impaired,  Mild  Sleep:        Demographic Factors:  Male, Caucasian, Low socioeconomic status and Unemployed  Loss  Factors: Loss of significant relationship, Legal issues and Financial problems/change in socioeconomic status  Historical Factors: Family history of mental illness or substance abuse  Risk Reduction Factors:   Sense of responsibility to family, Positive social support, Positive therapeutic relationship and Positive coping skills or problem solving skills  Continued Clinical Symptoms:  Bipolar Disorder:   Mixed State  Cognitive Features That Contribute To Risk:  Closed-mindedness    Suicide Risk:  Minimal: No identifiable suicidal ideation.  Patients presenting with no risk factors but with morbid ruminations; may be classified as minimal risk based on the severity of the depressive symptoms    Plan Of Care/Follow-up recommendations:  Activity:  as tolerated Diet:  Heart Healthy  Disposition and Treatment Plan: Bipolar I disorder, current or most recent episode manic, with psychotic features ([place]) Will recommend inpatient. Patient may need to be medically clear or consider referral to neurology while in the hospital. Patient with inconsistent thought processes, incoherent, disoriented, and incontinence of urine and bowels  .Suella Broad, FNP 09/03/2018, 12:29 PM  Patient seen face-to-face for psychiatric evaluation, chart reviewed and case discussed with the physician extender and developed treatment plan. Reviewed the information documented and agree with the treatment plan. Corena Pilgrim, MD

## 2018-09-03 NOTE — ED Notes (Signed)
Followed up with EDP Thurnell Garbe in reference to pt lab results. She states that his labs are not admittable and he will remain in psych hold

## 2018-09-04 ENCOUNTER — Emergency Department (HOSPITAL_COMMUNITY): Payer: Self-pay

## 2018-09-04 LAB — NOVEL CORONAVIRUS, NAA (HOSP ORDER, SEND-OUT TO REF LAB; TAT 18-24 HRS): SARS-CoV-2, NAA: NOT DETECTED

## 2018-09-04 NOTE — ED Notes (Signed)
Dr. Mariea Clonts verbalized intent to IVC pt. Pt not allowed to leave and sitter order placed.

## 2018-09-04 NOTE — BH Assessment (Signed)
[facility] Assessment Progress Note  Per Buford Dresser, DO, this pt requires psychiatric hospitalization at this time at a facility providing specialty geriatric care.  Dr Mariea Clonts also finds that pt meets criteria for IVC, which she has initiated.  IVC documents have been faxed to [facility], and at 14:42 Dionisio Paschal confirms receipt.  She has since faxed Findings and Custody Order to this Probation officer.  At 15:07 I called [employer] and spoke to [employer], who took demographic information, agreeing to dispatch law enforcement to fill out Return of Service.  Law enforcement then presented at [facility], completing Return of Service.  Placement will be sought for this pt.

## 2018-09-04 NOTE — Progress Notes (Signed)
Patient was disorganized and unable to be assessed. He was unable to verbalize his name or year. Discussed with Dr. Mariea Clonts. Recommend psychiatric inpatient admission. The patient per notes was recently discharged from [facility] but has not been able to independently care for himself.

## 2018-09-04 NOTE — ED Notes (Signed)
Pt got OOB and is able to ambulate. He is trying to leave. Staff must redirect him and stand in his way at this time.  He took his medication.

## 2018-09-04 NOTE — ED Notes (Signed)
Pt was fed pancakes.  Previously he was feeding himself.

## 2018-09-04 NOTE — ED Notes (Signed)
EKG is completed and is on the chart. Pt slept through it.

## 2018-09-04 NOTE — ED Notes (Signed)
Pt not as alert as he was on Saturday.  When his name is called loudly he does wake up.  Previously he was feeding himself, but now he needs assistance with eating.  No longer pulling at condom cath. Continues to need assistance with ADLs.

## 2018-09-04 NOTE — ED Notes (Signed)
Messaged Dr. Wilson Singer and spoke with Dr. Leilani Merl on the telephone about pt wanting to leave.  Dr. Mariea Clonts will see pt when she rounds and determine if he is psychiatrically cleared or needs IVC.  Dr. Wilson Singer said that if pt is cleared by psych he does not meet criteria for IVC and will need to be discharged at that time.

## 2018-09-05 ENCOUNTER — Encounter (HOSPITAL_COMMUNITY): Payer: Self-pay | Admitting: Registered Nurse

## 2018-09-05 DIAGNOSIS — F29 Unspecified psychosis not due to a substance or known physiological condition: Secondary | ICD-10-CM

## 2018-09-05 LAB — HEPATITIS PANEL, ACUTE
HCV Ab: >11 s/co ratio — ABNORMAL HIGH (ref 0.0–0.9)
Hep A IgM: NEGATIVE
Hep B C IgM: NEGATIVE
Hepatitis B Surface Ag: NEGATIVE

## 2018-09-05 MED ORDER — TRAZODONE HCL 100 MG PO TABS: 100.0000 mg | ORAL_TABLET | Freq: Every day | ORAL | 0 refills | Status: DC

## 2018-09-05 MED ORDER — RISPERIDONE 2 MG PO TABS: 2.0000 mg | ORAL_TABLET | Freq: Two times a day (BID) | ORAL | 0 refills | Status: DC

## 2018-09-05 NOTE — ED Notes (Signed)
CSW at bedside.

## 2018-09-05 NOTE — Progress Notes (Signed)
CSW spoke with patient via bedside about his housing needs. Patient has been psychiatrically cleared after being evaluated over 4 days. Patient reports he has been homeless for some time. He reports he was last living at [place] and the "penitentiary." Patient reports his parents and a sibling live in the area, but he can not live with him. He did not give a specific reason why. Patient reports he is familiar with the [facility]. CSW will provided patient with additional homeless resources.   [place]en Circle, LCSW Transitions of Care Department [facility] ED [phone]

## 2018-09-05 NOTE — ED Notes (Signed)
Pt walked to bathroom. Pt brief changed. Water offered.

## 2018-09-05 NOTE — Discharge Instructions (Signed)
For your mental health needs, you are advised to follow up with [facility].  Call them at your earliest opportunity to schedule an intake appointment:       [facility]. [address]      [place], [place] [address]      [phone]      Crisis number: [phone]  For your shelter needs, contact the following service providers:       [facility] ([facility])      [place], [place] [address]      [phone]       [facility]      [address]      [place], [place] [address]      [phone]  For day shelter and other supportive services for the homeless, contact the [facility]):       [facility]      [place], [place] [address]      [phone]  For transitional housing, Secondary school teacher.  They provide longer term housing than a shelter, but there is an application process:       [facility] [employer] [facility] [employer] [address] [place], [place] [address]      [phone]

## 2018-09-05 NOTE — ED Notes (Signed)
Patient given meal tray.

## 2018-09-05 NOTE — Consult Note (Addendum)
[facility] Psych ED Discharge  09/05/2018 11:48 AM Andrew Chaney  MRN:  [id number] Principal Problem: Psychosis [facility]) Discharge Diagnoses: Principal Problem:   Psychosis [facility])   Subjective: Andrew Chaney, 63 y.o., male patient seen via tele psych by this provider, Dr. Mariea Clonts; and chart reviewed on 09/05/18.  On evaluation Andrew Chaney reports he does not remember why he came to the hospital yesterday.  States that he has been off of his medications for a while; but tolerated them with out adverse reaction once restarted yesterday.  States that he slept well last night.  Patient denies suicidal/self-harm/homicidal ideation, psychosis, and paranoia.  Patient states that he is homeless and would like some resources for housing and shelters.  Patient also reports he was sent to [facility] for an evaluation of bipolar disorder when he got out of jail.  States that is when he was diagnosed and medication was started.   During evaluation Andrew Chaney is alert/oriented x 4; calm/cooperative; and mood is congruent with affect.  He does not appear to be responding to internal/external stimuli or delusional thoughts.  Patient denies suicidal/self-harm/homicidal ideation, psychosis, and paranoia.  Patient answered question appropriately.     Total Time spent with patient: 30 minutes  Past Psychiatric History: Reports one prior psychiatric hospitalization; diagnosed with bipolar disorder  Past Medical History:  Past Medical History:  Diagnosis Date  . Bipolar 1 disorder ([place])   . 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 Renal disorder    History reviewed. No pertinent surgical history. Family History: History reviewed. No pertinent family history. Family Psychiatric  History: Father-bipolar disorder Social History:  Social History   Substance and Sexual Activity  Alcohol Use Yes     Social History   Substance and Sexual Activity  Drug Use Yes  . Types: Marijuana   Comment: States no longer uses  marijuana because he is "on probation"    Social History   Socioeconomic History  . Marital status: Single    Spouse name: Not on file  . Number of children: Not on file  . Years of education: Not on file  . Highest education level: Not on file  Occupational History  . Not on file  Social Needs  . Financial resource strain: Not on file  . Food insecurity    Worry: Not on file    Inability: Not on file  . Transportation needs    Medical: Not on file    Non-medical: Not on file  Tobacco Use  . Smoking status: Current Every Day Smoker    Types: Cigarettes  . Smokeless tobacco: Never Used  Substance and Sexual Activity  . Alcohol use: Yes  . Drug use: Yes    Types: Marijuana    Comment: States no longer uses marijuana because he is "on probation"  . Sexual activity: Not Currently  Lifestyle  . Physical activity    Days per week: Not on file    Minutes per session: Not on file  . Stress: Not on file  Relationships  . Social Herbalist on phone: Not on file    Gets together: Not on file    Attends religious service: Not on file    Active member of club or organization: Not on file    Attends meetings of clubs or organizations: Not on file    Relationship status: Not on file  Other Topics Concern  . Not on file  Social History Narrative  . Not  on file    Has this patient used any form of tobacco in the last 30 days? (Cigarettes, Smokeless Tobacco, Cigars, and/or Pipes) A prescription for an FDA-approved tobacco cessation medication was offered at discharge and the patient refused  Current Medications: Current Facility-Administered Medications  Medication Dose Route Frequency Provider Last Rate Last Dose  . acetaminophen (TYLENOL) tablet 650 mg  650 mg Oral Q4H PRN Domenic Moras, PA-C      . alum & mag hydroxide-simeth (MAALOX/MYLANTA) 200-200-20 MG/5ML suspension 30 mL  30 mL Oral Q6H PRN Domenic Moras, PA-C      . risperiDONE (RISPERDAL) tablet 2 mg  2 mg Oral  BID Darleene Cleaver, Mojeed, MD   2 mg at 09/05/18 1054  . traZODone (DESYREL) tablet 100 mg  100 mg Oral QHS Akintayo, Mojeed, MD   100 mg at 09/03/18 2130   Current Outpatient Medications  Medication Sig Dispense Refill  . benztropine (COGENTIN) 0.5 MG tablet Take 1 tablet (0.5 mg total) by mouth 2 (two) times daily. 60 tablet 0  . hydrOXYzine (ATARAX/VISTARIL) 50 MG tablet Take 1 tablet (50 mg total) by mouth 3 (three) times daily as needed for anxiety. 30 tablet 0  . paliperidone (INVEGA SUSTENNA) 234 MG/1.5ML SUSY injection Inject 234 mg into the muscle once.    . risperiDONE (RISPERDAL) 2 MG tablet Take 1 tablet (2 mg total) by mouth 2 (two) times daily. 30 tablet 0  . temazepam (RESTORIL) 30 MG capsule Take 1 capsule (30 mg total) by mouth at bedtime. 30 capsule 0  . traZODone (DESYREL) 100 MG tablet Take 1 tablet (100 mg total) by mouth at bedtime. 30 tablet 0   PTA Medications: (Not in a hospital admission)   Musculoskeletal: Strength & Muscle Tone: within normal limits Gait & Station: normal Patient leans: N/A  Psychiatric Specialty Exam: Physical Exam  Nursing note and vitals reviewed. Constitutional: He is oriented to person, place, and time. He appears well-developed and well-nourished.  HENT:  Head: Normocephalic and atraumatic.  Neck: Normal range of motion.  Respiratory: Effort normal.  Musculoskeletal: Normal range of motion.  Neurological: He is alert and oriented to person, place, and time.  Psychiatric: He has a normal mood and affect. His speech is normal and behavior is normal. Judgment and thought content normal. Cognition and memory are normal.    Review of Systems  Psychiatric/Behavioral: Positive for substance abuse. Negative for hallucinations and suicidal ideas.  All other systems reviewed and are negative.   Blood pressure (!) 93/54, pulse 75, temperature 98.2 F (36.8 C), temperature source Oral, resp. rate 18, SpO2 97 %.There is no height or weight on  file to calculate BMI.  General Appearance: Casual  Eye Contact:  Good  Speech:  Clear and Coherent and Normal Rate  Volume:  Normal  Mood:  "I'm good" Appropriate  Affect:  Appropriate and Congruent  Thought Process:  Coherent, Goal Directed and Descriptions of Associations: Intact  Orientation:  Full (Time, Place, and Person)  Thought Content:  WDL  Suicidal Thoughts:  No  Homicidal Thoughts:  No  Memory:  Immediate;   Fair Recent;   Fair Remote;   Fair  Judgement:  Intact  Insight:  Present  Psychomotor Activity:  Normal  Concentration:  Concentration: Good and Attention Span: Good  Recall:  [employer] of Knowledge:  Fair  Language:  Good  Akathisia:  No  Handed:  Right  AIMS (if indicated):   N/A  Assets:  Communication Skills Desire for Improvement  ADL's:  Intact  Cognition:  WNL  Sleep:   N/A     Demographic Factors:  Male, Caucasian and Unemployed  Loss Factors: NA  Historical Factors: Family history of mental illness or substance abuse  Risk Reduction Factors:   Religious beliefs about death  Continued Clinical Symptoms:  Alcohol/Substance Abuse/Dependencies Previous Psychiatric Diagnoses and Treatments  Cognitive Features That Contribute To Risk:  None    Suicide Risk:  Minimal: No identifiable suicidal ideation.  Patients presenting with no risk factors but with morbid ruminations; may be classified as minimal risk based on the severity of the depressive symptoms    Plan Of Care/Follow-up recommendations:  Activity:  As tolerated Diet:  Heart healthy Other:  Follow up with resources given  Disposition: No evidence of imminent risk to self or others at present.   Patient does not meet criteria for psychiatric inpatient admission. Supportive therapy provided about ongoing stressors. Discussed crisis plan, support from social network, calling 911, coming to the Emergency Department, and calling Suicide Hotline.  Shuvon Rankin, NP 09/05/2018,  11:48 AM   Patient seen by telemedicine for psychiatric evaluation, chart reviewed and case discussed with the physician extender and developed treatment plan. Reviewed the information documented and agree with the treatment plan.  Buford Dresser, DO 09/05/18 6:47 PM

## 2018-09-05 NOTE — BH Assessment (Signed)
[facility] Assessment Progress Note  Per Buford Dresser, DO, this pt does not require psychiatric hospitalization at this time.  Pt presents under IVC initiated by Dr Mariea Clonts which she has now rescinded.  Pt is to be discharged from [facility] with recommendation to follow up with [facility].  He is also to be provided with information regarding area supportive services for the homeless.  These have been included in pt's discharge instructions.  Social work will also be consulted to further address pt's psychosocial needs.  Pt's nurse has been notified.  Jalene Mullet, [place] Triage Specialist [phone]

## 2018-09-05 NOTE — Progress Notes (Signed)
09/05/2018  1350  Per Leeroy Cha she has given patient all resources.

## 2018-09-08 LAB — CULTURE, BLOOD (ROUTINE X 2)
Culture: NO GROWTH
Culture: NO GROWTH
Special Requests: ADEQUATE
Special Requests: ADEQUATE

## 2018-09-10 ENCOUNTER — Other Ambulatory Visit: Payer: Self-pay

## 2018-09-10 ENCOUNTER — Emergency Department (HOSPITAL_COMMUNITY)
Admission: EM | Admit: 2018-09-10 | Discharge: 2018-09-10 | Disposition: A | Discharge status: Home / Self Care | Payer: Self-pay | Attending: Emergency Medicine | Admitting: Emergency Medicine

## 2018-09-10 ENCOUNTER — Emergency Department (HOSPITAL_COMMUNITY): Payer: Self-pay

## 2018-09-10 DIAGNOSIS — S0101XA Laceration without foreign body of scalp, initial encounter: Secondary | ICD-10-CM | POA: Insufficient documentation

## 2018-09-10 DIAGNOSIS — E876 Hypokalemia: Secondary | ICD-10-CM | POA: Insufficient documentation

## 2018-09-10 DIAGNOSIS — Z5900 Homelessness unspecified: Secondary | ICD-10-CM

## 2018-09-10 DIAGNOSIS — Y92413 State road as the place of occurrence of the external cause: Secondary | ICD-10-CM | POA: Insufficient documentation

## 2018-09-10 DIAGNOSIS — Y939 Activity, unspecified: Secondary | ICD-10-CM | POA: Insufficient documentation

## 2018-09-10 DIAGNOSIS — Z79899 Other long term (current) drug therapy: Secondary | ICD-10-CM | POA: Insufficient documentation

## 2018-09-10 DIAGNOSIS — F1010 Alcohol abuse, uncomplicated: Secondary | ICD-10-CM | POA: Insufficient documentation

## 2018-09-10 DIAGNOSIS — Y998 Other external cause status: Secondary | ICD-10-CM | POA: Insufficient documentation

## 2018-09-10 DIAGNOSIS — Y906 Blood alcohol level of 120-199 mg/100 ml: Secondary | ICD-10-CM | POA: Insufficient documentation

## 2018-09-10 DIAGNOSIS — F1210 Cannabis abuse, uncomplicated: Secondary | ICD-10-CM | POA: Insufficient documentation

## 2018-09-10 DIAGNOSIS — W19XXXA Unspecified fall, initial encounter: Secondary | ICD-10-CM | POA: Insufficient documentation

## 2018-09-10 DIAGNOSIS — Z8547 Personal history of malignant neoplasm of testis: Secondary | ICD-10-CM | POA: Insufficient documentation

## 2018-09-10 DIAGNOSIS — F17210 Nicotine dependence, cigarettes, uncomplicated: Secondary | ICD-10-CM | POA: Insufficient documentation

## 2018-09-10 LAB — ETHANOL: Alcohol, Ethyl (B): 165 mg/dL — ABNORMAL HIGH (ref <10)

## 2018-09-10 LAB — CBC WITH DIFFERENTIAL/PLATELET
Abs Immature Granulocytes: 0.04 10*3/uL (ref 0.00–0.07)
Basophils Absolute: 0 10*3/uL (ref 0.0–0.1)
Basophils Relative: 0 %
Eosinophils Absolute: 0 10*3/uL (ref 0.0–0.5)
Eosinophils Relative: 1 %
HCT: 32.7 % — ABNORMAL LOW (ref 39.0–52.0)
Hemoglobin: 10.6 g/dL — ABNORMAL LOW (ref 13.0–17.0)
Immature Granulocytes: 1 %
Lymphocytes Relative: 14 %
Lymphs Abs: 1.1 10*3/uL (ref 0.7–4.0)
MCH: 32.6 pg (ref 26.0–34.0)
MCHC: 32.4 g/dL (ref 30.0–36.0)
MCV: 100.6 fL — ABNORMAL HIGH (ref 80.0–100.0)
Monocytes Absolute: 0.8 10*3/uL (ref 0.1–1.0)
Monocytes Relative: 10 %
Neutro Abs: 6.1 10*3/uL (ref 1.7–7.7)
Neutrophils Relative %: 74 %
Platelets: 193 10*3/uL (ref 150–400)
RBC: 3.25 MIL/uL — ABNORMAL LOW (ref 4.22–5.81)
RDW: 13.5 % (ref 11.5–15.5)
WBC: 8.1 10*3/uL (ref 4.0–10.5)
nRBC: 0 % (ref 0.0–0.2)

## 2018-09-10 LAB — COMPREHENSIVE METABOLIC PANEL
ALT: 39 U/L (ref 0–44)
AST: 32 U/L (ref 15–41)
Albumin: 2.8 g/dL — ABNORMAL LOW (ref 3.5–5.0)
Alkaline Phosphatase: 33 U/L — ABNORMAL LOW (ref 38–126)
Anion gap: 12 (ref 5–15)
BUN: 9 mg/dL (ref 6–20)
CO2: 19 mmol/L — ABNORMAL LOW (ref 22–32)
Calcium: 8.3 mg/dL — ABNORMAL LOW (ref 8.9–10.3)
Chloride: 104 mmol/L (ref 98–111)
Creatinine, Ser: 0.92 mg/dL (ref 0.61–1.24)
GFR calc Af Amer: >60 mL/min (ref >60)
GFR calc non Af Amer: >60 mL/min (ref >60)
Glucose, Bld: 105 mg/dL — ABNORMAL HIGH (ref 70–99)
Potassium: 2.8 mmol/L — ABNORMAL LOW (ref 3.5–5.1)
Sodium: 135 mmol/L (ref 135–145)
Total Bilirubin: 0.8 mg/dL (ref 0.3–1.2)
Total Protein: 4.6 g/dL — ABNORMAL LOW (ref 6.5–8.1)

## 2018-09-10 LAB — URINALYSIS, ROUTINE W REFLEX MICROSCOPIC
Bilirubin Urine: NEGATIVE
Glucose, UA: NEGATIVE mg/dL
Hgb urine dipstick: NEGATIVE
Ketones, ur: NEGATIVE mg/dL
Leukocytes,Ua: NEGATIVE
Nitrite: NEGATIVE
Protein, ur: NEGATIVE mg/dL
Specific Gravity, Urine: 1.006 (ref 1.005–1.030)
pH: 5 (ref 5.0–8.0)

## 2018-09-10 MED ORDER — SODIUM CHLORIDE 0.9 % IV BOLUS
1000.0000 mL | Freq: Once | INTRAVENOUS | Status: AC
  Administered 2018-09-10: 18:00:00 1000 mL via INTRAVENOUS

## 2018-09-10 MED ORDER — LIDOCAINE-EPINEPHRINE (PF) 2 %-1:200000 IJ SOLN
10.0000 mL | Freq: Once | INTRAMUSCULAR | Status: AC
  Administered 2018-09-10: 10 mL
  Filled 2018-09-10: qty 20

## 2018-09-10 MED ORDER — POTASSIUM CHLORIDE CRYS ER 20 MEQ PO TBCR
40.0000 meq | EXTENDED_RELEASE_TABLET | Freq: Once | ORAL | Status: AC
  Administered 2018-09-10: 40 meq via ORAL
  Filled 2018-09-10: qty 2
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
## 2018-09-10 NOTE — ED Notes (Signed)
Patient Alert and oriented to baseline. Stable and ambulatory to baseline. Patient verbalized understanding of the discharge instructions.  Patient belongings were taken by the patient.   

## 2018-09-10 NOTE — ED Notes (Signed)
Patient transported to CT 
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
## 2018-09-26 ENCOUNTER — Encounter (HOSPITAL_COMMUNITY): Payer: Self-pay

## 2018-09-26 ENCOUNTER — Emergency Department (HOSPITAL_COMMUNITY)
Admission: EM | Admit: 2018-09-26 | Discharge: 2018-09-26 | Discharge status: Against Medical Advice | Payer: Medicaid Other | Attending: Emergency Medicine | Admitting: Emergency Medicine

## 2018-09-26 ENCOUNTER — Emergency Department (HOSPITAL_COMMUNITY)
Admission: EM | Admit: 2018-09-26 | Discharge: 2018-09-26 | Discharge status: Against Medical Advice | Payer: Medicaid Other

## 2018-09-26 DIAGNOSIS — Z5321 Procedure and treatment not carried out due to patient leaving prior to being seen by health care provider: Secondary | ICD-10-CM | POA: Insufficient documentation

## 2018-09-26 DIAGNOSIS — H5713 Ocular pain, bilateral: Secondary | ICD-10-CM | POA: Insufficient documentation
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
## 2018-10-03 ENCOUNTER — Emergency Department (HOSPITAL_COMMUNITY)
Admission: EM | Admit: 2018-10-03 | Discharge: 2018-10-03 | Discharge status: Against Medical Advice | Payer: Self-pay | Attending: Emergency Medicine | Admitting: Emergency Medicine

## 2018-10-03 ENCOUNTER — Emergency Department (HOSPITAL_COMMUNITY): Payer: Self-pay

## 2018-10-03 ENCOUNTER — Encounter (HOSPITAL_COMMUNITY): Payer: Self-pay | Admitting: Emergency Medicine

## 2018-10-03 ENCOUNTER — Emergency Department (HOSPITAL_COMMUNITY)
Admission: EM | Admit: 2018-10-03 | Discharge: 2018-10-03 | Disposition: A | Discharge status: Home / Self Care | Payer: Medicaid Other | Attending: Emergency Medicine | Admitting: Emergency Medicine

## 2018-10-03 ENCOUNTER — Other Ambulatory Visit: Payer: Self-pay

## 2018-10-03 DIAGNOSIS — Z8547 Personal history of malignant neoplasm of testis: Secondary | ICD-10-CM | POA: Insufficient documentation

## 2018-10-03 DIAGNOSIS — Y939 Activity, unspecified: Secondary | ICD-10-CM | POA: Insufficient documentation

## 2018-10-03 DIAGNOSIS — S022XXA Fracture of nasal bones, initial encounter for closed fracture: Secondary | ICD-10-CM

## 2018-10-03 DIAGNOSIS — F17210 Nicotine dependence, cigarettes, uncomplicated: Secondary | ICD-10-CM | POA: Insufficient documentation

## 2018-10-03 DIAGNOSIS — Z5900 Homelessness unspecified: Secondary | ICD-10-CM

## 2018-10-03 DIAGNOSIS — S02641A Fracture of ramus of right mandible, initial encounter for closed fracture: Secondary | ICD-10-CM

## 2018-10-03 DIAGNOSIS — Y929 Unspecified place or not applicable: Secondary | ICD-10-CM | POA: Insufficient documentation

## 2018-10-03 DIAGNOSIS — S02602A Fracture of unspecified part of body of left mandible, initial encounter for closed fracture: Secondary | ICD-10-CM | POA: Insufficient documentation

## 2018-10-03 DIAGNOSIS — S02602D Fracture of unspecified part of body of left mandible, subsequent encounter for fracture with routine healing: Secondary | ICD-10-CM | POA: Insufficient documentation

## 2018-10-03 DIAGNOSIS — S022XXD Fracture of nasal bones, subsequent encounter for fracture with routine healing: Secondary | ICD-10-CM | POA: Insufficient documentation

## 2018-10-03 DIAGNOSIS — S02641D Fracture of ramus of right mandible, subsequent encounter for fracture with routine healing: Secondary | ICD-10-CM | POA: Insufficient documentation

## 2018-10-03 DIAGNOSIS — Y999 Unspecified external cause status: Secondary | ICD-10-CM | POA: Insufficient documentation
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
## 2018-10-03 NOTE — ED Notes (Signed)
Pt stepped into hallway again.  He seems to be fixated on making a phone call.

## 2018-10-03 NOTE — ED Notes (Signed)
Pt refusing VS at this time.
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
## 2018-10-03 NOTE — ED Notes (Signed)
ED Provider at bedside. 

## 2018-10-03 NOTE — ED Notes (Signed)
Patient has left. °

## 2018-10-03 NOTE — ED Notes (Signed)
Pt continues to come out of room looking for phone. Pt informed that he needs to stay in his room. Pt is upset that this RN is not Engineer, maintenance (IT) for him. Pt informed that this is a hospital and not a call center. Pt instructed on phone in room use. Pt informed he can use phone in lobby on discharge.

## 2018-10-03 NOTE — ED Notes (Signed)
Pt asking for help to call [employer].  This Probation officer dialed the phone for the patient.
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
## 2018-10-23 ENCOUNTER — Other Ambulatory Visit: Payer: Self-pay

## 2018-10-23 ENCOUNTER — Emergency Department (HOSPITAL_COMMUNITY)

## 2018-10-23 ENCOUNTER — Emergency Department (HOSPITAL_COMMUNITY)
Admission: EM | Admit: 2018-10-23 | Discharge: 2018-10-23 | Attending: Emergency Medicine | Admitting: Emergency Medicine

## 2018-10-23 ENCOUNTER — Encounter (HOSPITAL_COMMUNITY): Payer: Self-pay | Admitting: Emergency Medicine

## 2018-10-23 DIAGNOSIS — X58XXXD Exposure to other specified factors, subsequent encounter: Secondary | ICD-10-CM | POA: Diagnosis not present

## 2018-10-23 DIAGNOSIS — S02609D Fracture of mandible, unspecified, subsequent encounter for fracture with routine healing: Secondary | ICD-10-CM | POA: Insufficient documentation

## 2018-10-23 DIAGNOSIS — K047 Periapical abscess without sinus: Secondary | ICD-10-CM | POA: Insufficient documentation

## 2018-10-23 DIAGNOSIS — Z8547 Personal history of malignant neoplasm of testis: Secondary | ICD-10-CM | POA: Insufficient documentation

## 2018-10-23 DIAGNOSIS — F17210 Nicotine dependence, cigarettes, uncomplicated: Secondary | ICD-10-CM | POA: Diagnosis not present

## 2018-10-23 MED ORDER — CLINDAMYCIN HCL 300 MG PO CAPS: 300.0000 mg | ORAL_CAPSULE | Freq: Three times a day (TID) | ORAL | 0 refills | Status: AC

## 2018-10-23 MED ORDER — CHLORHEXIDINE GLUCONATE 0.12 % MT SOLN: 15.0000 mL | Freq: Two times a day (BID) | OROMUCOSAL | 0 refills | Status: DC
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
## 2018-10-23 NOTE — Discharge Instructions (Addendum)
Take Clindamycin as prescribed. Use mouthwash as prescribed. Follow up with ENT this week in office.
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
## 2018-11-24 ENCOUNTER — Other Ambulatory Visit: Payer: Self-pay | Admitting: Otolaryngology

## 2018-11-24 DIAGNOSIS — S02602B Fracture of unspecified part of body of left mandible, initial encounter for open fracture: Secondary | ICD-10-CM

## 2018-11-24 DIAGNOSIS — K047 Periapical abscess without sinus: Secondary | ICD-10-CM

## 2018-12-13 ENCOUNTER — Ambulatory Visit
Admission: RE | Admit: 2018-12-13 | Discharge: 2018-12-13 | Disposition: A | Discharge status: Home / Self Care | Payer: PRIVATE HEALTH INSURANCE | Source: Ambulatory Visit | Attending: Otolaryngology | Admitting: Otolaryngology

## 2018-12-13 ENCOUNTER — Other Ambulatory Visit: Payer: Self-pay

## 2018-12-13 DIAGNOSIS — S02602B Fracture of unspecified part of body of left mandible, initial encounter for open fracture: Secondary | ICD-10-CM

## 2018-12-13 DIAGNOSIS — K047 Periapical abscess without sinus: Secondary | ICD-10-CM

## 2018-12-13 MED ORDER — IOPAMIDOL (ISOVUE-300) INJECTION 61%
75.0000 mL | Freq: Once | INTRAVENOUS | Status: AC | PRN
  Administered 2018-12-13: 75 mL via INTRAVENOUS

## 2019-07-26 ENCOUNTER — Inpatient Hospital Stay (HOSPITAL_COMMUNITY): Payer: Self-pay

## 2019-07-26 ENCOUNTER — Other Ambulatory Visit (HOSPITAL_COMMUNITY): Payer: PRIVATE HEALTH INSURANCE

## 2019-07-26 ENCOUNTER — Inpatient Hospital Stay (HOSPITAL_COMMUNITY)
Admission: EM | Admit: 2019-07-26 | Discharge: 2019-08-07 | DRG: 100 | Disposition: A | Discharge status: Home / Self Care | Payer: Self-pay | Attending: Internal Medicine | Admitting: Internal Medicine

## 2019-07-26 ENCOUNTER — Emergency Department (HOSPITAL_COMMUNITY): Payer: Self-pay

## 2019-07-26 ENCOUNTER — Encounter (HOSPITAL_COMMUNITY): Payer: Self-pay

## 2019-07-26 ENCOUNTER — Other Ambulatory Visit: Payer: Self-pay

## 2019-07-26 DIAGNOSIS — Z8547 Personal history of malignant neoplasm of testis: Secondary | ICD-10-CM

## 2019-07-26 DIAGNOSIS — F17210 Nicotine dependence, cigarettes, uncomplicated: Secondary | ICD-10-CM | POA: Diagnosis present

## 2019-07-26 DIAGNOSIS — G92 Toxic encephalopathy: Secondary | ICD-10-CM | POA: Diagnosis present

## 2019-07-26 DIAGNOSIS — L03113 Cellulitis of right upper limb: Secondary | ICD-10-CM | POA: Diagnosis present

## 2019-07-26 DIAGNOSIS — D72819 Decreased white blood cell count, unspecified: Secondary | ICD-10-CM | POA: Diagnosis present

## 2019-07-26 DIAGNOSIS — F1215 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: Secondary | ICD-10-CM | POA: Diagnosis present

## 2019-07-26 DIAGNOSIS — R4701 Aphasia: Secondary | ICD-10-CM | POA: Diagnosis present

## 2019-07-26 DIAGNOSIS — I959 Hypotension, unspecified: Secondary | ICD-10-CM | POA: Diagnosis present

## 2019-07-26 DIAGNOSIS — E519 Thiamine deficiency, unspecified: Secondary | ICD-10-CM | POA: Diagnosis present

## 2019-07-26 DIAGNOSIS — I82611 Acute embolism and thrombosis of superficial veins of right upper extremity: Secondary | ICD-10-CM | POA: Diagnosis present

## 2019-07-26 DIAGNOSIS — R569 Unspecified convulsions: Secondary | ICD-10-CM

## 2019-07-26 DIAGNOSIS — F29 Unspecified psychosis not due to a substance or known physiological condition: Secondary | ICD-10-CM | POA: Diagnosis present

## 2019-07-26 DIAGNOSIS — G9340 Encephalopathy, unspecified: Secondary | ICD-10-CM | POA: Diagnosis present

## 2019-07-26 DIAGNOSIS — R7881 Bacteremia: Secondary | ICD-10-CM | POA: Diagnosis present

## 2019-07-26 DIAGNOSIS — F319 Bipolar disorder, unspecified: Secondary | ICD-10-CM | POA: Diagnosis present

## 2019-07-26 DIAGNOSIS — T801XXA Vascular complications following infusion, transfusion and therapeutic injection, initial encounter: Secondary | ICD-10-CM | POA: Diagnosis not present

## 2019-07-26 DIAGNOSIS — G40901 Epilepsy, unspecified, not intractable, with status epilepticus: Principal | ICD-10-CM | POA: Diagnosis present

## 2019-07-26 DIAGNOSIS — Z20822 Contact with and (suspected) exposure to covid-19: Secondary | ICD-10-CM | POA: Diagnosis present

## 2019-07-26 DIAGNOSIS — Z560 Unemployment, unspecified: Secondary | ICD-10-CM

## 2019-07-26 DIAGNOSIS — I808 Phlebitis and thrombophlebitis of other sites: Secondary | ICD-10-CM | POA: Diagnosis present

## 2019-07-26 DIAGNOSIS — R748 Abnormal levels of other serum enzymes: Secondary | ICD-10-CM | POA: Diagnosis present

## 2019-07-26 DIAGNOSIS — R069 Unspecified abnormalities of breathing: Secondary | ICD-10-CM

## 2019-07-26 DIAGNOSIS — Y848 Other medical procedures as the cause of abnormal reaction of the patient, or of later complication, without mention of misadventure at the time of the procedure: Secondary | ICD-10-CM | POA: Diagnosis present

## 2019-07-26 DIAGNOSIS — R4182 Altered mental status, unspecified: Secondary | ICD-10-CM | POA: Diagnosis present

## 2019-07-26 DIAGNOSIS — B182 Chronic viral hepatitis C: Secondary | ICD-10-CM | POA: Diagnosis present

## 2019-07-26 DIAGNOSIS — B9561 Methicillin susceptible Staphylococcus aureus infection as the cause of diseases classified elsewhere: Secondary | ICD-10-CM

## 2019-07-26 DIAGNOSIS — M720 Palmar fascial fibromatosis [Dupuytren]: Secondary | ICD-10-CM

## 2019-07-26 DIAGNOSIS — Z590 Homelessness: Secondary | ICD-10-CM

## 2019-07-26 DIAGNOSIS — E876 Hypokalemia: Secondary | ICD-10-CM | POA: Diagnosis present

## 2019-07-26 DIAGNOSIS — F419 Anxiety disorder, unspecified: Secondary | ICD-10-CM | POA: Diagnosis present

## 2019-07-26 DIAGNOSIS — Y92239 Unspecified place in hospital as the place of occurrence of the external cause: Secondary | ICD-10-CM | POA: Diagnosis not present

## 2019-07-26 DIAGNOSIS — F1010 Alcohol abuse, uncomplicated: Secondary | ICD-10-CM | POA: Diagnosis present

## 2019-07-26 DIAGNOSIS — R509 Fever, unspecified: Secondary | ICD-10-CM | POA: Diagnosis present

## 2019-07-26 DIAGNOSIS — F1190 Opioid use, unspecified, uncomplicated: Secondary | ICD-10-CM | POA: Diagnosis present

## 2019-07-26 LAB — CBC
HCT: 37.4 % — ABNORMAL LOW (ref 39.0–52.0)
Hemoglobin: 12.2 g/dL — ABNORMAL LOW (ref 13.0–17.0)
MCH: 32.3 pg (ref 26.0–34.0)
MCHC: 32.6 g/dL (ref 30.0–36.0)
MCV: 98.9 fL (ref 80.0–100.0)
Platelets: 254 10*3/uL (ref 150–400)
RBC: 3.78 MIL/uL — ABNORMAL LOW (ref 4.22–5.81)
RDW: 13.8 % (ref 11.5–15.5)
WBC: 5 10*3/uL (ref 4.0–10.5)
nRBC: 0 % (ref 0.0–0.2)

## 2019-07-26 LAB — COMPREHENSIVE METABOLIC PANEL
ALT: 25 U/L (ref 0–44)
AST: 35 U/L (ref 15–41)
Albumin: 2.9 g/dL — ABNORMAL LOW (ref 3.5–5.0)
Alkaline Phosphatase: 30 U/L — ABNORMAL LOW (ref 38–126)
Anion gap: 9 (ref 5–15)
BUN: 7 mg/dL — ABNORMAL LOW (ref 8–23)
CO2: 25 mmol/L (ref 22–32)
Calcium: 7.8 mg/dL — ABNORMAL LOW (ref 8.9–10.3)
Chloride: 106 mmol/L (ref 98–111)
Creatinine, Ser: 0.74 mg/dL (ref 0.61–1.24)
GFR calc Af Amer: >60 mL/min (ref >60)
GFR calc non Af Amer: >60 mL/min (ref >60)
Glucose, Bld: 115 mg/dL — ABNORMAL HIGH (ref 70–99)
Potassium: 2.8 mmol/L — ABNORMAL LOW (ref 3.5–5.1)
Sodium: 140 mmol/L (ref 135–145)
Total Bilirubin: 1 mg/dL (ref 0.3–1.2)
Total Protein: 5.1 g/dL — ABNORMAL LOW (ref 6.5–8.1)

## 2019-07-26 LAB — URINALYSIS, ROUTINE W REFLEX MICROSCOPIC
Bilirubin Urine: NEGATIVE
Glucose, UA: 50 mg/dL — AB
Hgb urine dipstick: NEGATIVE
Ketones, ur: 5 mg/dL — AB
Leukocytes,Ua: NEGATIVE
Nitrite: NEGATIVE
Protein, ur: NEGATIVE mg/dL
Specific Gravity, Urine: 1.005 (ref 1.005–1.030)
pH: 7 (ref 5.0–8.0)

## 2019-07-26 LAB — ACETAMINOPHEN LEVEL: Acetaminophen (Tylenol), Serum: <10 ug/mL — ABNORMAL LOW (ref 10–30)

## 2019-07-26 LAB — RAPID URINE DRUG SCREEN, HOSP PERFORMED
Amphetamines: NOT DETECTED
Barbiturates: NOT DETECTED
Benzodiazepines: POSITIVE — AB
Cocaine: NOT DETECTED
Opiates: NOT DETECTED
Tetrahydrocannabinol: POSITIVE — AB

## 2019-07-26 LAB — PROTIME-INR
INR: 1.1 (ref 0.8–1.2)
Prothrombin Time: 13.5 seconds (ref 11.4–15.2)

## 2019-07-26 LAB — HIV ANTIBODY (ROUTINE TESTING W REFLEX): HIV Screen 4th Generation wRfx: NONREACTIVE

## 2019-07-26 LAB — SALICYLATE LEVEL: Salicylate Lvl: <7 mg/dL — ABNORMAL LOW (ref 7.0–30.0)

## 2019-07-26 LAB — SARS CORONAVIRUS 2 BY RT PCR (HOSPITAL ORDER, PERFORMED IN ~~LOC~~ HOSPITAL LAB): SARS Coronavirus 2: NEGATIVE

## 2019-07-26 LAB — LACTIC ACID, PLASMA: Lactic Acid, Venous: 1.8 mmol/L (ref 0.5–1.9)

## 2019-07-26 LAB — CK: Total CK: 256 U/L (ref 49–397)

## 2019-07-26 LAB — PHENYTOIN LEVEL, TOTAL: Phenytoin Lvl: 18.2 ug/mL (ref 10.0–20.0)

## 2019-07-26 LAB — ETHANOL: Alcohol, Ethyl (B): <10 mg/dL (ref <10)

## 2019-07-26 MED ORDER — SODIUM CHLORIDE 0.9 % IV BOLUS
1000.0000 mL | Freq: Once | INTRAVENOUS | Status: AC
  Administered 2019-07-26: 1000 mL via INTRAVENOUS

## 2019-07-26 MED ORDER — ENOXAPARIN SODIUM 40 MG/0.4ML ~~LOC~~ SOLN
40.0000 mg | SUBCUTANEOUS | Status: DC
  Administered 2019-07-28 – 2019-08-01 (×5): 40 mg via SUBCUTANEOUS
  Filled 2019-07-26 (×5): qty 0.4

## 2019-07-26 MED ORDER — PHENYTOIN SODIUM 50 MG/ML IJ SOLN
100.0000 mg | Freq: Three times a day (TID) | INTRAMUSCULAR | Status: DC
  Administered 2019-07-27 – 2019-08-04 (×26): 100 mg via INTRAVENOUS
  Filled 2019-07-26 (×30): qty 2

## 2019-07-26 MED ORDER — LEVETIRACETAM IN NACL 1000 MG/100ML IV SOLN
1000.0000 mg | INTRAVENOUS | Status: AC
  Administered 2019-07-26: 1000 mg via INTRAVENOUS
  Filled 2019-07-26: qty 100

## 2019-07-26 MED ORDER — LORAZEPAM 2 MG/ML IJ SOLN
2.0000 mg | Freq: Once | INTRAMUSCULAR | Status: AC
  Administered 2019-07-26: 2 mg via INTRAVENOUS
  Filled 2019-07-26: qty 1

## 2019-07-26 MED ORDER — SODIUM CHLORIDE 0.9 % IV SOLN
1250.0000 mg | Freq: Once | INTRAVENOUS | Status: AC
  Administered 2019-07-26: 1250 mg via INTRAVENOUS
  Filled 2019-07-26: qty 25

## 2019-07-26 MED ORDER — POTASSIUM CHLORIDE 10 MEQ/100ML IV SOLN: 10.0000 meq | Freq: Once | INTRAVENOUS | Status: DC

## 2019-07-26 MED ORDER — LEVETIRACETAM IN NACL 500 MG/100ML IV SOLN
500.0000 mg | Freq: Two times a day (BID) | INTRAVENOUS | Status: DC
  Administered 2019-07-27 – 2019-08-05 (×20): 500 mg via INTRAVENOUS
  Filled 2019-07-26 (×22): qty 100

## 2019-07-26 MED ORDER — LEVETIRACETAM IN NACL 1000 MG/100ML IV SOLN
1000.0000 mg | Freq: Once | INTRAVENOUS | Status: AC
  Administered 2019-07-26: 1000 mg via INTRAVENOUS
  Filled 2019-07-26: qty 100

## 2019-07-26 MED ORDER — SODIUM CHLORIDE 0.9 % IV SOLN: 2000.0000 mg | Freq: Once | INTRAVENOUS | Status: DC

## 2019-07-26 MED ORDER — POTASSIUM CHLORIDE 10 MEQ/100ML IV SOLN
10.0000 meq | INTRAVENOUS | Status: AC
  Administered 2019-07-26 (×5): 10 meq via INTRAVENOUS
  Filled 2019-07-26 (×5): qty 100

## 2019-07-26 NOTE — ED Notes (Signed)
Pt only got 2 g of Kepra IV for seizures as ordered by provider.

## 2019-07-26 NOTE — Progress Notes (Addendum)
Addendum:  - Total phenytoin level obtained ~ 2 hr post LD was within upper end of goal range will continue with 100 mg TID starting in the AM   Phenytoin Initial Consult Indication: Status   No Known Allergies  Patient Measurements:     There is no height or weight on file to calculate BMI.   Vital signs: Temp: 98 F (36.7 C) (07/15 1051) Temp Source: Oral (07/15 1051) BP: 91/68 (07/15 1804) Pulse Rate: 83 (07/15 1804)  Labs: Lab Results  Component Value Date/Time   Albumin 2.9 (L) 07/26/2019 1105   No results found for: PHENYTOIN, PHENOBARB, VALPROATE, CBMZ CrCl cannot be calculated (Unknown ideal weight.).   Medications:  Scheduled:  . [START ON 07/27/2019] enoxaparin (LOVENOX) injection  40 mg Subcutaneous Q24H  . [START ON 07/27/2019] phenytoin (DILANTIN) IV  100 mg Intravenous Q8H    Assessment: Corrected phenytoin level (if needed):  Seizure activity: Status eeg pending  Significant potential drug interactions: N/a  Goals of care:  Total phenytoin level: 10-20 mcg/ml Free phenytoin level: 1-2 mcg/ml  Plan:  1250 mg IV x1 Max infusion rate 50 mg/min In two hours following completion of IV loading dose  Anticipated maintenance dose: 100 mg IV TID  Pharmacy will continue to follow regarding obtaining total phenytoin levels and dose adjustments as indicated.   Duanne Limerick PharmD. BCPS  07/26/2019,6:24 PM

## 2019-07-26 NOTE — ED Notes (Signed)
Pt is not able to follow any command for neurologic assessment.

## 2019-07-26 NOTE — ED Notes (Signed)
MD  [place] Paged systolic in 53'Y. Manual obtained 82/50.

## 2019-07-26 NOTE — Consult Note (Addendum)
Neurology Consultation  Reason for Consult: Seizure Referring Physician: Dr. Gerlene Fee  CC: Seizures  History is obtained from: Chart  HPI: Andrew Chaney is a 64 y.o. male with history of bipolar 1 disorder, germ cell seminoma cancer, renal disorder listed in the chart.  Patient is currently homeless.  At current time patient is extremely altered and moaning.  No history can be obtained from patient.  Per chart patient presented to the ED with chief complaint of seizure.  Patient was reported to have seizures by EMS and given Ativan prior to arrival.  On consultation patient is awake, will track practitioner in the room, will follow visual commands but he is not verbally coherent and majority time is moaning.  At one point time patient did have a episode in which he had eye deviation to the right and head turning to the right however when his name was called on the left side he quickly stopped and turned to look to the left.  Past Medical History:  Diagnosis Date  . Bipolar 1 disorder ([place])   . Cancer [facility]) 2011   Germ Cell Seminoma   . Renal disorder     History reviewed. No pertinent family history.  unable to obtain from patient as he is with altered mental status  Social History:   reports that he has been smoking cigarettes. He has never used smokeless tobacco. He reports current alcohol use. He reports current drug use. Drug: Marijuana.  Medications  Current Facility-Administered Medications:  .  levETIRAcetam (KEPPRA) IVPB 1000 mg/100 mL premix, 1,000 mg, Intravenous, Once, Maudie Flakes, MD .  levETIRAcetam (KEPPRA) IVPB 1000 mg/100 mL premix, 1,000 mg, Intravenous, Once, Maudie Flakes, MD  Current Outpatient Medications:  .  benztropine (COGENTIN) 0.5 MG tablet, Take 1 tablet (0.5 mg total) by mouth 2 (two) times daily. (Patient not taking: Reported on 10/03/2018), Disp: 60 tablet, Rfl: 0 .  chlorhexidine (PERIDEX) 0.12 % solution, Use as directed 15 mLs in the mouth  or throat 2 (two) times daily., Disp: 120 mL, Rfl: 0 .  hydrOXYzine (ATARAX/VISTARIL) 50 MG tablet, Take 1 tablet (50 mg total) by mouth 3 (three) times daily as needed for anxiety. (Patient not taking: Reported on 10/03/2018), Disp: 30 tablet, Rfl: 0 .  risperiDONE (RISPERDAL) 2 MG tablet, Take 1 tablet (2 mg total) by mouth 2 (two) times daily. (Patient not taking: Reported on 10/03/2018), Disp: 30 tablet, Rfl: 0 .  temazepam (RESTORIL) 30 MG capsule, Take 1 capsule (30 mg total) by mouth at bedtime. (Patient not taking: Reported on 10/03/2018), Disp: 30 capsule, Rfl: 0 .  traZODone (DESYREL) 100 MG tablet, Take 1 tablet (100 mg total) by mouth at bedtime. (Patient not taking: Reported on 10/03/2018), Disp: 30 tablet, Rfl: 0  ROS:  Unable to obtain due to altered mental status.   Exam: Current vital signs: BP 120/82   Pulse 80   Temp 98 F (36.7 C) (Oral)   Resp 15   SpO2 100%  Vital signs in last 24 hours: Temp:  [98 F (36.7 C)] 98 F (36.7 C) (07/15 1051) Pulse Rate:  [73-80] 80 (07/15 1328) Resp:  [15] 15 (07/15 1051) BP: (120)/(82) 120/82 (07/15 1328) SpO2:  [97 %-100 %] 100 % (07/15 1328) Constitutional: Appears malnourished.  Psych: Affect not appropriate for situation Eyes: No scleral injection HENT: No OP obstrucion Head: Normocephalic.  Cardiovascular: Normal rate and regular rhythm.  Respiratory: Effort normal, non-labored breathing GI: Soft.  No distension. There is no  tenderness.  Skin: WDI  Neuro: Mental Status: Patient is awake, alert.  At times will follow visual commands however will not follow verbal commands.  Majority time patient is moaning in the room.  At present time not showing any clinical seizures.  No history is able to be obtained from patient. Cranial Nerves: II: Blinks to threat bilaterally III,IV, VI: EOMI without ptosis or diploplia. Pupils equal, round and reactive to light V: Facial sensation appears to be intact VII: Facial movement is  symmetric.  VIII: hearing is intact to voice XII: tongue is midline when mouth is opened during his moaning spells.  Motor: Moves all extremities antigravity.  At one point when arms were held up he left them up in a catatonic-like action. Sensory: Withdraws from pain in all extremities Deep Tendon Reflexes: 2+ and symmetric in the biceps and patellae.  Plantars: Toes are downgoing bilaterally.  Cerebellar: Unable to be obtained secondary to mental status  Labs I have reviewed labs in epic and the results pertinent to this consultation are:   CBC    Component Value Date/Time   WBC 5.0 07/26/2019 1105   RBC 3.78 (L) 07/26/2019 1105   HGB 12.2 (L) 07/26/2019 1105   HCT 37.4 (L) 07/26/2019 1105   PLT 254 07/26/2019 1105   MCV 98.9 07/26/2019 1105   MCH 32.3 07/26/2019 1105   MCHC 32.6 07/26/2019 1105   RDW 13.8 07/26/2019 1105   LYMPHSABS 1.1 09/10/2018 1814   MONOABS 0.8 09/10/2018 1814   EOSABS 0.0 09/10/2018 1814   BASOSABS 0.0 09/10/2018 1814    CMP     Component Value Date/Time   NA 140 07/26/2019 1105   K 2.8 (L) 07/26/2019 1105   CL 106 07/26/2019 1105   CO2 25 07/26/2019 1105   GLUCOSE 115 (H) 07/26/2019 1105   BUN 7 (L) 07/26/2019 1105   CREATININE 0.74 07/26/2019 1105   CALCIUM 7.8 (L) 07/26/2019 1105   PROT 5.1 (L) 07/26/2019 1105   ALBUMIN 2.9 (L) 07/26/2019 1105   AST 35 07/26/2019 1105   ALT 25 07/26/2019 1105   ALKPHOS 30 (L) 07/26/2019 1105   BILITOT 1.0 07/26/2019 1105   GFRNONAA >60 07/26/2019 1105   GFRAA >60 07/26/2019 1105    Lipid Panel  No results found for: CHOL, TRIG, HDL, CHOLHDL, VLDL, LDLCALC, LDLDIRECT   Imaging I have reviewed the images obtained:  CT-scan of the brain-low-density medially within the anterior left frontal lobe concerning for acute infarct  Etta Quill PA-C [facility] [phone]  M-F  (9:00 am- 5:00 PM)  07/26/2019, 4:05 PM     Assessment:  This is a 64 year old male who apparently is  homeless brought to the hospital by EMS secondary to visualize 2 seizures.  While in the emergency room patient had another seizure which was described as tonic-clonic.  On consultation patient is alert however moaning and only follows visual commands.  As noted in his history he does have a psych history and is on multiple psych meds.  At current time differential would include possible psychiatric breakthrough versus seizure and will need MRI to further evaluate for stroke.  Impression: -Altered mental status -Seizure -Possible CVA  Recommendations: -Keppra 2 g load now which has been given -EEG -MRI brain without contrast -We will make further recommendations after diagnostic tests have been obtained   Attending addendum Patient seen and examined for concern for altered mental status after having a 2 seizures. He had a third seizure in the emergency room.  Not able to provide any history On examination, he opens his eyes, does not really follow commands unless we raise his arm and he keeps that raised, and follows/tracks the examiner from one side to the other.  Had an episode of right gaze version and vocalization during the exam which broke without any medication-called his name a few times and he looked back towards the examiner. CT head in the ER-low-density medially within the anterior left frontal lobe concerning for infarct read by radiology.  Unsure if that is a true finding or artifact.  Needs follow-up by MRI. Stat EEG was obtained-discussed with Dr. Silverio Decamp left-sided seizures.  Assessment and recommendations -64 year old man with past medical history of alcohol use and concern for withdrawal, presenting with multiple seizures today without return to baseline. Technically this is status epilepticus.  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 He was loaded with 1 g of Keppra IV by the ED provider.   We recommended another gram of Keppra load while waiting for the EEG and another gram after the EEG showed status.  Recommendations: -Load with fosphenytoin 20 mg/kg phenytoin equivalent.(ordered) -Check Dilantin level in 2 hours after the load -Continue Keppra 500 twice daily -Continue Dilantin 100 3 times daily -LTM EEG-he has been hooked up with MRI safe leads, will plan on getting MRI sometime tomorrow when the technologists are in house in case of any questions about the leads. -He has no evidence of infection, white count, neck stiffness for me to consider spinal tap at this time. -If he continues to have seizure activity and MRI is unremarkable, will consider a spinal tap at that time.  Discussed w/ Dr. Sedonia Small in ER.  -- Amie [name], MD [facility] Pager: [phone] If 7pm to 7am, please call on call as listed on AMION.   CRITICAL CARE ATTESTATION Performed by: Amie [name], MD Total critical care time: 60 minutes Critical care time was exclusive of separately billable procedures and treating other patients and/or supervising APPs/Residents/Students Critical care was necessary to treat or prevent imminent or life-threatening deterioration due to status epilepticus This patient is critically ill and at significant risk for neurological worsening and/or death and care requires constant monitoring. Critical care was time spent personally by me on the following activities: development of treatment plan with patient and/or surrogate as well as nursing, discussions with consultants, evaluation of patient's response to treatment, examination of patient, obtaining history from patient or surrogate, ordering and performing treatments and interventions, ordering and review of laboratory studies, ordering and review of radiographic studies, pulse oximetry, re-evaluation of patient's condition, participation in multidisciplinary rounds and medical decision making of high complexity  in the care of this patient.

## 2019-07-26 NOTE — ED Triage Notes (Signed)
PT BIB EMS due to 2 seizures.Per EMS pt had 2 seizures and 1 versed was given. Pt is homeless and the homeless shelter states patient has had a decline health over the last couple of weeks. Possible ETOH.

## 2019-07-26 NOTE — Progress Notes (Signed)
STAT EEG completed; results pending. Dr Yadav notified. 

## 2019-07-26 NOTE — Progress Notes (Signed)
LTM set up with new leads (MRI leads); no initial skin breakdown was seen.

## 2019-07-26 NOTE — ED Provider Notes (Signed)
[facility] Emergency Department Provider Note MRN:  [id number]  Arrival date & time: 07/26/19     Chief Complaint   Seizures   History of Present Illness   Andrew Chaney is a 64 y.o. year-old male with a history of bipolar disorder, homelessness presenting to the ED with chief complaint of seizure.  2 seizures reported by EMS, given Ativan prior to arrival.  Patient is somnolent, minimally responsive.  I was unable to obtain an accurate HPI, PMH, or ROS due to the patient's altered mental status.  Level 5 caveat.  Review of Systems  A complete 10 system review of systems was obtained and all systems are negative except as noted in the HPI and PMH.   Patient's Health History    Past Medical History:  Diagnosis Date  . Bipolar 1 disorder ([place])   . Cancer [facility]) 2011   Germ Cell Seminoma   . Renal disorder     History reviewed. No pertinent surgical history.  History reviewed. No pertinent family history.  Social History   Socioeconomic History  . Marital status: Single    Spouse name: Not on file  . Number of children: Not on file  . Years of education: Not on file  . Highest education level: Not on file  Occupational History  . Not on file  Tobacco Use  . Smoking status: Current Every Day Smoker    Types: Cigarettes  . Smokeless tobacco: Never Used  Substance and Sexual Activity  . Alcohol use: Yes  . Drug use: Yes    Types: Marijuana    Comment: States no longer uses marijuana because he is "on probation"  . Sexual activity: Not Currently  Other Topics Concern  . Not on file  Social History Narrative  . Not on file   Social Determinants of Health   Financial Resource Strain:   . Difficulty of Paying Living Expenses:   Food Insecurity:   . Worried About Charity fundraiser in the Last Year:   . Arboriculturist in the Last Year:   Transportation Needs:   . Film/video editor (Medical):   Marland Kitchen Lack of Transportation  (Non-Medical):   Physical Activity:   . Days of Exercise per Week:   . Minutes of Exercise per Session:   Stress:   . Feeling of Stress :   Social Connections:   . Frequency of Communication with Friends and Family:   . Frequency of Social Gatherings with Friends and Family:   . Attends Religious Services:   . Active Member of Clubs or Organizations:   . Attends Archivist Meetings:   Marland Kitchen Marital Status:   Intimate Partner Violence:   . Fear of Current or Ex-Partner:   . Emotionally Abused:   Marland Kitchen Physically Abused:   . Sexually Abused:      Physical Exam   Vitals:   07/26/19 1051 07/26/19 1328  BP:  120/82  Pulse: 73 80  Resp: 15   Temp: 98 F (36.7 C)   SpO2: 97% 100%    CONSTITUTIONAL: Chronically ill-appearing, NAD NEURO: Somnolent, opens eyes and makes purposeful movements to pain EYES:  eyes equal and reactive ENT/NECK:  no LAD, no JVD CARDIO: Regular rate, well-perfused, normal S1 and S2 PULM:  CTAB no wheezing or rhonchi GI/GU:  normal bowel sounds, non-distended, non-tender MSK/SPINE:  No gross deformities, no edema SKIN: Right periorbital bruising PSYCH: Unable to assess  *Additional and/or pertinent findings included in MDM  below  Diagnostic and Interventional Summary    EKG Interpretation  Date/Time:  Thursday July 26 2019 10:51:06 EDT Ventricular Rate:  67 PR Interval:    QRS Duration: 115 QT Interval:  448 QTC Calculation: 473 R Axis:   -64 Text Interpretation: Sinus rhythm Left anterior fascicular block ST elevation, consider inferior injury Confirmed by Gerlene Fee [id number]) on 07/26/2019 10:56:53 AM      Labs Reviewed  CBC - Abnormal; Notable for the following components:      Result Value   RBC 3.78 (*)    Hemoglobin 12.2 (*)    HCT 37.4 (*)    All other components within normal limits  COMPREHENSIVE METABOLIC PANEL - Abnormal; Notable for the following components:   Potassium 2.8 (*)    Glucose, Bld 115 (*)    BUN 7 (*)     Calcium 7.8 (*)    Total Protein 5.1 (*)    Albumin 2.9 (*)    Alkaline Phosphatase 30 (*)    All other components within normal limits  URINALYSIS, ROUTINE W REFLEX MICROSCOPIC - Abnormal; Notable for the following components:   Color, Urine STRAW (*)    Glucose, UA 50 (*)    Ketones, ur 5 (*)    All other components within normal limits  RAPID URINE DRUG SCREEN, HOSP PERFORMED - Abnormal; Notable for the following components:   Benzodiazepines POSITIVE (*)    Tetrahydrocannabinol POSITIVE (*)    All other components within normal limits  SALICYLATE LEVEL - Abnormal; Notable for the following components:   Salicylate Lvl <5.8 (*)    All other components within normal limits  ACETAMINOPHEN LEVEL - Abnormal; Notable for the following components:   Acetaminophen (Tylenol), Serum <10 (*)    All other components within normal limits  LACTIC ACID, PLASMA  CK  ETHANOL  PROTIME-INR    DG Abdomen 1 View  Final Result    CT HEAD WO CONTRAST  Final Result    CT CERVICAL SPINE WO CONTRAST  Final Result    DG Chest Port 1 View  Final Result    MR BRAIN WO CONTRAST    (Results Pending)    Medications  levETIRAcetam (KEPPRA) IVPB 1000 mg/100 mL premix (has no administration in time range)  levETIRAcetam (KEPPRA) IVPB 1000 mg/100 mL premix (has no administration in time range)  sodium chloride 0.9 % bolus 1,000 mL (1,000 mLs Intravenous New Bag/Given 07/26/19 1138)     Procedures  /  Critical Care .Critical Care Performed by: Maudie Flakes, MD Authorized by: Maudie Flakes, MD   Critical care provider statement:    Critical care time (minutes):  45   Critical care was necessary to treat or prevent imminent or life-threatening deterioration of the following conditions: Concern for status epilepticus.   Critical care was time spent personally by me on the following activities:  Discussions with consultants, evaluation of patient's response to treatment, examination of patient,  ordering and performing treatments and interventions, ordering and review of laboratory studies, ordering and review of radiographic studies, pulse oximetry, re-evaluation of patient's condition, obtaining history from patient or surrogate and review of old charts    ED Course and Medical Decision Making  I have reviewed the triage vital signs, the nursing notes, and pertinent available records from the EMR.  Listed above are laboratory and imaging tests that I personally ordered, reviewed, and interpreted and then considered in my medical decision making (see below for details).  Considering post ictal state, ethanol abuse, metabolic disarray, intracranial bleeding.  Currently protecting airway with normal vital signs, makes purposeful movements, doubt that he is in nonconvulsive status.  Work-up pending.  On my repeat evaluation patient is more alert but he seems aphasic, unknown last known normal, long history of psychogenic phenomenon.  Discussed with neurology, who recommends MRI of the brain to further evaluate the CT evidence of possible infarct, though it appears this finding was present on prior CT.  Patient has had repeat seizure activity here in the emergency department, neurology formally consulted, will load with Keppra.  No signs of meningitis, afebrile, neurology recommending hospital stepdown admission for further evaluation.  Barth Kirks. Sedonia Small, [place] [email or web address]  Final Clinical Impressions(s) / ED Diagnoses     ICD-10-CM   1. Seizure ([place])  R56.9   2. Altered mental status, unspecified altered mental status type  R41.82     ED Discharge Orders    None       Discharge Instructions Discussed with and Provided to Patient:   Discharge Instructions   None       Maudie Flakes, MD 07/26/19 1540

## 2019-07-26 NOTE — Procedures (Signed)
Patient Name: Andrew Chaney  MRN: [id number]  Epilepsy Attending: Lora Havens  Referring Physician/Provider: Dr Adaline Sill Date: 07/26/2019 Duration: 24.46 mins  Patient history: 64 year old male brought to the hospital by EMS secondary to visualize 2 seizures.  While in the emergency room patient had another seizure which was described as tonic-clonic. EEG to evaluate for seizure  Level of alertness:  lethargic  AEDs during EEG study: LEV, ativan  Technical aspects: This EEG study was done with scalp electrodes positioned according to the 10-20 International system of electrode placement. Electrical activity was acquired at a sampling rate of 500Hz  and reviewed with a high frequency filter of 70Hz  and a low frequency filter of 1Hz . EEG data were recorded continuously and digitally stored.   Description: No clear posterior dominant rhythm was seen. Sleep was characterized by sleep spindles (12 to 14 Hz), maximal frontocentral region. Eight seizures were also recorded during this study, avg 20-40 seconds, during which patient continued to be confused and not answering questions. Concomitant eeg showed 4-5hz  rhythmic sharply contoured theta slowing in left frontotemporal region which evolved to involve left hemisphere followed by 2-3hz  delta slowing. Last seizure was at 1635. EEG also showed intermittent 3 to 6 Hz theta-delta slowing in left frontotemporal region.  Hyperventilation and photic stimulation were not performed.     ABNORMALITY - Seizure, left frontotemporal region -Intermittent slow, left frontotemporal region  IMPRESSION: This study showed eight seizures arising from left frontotemporal region, last seizure at 1635, avg lasting 20-40 seconds during which patient was noted to be confused and not answering questions. Additionally, there is evidence of cortical dysfunction in left frontotemporal region.   Dr Rory Percy was immediately notified.    [name]

## 2019-07-26 NOTE — H&P (Signed)
Date: 07/26/2019               Patient Name:  Andrew Chaney MRN: [id number]  DOB: 1955/06/30 Age / Sex: 64 y.o., male   PCP: Patient, No Pcp Per         Medical Service: Internal Medicine Teaching Service         Attending Physician: Dr. Sedonia Small, Barth Kirks, MD    First Contact: Dr. Bridgett Larsson Pager: [phone]   Second Contact: Dr. Eli Phillips Pager: [phone]       After Hours (After 5p/  First Contact Pager: [phone]  weekends / holidays): Second Contact Pager: [phone]   Chief Complaint: seizures  History of Present Illness: 64 y/o M with hx of bipolar 1 disorder, depression, homelessness presenting to the ED from homeless shelter vis EMS with seizures, 2 witnessed by EMS. Ativan given prior to arrival. Patient remains somnolent, responsive to painful stimuli only. Unable to obtain further history due to AMS.   ED Course: Loaded with Keppra 2g.  CT head with low density medially within the anterior frontal lobe concerning for acute infarct, may be due to artifact. CT spine with degenerative changes.   Neurology consulted, pending MRI and long-term EEG.  Meds: unable to obtain due to AMS No outpatient medications have been marked as taking for the 07/26/19 encounter [facility] Encounter).   Allergies: Allergies as of 07/26/2019  . (No Known Allergies)   Past Medical History:  Diagnosis Date  . Bipolar 1 disorder (Melbeta)   . 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 Renal disorder     Family History: unable to obtain due to AMS  Social History:  Social History   Tobacco Use  . Smoking status: Current Every Day Smoker    Types: Cigarettes  . Smokeless tobacco: Never Used  Substance Use Topics  . Alcohol use: Yes  . Drug use: Yes    Types: Marijuana    Comment: States no longer uses marijuana because he is "on probation"    Review of Systems: unable to obtain due to AMS  Physical Exam: Blood pressure 120/82, pulse 80, temperature 98 F (36.7 C), temperature source  Oral, resp. rate 15, SpO2 100 %. Physical Exam Constitutional:      Comments: Somnolent Responds to painful stimuli only  HENT:     Head: Atraumatic.     Right Ear: External ear normal.     Left Ear: External ear normal.     Nose: Nose normal.     Mouth/Throat:     Mouth: Mucous membranes are moist.     Comments: No blood in mouth, no lesions noted to tongue Eyes:     Conjunctiva/sclera: Conjunctivae normal.     Pupils: Pupils are equal, round, and reactive to light.     Comments: Pupils 46mm and minimally reactive  Cardiovascular:     Rate and Rhythm: Normal rate and regular rhythm.     Pulses: Normal pulses.  Pulmonary:     Effort: Pulmonary effort is normal. No respiratory distress.     Breath sounds: Normal breath sounds. No wheezing, rhonchi or rales.  Abdominal:     General: Abdomen is flat. There is no distension.     Palpations: Abdomen is soft.  Musculoskeletal:        General: No deformity.  Skin:    General: Skin is warm and dry.     Capillary Refill: Capillary refill takes less than 2 seconds.  Neurological:  Comments: Unable to assess      EKG: personally reviewed my interpretation is sinus rhythm with low amplitude  CXR: personally reviewed my interpretation is no acute processes  CBC Latest Ref Rng & Units 07/26/2019 09/10/2018 09/03/2018  WBC 4.0 - 10.5 K/uL 5.0 8.1 5.6  Hemoglobin 13.0 - 17.0 g/dL 12.2(L) 10.6(L) 11.6(L)  Hematocrit 39 - 52 % 37.4(L) 32.7(L) 35.0(L)  Platelets 150 - 400 K/uL 254 193 221    Assessment & Plan by Problem: Andrew Chaney is a 64 y/o gentleman with hx of depression, bipolar 1, psychosis, homelessness presented with acute encephalopathy, witnessed seizure-like episodes by EMS and ED staff. He is unable to give further history.   Active Problems:   Acute encephalopathy   Seizure ([place])  Status epilepticus Hx of depression, bipolar 1, psychosis, homelessness. EMS called to homeless shelter for [place]. Unclear what his  baseline is. Seizure-like activity witnessed by EMS and ED staff. CT head with low density medially within the anterior frontal lobe concerning for acute infarct. Ethanol level 0. Lactic acid 1.8. K 2.8. CO2 25.   DDx: post ictal state vs metabolic disorder vs substance overdose -f/u MRI brain to be obtained in the morning -f/u EEG     -repeated R-sided seizures noted at this time -loaded with 3000mg  Kepprea, 500mg  IV every 15 minutes per neuro -phenytoin 100 mg q8 hours, per neuro -check phenytoin level when infusion complete ~0900 -no LP for now, not concerning for infection -frequent neuro checks -neurology following, appreciate recs  Dispo: Admit patient to Inpatient with expected length of stay greater than 2 midnights.  Signed: Andrew Au, MD 07/26/2019, 4:47 PM  Pager: [phone]  After 5pm on weekdays and 1pm on weekends: On Call pager: [phone]

## 2019-07-27 ENCOUNTER — Inpatient Hospital Stay (HOSPITAL_COMMUNITY): Payer: Self-pay

## 2019-07-27 DIAGNOSIS — E861 Hypovolemia: Secondary | ICD-10-CM

## 2019-07-27 DIAGNOSIS — R569 Unspecified convulsions: Secondary | ICD-10-CM

## 2019-07-27 DIAGNOSIS — G9340 Encephalopathy, unspecified: Secondary | ICD-10-CM

## 2019-07-27 DIAGNOSIS — I9589 Other hypotension: Secondary | ICD-10-CM

## 2019-07-27 LAB — TSH: TSH: 1.898 u[IU]/mL (ref 0.350–4.500)

## 2019-07-27 LAB — COMPREHENSIVE METABOLIC PANEL
ALT: 19 U/L (ref 0–44)
AST: 53 U/L — ABNORMAL HIGH (ref 15–41)
Albumin: 2.6 g/dL — ABNORMAL LOW (ref 3.5–5.0)
Alkaline Phosphatase: 28 U/L — ABNORMAL LOW (ref 38–126)
Anion gap: 7 (ref 5–15)
BUN: <5 mg/dL — ABNORMAL LOW (ref 8–23)
CO2: 25 mmol/L (ref 22–32)
Calcium: 7.9 mg/dL — ABNORMAL LOW (ref 8.9–10.3)
Chloride: 107 mmol/L (ref 98–111)
Creatinine, Ser: 0.6 mg/dL — ABNORMAL LOW (ref 0.61–1.24)
GFR calc Af Amer: >60 mL/min (ref >60)
GFR calc non Af Amer: >60 mL/min (ref >60)
Glucose, Bld: 90 mg/dL (ref 70–99)
Potassium: 4.6 mmol/L (ref 3.5–5.1)
Sodium: 139 mmol/L (ref 135–145)
Total Bilirubin: 1.8 mg/dL — ABNORMAL HIGH (ref 0.3–1.2)
Total Protein: 4.6 g/dL — ABNORMAL LOW (ref 6.5–8.1)

## 2019-07-27 LAB — MAGNESIUM: Magnesium: 1.3 mg/dL — ABNORMAL LOW (ref 1.7–2.4)

## 2019-07-27 LAB — PHOSPHORUS: Phosphorus: 1.8 mg/dL — ABNORMAL LOW (ref 2.5–4.6)

## 2019-07-27 LAB — CBC
HCT: 33.2 % — ABNORMAL LOW (ref 39.0–52.0)
Hemoglobin: 10.7 g/dL — ABNORMAL LOW (ref 13.0–17.0)
MCH: 32.2 pg (ref 26.0–34.0)
MCHC: 32.2 g/dL (ref 30.0–36.0)
MCV: 100 fL (ref 80.0–100.0)
Platelets: 248 10*3/uL (ref 150–400)
RBC: 3.32 MIL/uL — ABNORMAL LOW (ref 4.22–5.81)
RDW: 14.1 % (ref 11.5–15.5)
WBC: 7.1 10*3/uL (ref 4.0–10.5)
nRBC: 0 % (ref 0.0–0.2)

## 2019-07-27 LAB — CORTISOL: Cortisol, Plasma: 11.3 ug/dL

## 2019-07-27 LAB — MRSA PCR SCREENING: MRSA by PCR: NEGATIVE

## 2019-07-27 MED ORDER — MIDODRINE HCL 5 MG PO TABS
10.0000 mg | ORAL_TABLET | Freq: Three times a day (TID) | ORAL | Status: DC
  Administered 2019-07-27 – 2019-08-07 (×35): 10 mg via ORAL
  Filled 2019-07-27 (×36): qty 2

## 2019-07-27 MED ORDER — NOREPINEPHRINE 4 MG/250ML-% IV SOLN: 2.0000 ug/min | INTRAVENOUS | Status: DC

## 2019-07-27 MED ORDER — LACTATED RINGERS IV SOLN: INTRAVENOUS | Status: DC

## 2019-07-27 MED ORDER — SODIUM CHLORIDE 0.9 % IV SOLN
250.0000 mL | INTRAVENOUS | Status: DC
  Administered 2019-07-27 – 2019-08-01 (×2): 250 mL via INTRAVENOUS

## 2019-07-27 MED ORDER — LACTATED RINGERS IV BOLUS
1000.0000 mL | Freq: Once | INTRAVENOUS | Status: AC
  Administered 2019-07-27: 1000 mL via INTRAVENOUS

## 2019-07-27 MED ORDER — SODIUM CHLORIDE 0.9 % IV BOLUS
1000.0000 mL | Freq: Once | INTRAVENOUS | Status: AC
  Administered 2019-07-27: 1000 mL via INTRAVENOUS

## 2019-07-27 NOTE — Progress Notes (Addendum)
NAME:  Andrew Chaney, MRN:  [id number], DOB:  02-Nov-1955, LOS: 1 ADMISSION DATE:  07/26/2019, CONSULTATION DATE: 07/27/2019 REFERRING MD: Dr. Oralia Manis, CHIEF COMPLAINT: Hypertension  Brief History   64 year old male presented with status epilepticus, treated with multiple antibiotics in ED with resolution of seizures however later developed persistent hypotension for which PCCM was consulted  History of present illness   Andrew Chaney is a 64 year old male with a past medical history significant for bipolar 1 disease, germ cell seroma, homelessness, and medical renal disease with witnessed seizure activity at homeless shelter where he resides.  Per chart EMS witnessed 2 seizures on arrival. Patient received Ativan by EMS and transported to ED.   Vital signs on arrival relatively unremarkable.  Lab work significant for hypokalemia with potassium of 2.8 and slightly elevated alkaline phosphatase. Urine drug screen positive for benzodiazepines and THC on admission.  It appears on assessment per neurology patient was seen in status epilepticus for which he received fosphenytoin, Dilantin, Keppra, and as needed pushes of Ativan with resolution of seizure seen.  Patient is currently on long-term EEG with MRI brain pending.  Patient initially admitted per internal medicine teaching service.  While in the emergency department patient has intermittently been seen with hypotension for which she received 3 L of IV fluid.  Internal medicine teaching service reached out to PCCM for further management of hypotension and possible need of pressors  Past Medical History  Renal disease Germ cell seroma 2011 Bipolar Homelessness EtOH abuse  Significant Hospital Events   Admitted 7/16 and status epilepticus  Consults:  Neurology  Procedures:  Long-term EEG 7/16  Significant Diagnostic Tests:  Cervical spine/head CT 7/16 > Low-density medially within the anterior left frontal lobe concerning for acute  infarct.  Degenerative disc and facet disease throughout the cervical spine. No acute bony abnormality.    Micro Data:  Covid 7/15 > negative  Antimicrobials:     Interim history/subjective:  Awake alert no acute distress hemodynamically stable  Objective   Blood pressure 120/76, pulse 70, temperature 98 F (36.7 C), temperature source Oral, resp. rate (!) 21, SpO2 99 %.        Intake/Output Summary (Last 24 hours) at 07/27/2019 1119 Last data filed at 07/27/2019 1540 Gross per 24 hour  Intake 3805.99 ml  Output 900 ml  Net 2905.99 ml   There were no vitals filed for this visit.  Examination: General: Chronic ill-appearing middle-aged gentleman lying on ED stretcher in no acute distress HEENT: [place]/AT, MM pink/moist, PERRL sclera nonicteric  Neuro: Alert and oriented x3, slightly drowsy, nonfocal CV: s1s2 regular rate and rhythm, no murmur, rubs, or gallops,  PULM: Clear to auscultation bilaterally no increased work of breathing GI: soft, bowel sounds active in all 4 quadrants, non-tender, non-distended Extremities: warm/dry, non pitting lower extremity edema  Skin: no rashes or lesions  Resolved Hospital Problem list     Assessment & Plan:  Hypotension -Etiology unknown, no clear evidence of infection or sepsis.  Did receive multiple antiepileptics but this was earlier in the evening. P: Cortisol levels 11.3 TSH is normal I suspect he was volume under loaded. He is off vasopressor support Currently hemodynamically stable Currently on midodrine 2D echo is pending Questionable if he needs ICU at this time may be able to go to a stepdown unit.    Status epilepticus -Patient seen with seizure activity at homeless shelter and on arrival to ED.  Patient does report chronic alcohol abuse but denies any withdrawal seizures  in the past. P: Currently with a EEG in place Neurology is managing antiseizure medications No need for tap Appreciate neurology's  involvement   Best practice:  Diet: N.p.o. Pain/Anxiety/Delirium protocol (if indicated): As needed VAP protocol (if indicated): Not applicable DVT prophylaxis: Subcu heparin GI prophylaxis: PPI Glucose control: Monitor Mobility: Bedrest Code Status: Full code Family Communication: Per primary Disposition: ICU  Labs   CBC: Recent Labs  Lab 07/26/19 1105 07/27/19 0508  WBC 5.0 7.1  HGB 12.2* 10.7*  HCT 37.4* 33.2*  MCV 98.9 100.0  PLT 254 846    Basic Metabolic Panel: Recent Labs  Lab 07/26/19 1105 07/27/19 0508  NA 140 139  K 2.8* 4.6  CL 106 107  CO2 25 25  GLUCOSE 115* 90  BUN 7* <5*  CREATININE 0.74 0.60*  CALCIUM 7.8* 7.9*  MG  --  1.3*  PHOS  --  1.8*   GFR: CrCl cannot be calculated (Unknown ideal weight.). Recent Labs  Lab 07/26/19 1105 07/27/19 0508  WBC 5.0 7.1  LATICACIDVEN 1.8  --     Liver Function Tests: Recent Labs  Lab 07/26/19 1105 07/27/19 0508  AST 35 53*  ALT 25 19  ALKPHOS 30* 28*  BILITOT 1.0 1.8*  PROT 5.1* 4.6*  ALBUMIN 2.9* 2.6*   No results for input(s): LIPASE, AMYLASE in the last 168 hours. No results for input(s): AMMONIA in the last 168 hours.  ABG No results found for: PHART, PCO2ART, PO2ART, HCO3, TCO2, ACIDBASEDEF, O2SAT   Coagulation Profile: Recent Labs  Lab 07/26/19 1105  INR 1.1    Cardiac Enzymes: Recent Labs  Lab 07/26/19 1105  CKTOTAL 256    HbA1C: No results found for: HGBA1C  CBG: No results for input(s): GLUCAP in the last 168 hours.    Critical care time:    Richardson Landry Minor ACNP Acute Care Nurse Practitioner [facility] Pulmonary/Critical Care Please consult Amion 07/27/2019, 11:19 AM   PCCM:  64 yo with seizure, was hypotensive briefly in the ED.   BP (!) 95/55   Pulse 82   Temp 98 F (36.7 C) (Oral)   Resp (!) 29   SpO2 98%   Gen: 64 yo m, resting in bed  Neuro: alert to person and place, not to time, moves all four Heart: RRR, s1 s2 Lungs: CTAB no wheeze  Abd:  soft nt nd   Labs: reviewed   A: Seizure, resolve  Acute encephalopathy, post-ictal  Hypotension, resolved   P: AEDs per neurology No longer hypotensive  I do not believe he needs ICU at this time We will discussed IMTS to pick patient back up from ED   [facility] 07/27/2019 4:28 PM

## 2019-07-27 NOTE — Progress Notes (Signed)
Neurology Progress Note   S:// Seen and examined. Became hypotensive overnight requiring pressors. Was evaluated by PCCM and taken under their service from internal medicine teaching service. Overnight EEG reviewed by Dr. Fredderick Erb review-no prominent seizures, formal read pending.   O:// Current vital signs: BP 95/64   Pulse 80   Temp 98 F (36.7 C) (Oral)   Resp 18   SpO2 95%  Vital signs in last 24 hours: Temp:  [98 F (36.7 C)] 98 F (36.7 C) (07/15 1051) Pulse Rate:  [56-92] 80 (07/16 0815) Resp:  [12-36] 18 (07/16 0815) BP: (75-120)/(45-93) 95/64 (07/16 0815) SpO2:  [91 %-100 %] 95 % (07/16 0815) General: Awake, alert, appears not well groomed and disheveled. HEENT: Normocephalic, atraumatic, poor dentition and dental hygiene CVS: Regular rhythm Respiratory: Breathing well saturating normally on room air Extremities: Warm well perfused Neurological exam Is awake, alert, oriented to himself, month and year, but not to the place.  He was surprised to find out that he is in the hospital.  He does not recollect any events from yesterday evening. His speech is not dysarthric.  He has no aphasia. He does have extremely poor attention concentration. Cranial nerves: Pupils equal round react light, extraocular movements intact, visual fields full, face looks mildly asymmetric with left nasolabial fold subtle flattening which goes away when he smiles, large acuity intact, tongue and palate are midline. Motor exam: He is moving all four extremities spontaneously and is antigravity in all fours. Sensory exam: Intact to touch all over without extinction on double simultaneous stimulation Gait testing was deferred for his safety and because he is hooked up to long-term EEG  Medications  Current Facility-Administered Medications:  .  0.9 %  sodium chloride infusion, 250 mL, Intravenous, Continuous, Merlene Laughter F, NP, Last Rate: 20 mL/hr at 07/27/19 0510, 250 mL at  07/27/19 0510 .  enoxaparin (LOVENOX) injection 40 mg, 40 mg, Subcutaneous, Q24H, Agyei, Obed K, MD .  levETIRAcetam (KEPPRA) IVPB 500 mg/100 mL premix, 500 mg, Intravenous, Q12H, Jean Rosenthal, MD, Stopped at 07/27/19 [phone] .  midodrine (PROAMATINE) tablet 10 mg, 10 mg, Oral, TID WC, Merlene Laughter F, NP, 10 mg at 07/27/19 0851 .  norepinephrine (LEVOPHED) 4mg  in 264mL premix infusion, 2-10 mcg/min, Intravenous, Titrated, Johnsie Cancel, NP, Held at 07/27/19 [phone] .  phenytoin (DILANTIN) injection 100 mg, 100 mg, Intravenous, Q8H, Amie [name], MD, 100 mg at 07/27/19 0853 .  potassium chloride 10 mEq in 100 mL IVPB, 10 mEq, Intravenous, Once, Sid Falcon, MD  Current Outpatient Medications:  .  benztropine (COGENTIN) 0.5 MG tablet, Take 1 tablet (0.5 mg total) by mouth 2 (two) times daily., Disp: 60 tablet, Rfl: 0 .  chlorhexidine (PERIDEX) 0.12 % solution, Use as directed 15 mLs in the mouth or throat 2 (two) times daily., Disp: 120 mL, Rfl: 0 .  hydrOXYzine (ATARAX/VISTARIL) 50 MG tablet, Take 1 tablet (50 mg total) by mouth 3 (three) times daily as needed for anxiety., Disp: 30 tablet, Rfl: 0 .  risperiDONE (RISPERDAL) 2 MG tablet, Take 1 tablet (2 mg total) by mouth 2 (two) times daily., Disp: 30 tablet, Rfl: 0 .  temazepam (RESTORIL) 30 MG capsule, Take 1 capsule (30 mg total) by mouth at bedtime., Disp: 30 capsule, Rfl: 0 .  traZODone (DESYREL) 100 MG tablet, Take 1 tablet (100 mg total) by mouth at bedtime., Disp: 30 tablet, Rfl: 0 Labs CBC    Component Value Date/Time   WBC 7.1 07/27/2019 0508  RBC 3.32 (L) 07/27/2019 0508   HGB 10.7 (L) 07/27/2019 0508   HCT 33.2 (L) 07/27/2019 0508   PLT 248 07/27/2019 0508   MCV 100.0 07/27/2019 0508   MCH 32.2 07/27/2019 0508   MCHC 32.2 07/27/2019 0508   RDW 14.1 07/27/2019 0508   LYMPHSABS 1.1 09/10/2018 1814   MONOABS 0.8 09/10/2018 1814   EOSABS 0.0 09/10/2018 1814   BASOSABS 0.0 09/10/2018 1814    CMP     Component Value  Date/Time   NA 139 07/27/2019 0508   K 4.6 07/27/2019 0508   CL 107 07/27/2019 0508   CO2 25 07/27/2019 0508   GLUCOSE 90 07/27/2019 0508   BUN <5 (L) 07/27/2019 0508   CREATININE 0.60 (L) 07/27/2019 0508   CALCIUM 7.9 (L) 07/27/2019 0508   PROT 4.6 (L) 07/27/2019 0508   ALBUMIN 2.6 (L) 07/27/2019 0508   AST 53 (H) 07/27/2019 0508   ALT 19 07/27/2019 0508   ALKPHOS 28 (L) 07/27/2019 0508   BILITOT 1.8 (H) 07/27/2019 0508   GFRNONAA >60 07/27/2019 0508   GFRAA >60 07/27/2019 0508   Imaging I have reviewed images in epic and the results pertinent to this consultation are: CT head unremarkable for acute process  Assessment:  64 year old with a past medical history of bipolar one disorder, homelessness, question of alcohol abuse and withdrawal, brought in with multiple seizures and had seizures in the emergency room as well with continuing altered mental status. Evaluated for ongoing seizure activity and status epilepticus.  Clinical exam initially yesterday revealed a very disoriented person with no focal sensorimotor deficits.  EEG showed multiple seizures emanating from the left hemisphere.  He was initially loaded with Keppra, then given additional Keppra load as well as benzodiazepines and fosphenytoin load. Dilantin level was therapeutic at 18. He became hypotensive requiring pressors and critical care. This morning, he is awake alert following commands and oriented to self and time. He does have poor attention concentration and is still mildly encephalopathic. I do not have a high suspicion for CNS infection He was unable to reliably tell me if he has been drinking or quit drinking recently. Overnight LTM EEG read is pending.  Impression: Status epilepticus-resolved New onset seizures-resolved Etiology of seizure-under investigation, question alcohol withdrawal  Recommendations: I would continue him on Keppra and Dilantin. Appreciate pharmacy assistance with Dilantin  management Await LTM EEG report.  If the LTM EEG overnight showed complete resolution of seizures and no concern for status, I would discontinue the LTM at that point. MRI of the brain when able to.  Even if the LTM needs to be continued, the leads that he has on his head are MRI compatible and he should be able to get the MRI with the leads on. We will try to get the MRI done, if it has to be done with the leads on, during the daytime so that the technologist start available in case any questions arise from MRI or there is a need to reposition the leads. Do not see a need for spinal tap. Management of other medical comorbidities per primary team as you are.  Appreciate PCCM consultation and taking over care of this patient.  I will continue to follow with you. -- Amie [name], MD [facility] Pager: [phone] If 7pm to 7am, please call on call as listed on AMION.  CRITICAL CARE ATTESTATION Performed by: Amie [name], MD Total critical care time: 32 minutes Critical care time was exclusive of separately billable procedures and treating  other patients and/or supervising APPs/Residents/Students Critical care was necessary to treat or prevent imminent or life-threatening deterioration due to status epilepticus which has resolved, new onset seizures. This patient is critically ill and at significant risk for neurological worsening and/or death and care requires constant monitoring. Critical care was time spent personally by me on the following activities: development of treatment plan with patient and/or surrogate as well as nursing, discussions with consultants, evaluation of patient's response to treatment, examination of patient, obtaining history from patient or surrogate, ordering and performing treatments and interventions, ordering and review of laboratory studies, ordering and review of radiographic studies, pulse oximetry, re-evaluation of patient's condition, participation in  multidisciplinary rounds and medical decision making of high complexity in the care of this patient.

## 2019-07-27 NOTE — Progress Notes (Signed)
LTM maint complete - no skin breakdown under: F7 Fp1  Fp2

## 2019-07-27 NOTE — Progress Notes (Signed)
vLTM EEG complete. No skin breakdown 

## 2019-07-27 NOTE — Progress Notes (Signed)
Night team progress note: Patient still has low blood pressure after IV fluid. (Had brief improvement of BP to 100s and then became hypotensive again. BP has been around 70s-90s with MAP 60-80. His mental status appears to be mildly improved comparing to arrival. He is lethargic but arousable and now he can give brief answer to some of our questions.  Antiepileptic medications could cause hypotension. However, those were given few hours ago. At the same time, no other underlying etiology for his hypotension. No evidence of sepsis, cardiogenic shock or ongoing hypovolemia. Per chart review, patient has Hx of suicidal ideation. He declines suicidal effort before admission. Although less likely but medications intoxication that could have cause seizure and hypotension can be in differential. I expect other finding in EKG, etc.  We talked to PCCM, in case patient remains hypotensive despite IV fluid. Recommended to continue IVF, hold off of levophed and they will evaluate patient. Appreciate further recommendations after evaluation.   We also talked to Dr. Cheral Marker, neurology, and mentions that we can consider switching IV Dilantin to PO through NG tube (with the same dose of 100 mg TID) for less effect on BP. How ever, medication induced hypotension expected to be transient and if lasts, should look for other etiology. He agrees with discussing case with PCCM.

## 2019-07-27 NOTE — ED Notes (Signed)
NP at bedside.

## 2019-07-27 NOTE — ED Notes (Signed)
Contacted lab regarding kepra. Stated they would send now.

## 2019-07-27 NOTE — Procedures (Addendum)
Patient Name: [name]  MRN: [id number]  Epilepsy Attending: Lora Havens  Referring Physician/Provider: Dr Adaline Sill Duration: 07/26/2019 1638 to 07/27/2019 1053  Patient history: 64 year old male brought to the hospital by EMS secondary to visualize 2 seizures. While in the emergency room patient had another seizure which was described as tonic-clonic. EEG to evaluate for seizure  Level of alertness:  awake, asleep  AEDs during EEG study: LEV, PHT  Technical aspects: This EEG study was done with scalp electrodes positioned according to the 10-20 International system of electrode placement. Electrical activity was acquired at a sampling rate of 500Hz  and reviewed with a high frequency filter of 70Hz  and a low frequency filter of 1Hz . EEG data were recorded continuously and digitally stored.   Description: No clear posterior dominant rhythm was seen. Sleep was characterized by sleep spindles (12 to 14 Hz), maximal frontocentral region. EEG showed low amplitude 15-18Hz  generalized beta activity as well as continuous 3 to 6 Hz theta-delta slowing in left hemisphere, maximal left frontotemporal region.  Hyperventilation and photic stimulation were not performed.     ABNORMALITY -Continuous slow, left hemisphere, maximal left frontotemporal region  IMPRESSION: This study is suggestive of cortical dysfunction in left hemisphere, maximal left frontotemporal region which could be secondary to underlying structural abnormality, post-ictal state. No seizures and epileptiform discharges were seen during this study.  Andrew Chaney Barbra Sarks

## 2019-07-27 NOTE — ED Notes (Signed)
MD Braswell notified about systolic B/P in 01'I. Per MD Braswell will wait for further orders after consult with critical care.

## 2019-07-27 NOTE — ED Notes (Signed)
MD Braswell notified about B/P 88/59

## 2019-07-27 NOTE — ED Notes (Signed)
Patient transported to MRI 

## 2019-07-27 NOTE — ED Notes (Signed)
Pt tolerated lying flat for linen change, followed directions during this activity though still A&Ox1 only. MRI called to reattempt scan now that pt is more able to follow commands and lie still.

## 2019-07-27 NOTE — Progress Notes (Signed)
Phenytoin in goal range.   EEG reviewed. Rhythmic muscle artifact seen at times, without underlying electrographic seizures. Diffuse slowing noted, more prominently earlier in the record.   Electronically signed: Dr. Kerney Elbe

## 2019-07-27 NOTE — ED Notes (Signed)
MD Braswell notified of B/P 87/64 after 1 L of fluid

## 2019-07-27 NOTE — Progress Notes (Signed)
Informed of patient stable to transfer out of ICU level care. IMTS to take over 7/17 at [place].

## 2019-07-27 NOTE — Progress Notes (Signed)
Andrew Chaney [id number] Admission Data: 07/27/2019 11:07 PM Attending Provider: Margaretha Seeds, MD  [id number], No Pcp Per Consults/ Treatment Team:   Andrew Chaney is a 64 y.o. male patient admitted from ED awake, alert  & orientated  X 2,  Full Code, VSS - Blood pressure 122/75, pulse 67, temperature 98.6 F (37 C), temperature source Oral, resp. rate 15, height 6' (1.829 m), weight 77.8 kg, SpO2 100 %., Room Air, no c/o shortness of breath, no c/o chest pain, no distress noted. Wall Monitor Tele # [phone] placed and pt is currently running: NSR   IV site AQL:[id number] dry and intact.  Allergies:  No Known Allergies   Past Medical History:  Diagnosis Date  . Bipolar 1 disorder ([place])   . 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 Renal disorder     History:  Unable to obtain from patient. Tobacco/alcohol: current for both  Pt orientation to unit, room and routine. Information packet given to patient/family and safety video watched.  Admission INP armband ID verified with patient/family, and in place. Seizure precaution applied - Side rails up and padded, low bed, floor mats, wall suction. Fall risk assessment complete with Patient verbalizing understanding of risks associated with falls. Pt verbalizes an understanding of how to use the call bell and to call for help before getting out of bed.  Skin, clean-dry- intact without evidence of bruising, or skin tears. No evidence of skin break down noted on exam.  Will cont to monitor and assist as needed.  Eliezer Champagne, RN 07/27/2019 11:07 PM

## 2019-07-27 NOTE — Consult Note (Signed)
NAME:  Andrew Chaney, MRN:  [id number], DOB:  Aug 17, 1955, LOS: 1 ADMISSION DATE:  07/26/2019, CONSULTATION DATE: 07/27/2019 REFERRING MD: Dr. Oralia Manis, CHIEF COMPLAINT: Hypertension  Brief History   64 year old male presented with status epilepticus, treated with multiple antibiotics in ED with resolution of seizures however later developed persistent hypotension for which PCCM was consulted  History of present illness   Andrew Chaney is a 64 year old male with a past medical history significant for bipolar 1 disease, germ cell seroma, homelessness, and medical renal disease with witnessed seizure activity at homeless shelter where he resides.  Per chart EMS witnessed 2 seizures on arrival. Patient received Ativan by EMS and transported to ED.   Vital signs on arrival relatively unremarkable.  Lab work significant for hypokalemia with potassium of 2.8 and slightly elevated alkaline phosphatase. Urine drug screen positive for benzodiazepines and THC on admission.  It appears on assessment per neurology patient was seen in status epilepticus for which he received fosphenytoin, Dilantin, Keppra, and as needed pushes of Ativan with resolution of seizure seen.  Patient is currently on long-term EEG with MRI brain pending.  Patient initially admitted per internal medicine teaching service.  While in the emergency department patient has intermittently been seen with hypotension for which she received 3 L of IV fluid.  Internal medicine teaching service reached out to PCCM for further management of hypotension and possible need of pressors  Past Medical History  Renal disease Germ cell seroma 2011 Bipolar Homelessness EtOH abuse  Significant Hospital Events   Admitted 7/16 and status epilepticus  Consults:  Neurology  Procedures:  Long-term EEG 7/16  Significant Diagnostic Tests:  Cervical spine/head CT 7/16 > Low-density medially within the anterior left frontal lobe concerning for acute  infarct.  Degenerative disc and facet disease throughout the cervical spine. No acute bony abnormality.  Chest x-ray 7/15 > 1. No active cardiopulmonary disease. 2. Incidental note of a 6.7 cm area of chondroid matrix within the proximal left humeral metaphysis suggesting a chondroid lesion such as enchondroma. If there is pain referable to this location, initial evaluation with dedicated radiographs of the left shoulder could be performed.   Micro Data:  Covid 7/15 > negative  Antimicrobials:     Interim history/subjective:  Lying in bed in no acute distress  Objective   Blood pressure 108/72, pulse 83, temperature 98 F (36.7 C), temperature source Oral, resp. rate (!) 22, SpO2 99 %.        Intake/Output Summary (Last 24 hours) at 07/27/2019 0401 Last data filed at 07/27/2019 0219 Gross per 24 hour  Intake 3805.99 ml  Output --  Net 3805.99 ml   There were no vitals filed for this visit.  Examination: General: Chronic ill-appearing middle-aged gentleman lying on ED stretcher in no acute distress HEENT: [place]/AT, MM pink/moist, PERRL sclera nonicteric  Neuro: Alert and oriented x3, slightly drowsy, nonfocal CV: s1s2 regular rate and rhythm, no murmur, rubs, or gallops,  PULM: Clear to auscultation bilaterally no increased work of breathing GI: soft, bowel sounds active in all 4 quadrants, non-tender, non-distended Extremities: warm/dry, non pitting lower extremity edema  Skin: no rashes or lesions  Resolved Hospital Problem list     Assessment & Plan:  Hypotension -Etiology unknown, no clear evidence of infection or sepsis.  Did receive multiple antiepileptics but this was earlier in the evening. P: Obtain TSH, random cortisol, and repeat be met Add midodrine Hold on any further fluids Peripheral Levophed for MAP greater than 65 Obtain  echocardiogram Admit to ICU for close monitoring  Status epilepticus -Patient seen with seizure activity at homeless  shelter and on arrival to ED.  Patient does report chronic alcohol abuse but denies any withdrawal seizures in the past. P: Primary management per neurology AEDs per neurology Seizure precautions Continue LTM MRI pending Frequent neurochecks   Best practice:  Diet: N.p.o. Pain/Anxiety/Delirium protocol (if indicated): As needed VAP protocol (if indicated): Not applicable DVT prophylaxis: Subcu heparin GI prophylaxis: PPI Glucose control: Monitor Mobility: Bedrest Code Status: Full code Family Communication: Per primary Disposition: ICU  Labs   CBC: Recent Labs  Lab 07/26/19 1105  WBC 5.0  HGB 12.2*  HCT 37.4*  MCV 98.9  PLT 706    Basic Metabolic Panel: Recent Labs  Lab 07/26/19 1105  NA 140  K 2.8*  CL 106  CO2 25  GLUCOSE 115*  BUN 7*  CREATININE 0.74  CALCIUM 7.8*   GFR: CrCl cannot be calculated (Unknown ideal weight.). Recent Labs  Lab 07/26/19 1105  WBC 5.0  LATICACIDVEN 1.8    Liver Function Tests: Recent Labs  Lab 07/26/19 1105  AST 35  ALT 25  ALKPHOS 30*  BILITOT 1.0  PROT 5.1*  ALBUMIN 2.9*   No results for input(s): LIPASE, AMYLASE in the last 168 hours. No results for input(s): AMMONIA in the last 168 hours.  ABG No results found for: PHART, PCO2ART, PO2ART, HCO3, TCO2, ACIDBASEDEF, O2SAT   Coagulation Profile: Recent Labs  Lab 07/26/19 1105  INR 1.1    Cardiac Enzymes: Recent Labs  Lab 07/26/19 1105  CKTOTAL 256    HbA1C: No results found for: HGBA1C  CBG: No results for input(s): GLUCAP in the last 168 hours.  Review of Systems:   Unable to assess given lethargy   Past Medical History  He,  has a past medical history of Bipolar 1 disorder ([place]), Cancer ([place]) (2011), and Renal disorder.   Surgical History   History reviewed. No pertinent surgical history.   Social History   reports that he has been smoking cigarettes. He has never used smokeless tobacco. He reports current alcohol use. He reports  current drug use. Drug: Marijuana.   Family History   His family history is not on file.   Allergies No Known Allergies   Home Medications  Prior to Admission medications   Medication Sig Start Date End Date Taking? Authorizing Provider  benztropine (COGENTIN) 0.5 MG tablet Take 1 tablet (0.5 mg total) by mouth 2 (two) times daily. 04/13/18   Mordecai Maes, NP  chlorhexidine (PERIDEX) 0.12 % solution Use as directed 15 mLs in the mouth or throat 2 (two) times daily. 10/23/18   Tacy Learn, PA-C  hydrOXYzine (ATARAX/VISTARIL) 50 MG tablet Take 1 tablet (50 mg total) by mouth 3 (three) times daily as needed for anxiety. 04/13/18   Mordecai Maes, NP  risperiDONE (RISPERDAL) 2 MG tablet Take 1 tablet (2 mg total) by mouth 2 (two) times daily. 09/05/18   Rankin, Shuvon B, NP  temazepam (RESTORIL) 30 MG capsule Take 1 capsule (30 mg total) by mouth at bedtime. 04/13/18   Mordecai Maes, NP  traZODone (DESYREL) 100 MG tablet Take 1 tablet (100 mg total) by mouth at bedtime. 09/05/18   Rankin, Delphia Grates B, NP     Critical care time:    Performed by: Johnsie Cancel  Total critical care time: 40 minutes  Critical care time was exclusive of separately billable procedures and treating other patients.  Critical care  was necessary to treat or prevent imminent or life-threatening deterioration.  Critical care was time spent personally by me on the following activities: development of treatment plan with patient and/or surrogate as well as nursing, discussions with consultants, evaluation of patient's response to treatment, examination of patient, obtaining history from patient or surrogate, ordering and performing treatments and interventions, ordering and review of laboratory studies, ordering and review of radiographic studies, pulse oximetry and re-evaluation of patient's condition.  Johnsie Cancel, NP-[facility] Contact / Pager information can be found on Amion    07/27/2019, 4:25 AM  '

## 2019-07-27 NOTE — ED Notes (Signed)
Pt found tugging at EEG wires. RN and ED secretary attempted to contact EEG tech. No answer, message left.

## 2019-07-27 NOTE — ED Notes (Signed)
Per MD [place], Neuro and CC will consult with pt. PT will continue receiving fluid bolus for the time being.

## 2019-07-28 ENCOUNTER — Inpatient Hospital Stay (HOSPITAL_COMMUNITY): Payer: Self-pay

## 2019-07-28 LAB — BASIC METABOLIC PANEL
Anion gap: 13 (ref 5–15)
BUN: <5 mg/dL — ABNORMAL LOW (ref 8–23)
CO2: 22 mmol/L (ref 22–32)
Calcium: 8.6 mg/dL — ABNORMAL LOW (ref 8.9–10.3)
Chloride: 102 mmol/L (ref 98–111)
Creatinine, Ser: 0.76 mg/dL (ref 0.61–1.24)
GFR calc Af Amer: >60 mL/min (ref >60)
GFR calc non Af Amer: >60 mL/min (ref >60)
Glucose, Bld: 83 mg/dL (ref 70–99)
Potassium: 3.2 mmol/L — ABNORMAL LOW (ref 3.5–5.1)
Sodium: 137 mmol/L (ref 135–145)

## 2019-07-28 LAB — FOLATE: Folate: 13.2 ng/mL (ref >5.9)

## 2019-07-28 LAB — URINE CULTURE: Culture: NO GROWTH

## 2019-07-28 LAB — MAGNESIUM: Magnesium: 1.3 mg/dL — ABNORMAL LOW (ref 1.7–2.4)

## 2019-07-28 LAB — VITAMIN B12: Vitamin B-12: 308 pg/mL (ref 180–914)

## 2019-07-28 LAB — PHOSPHORUS: Phosphorus: 2 mg/dL — ABNORMAL LOW (ref 2.5–4.6)

## 2019-07-28 MED ORDER — THIAMINE HCL 100 MG/ML IJ SOLN: 100.0000 mg | Freq: Every day | INTRAMUSCULAR | Status: DC

## 2019-07-28 MED ORDER — K PHOS MONO-SOD PHOS DI & MONO 155-852-130 MG PO TABS
500.0000 mg | ORAL_TABLET | Freq: Three times a day (TID) | ORAL | Status: AC
  Administered 2019-07-28 – 2019-07-29 (×2): 500 mg via ORAL
  Filled 2019-07-28 (×3): qty 2

## 2019-07-28 MED ORDER — LORAZEPAM 1 MG PO TABS
1.0000 mg | ORAL_TABLET | ORAL | Status: AC | PRN
  Administered 2019-07-31: 1 mg via ORAL
  Filled 2019-07-28: qty 1

## 2019-07-28 MED ORDER — K PHOS MONO-SOD PHOS DI & MONO 155-852-130 MG PO TABS: 1000.0000 mg | ORAL_TABLET | Freq: Three times a day (TID) | ORAL | Status: DC

## 2019-07-28 MED ORDER — POTASSIUM PHOSPHATES 15 MMOLE/5ML IV SOLN
30.0000 mmol | Freq: Once | INTRAVENOUS | Status: AC
  Administered 2019-07-28: 30 mmol via INTRAVENOUS
  Filled 2019-07-28: qty 10

## 2019-07-28 MED ORDER — GADOBUTROL 1 MMOL/ML IV SOLN
7.0000 mL | Freq: Once | INTRAVENOUS | Status: AC | PRN
  Administered 2019-07-28: 7 mL via INTRAVENOUS

## 2019-07-28 MED ORDER — LORAZEPAM 2 MG/ML IJ SOLN
0.5000 mg | INTRAMUSCULAR | Status: DC | PRN
  Administered 2019-07-28: 1 mg via INTRAVENOUS
  Administered 2019-07-28: 0.5 mg via INTRAVENOUS
  Filled 2019-07-28 (×3): qty 1

## 2019-07-28 MED ORDER — THIAMINE HCL 100 MG PO TABS: 100.0000 mg | ORAL_TABLET | Freq: Every day | ORAL | Status: DC

## 2019-07-28 MED ORDER — THIAMINE HCL 100 MG/ML IJ SOLN
500.0000 mg | Freq: Three times a day (TID) | INTRAVENOUS | Status: AC
  Administered 2019-07-28 – 2019-07-31 (×9): 500 mg via INTRAVENOUS
  Filled 2019-07-28 (×9): qty 5

## 2019-07-28 MED ORDER — LORAZEPAM 2 MG/ML IJ SOLN
1.0000 mg | INTRAMUSCULAR | Status: AC | PRN
  Administered 2019-07-29 (×2): 2 mg via INTRAVENOUS
  Filled 2019-07-28 (×4): qty 1

## 2019-07-28 MED ORDER — MAGNESIUM SULFATE 4 GM/100ML IV SOLN
4.0000 g | Freq: Once | INTRAVENOUS | Status: AC
  Administered 2019-07-28: 4 g via INTRAVENOUS
  Filled 2019-07-28: qty 100

## 2019-07-28 NOTE — Progress Notes (Addendum)
Neurology Progress Note   S:// Patient seen and examined this morning. He was transferred to the floor.  Was awake and alert, but reportedly confused all night. MRI was done yesterday after LTM discontinuation Complains of feeling thirsty.  Failed bedside swallow screen.  Formal swallow evaluation pending.  O:// Current vital signs: BP 111/77 (BP Location: Right Arm)   Pulse 95   Temp 98.4 F (36.9 C) (Axillary)   Resp 15   Ht 6' (1.829 m) Comment: per [facility] 09/2018  Wt 77.8 kg   SpO2 99%   BMI 23.26 kg/m  Vital signs in last 24 hours: Temp:  [98.4 F (36.9 C)-98.6 F (37 C)] 98.4 F (36.9 C) (07/17 0438) Pulse Rate:  [58-95] 95 (07/17 0438) Resp:  [12-29] 15 (07/17 0438) BP: (92-136)/(53-91) 111/77 (07/17 0438) SpO2:  [97 %-100 %] 99 % (07/17 0438) Weight:  [77.8 kg] 77.8 kg (07/16 2020) Neurological exam Awake, alert, oriented to self. Is not oriented to the place or time He is having difficulty with word finding. He is able to name some simple objects but having a fluent conversation is difficult He seems to get frustrated by not being able to complete his sentences. Also appears to have somewhat of a flat affect Poor attention concentration Speech is mildly dysarthric Cranial nerves: Pupils equal round reactive light, extra movements intact, visual fields full, face appears symmetric with the caveat that he has poor oral hygiene and dentition, auditory acuity intact, tongue and palate are midline.  This seems to be mild continuous involuntary thrusting of his tongue-I cannot say that that is because of his poor dentition or otherwise. Motor exam: He is full strength in both upper extremities.Does not keep any of the lower extremities up against gravity but has at least antigravity strength. Sensory exam intact to light touch Coordination difficult to assess   Medications  Current Facility-Administered Medications:  .  0.9 %  sodium chloride infusion, 250 mL,  Intravenous, Continuous, Merlene Laughter F, NP, Last Rate: 10 mL/hr at 07/28/19 0659, Rate Change at 07/28/19 0659 .  enoxaparin (LOVENOX) injection 40 mg, 40 mg, Subcutaneous, Q24H, Agyei, Obed K, MD .  levETIRAcetam (KEPPRA) IVPB 500 mg/100 mL premix, 500 mg, Intravenous, Q12H, Agyei, Obed K, MD, Last Rate: 400 mL/hr at 07/28/19 0659, Rate Change at 07/28/19 0659 .  midodrine (PROAMATINE) tablet 10 mg, 10 mg, Oral, TID WC, Merlene Laughter F, NP, 10 mg at 07/28/19 0827 .  phenytoin (DILANTIN) injection 100 mg, 100 mg, Intravenous, Q8H, Amie [name], MD, 100 mg at 07/28/19 0828 .  potassium chloride 10 mEq in 100 mL IVPB, 10 mEq, Intravenous, Once, Sid Falcon, MD Labs CBC    Component Value Date/Time   WBC 7.1 07/27/2019 0508   RBC 3.32 (L) 07/27/2019 0508   HGB 10.7 (L) 07/27/2019 0508   HCT 33.2 (L) 07/27/2019 0508   PLT 248 07/27/2019 0508   MCV 100.0 07/27/2019 0508   MCH 32.2 07/27/2019 0508   MCHC 32.2 07/27/2019 0508   RDW 14.1 07/27/2019 0508   LYMPHSABS 1.1 09/10/2018 1814   MONOABS 0.8 09/10/2018 1814   EOSABS 0.0 09/10/2018 1814   BASOSABS 0.0 09/10/2018 1814    CMP     Component Value Date/Time   NA 139 07/27/2019 0508   K 4.6 07/27/2019 0508   CL 107 07/27/2019 0508   CO2 25 07/27/2019 0508   GLUCOSE 90 07/27/2019 0508   BUN <5 (L) 07/27/2019 0508   CREATININE 0.60 (L) 07/27/2019 6606  CALCIUM 7.9 (L) 07/27/2019 0508   PROT 4.6 (L) 07/27/2019 0508   ALBUMIN 2.6 (L) 07/27/2019 0508   AST 53 (H) 07/27/2019 0508   ALT 19 07/27/2019 0508   ALKPHOS 28 (L) 07/27/2019 0508   BILITOT 1.8 (H) 07/27/2019 0508   GFRNONAA >60 07/27/2019 0508   GFRAA >60 07/27/2019 9450   Urine toxicology screen positive for THC and benzos-unclear if sample obtained after giving him benzos for his seizure control but that is very likely  LTM EEG with no seizures Continuous slow, left hemispheric maximal left frontotemporal region.  Imaging I have reviewed images in epic and the  results pertinent to this consultation are: CT head with no acute changes MRI brain was motion degraded due to his inability to tolerate-shows cortical and subcortical signal abnormality in the medial left frontal lobe-status epilepticus sequela versus subacute infarct versus neoplasm and other considerations could include cerebritis.  Assessment: 64 year old man, with past medical history of bipolar disorder, homelessness, question of alcohol abuse and withdrawal, brought in after multiple seizures and had seizures in the emergency room with ongoing mental status decline, treated with Keppra and EEG obtained showing left frontotemporal intermittent seizures-additional antiepileptic Dilantin loaded along with multiple doses of benzodiazepines. Required pressor support overnight and a critical care consultation-the night of arrival due to hypotension, which is resolved-likely medication induced from Dilantin. His brain MRI shows cortical and subcortical diffusion abnormality And T2 hyperintensity in the left frontal cortical and subcortical areas concerning for subacute stroke versus sequela of status epilepticus.  Other etiologies considered are neoplasm as well as infectious process. He remains confused and has mild degree of aphasia as well along with what looks like abulia commonly seen with frontal lobe involvement. My impression, due to clinical seizures, is frontal lobe seizures which can present with variety of mental status changes and bizarre psychiatric looking manifestations. That brain MRI abnormality does need to be followed up with a postcontrast image.  Impression: -Left hemispheric seizures and status epilepticus-status epilepticus resolved.  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  Recommendations: -Continue Keppra and  Dilantin -Appreciate pharmacy assistance with Dilantin dosing and monitoring. -Maintain seizure precautions -CIWA protocol -MRI brain with contrast to rule out infectious or inflammatory process -Low suspicion for infectious process in the brain-will defer any decision on spinal tap after contrast-enhanced brain MRI is performed and reported.  We will continue to follow.  I attempted to call the brothers number listed on the chart-unable to reach them.  No voicemail set up.  Please try to get as much information from them as possible regarding the patient's baseline and social history.  -- Amie [name], MD [facility] Pager: [phone] If 7pm to 7am, please call on call as listed on AMION.

## 2019-07-28 NOTE — Evaluation (Signed)
Clinical/Bedside Swallow Evaluation Patient Details  Name: Andrew Chaney MRN: [id number] Date of Birth: 03-27-1955  Today's Date: 07/28/2019 Time: SLP Start Time (ACUTE ONLY): 1025 SLP Stop Time (ACUTE ONLY): 1052 SLP Time Calculation (min) (ACUTE ONLY): 27 min  Past Medical History:  Past Medical History:  Diagnosis Date  . Bipolar 1 disorder ([place])   . 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 Renal disorder    Past Surgical History: History reviewed. No pertinent surgical history. HPI:  Andrew Chaney is a 64 year old male with a past medical history significant for bipolar 1 disease, germ cell seroma, homelessness, and medical renal disease with witnessed seizure activity at homeless shelter where he resides per MD notes.  Per chart EMS witnessed 2 seizures on arrival. Patient received Ativan by EMS.  Urine drug screen positive for benzodiazepines and THC on admission per notes.  Per neurology note, patient was seen in status epilepticus for which he received fosphenytoin, Dilantin, Keppra, and as needed pushes of Ativan. Swallow eval was ordered as pt failed [facility] screen.  .   Assessment / Plan / Recommendation Clinical Impression  Pt presents with clinical indication of cognitve based dysphagia c/b decreased sustained attention and perseveraton on "doing the wrong thing".   His responses are very delayed and he is demonstrating expreessive language deficits which appears frustrting to him.  Pt without severe dysarthria but expressive language deficits.  He did not demonstrate lingual thrusting. Pt with slow mastication of solids but adequate clearance.  Did not test 3 ounce [facility] due to pt's altered mentation heightening aspiration risk.     Recommend when deem appropriate to advance, dys3/thin advised due to poor dentition and decreased sustained attention.  SLP notified teaching service and clears desired per their response.    Will follow up x1 to assure tolerance = if pt's  dysphasia does not resolve, he would benefit from SLP speech evaluation to help faciliate communication.  Pt informed of recommendations. SLP Visit Diagnosis: Dysphagia, oral phase (R13.11)    Aspiration Risk  Mild aspiration risk    Diet Recommendation Thin liquid   Liquid Administration via: Cup;Straw Medication Administration:  (take with puree if problematic) Supervision: Full supervision/cueing for compensatory strategies;Patient able to self feed Compensations: Slow rate;Small sips/bites Postural Changes: Seated upright at 90 degrees;Remain upright for at least 30 minutes after po intake    Other  Recommendations Oral Care Recommendations: Oral care BID   Follow up Recommendations   tbd     Frequency and Duration min 1 x/week  1 week       Prognosis Prognosis for Safe Diet Advancement: Good Barriers to Reach Goals: Cognitive deficits      Swallow Study   General Date of Onset: 07/28/19 HPI: Andrew Chaney is a 64 year old male with a past medical history significant for bipolar 1 disease, germ cell seroma, homelessness, and medical renal disease with witnessed seizure activity at homeless shelter where he resides per MD notes.  Per chart EMS witnessed 2 seizures on arrival. Patient received Ativan by EMS.  Urine drug screen positive for benzodiazepines and THC on admission per notes.  Per neurology note, patient was seen in status epilepticus for which he received fosphenytoin, Dilantin, Keppra, and as needed pushes of Ativan. Swallow eval was ordered as pt failed [facility] screen.  . Type of Study: Bedside Swallow Evaluation Diet Prior to this Study: NPO Temperature Spikes Noted: No Respiratory Status: Room air History of Recent Intubation: No Behavior/Cognition: Alert;Cooperative;Other (Comment);Confused;Distractible;Requires cueing  Oral Cavity Assessment: Within Functional Limits Oral Care Completed by SLP: No Oral Cavity - Dentition: Adequate natural  dentition Self-Feeding Abilities: Needs assist Patient Positioning: Upright in bed Baseline Vocal Quality: Normal Volitional Cough: Cognitively unable to elicit Volitional Swallow: Unable to elicit    Oral/Motor/Sensory Function Overall Oral Motor/Sensory Function: Generalized oral weakness (no lingual thrusting)   Ice Chips Ice chips: Not tested   Thin Liquid Thin Liquid: Within functional limits Presentation: Straw;Spoon    Nectar Thick Nectar Thick Liquid: Not tested   Honey Thick Honey Thick Liquid: Not tested   Puree Puree: Within functional limits Presentation: Spoon   Solid     Solid: Impaired Oral Phase Impairments: Reduced lingual movement/coordination;Impaired mastication Oral Phase Functional Implications: Impaired mastication;Prolonged oral transit Other Comments: peach      Macario Golds 07/28/2019,10:53 AM   Kathleen Lime, MS [place] [facility] [phone]

## 2019-07-28 NOTE — Progress Notes (Addendum)
Attempted to administer PO Potassium Phos tablets. Pt would not take them from me nor would he take them by himself. He did not want to touch anything and asking me if my hands were clean. Then proceeded to worry about hjs hands being clean. Even after he washed his hands and I rewashed mine he still would not take PO meds. Pt asking me to help him with taking his IV out. I have wrapped IV with gauze. Will continue to monitor.

## 2019-07-28 NOTE — Progress Notes (Signed)
Made treatment team aware of increasing CIWA. Referred to the CIWA order set for guidelines if needed for treatment team. Dr. Eileen Stanford placed a modified CIWA medication intervention scale. Will continue to monitor and advise treatment team.

## 2019-07-28 NOTE — Progress Notes (Addendum)
° °  Subjective: HD#2   Overnight: No acute events reported   Today, Andrew Chaney appears to be alert. He is oriented to person, and situation. Not oriented to place. He states that he does not remember how he got to the hospital.  Patient states that he had history in the past but does not remember the detail. He asked for water and I made him aware that we are awaiting SLP evaluation   Objective:  Vital signs in last 24 hours: Vitals:   07/27/19 1936 07/27/19 2020 07/27/19 2100 07/28/19 0438  BP:  122/75  111/77  Pulse: 71 67  95  Resp: 13 15  15   Temp:  98.6 F (37 C)  98.4 F (36.9 C)  TempSrc:  Oral  Axillary  SpO2: 100% 100%  99%  Weight:  77.8 kg    Height:   6' (1.829 m)    Const: In no apparent distress, lying comfortably in bed, conversational HEENT: Poor dentition Resp: CTA BL, no wheezes, crackles, rhonchi CV: RRR, no murmurs, gallop, rub Ext: No lower extremity edema, skin is warm to touch   Assessment/Plan:  Active Problems:   Acute encephalopathy   Seizure (HCC)   Status epilepticus [facility])  Andrew Chaney is a 64 year old gentleman unhoused with medical history significant for bipolar 1 disorder, germ cell seroma, medical renal disease who presented with status epilepticus.  In the ED, he received fosphenytoin, Dilantin, Keppra and Ativan.  He was admitted to the ICU for closer observation as well as hypotension from July 16 to July 17 for which he was started on vasopressor.   #Acute encephalopathy-resolved #Status epilepticus-resolved:  His encephalopathy has resolved and much more alert this morning. He does not recall the events the transpired prior to his admission to the hospital. MRI brain that was done yesterday showed motion degraded examination, Cortical and subcortical signal abnormality in the medial left frontal lobe. Considerations include subacute infarct, neoplasm such as glioma, status epilepticus, and cerebritis.  Radiology recommended repeat MRI  brain.  Long-term EEG showed no seizure or epileptiform discharges. -Continue Keppra, Dilantin -Appreciate neurology recommendations -Follow up MRI brain with contrast    #Hypotension: The etiology of his hypotension is unclear however given that he was placed on several antiepileptics as well as receiving several doses of Ativan, this could potentially be the explanation.  TSH is unremarkable, cortisol unremarkable.  He is now on midodrine.  There is no obvious signs of sepsis. UCx NGTD.  BP this morning is 111/77 -Continue midodrine -Follow-up urine culture, blood culture   #Prior Hx of thiamine deficiency: Thiamine level in 2014 was 7 -Start high dose thiamine 500 mg TID x 3 days -Follow up thiamine level   #Hypophosphatemia: IV and oral repletion #Hypomagnesemia: IV repletion   #Bipolar 1 disorder: Home medications includes benztropine, Risperdal, trazodone and temazepam -Hold medications for now    FEN: Clear liquid diet VTE ppx:SQ lovenox CODE STATUS:FULL  Prior to Admission Living Arrangement: Homeless shelter  Anticipated Discharge Location: Unknown  Barriers to Discharge: Medical Treatment  Dispo: Anticipated discharge in approximately 2-3 day(s).    Jean Rosenthal, MD 07/28/2019, 6:55 AM Pager: [phone] Internal Medicine Teaching Service After 5pm on weekdays and 1pm on weekends: On Call pager: [phone]

## 2019-07-28 NOTE — Plan of Care (Signed)

## 2019-07-28 NOTE — Plan of Care (Signed)
General plan will improve

## 2019-07-29 ENCOUNTER — Inpatient Hospital Stay (HOSPITAL_COMMUNITY): Payer: Self-pay

## 2019-07-29 DIAGNOSIS — E876 Hypokalemia: Secondary | ICD-10-CM

## 2019-07-29 DIAGNOSIS — M7989 Other specified soft tissue disorders: Secondary | ICD-10-CM

## 2019-07-29 DIAGNOSIS — M79609 Pain in unspecified limb: Secondary | ICD-10-CM

## 2019-07-29 DIAGNOSIS — L538 Other specified erythematous conditions: Secondary | ICD-10-CM

## 2019-07-29 DIAGNOSIS — F1215 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: Secondary | ICD-10-CM | POA: Diagnosis present

## 2019-07-29 DIAGNOSIS — M799 Soft tissue disorder, unspecified: Secondary | ICD-10-CM

## 2019-07-29 LAB — BASIC METABOLIC PANEL
Anion gap: 13 (ref 5–15)
BUN: <5 mg/dL — ABNORMAL LOW (ref 8–23)
CO2: 23 mmol/L (ref 22–32)
Calcium: 8.6 mg/dL — ABNORMAL LOW (ref 8.9–10.3)
Chloride: 101 mmol/L (ref 98–111)
Creatinine, Ser: 0.79 mg/dL (ref 0.61–1.24)
GFR calc Af Amer: >60 mL/min (ref >60)
GFR calc non Af Amer: >60 mL/min (ref >60)
Glucose, Bld: 81 mg/dL (ref 70–99)
Potassium: 3.4 mmol/L — ABNORMAL LOW (ref 3.5–5.1)
Sodium: 137 mmol/L (ref 135–145)

## 2019-07-29 LAB — PHOSPHORUS: Phosphorus: 2.5 mg/dL (ref 2.5–4.6)

## 2019-07-29 LAB — MAGNESIUM: Magnesium: 1.9 mg/dL (ref 1.7–2.4)

## 2019-07-29 MED ORDER — POTASSIUM CHLORIDE 20 MEQ PO PACK: 40.0000 meq | PACK | Freq: Once | ORAL | Status: DC

## 2019-07-29 MED ORDER — POTASSIUM CHLORIDE 10 MEQ/100ML IV SOLN
10.0000 meq | INTRAVENOUS | Status: AC
  Administered 2019-07-29 (×3): 10 meq via INTRAVENOUS
  Filled 2019-07-29 (×3): qty 100

## 2019-07-29 MED ORDER — PALIPERIDONE ER 6 MG PO TB24
6.0000 mg | ORAL_TABLET | Freq: Every day | ORAL | Status: DC
  Administered 2019-07-30 – 2019-08-07 (×9): 6 mg via ORAL
  Filled 2019-07-29 (×9): qty 1

## 2019-07-29 MED ORDER — HYDROXYZINE HCL 10 MG/5ML PO SYRP
25.0000 mg | ORAL_SOLUTION | Freq: Every day | ORAL | Status: DC
  Administered 2019-07-29 – 2019-08-01 (×3): 25 mg via ORAL
  Filled 2019-07-29 (×6): qty 12.5

## 2019-07-29 MED ORDER — PALIPERIDONE ER 6 MG PO TB24: 6.0000 mg | ORAL_TABLET | Freq: Every day | ORAL | Status: DC

## 2019-07-29 MED ORDER — QUETIAPINE FUMARATE 25 MG PO TABS: 25.0000 mg | ORAL_TABLET | Freq: Two times a day (BID) | ORAL | Status: DC
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
## 2019-07-29 NOTE — Progress Notes (Signed)
Orthostatic Vitals complete

## 2019-07-29 NOTE — Progress Notes (Addendum)
   Subjective: HD#3   Overnight: None  Today, Andrew Chaney is seen at bedside today. He appears alert. Patient stories are incoherent. He reports that he is not doing very well. He states that he uses marijuana and smoke loud (high quality Marijuana)  Objective:  Vital signs in last 24 hours: Vitals:   07/28/19 0438 07/28/19 1326 07/28/19 2127 07/29/19 0455  BP: 111/77 120/80 100/74 (!) 123/98  Pulse: 95 67 93 86  Resp: 15 11 15 20   Temp: 98.4 F (36.9 C) 98.2 F (36.8 C) 97.9 F (36.6 C) 98.4 F (36.9 C)  TempSrc: Axillary Oral Oral Oral  SpO2: 99%  98% 97%  Weight:      Height:       Const: Alert and oriented to self only HEENT: Poor dentition CV: RRR, no murmurs, gallop, rub Abd: Bowel sounds present, nondistended, nontender to palpation Ext: No lower extremity edema, skin is warm to touch  Assessment/Plan:  Active Problems:   Acute encephalopathy   Seizure ([place])   Status epilepticus [facility])  Andrew Chaney is a 64 year old gentleman unhoused with medical history significant for bipolar 1 disorder, germ cell seroma, medical renal disease who presented with status epilepticus.  In the ED, he received fosphenytoin, Dilantin, Keppra and Ativan.  He was admitted to the ICU for closer observation as well as hypotension from July 16 to July 17 for which he was started on vasopressor. Improved and was transferred to the medical-tele unit    #Acute encephalopathy #Status epilepticus-resolved:  His seizures have resolved however he remains encephalopathic and suspecting underlying psychiatric disorder vs toxic encephalopathy from high-quality marijuana use.  Repeat MRI Brain with contrast still showed motion degraded study with persistent abnormal signal in the parasagittal left frontal lobe with differential including sequelae of seizure versus subacute infarct. -Continue Keppra, Dilantin -Appreciate neurology recommendations    #Right upper extremity swelling: He was noted to  have swelling of his right upper extremity today at the IV insertion site.  Likely secondary to infiltration. -Follow-up right upper extremity ultrasound to rule out DVT    #Hypotension: Stable Ruled out septic shock, adrenal insufficiency.  Orthostatic vitals performed this morning unremarkable.  Urine culture remains unremarkable, pending blood culture -Continue midodrine, slowly taper and monitor BP -Follow-up blood culture     #Prior Hx of thiamine deficiency: Thiamine level in 2014 was 7 -Continue high dose thiamine 500 mg TID x 3 days -Follow up thiamine level     #Bipolar 1 disorder #Unknown psychiatric disorder with delusions Start Seroquel 25 mg twice daily -Follow-up psychiatry recommendations -Hold medications for now -Home medications includes benztropine, Risperdal, trazodone and temazepam    #Hypophosphatemia: Resolved  #Hypomagnesemia: Resolved #Hypokalemia: IV repletion ongoing #Hypocalcemia: Follow-up ionized calcium   FEN: Clear liquid diet VTE ppx:SQ lovenox CODE STATUS:FULL   Prior to Admission Living Arrangement: Homeless shelter  Anticipated Discharge Location: Unknown  Barriers to Discharge: Medical Treatment  Dispo: Anticipated discharge in approximately 2-3 day(s).    Jean Rosenthal, MD 07/29/2019, 6:16 AM Pager: [phone] Internal Medicine Teaching Service After 5pm on weekdays and 1pm on weekends: On Call pager: [phone]

## 2019-07-29 NOTE — Progress Notes (Signed)
VASCULAR LAB PRELIMINARY  PRELIMINARY  PRELIMINARY  PRELIMINARY  Right upper extremity venous duplex completed.    Preliminary report:  See CV proc for preliminary results.  Madalyn Rob, RN, results.  Ozell Ferrera, RVT 07/29/2019, 6:45 PM

## 2019-07-30 ENCOUNTER — Inpatient Hospital Stay (HOSPITAL_COMMUNITY): Payer: Self-pay

## 2019-07-30 DIAGNOSIS — R509 Fever, unspecified: Secondary | ICD-10-CM | POA: Diagnosis present

## 2019-07-30 DIAGNOSIS — L03113 Cellulitis of right upper limb: Secondary | ICD-10-CM | POA: Diagnosis present

## 2019-07-30 LAB — PHENYTOIN LEVEL, TOTAL: Phenytoin Lvl: 11.7 ug/mL (ref 10.0–20.0)

## 2019-07-30 LAB — MAGNESIUM: Magnesium: 1.4 mg/dL — ABNORMAL LOW (ref 1.7–2.4)

## 2019-07-30 LAB — CBC
HCT: 39.4 % (ref 39.0–52.0)
Hemoglobin: 13.1 g/dL (ref 13.0–17.0)
MCH: 32.5 pg (ref 26.0–34.0)
MCHC: 33.2 g/dL (ref 30.0–36.0)
MCV: 97.8 fL (ref 80.0–100.0)
Platelets: 235 10*3/uL (ref 150–400)
RBC: 4.03 MIL/uL — ABNORMAL LOW (ref 4.22–5.81)
RDW: 13.8 % (ref 11.5–15.5)
WBC: 7 10*3/uL (ref 4.0–10.5)
nRBC: 0 % (ref 0.0–0.2)

## 2019-07-30 LAB — BASIC METABOLIC PANEL
Anion gap: 12 (ref 5–15)
BUN: <5 mg/dL — ABNORMAL LOW (ref 8–23)
CO2: 20 mmol/L — ABNORMAL LOW (ref 22–32)
Calcium: 8.7 mg/dL — ABNORMAL LOW (ref 8.9–10.3)
Chloride: 103 mmol/L (ref 98–111)
Creatinine, Ser: 0.74 mg/dL (ref 0.61–1.24)
GFR calc Af Amer: >60 mL/min (ref >60)
GFR calc non Af Amer: >60 mL/min (ref >60)
Glucose, Bld: 76 mg/dL (ref 70–99)
Potassium: 4.2 mmol/L (ref 3.5–5.1)
Sodium: 135 mmol/L (ref 135–145)

## 2019-07-30 LAB — CALCIUM, IONIZED: Calcium, Ionized, Serum: 5 mg/dL (ref 4.5–5.6)

## 2019-07-30 LAB — PHOSPHORUS: Phosphorus: 1.9 mg/dL — ABNORMAL LOW (ref 2.5–4.6)

## 2019-07-30 MED ORDER — CEFAZOLIN SODIUM-DEXTROSE 1-4 GM/50ML-% IV SOLN
1.0000 g | Freq: Three times a day (TID) | INTRAVENOUS | Status: DC
  Administered 2019-07-30 – 2019-07-31 (×3): 1 g via INTRAVENOUS
  Filled 2019-07-30 (×5): qty 50

## 2019-07-30 MED ORDER — ACETAMINOPHEN 325 MG PO TABS
650.0000 mg | ORAL_TABLET | Freq: Four times a day (QID) | ORAL | Status: DC | PRN
  Administered 2019-07-30 – 2019-07-31 (×2): 650 mg via ORAL
  Filled 2019-07-30 (×2): qty 2

## 2019-07-30 MED ORDER — MAGNESIUM SULFATE 2 GM/50ML IV SOLN
2.0000 g | Freq: Once | INTRAVENOUS | Status: AC
  Administered 2019-07-30: 2 g via INTRAVENOUS
  Filled 2019-07-30: qty 50

## 2019-07-30 MED ORDER — ACETAMINOPHEN 325 MG PO TABS
650.0000 mg | ORAL_TABLET | Freq: Once | ORAL | Status: AC
  Administered 2019-07-30: 650 mg via ORAL
  Filled 2019-07-30: qty 2

## 2019-07-30 MED ORDER — LACTATED RINGERS IV BOLUS
1000.0000 mL | Freq: Once | INTRAVENOUS | Status: AC
  Administered 2019-07-30: 1000 mL via INTRAVENOUS

## 2019-07-30 MED ORDER — K PHOS MONO-SOD PHOS DI & MONO 155-852-130 MG PO TABS
500.0000 mg | ORAL_TABLET | Freq: Three times a day (TID) | ORAL | Status: DC
  Administered 2019-07-30 – 2019-08-03 (×13): 500 mg via ORAL
  Filled 2019-07-30 (×13): qty 2

## 2019-07-30 MED ORDER — THIAMINE HCL 100 MG PO TABS
100.0000 mg | ORAL_TABLET | Freq: Every day | ORAL | Status: DC
  Administered 2019-08-01 – 2019-08-07 (×7): 100 mg via ORAL
  Filled 2019-07-30 (×7): qty 1

## 2019-07-30 MED ORDER — IOHEXOL 300 MG/ML  SOLN
100.0000 mL | Freq: Once | INTRAMUSCULAR | Status: AC | PRN
  Administered 2019-07-30: 100 mL via INTRAVENOUS

## 2019-07-30 NOTE — Progress Notes (Addendum)
NEUROLOGY PROGRESS NOTE   Subjective: Main complaint at this time is that he is very drowsy.  Exam: Vitals:   07/30/19 0400 07/30/19 1153  BP: 103/73 91/61  Pulse: 74 (!) 102  Resp: 17 16  Temp: 99.4 F (37.4 C) (!) 101.6 F (38.7 C)  SpO2: 97% 97%   Neuro:  Mental Status: Alert, oriented, thought content appropriate.  Speech fluent without evidence of aphasia.  Able to follow 3 step commands without difficulty. Cranial Nerves: II:  Visual fields grossly normal,  III,IV, VI: ptosis not present, extra-ocular motions intact bilaterally pupils equal, round, reactive to light and accommodation V,VII: smile symmetric, facial light touch sensation normal bilaterally VIII: hearing normal bilaterally IX,X: Palate rises midline XI: bilateral shoulder shrug XII: midline tongue extension Motor: 4/5 throughout Sensory: Pinprick and light touch intact throughout, bilaterally Cerebellar: normal finger-to-nose within normal limits and no ataxia    Medications:  Scheduled: . enoxaparin (LOVENOX) injection  40 mg Subcutaneous Q24H  . hydrOXYzine  25 mg Oral QHS  . midodrine  10 mg Oral TID WC  . paliperidone  6 mg Oral Daily  . phenytoin (DILANTIN) IV  100 mg Intravenous Q8H   Continuous: . sodium chloride 10 mL/hr at 07/28/19 0659  . levETIRAcetam 500 mg (07/30/19 0521)  . thiamine injection 500 mg (07/30/19 8299)    Pertinent Labs/Diagnostics:   MR BRAIN W WO CONTRAST-motion degraded study with persistent abnormal signal in the parasagittal left frontal lobe.  No abnormal enhancement.  Primary differential is sequela of ongoing seizures and subacute infarct (less typical to have no enhancement.)  Phosphorus 1.9 Magnesium 1.4   Etta Quill PA-C [facility] [phone]  Assessment:  64 year old male, who is homeless, has a past medical history of bipolar disorder, questionable alcohol abuse and withdrawal who is brought to the hospital after multiple seizures  and had further seizures while in the emergency room.  After patient was placed on EEG, it showed left frontal temporal seizures that were intermittent.  Initially started on Keppra however additional Dilantin was needed to eventually break seizures.  Patient did require ICU level care for hypotension which was likely induced by Dilantin.  Currently he seems much less anxious during exam.  On exam did not exhibit any psychosis or hallucination.  Psych has seen patient and has started Invega 6 mg daily for psychosis/delusions.  Impression: -Seizures, status epilepticus-resolved -MRI brain with left frontal area of signal abnormality-likely seizure edema and less likely stroke -Psychosis/delusions  Recommendations: Continue with current antiepileptic medications Continue recommendations by psychiatry   07/30/2019, 12:14 PM  NEUROHOSPITALIST ADDENDUM Performed a face to face diagnostic evaluation.   I have reviewed the contents of history and physical exam as documented by PA/ARNP/Resident and agree with above documentation.  I have discussed and formulated the above plan as documented. Edits to the note have been made as needed.  Patient poor historian, per chart review  presented with status epilepticus/frontotemporal seizures.  Has history of germ cell cancer.  Currently homeless. MRI brain shows signal change in parasaggital frontal lobe- likely seizure edema.  On exam he is alert and oriented x3.  Continue Keppra and Dilantin for now-if patient remains agitated may switch Keppra to alternative antiepileptic agent.   Reordered Dilantin level. We will order serum paraneoplastic panel given patient's history of germ cell cancer.  We will need to repeat MRI brain with and without contrast in 1 to 2 weeks.   Neurology will continue to follow   Karena Addison Carlen Rebuck MD  [facility] [phone]   If 7pm to 7am, please call on call as listed on AMION.

## 2019-07-30 NOTE — Progress Notes (Signed)
SLP Cancellation Note  Patient Details Name: Kahner Yanik MRN: [id number] DOB: [birth year]   Cancelled treatment:        ST following pt- new order acknowledged. Suspect for diet upgrade. He was just leaving for [place] when this SLP arrived. Will see pt tomorrow.    Houston Siren 07/30/2019, 3:13 PM  Orbie Pyo Colvin Caroli.Ed Risk analyst [phone] Office [phone]

## 2019-07-30 NOTE — Progress Notes (Addendum)
   Subjective:Patient is seen at bedside today. Patient is somnolent but does not appear in acute distress. He states that he has been sleeping for a long time and has not been eating. He states that his mouth is very dry and would like some Cool aid.   Objective:  Vital signs in last 24 hours: Vitals:   07/29/19 1504 07/29/19 2035 07/30/19 0016 07/30/19 0400  BP: 128/63 115/67 128/84 103/73  Pulse: 67 85  74  Resp: 15 15 11 17   Temp: 98.7 F (37.1 C) 98.3 F (36.8 C) 98.4 F (36.9 C) 99.4 F (37.4 C)  TempSrc: Axillary Oral Oral Oral  SpO2: 94% 95% 96% 97%  Weight:      Height:        Physical Exam Constitutional: no acute distress Ext: Right forearm swollen, erythematous not warm to touch   Assessment/Plan: Principal Problem:   Seizure ([facility]) Active Problems:   Acute encephalopathy   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[facility])   Status epilepticus ([facility])   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 Fortson a 64 year old gentlemanunhousedwith medical history significant for bipolar 1 disorder, germ cell seroma, medical renal disease who presented with status epilepticus. In the ED, he received fosphenytoin, Dilantin, Keppra and Ativan. He was admitted to the ICU for closer observation as well as hypotension from July 16 to July 17 for which he was started on vasopressor. Improved and was transferred to the medical-tele unit    #Acute encephalopathy #Status epilepticus-resolved -Continue Keppra, Dilantin -Appreciate neurology recommendations   #Febrile episode: DDx RUE Cellulitis vs thrombophlebitis.  He had a febrile episode with T-max of 101 around 11 AM this morning.  There is no signs of infection besides the swelling and erythema of his right upper extremity.  Right upper extremity ultrasound yesterday did not show evidence of DVT but it did show acute superficial vein thrombosis involving the right cephalic vein.  Prior blood cultures were  unremarkable -Apply warm compressions to right upper extremity -Follow-up CT right upper extremity  -Tylenol as needed fever -Follow-up chest x-ray, UA, blood cultures   #Hypotension: -Continue midodrine   #Prior Hx of thiamine deficiency: Thiamine level in 2014 was 7 -Continue high dose thiamine 500 mg TID x 3 days -Follow up thiamine level   #Bipolar 1 disorder #Unknown psychiatric disorder with delusions -Continue Invega, hydroxyzine -Appreciate social worker assistance -Follow-up with [facility] outpatient after discharge  #Hypophosphatemia: Oral repletion  #Hypomagnesemia: IV repletion #Hypokalemia: Resolved #Hypocalcemia: Follow-up ionized calcium   VEL:FYBOF liquid diet, SLP reevaluation VTE ppx:SQ lovenox CODE STATUS:FULL  Prior to Admission Living Arrangement:Homeless shelter Anticipated Discharge Location:Unknown Barriers to Discharge:Medical Treatment Dispo: Anticipated discharge in approximately2-3day(s).    Andrew Rancher, MD After 5pm on weekdays and 1pm on weekends: On Call pager [phone]    Date: 07/30/2019  Patient name: Andrew Chaney  Medical record number: [id number]  Date of birth: [birth year]        I have seen and evaluated this patient and I have discussed the plan of care with the house staff. Please see Dr. Evonnie Dawes for complete details. I concur with his findings and plan.    Sid Falcon, MD 07/30/2019, 3:36 PM

## 2019-07-31 DIAGNOSIS — R7881 Bacteremia: Secondary | ICD-10-CM

## 2019-07-31 DIAGNOSIS — R4182 Altered mental status, unspecified: Secondary | ICD-10-CM | POA: Diagnosis present

## 2019-07-31 DIAGNOSIS — G40901 Epilepsy, unspecified, not intractable, with status epilepticus: Principal | ICD-10-CM

## 2019-07-31 DIAGNOSIS — F1215 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: Secondary | ICD-10-CM

## 2019-07-31 DIAGNOSIS — L03113 Cellulitis of right upper limb: Secondary | ICD-10-CM

## 2019-07-31 DIAGNOSIS — B9562 Methicillin resistant Staphylococcus aureus infection as the cause of diseases classified elsewhere: Secondary | ICD-10-CM | POA: Diagnosis present

## 2019-07-31 DIAGNOSIS — B9561 Methicillin susceptible Staphylococcus aureus infection as the cause of diseases classified elsewhere: Secondary | ICD-10-CM

## 2019-07-31 LAB — CBC
HCT: 40.2 % (ref 39.0–52.0)
Hemoglobin: 13.4 g/dL (ref 13.0–17.0)
MCH: 32.5 pg (ref 26.0–34.0)
MCHC: 33.3 g/dL (ref 30.0–36.0)
MCV: 97.6 fL (ref 80.0–100.0)
Platelets: 179 10*3/uL (ref 150–400)
RBC: 4.12 MIL/uL — ABNORMAL LOW (ref 4.22–5.81)
RDW: 13.7 % (ref 11.5–15.5)
WBC: 3.5 10*3/uL — ABNORMAL LOW (ref 4.0–10.5)
nRBC: 0 % (ref 0.0–0.2)

## 2019-07-31 LAB — BLOOD CULTURE ID PANEL (REFLEXED)

## 2019-07-31 LAB — BASIC METABOLIC PANEL
Anion gap: 13 (ref 5–15)
BUN: 5 mg/dL — ABNORMAL LOW (ref 8–23)
CO2: 20 mmol/L — ABNORMAL LOW (ref 22–32)
Calcium: 8.6 mg/dL — ABNORMAL LOW (ref 8.9–10.3)
Chloride: 101 mmol/L (ref 98–111)
Creatinine, Ser: 0.88 mg/dL (ref 0.61–1.24)
GFR calc Af Amer: >60 mL/min (ref >60)
GFR calc non Af Amer: >60 mL/min (ref >60)
Glucose, Bld: 146 mg/dL — ABNORMAL HIGH (ref 70–99)
Potassium: 3.5 mmol/L (ref 3.5–5.1)
Sodium: 134 mmol/L — ABNORMAL LOW (ref 135–145)

## 2019-07-31 LAB — MISC LABCORP TEST (SEND OUT)

## 2019-07-31 LAB — HEPATITIS B CORE ANTIBODY, TOTAL: Hep B Core Total Ab: NONREACTIVE

## 2019-07-31 MED ORDER — LACTATED RINGERS IV SOLN: INTRAVENOUS | Status: AC

## 2019-07-31 MED ORDER — CEFAZOLIN SODIUM-DEXTROSE 2-4 GM/100ML-% IV SOLN
2.0000 g | Freq: Three times a day (TID) | INTRAVENOUS | Status: DC
  Administered 2019-07-31 – 2019-08-02 (×6): 2 g via INTRAVENOUS
  Filled 2019-07-31 (×8): qty 100

## 2019-07-31 MED ORDER — LACTATED RINGERS IV BOLUS
1000.0000 mL | Freq: Once | INTRAVENOUS | Status: AC
  Administered 2019-07-31: 1000 mL via INTRAVENOUS

## 2019-07-31 MED ORDER — LORAZEPAM 2 MG/ML IJ SOLN
1.0000 mg | INTRAMUSCULAR | Status: DC | PRN
  Administered 2019-07-31: 1 mg via INTRAVENOUS
  Filled 2019-07-31: qty 1

## 2019-07-31 NOTE — Progress Notes (Signed)
Patient became restless and agitated around 1700. PO Ativan 1 mg given per CIWA protocol. Around 1800, patient was standing up, digging in the trash, and had stool all over himself. MD was contacted who stated they would look into if he could get anything else to calm him down. Keeps saying "why is God doing this to me." When asking what we could do to help and if he could be more specific he mentioned that he was mad about his mashed potatoes having gravy on it. Cleaned patient up and ordered potatoes without gravy. Will continue to monitor and provide comfort.

## 2019-07-31 NOTE — Progress Notes (Signed)
Subjective: ON Events: Hypotension 70/50, 2L LR ordered was 103/60.   Andrew Chaney was seen at bedside this AM. Patient reports that he is tired, and his fatigue is similar to yesterday. He reports an accident of urinary incontinence. He denies N/V, diaphoresis, myalgias, or palpitations.    Objective:  Vital signs in last 24 hours: Vitals:   07/31/19 0621 07/31/19 0622 07/31/19 0623 07/31/19 0624  BP:      Pulse: 95 96 95 95  Resp: 18 17 17 18   Temp:      TempSrc:      SpO2: 95% 94% 95% 95%  Weight:      Height:        Physical Exam Constitutional:      General: He is not in acute distress.    Appearance: He is ill-appearing and diaphoretic.     Comments: Tired in appearance, able to answer questions appropriately, non diaphoretic.   Cardiovascular:     Rate and Rhythm: Normal rate and regular rhythm.     Pulses: Normal pulses.     Heart sounds: Normal heart sounds. No murmur heard.  No friction rub. No gallop.   Pulmonary:     Effort: Pulmonary effort is normal.     Breath sounds: Normal breath sounds. No wheezing, rhonchi or rales.  Abdominal:     General: Abdomen is flat. Bowel sounds are normal.     Tenderness: There is no abdominal tenderness.  Musculoskeletal:     Comments: Trace lower extremity edema.   Skin:    Comments: RUE with erythema,   Neurological:     Mental Status: He is alert.      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 In the ED, he received fosphenytoin, Dilantin, Keppra and Ativan. He was admitted to the ICU for closer observation as well as hypotension from July 16 to July 17 for which he was started on vasopressor. Improved and was transferred to the [facility] unit.   Acute Encephalopathy Status Epilepticus-Resolved -Continue Keppra, Dilantin -Appreciate neurology recommendations  RUE Cellulitis:  There is no signs of infection besides the swelling and erythema of his right upper extremity.  Right upper extremity ultrasound showed no evidence of DVT but it did show acute superficial vein thrombosis involving the right cephalic vein. BC came back for MSSA, patient empirically covered with Ancef. Leukocytopenic this AM with WBC of 3.5. May be 2/2 to medication side effect to Ancef vs infection, will continue to monitor CBC and vitals and if multiple cell lines diminish will change medication.  - WBC: 3.5 today - Continue Ancef (Day 2) - Trend CBC.  -Apply warm compressions to right upper extremity -Follow-up CT right upper extremity  -Tylenol as needed fever -Follow-up chest x-ray, UA, blood cultures  Hypotension: -Continue midodrine - Added LR 100 cc/h for maintaince fluid for  12 hours.  Prior Hx of thiamine deficiency: Thiamine level in 2014 was 7 -Continue high dose thiamine 500 mg TID x 3 days -Follow up thiamine level  Bipolar 1 disorder Unknown psychiatric disorder with delusions -Continue Invega, hydroxyzine -Appreciate social worker assistance -Follow-up with [facility] outpatient after discharge  Hypophosphatemia: Oral repletion  Hypomagnesemia: IV repletion Hypokalemia: Resolved Hypocalcemia: ionized calcium: 5.0   CVU:DTHY Liquid; Dysphagia 3 VTE ppx:SQ lovenox CODE STATUS:FULL  Prior to Admission Living Arrangement:Homeless  shelter Anticipated Discharge Location:Unknown Barriers to Discharge:Medical Treatment Dispo: Anticipated discharge in approximately2-3day(s).    Andrew Mercury, MD Pager: [phone] After 5pm on weekdays and 1pm on weekends: On Call pager [phone]

## 2019-07-31 NOTE — Consult Note (Signed)
[facility] for Infectious Disease    Date of Admission:  07/26/2019     Total days of antibiotics 2  Day 2 cefazolin           Reason for Consult: MSSA bacteremia     Referring Provider: [employer]  Primary Care Provider: Patient, No Pcp Per    Assessment: Andrew Chaney is a 64 y.o. male with recent admission 5 days ago for seizures. He had no signs of infection at that time. Hospital day 5 he had a fever and noted erythema and warmth around previous R AC PIV site. Blood cultures collected and he is growing MSSA in 1/4 bottles. He has been started on Cefazolin which we will continue.   He has either a transient bacteremia from infected PIV site or has developed septic thrombophlebitis; favor the latter with appearance of arm.  CT of the arm reviewed and no abscess or joint involvement. Would recommend ultrasound of the RUE to assess.  Would also check TTE.   Will check hepatitis panel for him so we can sort out what is needed outpatient. LFTs slightly elevated AST and TBili.  Leukopenia noted with WBC 3.5 - would continue to monitor daily for him.      Plan: 1. Continue cefazolin  2. Repeat blood cultures in AM ordered  3. Please check ultrasound of RUE to rule out septic clot  4. Follow Hepatitis panel  5. Follow AM CBC  6. 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 enoxaparin (LOVENOX) injection  40 mg Subcutaneous Q24H  . hydrOXYzine  25 mg Oral QHS  . midodrine  10 mg Oral TID WC  . paliperidone  6 mg Oral Daily  . phenytoin (DILANTIN) IV  100 mg Intravenous Q8H  . phosphorus  500 mg Oral TID  . [START ON 08/01/2019] thiamine  100 mg Oral Daily    HPI: Andrew Chaney is a 64 y.o. male admitted on 07/26/2019 with seizures and AMS.   PMHx includes bipolar type 1, depression, homeless currently at homeless shelter. He had seizures with 2 witnessed by EMS in route. CT of the head revealed low density medially anterior frontal lobe concerning for acute infarction>> contrasted MRI which was motion degrated study with persistent abnormal signal in left frontal lobe thought to be sequela of ongoing seizures and subacute infarct, less likely to be due to neoplasm or cerebritis.   "He caught hepatitis from a friend" in the past with a remote history of injection drug use with heroin. Never treated. Unclear of details.  Hard to get him to focus on current history for current medical problems as he is easily distracted with other social and legal things going on for him.     Blood cultures collected initially in ER no growth. Hospital day 5 he had a fever > 101 and was re cultured, now growing MSSA in 1/4 bottles.  UDS on admission + THC and benzos. ETOH (-)   Review of Systems: Review of Systems  Constitutional: Positive for chills and fever. Negative for malaise/fatigue.  Respiratory: Negative for cough and shortness of breath.   Cardiovascular: Positive for leg swelling.  Negative for chest pain.  Gastrointestinal: Negative for abdominal pain and vomiting.  Genitourinary: Negative.  Negative for dysuria.  Musculoskeletal: Negative for back pain and joint pain.  Skin: Positive for rash.  Neurological: Negative for focal weakness and weakness.  Psychiatric/Behavioral: Positive for substance abuse.    Past Medical History:  Diagnosis Date  . Bipolar 1 disorder ([place])   . 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 Renal disorder     Social History   Tobacco Use  . Smoking status: Current Every Day Smoker    Types: Cigarettes  . Smokeless tobacco: Never Used  Substance Use Topics  . Alcohol use: Yes  . Drug use: Yes    Types: Marijuana     Comment: States no longer uses marijuana because he is "on probation"    History reviewed. No pertinent family history. No Known Allergies  OBJECTIVE: Blood pressure (!) 89/57, pulse 99, temperature 98.7 F (37.1 C), temperature source Oral, resp. rate 20, height 6' (1.829 m), weight 77.8 kg, SpO2 97 %.  Physical Exam Constitutional:      Comments: Sitting in bed. No distress.   Eyes:     General: No scleral icterus.    Pupils: Pupils are equal, round, and reactive to light.  Cardiovascular:     Rate and Rhythm: Normal rate and regular rhythm.     Pulses: Normal pulses.     Heart sounds: No murmur heard.   Pulmonary:     Effort: Pulmonary effort is normal.     Breath sounds: Normal breath sounds.  Abdominal:     General: Bowel sounds are normal. There is no distension.     Palpations: Abdomen is soft.     Tenderness: There is no abdominal tenderness.  Musculoskeletal:     Comments: Has a hard time fully extending right elbow joint.   Skin:    Comments: Skin overlying R antecubital fossa is erythematous, warm and swollen. No fluctuance or drainage noted. Previous PIV site   Neurological:     Mental Status: He is alert.     Lab Results Lab Results  Component Value Date   WBC 3.5 (L) 07/31/2019   HGB 13.4 07/31/2019   HCT 40.2 07/31/2019   MCV 97.6 07/31/2019   PLT 179 07/31/2019    Lab Results  Component Value Date   CREATININE 0.88 07/31/2019   BUN 5 (L) 07/31/2019   NA 134 (L) 07/31/2019   K 3.5 07/31/2019   CL 101 07/31/2019   CO2 20 (L) 07/31/2019    Lab Results  Component Value Date   ALT 19 07/27/2019   AST 53 (H) 07/27/2019   ALKPHOS 28 (L) 07/27/2019   BILITOT 1.8 (H) 07/27/2019     Microbiology: Recent Results (from the past 240 hour(s))  SARS Coronavirus 2 by RT PCR (hospital order, performed in [facility] hospital lab) Nasopharyngeal Nasopharyngeal Swab     Status: None   Collection Time: 07/26/19  4:26 PM   Specimen: Nasopharyngeal Swab    Result Value Ref Range Status   SARS Coronavirus 2 NEGATIVE NEGATIVE Final    Comment: (NOTE) SARS-CoV-2 target nucleic acids are NOT DETECTED.  The SARS-CoV-2 RNA is generally detectable in upper and lower respiratory specimens during the acute phase of infection. The lowest concentration of SARS-CoV-2 viral copies this assay can detect is 250 copies / mL. A negative result does not preclude SARS-CoV-2 infection and should not be used as the sole basis for treatment  or other patient management decisions.  A negative result may occur with improper specimen collection / handling, submission of specimen other than nasopharyngeal swab, presence of viral mutation(s) within the areas targeted by this assay, and inadequate number of viral copies (<250 copies / mL). A negative result must be combined with clinical observations, patient history, and epidemiological information.  Fact Sheet for Patients:   StrictlyIdeas.no  Fact Sheet for Healthcare Providers: BankingDealers.co.za  This test is not yet approved or  cleared by the [place] FDA and has been authorized for detection and/or diagnosis of SARS-CoV-2 by FDA under an Emergency Use Authorization (EUA).  This EUA will remain in effect (meaning this test can be used) for the duration of the COVID-19 declaration under Section 564(b)(1) of the Act, 21 U.S.C. section 360bbb-3(b)(1), unless the authorization is terminated or revoked sooner.  Performed at [facility], [place] [address] [place], [place] [address]   Culture, blood (routine x 2)     Status: None (Preliminary result)   Collection Time: 07/27/19  5:26 AM   Specimen: BLOOD  Result Value Ref Range Status   Specimen Description BLOOD RIGHT HAND  Final   Special Requests   Final    BOTTLES DRAWN AEROBIC AND ANAEROBIC Blood Culture adequate volume   Culture   Final    NO GROWTH 4 DAYS Performed at [place], [place] [address], [place], [place] [address]    Report Status PENDING  Incomplete  Culture, Urine     Status: None   Collection Time: 07/27/19 12:52 PM   Specimen: Urine, Random  Result Value Ref Range Status   Specimen Description URINE, RANDOM  Final   Special Requests NONE  Final   Culture   Final    NO GROWTH Performed at [facility], [place] [address] [place], [place] [address]    Report Status 07/28/2019 FINAL  Final  MRSA PCR Screening     Status: None   Collection Time: 07/27/19  8:28 PM   Specimen: Nasal Mucosa; Nasopharyngeal  Result Value Ref Range Status   MRSA by PCR NEGATIVE NEGATIVE Final    Comment:        The GeneXpert MRSA Assay (FDA approved for NASAL specimens only), is one component of a comprehensive MRSA colonization surveillance program. It is not intended to diagnose MRSA infection nor to guide or monitor treatment for MRSA infections. Performed at [facility], [place] [address], [place], [place] [address]   Culture, blood (routine x 2)     Status: None (Preliminary result)   Collection Time: 07/30/19  2:05 PM   Specimen: BLOOD LEFT HAND  Result Value Ref Range Status   Specimen Description BLOOD LEFT HAND  Final   Special Requests   Final    BOTTLES DRAWN AEROBIC ONLY Blood Culture adequate volume   Culture  Setup Time   Final    GRAM POSITIVE COCCI IN CLUSTERS AEROBIC BOTTLE ONLY CRITICAL RESULT CALLED TO, READ BACK BY AND VERIFIED WITH: Park Breed [phone] FCP Performed at [facility], [place] [address] [place], [place] [address]    Culture GRAM POSITIVE COCCI  Final   Report Status PENDING  Incomplete  Culture, blood (routine x 2)     Status: None (Preliminary result)   Collection Time: 07/30/19  2:05 PM   Specimen: BLOOD LEFT HAND  Result Value Ref Range Status   Specimen Description BLOOD LEFT HAND  Final  Special Requests   Final    BOTTLES DRAWN AEROBIC ONLY Blood Culture adequate volume   Culture   Final     NO GROWTH < 24 HOURS Performed at [facility], [place] [address] [place], [place] [address]    Report Status PENDING  Incomplete  Blood Culture ID Panel (Reflexed)     Status: Abnormal   Collection Time: 07/30/19  2:05 PM  Result Value Ref Range Status   Enterococcus species NOT DETECTED NOT DETECTED Final   Listeria monocytogenes NOT DETECTED NOT DETECTED Final   Staphylococcus species DETECTED (A) NOT DETECTED Final    Comment: CRITICAL RESULT CALLED TO, READ BACK BY AND VERIFIED WITH: PHARMD JEREMY F. [phone] FCP    Staphylococcus aureus (BCID) DETECTED (A) NOT DETECTED Final    Comment: Methicillin (oxacillin) susceptible Staphylococcus aureus (MSSA). Preferred therapy is anti staphylococcal beta lactam antibiotic (Cefazolin or Nafcillin), unless clinically contraindicated. CRITICAL RESULT CALLED TO, READ BACK BY AND VERIFIED WITH: PHARMD JEREMY F. [phone] FCP    Methicillin resistance NOT DETECTED NOT DETECTED Final   Streptococcus species NOT DETECTED NOT DETECTED Final   Streptococcus agalactiae NOT DETECTED NOT DETECTED Final   Streptococcus pneumoniae NOT DETECTED NOT DETECTED Final   Streptococcus pyogenes NOT DETECTED NOT DETECTED Final   Acinetobacter baumannii NOT DETECTED NOT DETECTED Final   Enterobacteriaceae species NOT DETECTED NOT DETECTED Final   Enterobacter cloacae complex NOT DETECTED NOT DETECTED Final   Escherichia coli NOT DETECTED NOT DETECTED Final   Klebsiella oxytoca NOT DETECTED NOT DETECTED Final   Klebsiella pneumoniae NOT DETECTED NOT DETECTED Final   Proteus species NOT DETECTED NOT DETECTED Final   Serratia marcescens NOT DETECTED NOT DETECTED Final   Haemophilus influenzae NOT DETECTED NOT DETECTED Final   Neisseria meningitidis NOT DETECTED NOT DETECTED Final   Pseudomonas aeruginosa NOT DETECTED NOT DETECTED Final   Candida albicans NOT DETECTED NOT DETECTED Final   Candida glabrata NOT DETECTED NOT DETECTED Final   Candida  krusei NOT DETECTED NOT DETECTED Final   Candida parapsilosis NOT DETECTED NOT DETECTED Final   Candida tropicalis NOT DETECTED NOT DETECTED Final    Comment: Performed at [facility], [place] [address], [place], [place] [address]     Janene Madeira, MSN, NP-C [facility] for Infectious Disease [email or web address] Pager: [phone] Office: [place]: [phone]

## 2019-07-31 NOTE — Evaluation (Signed)
Clinical/Bedside Swallow Evaluation Patient Details  Name: Andrew Chaney MRN: [id number] Date of Birth: 09-22-1955  Today's Date: 07/31/2019 Time: SLP Start Time (ACUTE ONLY): 0745 SLP Stop Time (ACUTE ONLY): 0815 SLP Time Calculation (min) (ACUTE ONLY): 30 min  Past Medical History:  Past Medical History:  Diagnosis Date  . Bipolar 1 disorder ([place])   . 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 Renal disorder    Past Surgical History: History reviewed. No pertinent surgical history. HPI:  Andrew Chaney is a 64 year old male with a past medical history significant for bipolar 1 disease, germ cell seroma, homelessness, and medical renal disease with witnessed seizure activity at homeless shelter where he resides per MD notes.  Per chart EMS witnessed 2 seizures on arrival. Patient received Ativan by EMS.  Urine drug screen positive for benzodiazepines and THC on admission per notes.  Per neurology note, patient was seen in status epilepticus for which he received fosphenytoin, Dilantin, Keppra, and as needed pushes of Ativan. Swallow eval was ordered as pt failed [facility] screen.  .   Assessment / Plan / Recommendation Clinical Impression  Patient presents with significant improvement in swallowing due to his improved mentation. No focal CN deficits apparent re: swallowing skill.  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   No indication of aspiration or significant dysphagia noted.  Swallow appeared clinically timely including mastication.  Pt states he can not tolerate regular foods such as steak, salads and breads due to his poor dentition - therefore recommend dys3/thin.  Encouraged pt to follow general aspiration precautions.  Pt denies GERD.  Recommend dental care/tooth brushing am/pm.  SLP assisted pt to order his meal as he doesn't have glasses present at this time.  No SlP follow up indicated.  Thanks for this  consult. SLP Visit Diagnosis: Dysphagia, unspecified (R13.10)    Aspiration Risk  Mild aspiration risk    Diet Recommendation Thin liquid;Dysphagia 3 (Mech soft)   Liquid Administration via: Cup;Straw Supervision: Intermittent supervision to cue for compensatory strategies Compensations: Slow rate;Small sips/bites Postural Changes: Seated upright at 90 degrees;Remain upright for at least 30 minutes after po intake    Other  Recommendations Oral Care Recommendations: Oral care BID   Follow up Recommendations None      Frequency and Duration   n/a         Prognosis   n/a     Swallow Study   General Date of Onset: 07/28/19 HPI: Andrew Chaney is a 64 year old male with a past medical history significant for bipolar 1 disease, germ cell seroma, homelessness, and medical renal disease with witnessed seizure activity at homeless shelter where he resides per MD notes.  Per chart EMS witnessed 2 seizures on arrival. Patient received Ativan by EMS.  Urine drug screen positive for benzodiazepines and THC on admission per notes.  Per neurology note, patient was seen in status epilepticus for which he received fosphenytoin, Dilantin, Keppra, and as needed pushes of Ativan. Swallow eval was ordered as pt failed [facility] screen.  . Type of Study: Bedside Swallow Evaluation Previous Swallow Assessment: bse 2 days prior Diet Prior to this Study: Thin liquids (clears) Temperature Spikes Noted: No Respiratory Status: Room air History of Recent Intubation: No Behavior/Cognition: Alert;Cooperative;Other (Comment);Confused;Distractible;Requires cueing Oral Cavity Assessment: Within Functional Limits Oral Care Completed by SLP: Yes (provided pt with readied toothbrush) Oral Cavity - Dentition: Adequate natural  dentition Vision: Functional for self-feeding Self-Feeding Abilities: Able to feed self Patient Positioning: Upright in bed Baseline Vocal Quality: Normal Volitional Cough: Strong Volitional  Swallow: Able to elicit    Oral/Motor/Sensory Function Overall Oral Motor/Sensory Function: Within functional limits   Ice Chips Ice chips: Not tested   Thin Liquid Thin Liquid: Within functional limits Presentation: Self Fed;Straw    Nectar Thick Nectar Thick Liquid: Not tested   Honey Thick Honey Thick Liquid: Not tested   Puree Puree: Within functional limits Presentation: Self Fed;Spoon   Solid     Solid: Within functional limits      Andrew Chaney 07/31/2019,8:31 AM  Andrew Lime, MS [place] Office [phone]

## 2019-07-31 NOTE — Progress Notes (Signed)
PHARMACY - PHYSICIAN COMMUNICATION CRITICAL VALUE ALERT - BLOOD CULTURE IDENTIFICATION (BCID)  Andrew Chaney is an 64 y.o. male who presented to [facility] on 07/26/2019 with a chief complaint of seizure  Assessment:  64 year old man admitted 7/15 and has developed right arm swelling and fever from an IV.  Blood cultures drawn on 7/19 are showing one bottle positive for MSSA.    Name of physician (or Provider) Contacted: Dr. Bridgett Larsson paged.  ID consult team informed  Current antibiotics: cefazolin 1g IV q8h  Changes to prescribed antibiotics recommended: increase cefazolin to 2g IV q8h Recommendations accepted by provider  Results for orders placed or performed during the hospital encounter of 07/26/19  Blood Culture ID Panel (Reflexed) (Collected: 07/30/2019  2:05 PM)  Result Value Ref Range   Enterococcus species NOT DETECTED NOT DETECTED   Listeria monocytogenes NOT DETECTED NOT DETECTED   Staphylococcus species DETECTED (A) NOT DETECTED   Staphylococcus aureus (BCID) DETECTED (A) NOT DETECTED   Methicillin resistance NOT DETECTED NOT DETECTED   Streptococcus species NOT DETECTED NOT DETECTED   Streptococcus agalactiae NOT DETECTED NOT DETECTED   Streptococcus pneumoniae NOT DETECTED NOT DETECTED   Streptococcus pyogenes NOT DETECTED NOT DETECTED   Acinetobacter baumannii NOT DETECTED NOT DETECTED   Enterobacteriaceae species NOT DETECTED NOT DETECTED   Enterobacter cloacae complex NOT DETECTED NOT DETECTED   Escherichia coli NOT DETECTED NOT DETECTED   Klebsiella oxytoca NOT DETECTED NOT DETECTED   Klebsiella pneumoniae NOT DETECTED NOT DETECTED   Proteus species NOT DETECTED NOT DETECTED   Serratia marcescens NOT DETECTED NOT DETECTED   Haemophilus influenzae NOT DETECTED NOT DETECTED   Neisseria meningitidis NOT DETECTED NOT DETECTED   Pseudomonas aeruginosa NOT DETECTED NOT DETECTED   Candida albicans NOT DETECTED NOT DETECTED   Candida glabrata NOT DETECTED NOT DETECTED    Candida krusei NOT DETECTED NOT DETECTED   Candida parapsilosis NOT DETECTED NOT DETECTED   Candida tropicalis NOT DETECTED NOT DETECTED    [name] 07/31/2019  1:36 PM

## 2019-07-31 NOTE — Progress Notes (Signed)
   07/30/19 2026  Assess: MEWS Score  Temp 99.8 F (37.7 C)  BP (!) 100/54  Pulse Rate (!) 111  ECG Heart Rate (!) 111  Resp 18  Level of Consciousness Alert  SpO2 97 %  O2 Device Room Air  Assess: MEWS Score  MEWS Temp 0  MEWS Systolic 1  MEWS Pulse 2  MEWS RR 0  MEWS LOC 0  MEWS Score 3  MEWS Score Color Yellow  Assess: if the MEWS score is Yellow or Red  Were vital signs taken at a resting state? Yes  Focused Assessment Change from prior assessment (see assessment flowsheet) (patient HR increased from dayshift assessment)  Early Detection of Sepsis Score *See Row Information* Low  MEWS guidelines implemented *See Row Information* Yes  Treat  MEWS Interventions Administered prn meds/treatments;Other (Comment);Escalated (See documentation below)  Pain Scale 0-10  Pain Score 6  Pain Type Acute pain  Pain Location Head  Pain Orientation Right;Left  Pain Descriptors / Indicators Headache  Pain Frequency Intermittent  Pain Onset Gradual  Patients Stated Pain Goal 0  Pain Intervention(s) Medication (See eMAR)  Multiple Pain Sites No  Take Vital Signs  Increase Vital Sign Frequency  Yellow: Q 2hr X 2 then Q 4hr X 2, if remains yellow, continue Q 4hrs  Escalate  MEWS: Escalate Yellow: discuss with charge nurse/RN and consider discussing with provider and RRT  Notify: Charge Nurse/RN  Name of Charge Nurse/RN Notified Dorthula Rue (another RN on the floor)  Date Charge Nurse/RN Notified 07/30/19  Time Charge Nurse/RN Notified 2030  Notify: Provider  Provider Name/Title Wynetta Emery, MD ([facility])  Date Provider Notified 07/30/19  Time Provider Notified 2121 (When patient's BP dropped to 70/50 manually at 2118)  Notification Type Page  Notification Reason Change in status (Patient BP dropped to 70/50)  Response See new orders (1 bolus of LR)  Date of Provider Response 07/30/19  Time of Provider Response 2125  Notify: Rapid Response  Name of Rapid Response RN Notified Dave RN   Date Rapid Response Notified 07/30/19  Time Rapid Response Notified 2256 (After 1st LR bolus, BP only increased to 77/53)  Document  Patient Outcome Stabilized after interventions  Progress note created (see row info) Yes   During shift change, this RN was informed that any stimulation would cause the patient's HR to increase between 100-120. While assessing the patient at 2026, patient's HR remained between 102-112 with a BP of 100/54, which fired a yellow MEWS with a score of a 3. Yellow MEWS protocol implemented. Patient was lying in the bed trying to sleep, but would easily arouse to his name. He continued to stay alert and oriented to person, place, and time (which was his baseline since my first assessment). Upon rechecking the patient's BP at 2121 manually, his BP was 70/50. Notified Johnson,MD with IMTS. Order for 1 liter of LR ordered and administered. BP following 1 liter bolus was 77/53. Johnson,MD with IMTS notified of patient BP after bolus. 2nd liter of LR ordered and administered. BP following 2nd bolus was 103/60, 112/70, and 90/60 while sleeping.

## 2019-08-01 ENCOUNTER — Inpatient Hospital Stay (HOSPITAL_COMMUNITY): Payer: Self-pay

## 2019-08-01 DIAGNOSIS — R7881 Bacteremia: Secondary | ICD-10-CM

## 2019-08-01 LAB — CBC
HCT: 36.8 % — ABNORMAL LOW (ref 39.0–52.0)
Hemoglobin: 12.3 g/dL — ABNORMAL LOW (ref 13.0–17.0)
MCH: 32.2 pg (ref 26.0–34.0)
MCHC: 33.4 g/dL (ref 30.0–36.0)
MCV: 96.3 fL (ref 80.0–100.0)
Platelets: 170 10*3/uL (ref 150–400)
RBC: 3.82 MIL/uL — ABNORMAL LOW (ref 4.22–5.81)
RDW: 13.9 % (ref 11.5–15.5)
WBC: 6.3 10*3/uL (ref 4.0–10.5)
nRBC: 0 % (ref 0.0–0.2)

## 2019-08-01 LAB — MAGNESIUM: Magnesium: 1.3 mg/dL — ABNORMAL LOW (ref 1.7–2.4)

## 2019-08-01 LAB — ECHOCARDIOGRAM COMPLETE
Area-P 1/2: 9.37 cm2
Calc EF: 66.7 %
Height: 72 in
S' Lateral: 3.22 cm
Single Plane A2C EF: 65.4 %
Single Plane A4C EF: 68.8 %
Weight: 2744.29 oz

## 2019-08-01 LAB — BASIC METABOLIC PANEL
Anion gap: 8 (ref 5–15)
BUN: <5 mg/dL — ABNORMAL LOW (ref 8–23)
CO2: 24 mmol/L (ref 22–32)
Calcium: 8.5 mg/dL — ABNORMAL LOW (ref 8.9–10.3)
Chloride: 102 mmol/L (ref 98–111)
Creatinine, Ser: 0.61 mg/dL (ref 0.61–1.24)
GFR calc Af Amer: >60 mL/min (ref >60)
GFR calc non Af Amer: >60 mL/min (ref >60)
Glucose, Bld: 135 mg/dL — ABNORMAL HIGH (ref 70–99)
Potassium: 3.3 mmol/L — ABNORMAL LOW (ref 3.5–5.1)
Sodium: 134 mmol/L — ABNORMAL LOW (ref 135–145)

## 2019-08-01 LAB — HEPATITIS PANEL, ACUTE
HCV Ab: REACTIVE — AB
Hep A IgM: NONREACTIVE
Hep B C IgM: NONREACTIVE
Hepatitis B Surface Ag: NONREACTIVE

## 2019-08-01 LAB — CULTURE, BLOOD (ROUTINE X 2)
Culture: NO GROWTH
Special Requests: ADEQUATE

## 2019-08-01 LAB — PHOSPHORUS: Phosphorus: 2 mg/dL — ABNORMAL LOW (ref 2.5–4.6)

## 2019-08-01 LAB — VITAMIN B1: Vitamin B1 (Thiamine): 77.9 nmol/L (ref 66.5–200.0)

## 2019-08-01 MED ORDER — LORAZEPAM 2 MG/ML IJ SOLN
0.5000 mg | INTRAMUSCULAR | Status: DC | PRN
  Administered 2019-08-03 – 2019-08-04 (×3): 1 mg via INTRAVENOUS
  Filled 2019-08-01 (×4): qty 1

## 2019-08-01 MED ORDER — POTASSIUM CHLORIDE CRYS ER 20 MEQ PO TBCR: 20.0000 meq | EXTENDED_RELEASE_TABLET | Freq: Two times a day (BID) | ORAL | Status: DC

## 2019-08-01 MED ORDER — LORAZEPAM 2 MG/ML IJ SOLN
0.5000 mg | INTRAMUSCULAR | Status: DC | PRN
  Administered 2019-08-01 (×2): 1 mg via INTRAVENOUS
  Filled 2019-08-01 (×2): qty 1

## 2019-08-01 MED ORDER — MAGNESIUM SULFATE 4 GM/100ML IV SOLN
4.0000 g | Freq: Once | INTRAVENOUS | Status: AC
  Administered 2019-08-01: 4 g via INTRAVENOUS
  Filled 2019-08-01: qty 100

## 2019-08-01 MED ORDER — POTASSIUM CHLORIDE CRYS ER 20 MEQ PO TBCR
20.0000 meq | EXTENDED_RELEASE_TABLET | Freq: Two times a day (BID) | ORAL | Status: DC
  Administered 2019-08-01 – 2019-08-03 (×4): 20 meq via ORAL
  Filled 2019-08-01 (×4): qty 1

## 2019-08-01 MED ORDER — LORAZEPAM 2 MG/ML IJ SOLN: 1.0000 mg | INTRAMUSCULAR | Status: DC | PRN

## 2019-08-01 NOTE — Progress Notes (Signed)
  Echocardiogram 2D Echocardiogram has been performed.  Bobbye Charleston 08/01/2019, 10:27 AM

## 2019-08-01 NOTE — Progress Notes (Signed)
Patient eating. Patient has been pulling PIV out. Instructed nurse that patient will need mitts on along with PIV to be wrapped. Instructed to reconsult when meds due and pt have mitts on. VU. Fran Lowes, RN VAST

## 2019-08-01 NOTE — Progress Notes (Signed)
[facility]  Date of Admission:  07/26/2019      Total days of antibiotics 3, Cefazolin           ASSESSMENT: Andrew Chaney is a 64 y.o. male with MSSA bacteremia thought to be due to superficial septic thrombophlebitis of IV site. His erythema and ROM is improving. TTE is going to be done now - will follow for read. He will need at least 2 weeks of IV antibiotics pending echo.   His mental health / environmental needs are going to be challenging - psych has been called back to re-assess given worsening features.   Hep C Ab (+), all hep b studies negative. Will need Hep C RNA/genotype to rule out chronic vs cleared infection. If he needs treatment we can defer until mental health needs are met.    PLAN: 1. Follow Echo report 2. Follow repeat blood cultures 3. Continue Cefazolin  4. 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 enoxaparin (LOVENOX) injection  40 mg Subcutaneous Q24H  . hydrOXYzine  25 mg Oral QHS  . midodrine  10 mg Oral TID WC  . paliperidone  6 mg Oral Daily  . phenytoin (DILANTIN) IV  100 mg Intravenous Q8H  . phosphorus  500 mg Oral TID  . thiamine  100 mg Oral Daily    SUBJECTIVE: Mild improvement in pain / swelling over arm and ability to extend joint.  He otherwise speaks of other legal things and his meals/food.    Review of Systems: Review of Systems  Constitutional: Negative for fever and malaise/fatigue.  Respiratory: Negative for cough and shortness of breath.   Gastrointestinal: Negative for abdominal pain, diarrhea, nausea and vomiting.  Musculoskeletal: Positive for joint pain (R arm).    No Known  Allergies  OBJECTIVE: Vitals:   08/01/19 0400 08/01/19 0800 08/01/19 0850 08/01/19 1100  BP: 103/65 106/70 99/80 112/86  Pulse: 86 (!) 107 (!) 117 (!) 105  Resp: _0 (!) 23  Temp: 97.8 F (36.6 C) 99.2 F (37.3 C) 98.7 F (37.1 C) 99 F (37.2 C)  TempSrc: Axillary Oral Oral Oral  SpO2: 95% 94% 91% 93%  Weight:      Height:       Body mass index is 23.26 kg/m.  Physical Exam Constitutional:      Comments: Disheveled, babbling a bit  Cardiovascular:     Rate and Rhythm: Regular rhythm. Tachycardia present.  Pulmonary:     Effort: Pulmonary effort is normal. No respiratory distress.  Abdominal:     General: There is no distension.     Palpations: Abdomen is soft.  Musculoskeletal:     Comments: R arm with less erythema and swelling today. Slow but full extension of the elbow.   Skin:    General: Skin is warm and dry.     Capillary Refill: Capillary refill takes less than 2 seconds.  Neurological:     Mental Status: He is alert and oriented to person, place, and time.     Lab Results Lab Results  Component Value Date   WBC 6.3 08/01/2019   HGB  12.3 (L) 08/01/2019   HCT 36.8 (L) 08/01/2019   MCV 96.3 08/01/2019   PLT 170 08/01/2019    Lab Results  Component Value Date   CREATININE 0.61 08/01/2019   BUN <5 (L) 08/01/2019   NA 134 (L) 08/01/2019   K 3.3 (L) 08/01/2019   CL 102 08/01/2019   CO2 24 08/01/2019    Lab Results  Component Value Date   ALT 19 07/27/2019   AST 53 (H) 07/27/2019   ALKPHOS 28 (L) 07/27/2019   BILITOT 1.8 (H) 07/27/2019     Microbiology: Recent Results (from the past 240 hour(s))  SARS Coronavirus 2 by RT PCR (hospital order, performed in [place] hospital lab) Nasopharyngeal Nasopharyngeal Swab     Status: None   Collection Time: 07/26/19  4:26 PM   Specimen: Nasopharyngeal Swab  Result Value Ref Range Status   SARS Coronavirus 2 NEGATIVE NEGATIVE Final    Comment: (NOTE) SARS-CoV-2 target nucleic acids are NOT  DETECTED.  The SARS-CoV-2 RNA is generally detectable in upper and lower respiratory specimens during the acute phase of infection. The lowest concentration of SARS-CoV-2 viral copies this assay can detect is 250 copies / mL. A negative result does not preclude SARS-CoV-2 infection and should not be used as the sole basis for treatment or other patient management decisions.  A negative result may occur with improper specimen collection / handling, submission of specimen other than nasopharyngeal swab, presence of viral mutation(s) within the areas targeted by this assay, and inadequate number of viral copies (<250 copies / mL). A negative result must be combined with clinical observations, patient history, and epidemiological information.  Fact Sheet for Patients:   StrictlyIdeas.no  Fact Sheet for Healthcare Providers: BankingDealers.co.za  This test is not yet approved or  cleared by the [place] FDA and has been authorized for detection and/or diagnosis of SARS-CoV-2 by FDA under an Emergency Use Authorization (EUA).  This EUA will remain in effect (meaning this test can be used) for the duration of the COVID-19 declaration under Section 564(b)(1) of the Act, 21 U.S.C. section 360bbb-3(b)(1), unless the authorization is terminated or revoked sooner.  Performed at [facility], [place] [address], [place], [place] [address]   Culture, blood (routine x 2)     Status: None   Collection Time: 07/27/19  5:26 AM   Specimen: BLOOD  Result Value Ref Range Status   Specimen Description BLOOD RIGHT HAND  Final   Special Requests   Final    BOTTLES DRAWN AEROBIC AND ANAEROBIC Blood Culture adequate volume   Culture   Final    NO GROWTH 5 DAYS Performed at [facility], [address], [place], [place] [address]    Report Status 08/01/2019 FINAL  Final  Culture, Urine     Status: None   Collection Time: 07/27/19 12:52 PM    Specimen: Urine, Random  Result Value Ref Range Status   Specimen Description URINE, RANDOM  Final   Special Requests NONE  Final   Culture   Final    NO GROWTH Performed at [facility], [place] [address], [place], [place] [address]    Report Status 07/28/2019 FINAL  Final  MRSA PCR Screening     Status: None   Collection Time: 07/27/19  8:28 PM   Specimen: Nasal Mucosa; Nasopharyngeal  Result Value Ref Range Status   MRSA by PCR NEGATIVE NEGATIVE Final    Comment:  The GeneXpert MRSA Assay (FDA approved for NASAL specimens only), is one component of a comprehensive MRSA colonization surveillance program. It is not intended to diagnose MRSA infection nor to guide or monitor treatment for MRSA infections. Performed at [facility], [place] [address] [place], [place] [address]   Culture, blood (routine x 2)     Status: Abnormal (Preliminary result)   Collection Time: 07/30/19  2:05 PM   Specimen: BLOOD LEFT HAND  Result Value Ref Range Status   Specimen Description BLOOD LEFT HAND  Final   Special Requests   Final    BOTTLES DRAWN AEROBIC ONLY Blood Culture adequate volume   Culture  Setup Time   Final    GRAM POSITIVE COCCI IN CLUSTERS AEROBIC BOTTLE ONLY CRITICAL RESULT CALLED TO, READ BACK BY AND VERIFIED WITH: PHARMD [place] [phone] FCP    Culture (A)  Final    STAPHYLOCOCCUS AUREUS SUSCEPTIBILITIES TO FOLLOW Performed at [facility], [place] [address] [place], [place] [address]    Report Status PENDING  Incomplete  Culture, blood (routine x 2)     Status: None (Preliminary result)   Collection Time: 07/30/19  2:05 PM   Specimen: BLOOD LEFT HAND  Result Value Ref Range Status   Specimen Description BLOOD LEFT HAND  Final   Special Requests   Final    BOTTLES DRAWN AEROBIC ONLY Blood Culture adequate volume   Culture   Final    NO GROWTH 2 DAYS Performed at [facility], [place] [address], [place], [place] [address]    Report Status  PENDING  Incomplete  Blood Culture ID Panel (Reflexed)     Status: Abnormal   Collection Time: 07/30/19  2:05 PM  Result Value Ref Range Status   Enterococcus species NOT DETECTED NOT DETECTED Final   Listeria monocytogenes NOT DETECTED NOT DETECTED Final   Staphylococcus species DETECTED (A) NOT DETECTED Final    Comment: CRITICAL RESULT CALLED TO, READ BACK BY AND VERIFIED WITH: PHARMD JEREMY F. [phone] FCP    Staphylococcus aureus (BCID) DETECTED (A) NOT DETECTED Final    Comment: Methicillin (oxacillin) susceptible Staphylococcus aureus (MSSA). Preferred therapy is anti staphylococcal beta lactam antibiotic (Cefazolin or Nafcillin), unless clinically contraindicated. CRITICAL RESULT CALLED TO, READ BACK BY AND VERIFIED WITH: PHARMD JEREMY F. [phone] FCP    Methicillin resistance NOT DETECTED NOT DETECTED Final   Streptococcus species NOT DETECTED NOT DETECTED Final   Streptococcus agalactiae NOT DETECTED NOT DETECTED Final   Streptococcus pneumoniae NOT DETECTED NOT DETECTED Final   Streptococcus pyogenes NOT DETECTED NOT DETECTED Final   Acinetobacter baumannii NOT DETECTED NOT DETECTED Final   Enterobacteriaceae species NOT DETECTED NOT DETECTED Final   Enterobacter cloacae complex NOT DETECTED NOT DETECTED Final   Escherichia coli NOT DETECTED NOT DETECTED Final   Klebsiella oxytoca NOT DETECTED NOT DETECTED Final   Klebsiella pneumoniae NOT DETECTED NOT DETECTED Final   Proteus species NOT DETECTED NOT DETECTED Final   Serratia marcescens NOT DETECTED NOT DETECTED Final   Haemophilus influenzae NOT DETECTED NOT DETECTED Final   Neisseria meningitidis NOT DETECTED NOT DETECTED Final   Pseudomonas aeruginosa NOT DETECTED NOT DETECTED Final   Candida albicans NOT DETECTED NOT DETECTED Final   Candida glabrata NOT DETECTED NOT DETECTED Final   Candida krusei NOT DETECTED NOT DETECTED Final   Candida parapsilosis NOT DETECTED NOT DETECTED Final   Candida tropicalis NOT  DETECTED NOT DETECTED Final    Comment:  Performed at [facility], [place] [address], [place], [place] [address]     Janene Madeira, MSN, NP-C [facility] [email or web address] Pager: [phone] Office: [phone] [place]: [phone]

## 2019-08-01 NOTE — Progress Notes (Signed)
Pt was restless and agitated when given report by Bettina Gavia, RN. Noted to have elevated HR in the 120's as well as elevated temp. MD paged and alerted to MEWS being red. 1L fluid bolus administered per MD order as well as PO tylenol for temp. Will cont to monitor MEWS after interventions.

## 2019-08-01 NOTE — Progress Notes (Signed)
Pt remained without agitation after administration of ativan. Report given to [place], [place]. Pt currently speaking about breakfast order and wants to call the kitchen to make sure order is "right".  Assured pt that dietary was called and verified breakfast order. No s/s of distress noted and VSS.

## 2019-08-01 NOTE — Progress Notes (Signed)
   08/01/19 0850  Assess: MEWS Score  Temp 98.7 F (37.1 C)  BP 99/80  Pulse Rate (!) 117  ECG Heart Rate (!) 116  Resp 17  SpO2 91 %  Assess: MEWS Score  MEWS Temp 0  MEWS Systolic 1  MEWS Pulse 2  MEWS RR 0  MEWS LOC 0  MEWS Score 3  MEWS Score Color Yellow  Assess: if the MEWS score is Yellow or Red  Were vital signs taken at a resting state? Yes  Focused Assessment No change from prior assessment  Early Detection of Sepsis Score *See Row Information* Low  MEWS guidelines implemented *See Row Information* Yes  Take Vital Signs  Increase Vital Sign Frequency  Yellow: Q 2hr X 2 then Q 4hr X 2, if remains yellow, continue Q 4hrs  Escalate  MEWS: Escalate Yellow: discuss with charge nurse/RN and consider discussing with provider and RRT  Notify: Charge Nurse/RN  Name of Charge Nurse/RN Notified Lillia Abed  Date Charge Nurse/RN Notified 08/01/19  Time Charge Nurse/RN Notified 0902  Notify: Provider  Provider Name/Title On call doctor  Date Provider Notified 08/01/19  Notification Type Page  Notification Reason Other (Comment) (Per MEWS protocol)  Response Other (Comment)   On call doctor was paged @0905 , have not gotten a response from them. Will continue to monitor vital signs.

## 2019-08-01 NOTE — Progress Notes (Signed)
Phenytoin Initial Consult Indication: Status epilepticus  No Known Allergies  Patient Measurements: Height: 6' (182.9 cm) (per [facility] 09/2018) Weight: 77.8 kg (171 lb 8.3 oz) IBW/kg (Calculated) : 77.6   Body mass index is 23.26 kg/m.   Vital signs: Temp: 98.7 F (37.1 C) (07/21 0850) Temp Source: Oral (07/21 0850) BP: 99/80 (07/21 0850) Pulse Rate: 117 (07/21 0850)  Labs: No results found for: ALBUMIN, PHENYTOIN, PHENYTFREE Lab Results  Component Value Date   PHENYTOIN 11.7 07/30/2019   Estimated Creatinine Clearance: 106.4 mL/min (by C-G formula based on SCr of 0.61 mg/dL).   Medications:  Scheduled:  . enoxaparin (LOVENOX) injection  40 mg Subcutaneous Q24H  . hydrOXYzine  25 mg Oral QHS  . midodrine  10 mg Oral TID WC  . paliperidone  6 mg Oral Daily  . phenytoin (DILANTIN) IV  100 mg Intravenous Q8H  . phosphorus  500 mg Oral TID  . thiamine  100 mg Oral Daily    Assessment: 64 y.o male presented to [facility] ED on 7/15 with multiple seizures. Started on dilantin and keppra.  Treated with Keppra in the ED and EEG monitoring  Additonal fosphenytoin load (1250mg  x1 on 7/15) and benzo support (lorazepam x2) used in ED  Current regimen of Keppra 500mg  IV every 12 hours and phenytoin 100mg  IV every 8 hours, begun 7/16 am Phenytoin level post load 18.2 on 7/15 (Goal total phenytoin 10-20)  7/20: Dilantin level 11.7, alb 2.6, corrected 18.87, therapeutic level.  Significant potential drug interactions: N/a SLP evaluation done: Mild aspiration risk   Neurology noted 7/20 noted seizures, status epilepticus-resolved -MRI brain with left frontal area of signal abnormality-likely seizure edema and less likely stroke.    Goals of care:  Total phenytoin level: 10-20 mcg/ml Free phenytoin level: 1-2 mcg/ml  Plan:  Continue Phenytoin 100 mg IV TID , can change to oral dosing when appropriate per attending MD.  Nicole Cella, RPh Clinical Pharmacist Please check AMION for all [place] phone numbers After 10:00 PM, call [place] [phone] 08/01/2019,10:36 AM

## 2019-08-01 NOTE — Consult Note (Signed)
[place] Psychiatry Consult   Reason for Consult:  "Medication recommendations" Referring Physician:  Dr. 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 male patient.  Patient assessed by nurse practitioner.  Patient alert and oriented, participates actively in assessment.  Patient pleasant and cooperative during assessment. Patient denies suicidal and homicidal ideations.  Patient denies history of suicide attempts, denies history of self-harm behaviors.  Patient denies auditory visual hallucinations.  Patient does not appear to be exhibiting symptoms of paranoia. Patient denies current outpatient psychiatric provider. Patient reports he is currently homeless recently sleeping outside.  Patient denies access to weapons.  Patient reports he is currently unemployed.  Patient reports he was in jail from October 2020 until July 2021. Patient endorses substance use including marijuana, heroin and methamphetamine.  Patient endorses intravenous heroin use. Patient reports appetite is "okay".  Patient reports sleep is "good." Patient expresses interest in outpatient substance use treatment.   Past Psychiatric History: Bipolar disorder, alcohol use disorder, polysubstance use disorder  Risk to Self:  Denies Risk to  Others:  Denies Prior Inpatient Therapy:  : Behavioral health 03/2018 Prior Outpatient Therapy:  : Outpatient behavioral health 04/2018  Past Medical History:  Past Medical History:  Diagnosis Date  . Bipolar 1 disorder ([place])   . 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 Renal disorder    History reviewed. No pertinent surgical history. Family History: History reviewed. No pertinent family history. Family Psychiatric  History: None reported Social History:  Social History   Substance and Sexual Activity  Alcohol Use Yes     Social History   Substance and Sexual Activity  Drug Use Yes  . Types: Marijuana   Comment: States no longer uses marijuana because he is "on probation"    Social History   Socioeconomic History  . Marital status: Single    Spouse name: Not on file  . Number of children: Not on file  . Years of education: Not on file  . Highest education level: Not on file  Occupational History  . Not on file  Tobacco Use  . Smoking status: Current Every Day Smoker    Types: Cigarettes  . Smokeless tobacco: Never Used  Substance and Sexual Activity  . Alcohol use: Yes  . Drug use: Yes    Types: Marijuana    Comment: States no longer uses marijuana because he is "on probation"  . Sexual activity: Not Currently  Other Topics Concern  . Not on file  Social History Narrative  . Not on file   Social Determinants of Health   Financial Resource Strain:   . Difficulty of Paying Living Expenses:   Food Insecurity:   . Worried About Charity fundraiser in the Last Year:   .  Ran Out of Food in the Last Year:   Transportation Needs:   . Film/video editor (Medical):   Marland Kitchen Lack of Transportation (Non-Medical):   Physical Activity:   . Days of Exercise per Week:   . Minutes of Exercise per Session:   Stress:   . Feeling of Stress :   Social Connections:   . Frequency of Communication with Friends and Family:   . Frequency of Social Gatherings with Friends  and Family:   . Attends Religious Services:   . Active Member of Clubs or Organizations:   . Attends Archivist Meetings:   Marland Kitchen Marital Status:    Additional Social History:    Allergies:  No Known Allergies  Labs:  Results for orders placed or performed during the hospital encounter of 07/26/19 (from the past 48 hour(s))  Basic metabolic panel     Status: Abnormal   Collection Time: 07/30/19  9:37 AM  Result Value Ref Range   Sodium 135 135 - 145 mmol/L   Potassium 4.2 3.5 - 5.1 mmol/L   Chloride 103 98 - 111 mmol/L   CO2 20 (L) 22 - 32 mmol/L   Glucose, Bld 76 70 - 99 mg/dL    Comment: Glucose reference range applies only to samples taken after fasting for at least 8 hours.   BUN <5 (L) 8 - 23 mg/dL   Creatinine, Ser 0.74 0.61 - 1.24 mg/dL   Calcium 8.7 (L) 8.9 - 10.3 mg/dL   GFR calc non Af Amer >60 >60 mL/min   GFR calc Af Amer >60 >60 mL/min   Anion gap 12 5 - 15    Comment: Performed at [facility] [address] [place], [place] [address]  CBC     Status: Abnormal   Collection Time: 07/30/19  9:37 AM  Result Value Ref Range   WBC 7.0 4.0 - 10.5 K/uL   RBC 4.03 (L) 4.22 - 5.81 MIL/uL   Hemoglobin 13.1 13.0 - 17.0 g/dL   HCT 39.4 39 - 52 %   MCV 97.8 80.0 - 100.0 fL   MCH 32.5 26.0 - 34.0 pg   MCHC 33.2 30.0 - 36.0 g/dL   RDW 13.8 11.5 - 15.5 %   Platelets 235 150 - 400 K/uL   nRBC 0.0 0.0 - 0.2 %    Comment: Performed at [facility], [place] [address] [place], [place] [address]  Magnesium     Status: Abnormal   Collection Time: 07/30/19  9:37 AM  Result Value Ref Range   Magnesium 1.4 (L) 1.7 - 2.4 mg/dL    Comment: Performed at [facility] [address], [place], [place] [address]  Phosphorus     Status: Abnormal   Collection Time: 07/30/19  9:37 AM  Result Value Ref Range   Phosphorus 1.9 (L) 2.5 - 4.6 mg/dL    Comment: Performed at [facility] [address] [place], [place] [address]  Culture, blood (routine x 2)      Status: Abnormal (Preliminary result)   Collection Time: 07/30/19  2:05 PM   Specimen: BLOOD LEFT HAND  Result Value Ref Range   Specimen Description BLOOD LEFT HAND    Special Requests      BOTTLES DRAWN AEROBIC ONLY Blood Culture adequate volume   Culture  Setup Time      GRAM POSITIVE COCCI IN CLUSTERS AEROBIC BOTTLE ONLY CRITICAL RESULT CALLED TO, READ BACK BY AND VERIFIED WITH:  PHARMD JEREMY F. [id number] FCP    Culture (A)     STAPHYLOCOCCUS AUREUS SUSCEPTIBILITIES TO FOLLOW Performed at [facility], [place] [address], [place], [place] [address]    Report Status PENDING   Culture, blood (routine x 2)     Status: None (Preliminary result)   Collection Time: 07/30/19  2:05 PM   Specimen: BLOOD LEFT HAND  Result Value Ref Range   Specimen Description BLOOD LEFT HAND    Special Requests      BOTTLES DRAWN AEROBIC ONLY Blood Culture adequate volume   Culture      NO GROWTH 2 DAYS Performed at [facility], [place] [address], [place], [place] [address]    Report Status PENDING   Blood Culture ID Panel (Reflexed)     Status: Abnormal   Collection Time: 07/30/19  2:05 PM  Result Value Ref Range   Enterococcus species NOT DETECTED NOT DETECTED   Listeria monocytogenes NOT DETECTED NOT DETECTED   Staphylococcus species DETECTED (A) NOT DETECTED    Comment: CRITICAL RESULT CALLED TO, READ BACK BY AND VERIFIED WITH: PHARMD JEREMY F. [id number] FCP    Staphylococcus aureus (BCID) DETECTED (A) NOT DETECTED    Comment: Methicillin (oxacillin) susceptible Staphylococcus aureus (MSSA). Preferred therapy is anti staphylococcal beta lactam antibiotic (Cefazolin or Nafcillin), unless clinically contraindicated. CRITICAL RESULT CALLED TO, READ BACK BY AND VERIFIED WITH: PHARMD JEREMY F. [phone] [id number] FCP    Methicillin resistance NOT DETECTED NOT DETECTED   Streptococcus species NOT DETECTED NOT DETECTED   Streptococcus agalactiae NOT DETECTED NOT DETECTED   Streptococcus  pneumoniae NOT DETECTED NOT DETECTED   Streptococcus pyogenes NOT DETECTED NOT DETECTED   Acinetobacter baumannii NOT DETECTED NOT DETECTED   Enterobacteriaceae species NOT DETECTED NOT DETECTED   Enterobacter cloacae complex NOT DETECTED NOT DETECTED   Escherichia coli NOT DETECTED NOT DETECTED   Klebsiella oxytoca NOT DETECTED NOT DETECTED   Klebsiella pneumoniae NOT DETECTED NOT DETECTED   Proteus species NOT DETECTED NOT DETECTED   Serratia marcescens NOT DETECTED NOT DETECTED   Haemophilus influenzae NOT DETECTED NOT DETECTED   Neisseria meningitidis NOT DETECTED NOT DETECTED   Pseudomonas aeruginosa NOT DETECTED NOT DETECTED   Candida albicans NOT DETECTED NOT DETECTED   Candida glabrata NOT DETECTED NOT DETECTED   Candida krusei NOT DETECTED NOT DETECTED   Candida parapsilosis NOT DETECTED NOT DETECTED   Candida tropicalis NOT DETECTED NOT DETECTED    Comment: Performed at [facility] [address] [place], [place] [address]  Phenytoin level, total     Status: None   Collection Time: 07/30/19  6:57 PM  Result Value Ref Range   Phenytoin Lvl 11.7 10.0 - 20.0 ug/mL    Comment: Performed at [facility] [address] [place], [place] [address]  Miscellaneous [facility] test (send-out)     Status: None   Collection Time: 07/30/19  6:57 PM  Result Value Ref Range   [facility] test code PNPAB    [facility] test name PARANEOPLASTIC PANEL    Source ([facility]) SERUM GEL     Comment: Performed at [facility], [place] [address] [place], [place] [address]   Misc [facility] result COMMENT     Comment: (NOTE) Performed At: [name] [place], [place] [id number] Rush Farmer MD IH:[id number]   Basic metabolic panel     Status: Abnormal   Collection Time: 07/31/19  9:24 AM  Result Value Ref Range  Sodium 134 (L) 135 - 145 mmol/L   Potassium 3.5 3.5 - 5.1 mmol/L   Chloride 101 98 - 111 mmol/L   CO2 20 (L) 22 - 32 mmol/L   Glucose, Bld 146 (H)  70 - 99 mg/dL    Comment: Glucose reference range applies only to samples taken after fasting for at least 8 hours.   BUN 5 (L) 8 - 23 mg/dL   Creatinine, Ser 0.88 0.61 - 1.24 mg/dL   Calcium 8.6 (L) 8.9 - 10.3 mg/dL   GFR calc non Af Amer >60 >60 mL/min   GFR calc Af Amer >60 >60 mL/min   Anion gap 13 5 - 15    Comment: Performed at [facility] [address] [place], [place] [address]  CBC     Status: Abnormal   Collection Time: 07/31/19  9:24 AM  Result Value Ref Range   WBC 3.5 (L) 4.0 - 10.5 K/uL   RBC 4.12 (L) 4.22 - 5.81 MIL/uL   Hemoglobin 13.4 13.0 - 17.0 g/dL   HCT 40.2 39 - 52 %   MCV 97.6 80.0 - 100.0 fL   MCH 32.5 26.0 - 34.0 pg   MCHC 33.3 30.0 - 36.0 g/dL   RDW 13.7 11.5 - 15.5 %   Platelets 179 150 - 400 K/uL   nRBC 0.0 0.0 - 0.2 %    Comment: Performed at [facility], [place] [address], [place], [place] [address]  Hepatitis B core antibody, total     Status: None   Collection Time: 07/31/19  3:01 PM  Result Value Ref Range   Hep B Core Total Ab NON REACTIVE NON REACTIVE    Comment: Performed at [facility] [address] [place], [place] [address]    Current Facility-Administered Medications  Medication Dose Route Frequency Provider Last Rate Last Admin  . 0.9 %  sodium chloride infusion  250 mL Intravenous Continuous Merlene Laughter F, NP 10 mL/hr at 07/28/19 0659 Rate Change at 07/28/19 0659  . acetaminophen (TYLENOL) tablet 650 mg  650 mg Oral Q6H PRN Jean Rosenthal, MD   650 mg at 07/31/19 2048  . ceFAZolin (ANCEF) IVPB 2g/100 mL premix  2 g Intravenous Q8H Gilles Chiquito B, MD 200 mL/hr at 08/01/19 0458 2 g at 08/01/19 0458  . enoxaparin (LOVENOX) injection 40 mg  40 mg Subcutaneous Q24H Agyei, Obed K, MD   40 mg at 07/31/19 1703  . hydrOXYzine (ATARAX) 10 MG/5ML syrup 25 mg  25 mg Oral QHS Gilles Chiquito B, MD   25 mg at 07/31/19 2150  . levETIRAcetam (KEPPRA) IVPB 500 mg/100 mL premix  500 mg Intravenous Q12H Agyei, Obed K, MD 400 mL/hr  at 07/31/19 2050 500 mg at 07/31/19 2050  . LORazepam (ATIVAN) injection 0.5-1 mg  0.5-1 mg Intravenous Q3H PRN Agyei, Obed K, MD      . midodrine (PROAMATINE) tablet 10 mg  10 mg Oral TID WC Merlene Laughter F, NP   10 mg at 07/31/19 1702  . paliperidone (INVEGA) 24 hr tablet 6 mg  6 mg Oral Daily Akintayo, Mojeed, MD   6 mg at 07/31/19 0852  . phenytoin (DILANTIN) injection 100 mg  100 mg Intravenous Q8H Amie [name], MD   100 mg at 08/01/19 0457  . phosphorus (K PHOS NEUTRAL) tablet 500 mg  500 mg Oral TID Jean Rosenthal, MD   500 mg at 07/31/19 2048  . thiamine tablet 100 mg  100 mg Oral Daily Jean Rosenthal, MD        Musculoskeletal: Strength & Muscle Tone: within normal limits Gait & Station: unable to assess Patient leans: N/A  Psychiatric Specialty Exam: Physical Exam Vitals and nursing note reviewed.  Constitutional:      Appearance: He is well-developed.  HENT:     Head: Normocephalic.  Cardiovascular:     Rate and Rhythm: Normal rate.  Pulmonary:     Effort: Pulmonary effort is normal.  Neurological:     Mental Status: He is alert and oriented to person, place, and time.  Psychiatric:        Attention and Perception: Attention and perception normal.        Mood and Affect: Mood is anxious.        Speech: Speech is tangential.        Behavior: Behavior normal. Behavior is cooperative.        Thought Content: Thought content normal.        Cognition and Memory: Cognition and memory normal.        Judgment: Judgment is impulsive.     Review of Systems  Constitutional: Negative.   HENT: Negative.   Eyes: Negative.   Respiratory: Negative.   Cardiovascular: Negative.   Gastrointestinal: Negative.   Genitourinary: Negative.   Musculoskeletal: Negative.   Skin: Negative.   Neurological: Negative.   Psychiatric/Behavioral: The patient is nervous/anxious.     Blood pressure 99/80, pulse (!) 117, temperature 98.7 F (37.1 C), temperature source Oral, resp. rate 17,  height 6' (1.829 m), weight 77.8 kg, SpO2 91 %.Body mass index is 23.26 kg/m.  General Appearance: Disheveled  Eye Contact:  Good  Speech:  Clear and Coherent and Normal Rate  Volume:  Normal  Mood:  Euthymic  Affect:  Congruent  Thought Process:  Coherent, Goal Directed and Descriptions of Associations: Intact  Orientation:  Full (Time, Place, and Person)  Thought Content:  Logical  Suicidal Thoughts:  No  Homicidal Thoughts:  No  Memory:  Immediate;   Fair Recent;   Fair Remote;   Fair  Judgement:  Fair  Insight:  Lacking  Psychomotor Activity:  Normal  Concentration:  Concentration: Fair and Attention Span: Fair  Recall:  [employer] of Knowledge:  Fair  Language:  Fair  Akathisia:  No  Handed:  Right  AIMS (if indicated):     Assets:  Communication Skills Desire for Improvement Financial Resources/Insurance Resilience Social Support  ADL's:  Unable to assess   Cognition:  WNL  Sleep:        Treatment Plan Summary: Patient reviewed with Dr. Hampton Abbot.  Patient is a 64 year old male with history of bipolar disorder and alcohol use disorder who endorses polysubstance use disorder.  Patient is cooperative and pleasant upon my assessment.  Patient presents with intermittent episodes of tangential conversation.  Patient continues to deny depressive symptoms and self-harm thoughts.  Recommendations: -Recommend consider minimizing use of benzodiazepine medications as these medications could cause worsening agitation. -Consider increase dose of hydroxyzine 25 mg 3 times daily as needed -Consider social work consult to offer substance use treatment resources. -Continue Invega 6 mg daily  -Refer to outpatient psychiatry for medication management upon discharge  Disposition: No evidence of imminent risk to self or others at present.   Patient does not meet criteria for psychiatric inpatient admission. Supportive therapy provided about ongoing stressors.  Emmaline Kluver,  FNP 08/01/2019 8:58 AM

## 2019-08-01 NOTE — Progress Notes (Signed)
Subjective: ON Events: Several episodes of agitation ON, fever 102, required Ativan and IV Fluids.   Andrew Chaney was seen and evaluated at bedside this AM. He appears alert but stuttering when speaking. Patient's stories are not coherent and grandiose. Patient states that his fever has come down.   Objective:  Vital signs in last 24 hours: Vitals:   07/31/19 2200 07/31/19 2316 08/01/19 0000 08/01/19 0400  BP: (!) 108/57 (!) 102/59 101/66 103/65  Pulse: (!) 102 (!) 101 96 86  Resp: 18 20 (!) 21 18  Temp: 99.7 F (37.6 C) 99.6 F (37.6 C) 99.6 F (37.6 C) 97.8 F (36.6 C)  TempSrc: Oral Axillary Axillary Axillary  SpO2: 94% 93% 94% 95%  Weight:      Height:       Physical Exam Constitutional:      General: He is not in acute distress.    Appearance: He is not ill-appearing.  HENT:     Head: Normocephalic and atraumatic.  Cardiovascular:     Rate and Rhythm: Regular rhythm. Tachycardia present.     Pulses: Normal pulses.     Heart sounds: Normal heart sounds. No murmur heard.  No gallop.   Pulmonary:     Effort: Pulmonary effort is normal.     Breath sounds: Normal breath sounds. No wheezing, rhonchi or rales.  Abdominal:     General: Abdomen is flat. Bowel sounds are normal.     Tenderness: There is no abdominal tenderness.  Musculoskeletal:     Comments: RUE erythematous, with no drainage. Able to fully extend RUE today.   Neurological:     Mental Status: He is alert.  Psychiatric:     Comments: Grandiose ideas, non coherent, easily redirected, poor judgement. Anxious.        CBC Latest Ref Rng & Units 07/31/2019 07/30/2019 07/27/2019  WBC 4.0 - 10.5 K/uL 3.5(L) 7.0 7.1  Hemoglobin 13.0 - 17.0 g/dL 13.4 13.1 10.7(L)  Hematocrit 39 - 52 % 40.2 39.4 33.2(L)  Platelets 150 - 400 K/uL 179 235 248   BMP Latest Ref Rng & Units 07/31/2019 07/30/2019 07/29/2019  Glucose 70 - 99 mg/dL 146(H) 76 81  BUN 8 - 23 mg/dL 5(L) <5(L) <5(L)  Creatinine 0.61 - 1.24 mg/dL 0.88 0.74  0.79  Sodium 135 - 145 mmol/L 134(L) 135 137  Potassium 3.5 - 5.1 mmol/L 3.5 4.2 3.4(L)  Chloride 98 - 111 mmol/L 101 103 101  CO2 22 - 32 mmol/L 20(L) 20(L) 23  Calcium 8.9 - 10.3 mg/dL 8.6(L) 8.7(L) 8.6(L)    Assessment/Plan: Principal Problem:   MSSA bacteremia Active Problems:   Acute encephalopathy   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[facility])   Seizure ([facility])   Status epilepticus ([facility])   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 In the ED, he received fosphenytoin, Dilantin, Keppra and Ativan. He was admitted to the ICU for closer observation as well as hypotension from July 16 to July 17 for which he was started on vasopressor. Improved and was transferred to the [facility] unit.   Acute Encephalopathy Status Epilepticus-Resolved -Continue Keppra, Dilantin -Appreciate neurology recommendations  RUE Cellulitis:  Right upper extremity CT showed no evidence of DVT but it did show acute superficial vein thrombosis involving the right  cephalic vein. BC came back for MSSA, patient empirically covered with Ancef. Leukopenic yesterday. WBC wnl today, did fever ON to 102.8 F.  - WBC: 6.3  - Continue Ancef (Day 3) - Trend CBC  - Tylenol as needed fever  Hypotension: -Continue midodrine - Added LR 100 cc/h for maintaince fluid for 12 hours.  Prior Hx of thiamine deficiency:  -Continue high dose thiamine 500 mg TID x 3 days -Follow up thiamine level  Bipolar 1 disorder Unknown Psychiatric Disorder with Delusions Several episodes of agitation including smearing self with excrement, which required Ativan.  - Consult psychiatry for medication recommendations.  - Ativan 0.5-1 mg PRN Q3H for  agitation -Continue Invega, hydroxyzine -Appreciate social worker assistance -Follow-up with [facility] outpatient after discharge  Hypophosphatemia: Oral repletion  Hypomagnesemia: IV repletion Hypokalemia: Resolved Hypocalcemia: ionized calcium: 5.0   VFM:BBUY Liquid; Dysphagia 3 VTE ppx:SQ lovenox CODE STATUS:FULL  Prior to Admission Living Arrangement:Homeless shelter Anticipated Discharge Location:Unknown Barriers to Discharge:Medical Treatment Dispo: Anticipated discharge in approximately2-3day(s).    [name], MD Pager: [phone] After 5pm on weekdays and 1pm on weekends: On Call pager [phone]

## 2019-08-01 NOTE — Progress Notes (Signed)
Pt continued to be restless and agitated, MD ordered ativan to be given. Administered to pt and will cont to monitor. Pt currently is lying in bed with no s/s of distress.

## 2019-08-02 ENCOUNTER — Encounter (HOSPITAL_COMMUNITY): Payer: Self-pay | Admitting: Pulmonary Disease

## 2019-08-02 DIAGNOSIS — R000 Tachycardia, unspecified: Secondary | ICD-10-CM

## 2019-08-02 DIAGNOSIS — R509 Fever, unspecified: Secondary | ICD-10-CM

## 2019-08-02 LAB — BASIC METABOLIC PANEL
Anion gap: 8 (ref 5–15)
BUN: <5 mg/dL — ABNORMAL LOW (ref 8–23)
CO2: 24 mmol/L (ref 22–32)
Calcium: 8.4 mg/dL — ABNORMAL LOW (ref 8.9–10.3)
Chloride: 105 mmol/L (ref 98–111)
Creatinine, Ser: 0.61 mg/dL (ref 0.61–1.24)
GFR calc Af Amer: >60 mL/min (ref >60)
GFR calc non Af Amer: >60 mL/min (ref >60)
Glucose, Bld: 96 mg/dL (ref 70–99)
Potassium: 3.6 mmol/L (ref 3.5–5.1)
Sodium: 137 mmol/L (ref 135–145)

## 2019-08-02 LAB — APTT: aPTT: 29 seconds (ref 24–36)

## 2019-08-02 LAB — CULTURE, BLOOD (ROUTINE X 2): Special Requests: ADEQUATE

## 2019-08-02 LAB — CBC WITH DIFFERENTIAL/PLATELET
Abs Immature Granulocytes: 0.01 10*3/uL (ref 0.00–0.07)
Basophils Absolute: 0 10*3/uL (ref 0.0–0.1)
Basophils Relative: 1 %
Eosinophils Absolute: 0.1 10*3/uL (ref 0.0–0.5)
Eosinophils Relative: 1 %
HCT: 34.5 % — ABNORMAL LOW (ref 39.0–52.0)
Hemoglobin: 11.5 g/dL — ABNORMAL LOW (ref 13.0–17.0)
Immature Granulocytes: 0 %
Lymphocytes Relative: 25 %
Lymphs Abs: 1.1 10*3/uL (ref 0.7–4.0)
MCH: 32.8 pg (ref 26.0–34.0)
MCHC: 33.3 g/dL (ref 30.0–36.0)
MCV: 98.3 fL (ref 80.0–100.0)
Monocytes Absolute: 0.6 10*3/uL (ref 0.1–1.0)
Monocytes Relative: 14 %
Neutro Abs: 2.5 10*3/uL (ref 1.7–7.7)
Neutrophils Relative %: 59 %
Platelets: 191 10*3/uL (ref 150–400)
RBC: 3.51 MIL/uL — ABNORMAL LOW (ref 4.22–5.81)
RDW: 14.3 % (ref 11.5–15.5)
WBC: 4.2 10*3/uL (ref 4.0–10.5)
nRBC: 0 % (ref 0.0–0.2)

## 2019-08-02 LAB — MAGNESIUM: Magnesium: 1.4 mg/dL — ABNORMAL LOW (ref 1.7–2.4)

## 2019-08-02 LAB — PROTIME-INR
INR: 1 (ref 0.8–1.2)
Prothrombin Time: 12.7 seconds (ref 11.4–15.2)

## 2019-08-02 LAB — PHOSPHORUS: Phosphorus: 3.2 mg/dL (ref 2.5–4.6)

## 2019-08-02 MED ORDER — SODIUM CHLORIDE 0.9 % IV SOLN
2.0000 g | INTRAVENOUS | Status: DC
  Administered 2019-08-02 – 2019-08-03 (×7): 2 g via INTRAVENOUS
  Filled 2019-08-02 (×12): qty 2000

## 2019-08-02 MED ORDER — NAFCILLIN SODIUM 2 G IJ SOLR
2.0000 g | INTRAMUSCULAR | Status: DC
  Filled 2019-08-02 (×2): qty 2000

## 2019-08-02 MED ORDER — HYDROXYZINE HCL 10 MG/5ML PO SYRP
25.0000 mg | ORAL_SOLUTION | Freq: Three times a day (TID) | ORAL | Status: DC | PRN
  Administered 2019-08-02 – 2019-08-06 (×4): 25 mg via ORAL
  Filled 2019-08-02 (×5): qty 12.5

## 2019-08-02 MED ORDER — MAGNESIUM SULFATE 2 GM/50ML IV SOLN
2.0000 g | Freq: Once | INTRAVENOUS | Status: AC
  Administered 2019-08-02: 2 g via INTRAVENOUS
  Filled 2019-08-02: qty 50

## 2019-08-02 NOTE — Anesthesia Preprocedure Evaluation (Addendum)
Anesthesia Evaluation  Patient identified by MRN, date of birth, ID band Patient awake    Reviewed: Allergy & Precautions, NPO status , Patient's Chart, lab work & pertinent test results, reviewed documented beta blocker date and time   Airway Mallampati: I  TM Distance: >3 FB Neck ROM: Full    Dental  (+) Poor Dentition, Missing, Chipped, Dental Advisory Given   Pulmonary pneumonia, resolved, Current Smoker,    Pulmonary exam normal breath sounds clear to auscultation       Cardiovascular negative cardio ROS Normal cardiovascular exam Rhythm:Regular Rate:Normal     Neuro/Psych Seizures -, Well Controlled,  PSYCHIATRIC DISORDERS Bipolar Disorder Schizophrenia Hx/o psychosis Neuromuscular disease    GI/Hepatic negative GI ROS, (+)     substance abuse  marijuana use, Hepatitis -, C  Endo/Other  negative endocrine ROS  Renal/GU ARFRenal disease  negative genitourinary   Musculoskeletal negative musculoskeletal ROS (+)   Abdominal   Peds  Hematology  (+) anemia , Bacteremia of unknown etiology   Anesthesia Other Findings   Reproductive/Obstetrics                            Anesthesia Physical Anesthesia Plan  ASA: III  Anesthesia Plan: MAC   Post-op Pain Management:    Induction: Intravenous  PONV Risk Score and Plan: 0 and Propofol infusion and Treatment may vary due to age or medical condition  Airway Management Planned: Natural Airway and Nasal Cannula  Additional Equipment:   Intra-op Plan:   Post-operative Plan:   Informed Consent: I have reviewed the patients History and Physical, chart, labs and discussed the procedure including the risks, benefits and alternatives for the proposed anesthesia with the patient or authorized representative who has indicated his/her understanding and acceptance.     Dental advisory given  Plan Discussed with: CRNA and  Anesthesiologist  Anesthesia Plan Comments:        Anesthesia Quick Evaluation

## 2019-08-02 NOTE — Progress Notes (Signed)
[facility]  Date of Admission:  07/26/2019      Total days of antibiotics 4 Cefazolin           ASSESSMENT: Andrew Chaney is a 64 y.o. male with MSSA bacteremia thought to be due to superficial septic thrombophlebitis of IV site. Hard to see his site today with his behavior and preoccupation with other things going on during visit but seems to be able to extend the elbow joint.   He has a possible finding on transthoracic echo concerning for vegetation - agree with TEE to further characterize. Continue IV Cefazolin.   His mental health / environmental needs are going to be challenging - psych has been called back to re-assess given worsening features.   Hep C Ab (+), all hep b studies negative. Hep C RNA and genotype pending. If he needs treatment we can defer until mental health needs are met.    PLAN: 1. TEE  2. Continue Cefazolin  3. Follow hepatitis labs  4. 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 midodrine  10 mg Oral TID WC  . paliperidone  6 mg Oral Daily  . phenytoin (DILANTIN) IV  100 mg Intravenous Q8H  . phosphorus  500 mg Oral TID  . potassium chloride  20 mEq Oral BID  . thiamine  100 mg Oral Daily    SUBJECTIVE: Non-contributory today regarding speaking about his family, germ cell cancer and attempts to give [place] multiple phone numbers. He is distressed emotionally that he cannot notify his family members. Primary team has attempted and all numbers to voicemail only. The patient tells me that his brother "told him to stop talking to him" at one point.    Review of Systems: Review of Systems    Unable to perform ROS: Psychiatric disorder    No Known Allergies  OBJECTIVE: Vitals:   08/02/19 0011 08/02/19 0437 08/02/19 0744 08/02/19 1200  BP: 100/71 101/65 113/64 95/66  Pulse: 92 97 99 89  Resp: 18 17 12 18   Temp: 99.9 F (37.7 C) 99 F (37.2 C) 98.7 F (37.1 C) 98.2 F (36.8 C)  TempSrc: Oral Oral Oral Oral  SpO2: 98% 100% 100% 100%  Weight:      Height:       Body mass index is 23.26 kg/m.  Physical Exam Cardiovascular:     Rate and Rhythm: Regular rhythm. Tachycardia present.  Pulmonary:     Effort: Pulmonary effort is normal.     Breath sounds: Normal breath sounds.  Skin:    General: Skin is warm and dry.     Comments: RUE still looks pretty erythematous  Neurological:     General: No focal deficit present.     Mental Status: He is alert.     Motor: No weakness.  Psychiatric:     Comments: Babbling, manic      Lab Results Lab Results  Component Value Date   WBC 4.2 08/02/2019   HGB 11.5 (L) 08/02/2019   HCT 34.5 (L) 08/02/2019   MCV 98.3 08/02/2019   PLT 191 08/02/2019  Lab Results  Component Value Date   CREATININE 0.61 08/02/2019   BUN <5 (L) 08/02/2019   NA 137 08/02/2019   K 3.6 08/02/2019   CL 105 08/02/2019   CO2 24 08/02/2019    Lab Results  Component Value Date   ALT 19 07/27/2019   AST 53 (H) 07/27/2019   ALKPHOS 28 (L) 07/27/2019   BILITOT 1.8 (H) 07/27/2019     Microbiology: Recent Results (from the past 240 hour(s))  SARS Coronavirus 2 by RT PCR (hospital order, performed in [facility] hospital lab) Nasopharyngeal Nasopharyngeal Swab     Status: None   Collection Time: 07/26/19  4:26 PM   Specimen: Nasopharyngeal Swab  Result Value Ref Range Status   SARS Coronavirus 2 NEGATIVE NEGATIVE Final    Comment: (NOTE) SARS-CoV-2 target nucleic acids are NOT DETECTED.  The SARS-CoV-2 RNA is generally detectable in upper and lower respiratory specimens during the acute phase of infection. The lowest concentration of  SARS-CoV-2 viral copies this assay can detect is 250 copies / mL. A negative result does not preclude SARS-CoV-2 infection and should not be used as the sole basis for treatment or other patient management decisions.  A negative result may occur with improper specimen collection / handling, submission of specimen other than nasopharyngeal swab, presence of viral mutation(s) within the areas targeted by this assay, and inadequate number of viral copies (<250 copies / mL). A negative result must be combined with clinical observations, patient history, and epidemiological information.  Fact Sheet for Patients:   StrictlyIdeas.no  Fact Sheet for Healthcare Providers: BankingDealers.co.za  This test is not yet approved or  cleared by the [place] FDA and has been authorized for detection and/or diagnosis of SARS-CoV-2 by FDA under an Emergency Use Authorization (EUA).  This EUA will remain in effect (meaning this test can be used) for the duration of the COVID-19 declaration under Section 564(b)(1) of the Act, 21 U.S.C. section 360bbb-3(b)(1), unless the authorization is terminated or revoked sooner.  Performed at [facility], [place] [address], [place], [place] [address]   Culture, blood (routine x 2)     Status: None   Collection Time: 07/27/19  5:26 AM   Specimen: BLOOD  Result Value Ref Range Status   Specimen Description BLOOD RIGHT HAND  Final   Special Requests   Final    BOTTLES DRAWN AEROBIC AND ANAEROBIC Blood Culture adequate volume   Culture   Final    NO GROWTH 5 DAYS Performed at [facility], [address] [place], [place] [address]    Report Status 08/01/2019 FINAL  Final  Culture, Urine     Status: None   Collection Time: 07/27/19 12:52 PM   Specimen: Urine, Random  Result Value Ref Range Status   Specimen Description URINE, RANDOM  Final   Special Requests NONE  Final   Culture   Final    NO  GROWTH Performed at [facility], [place] [address] [place],  [address]    Report Status 07/28/2019 FINAL  Final  MRSA PCR Screening     Status: None   Collection Time: 07/27/19  8:28 PM   Specimen: Nasal Mucosa; Nasopharyngeal  Result Value Ref Range Status   MRSA by PCR NEGATIVE NEGATIVE Final    Comment:        The GeneXpert MRSA Assay (FDA approved for NASAL specimens only), is one component of a comprehensive MRSA colonization surveillance program. It is not  intended to diagnose MRSA infection nor to guide or monitor treatment for MRSA infections. Performed at [facility], [place] [address], [place], [place] [address]   Culture, blood (routine x 2)     Status: Abnormal   Collection Time: 07/30/19  2:05 PM   Specimen: BLOOD LEFT HAND  Result Value Ref Range Status   Specimen Description BLOOD LEFT HAND  Final   Special Requests   Final    BOTTLES DRAWN AEROBIC ONLY Blood Culture adequate volume   Culture  Setup Time   Final    GRAM POSITIVE COCCI IN CLUSTERS AEROBIC BOTTLE ONLY CRITICAL RESULT CALLED TO, READ BACK BY AND VERIFIED WITH: Park Breed [id number] FCP Performed at [facility], [place] [address], [place], [place] [address]    Culture STAPHYLOCOCCUS AUREUS (A)  Final   Report Status 08/02/2019 FINAL  Final   Organism ID, Bacteria STAPHYLOCOCCUS AUREUS  Final      Susceptibility   Staphylococcus aureus - MIC*    CIPROFLOXACIN <=0.5 SENSITIVE Sensitive     ERYTHROMYCIN <=0.25 SENSITIVE Sensitive     GENTAMICIN <=0.5 SENSITIVE Sensitive     OXACILLIN 0.5 SENSITIVE Sensitive     TETRACYCLINE <=1 SENSITIVE Sensitive     VANCOMYCIN <=0.5 SENSITIVE Sensitive     TRIMETH/SULFA <=10 SENSITIVE Sensitive     CLINDAMYCIN <=0.25 SENSITIVE Sensitive     RIFAMPIN <=0.5 SENSITIVE Sensitive     Inducible Clindamycin NEGATIVE Sensitive     * STAPHYLOCOCCUS AUREUS  Culture, blood (routine x 2)     Status: None (Preliminary result)   Collection  Time: 07/30/19  2:05 PM   Specimen: BLOOD LEFT HAND  Result Value Ref Range Status   Specimen Description BLOOD LEFT HAND  Final   Special Requests   Final    BOTTLES DRAWN AEROBIC ONLY Blood Culture adequate volume   Culture   Final    NO GROWTH 2 DAYS Performed at [facility], [place] [address] [place], [place] [address]    Report Status PENDING  Incomplete  Blood Culture ID Panel (Reflexed)     Status: Abnormal   Collection Time: 07/30/19  2:05 PM  Result Value Ref Range Status   Enterococcus species NOT DETECTED NOT DETECTED Final   Listeria monocytogenes NOT DETECTED NOT DETECTED Final   Staphylococcus species DETECTED (A) NOT DETECTED Final    Comment: CRITICAL RESULT CALLED TO, READ BACK BY AND VERIFIED WITH: PHARMD JEREMY F. [id number] FCP    Staphylococcus aureus (BCID) DETECTED (A) NOT DETECTED Final    Comment: Methicillin (oxacillin) susceptible Staphylococcus aureus (MSSA). Preferred therapy is anti staphylococcal beta lactam antibiotic (Cefazolin or Nafcillin), unless clinically contraindicated. CRITICAL RESULT CALLED TO, READ BACK BY AND VERIFIED WITH: PHARMD JEREMY F. [id number] FCP    Methicillin resistance NOT DETECTED NOT DETECTED Final   Streptococcus species NOT DETECTED NOT DETECTED Final   Streptococcus agalactiae NOT DETECTED NOT DETECTED Final   Streptococcus pneumoniae NOT DETECTED NOT DETECTED Final   Streptococcus pyogenes NOT DETECTED NOT DETECTED Final   Acinetobacter baumannii NOT DETECTED NOT DETECTED Final   Enterobacteriaceae species NOT DETECTED NOT DETECTED Final   Enterobacter cloacae complex NOT DETECTED NOT DETECTED Final   Escherichia coli NOT DETECTED NOT DETECTED Final   Klebsiella oxytoca NOT DETECTED NOT DETECTED Final   Klebsiella pneumoniae NOT DETECTED NOT DETECTED Final   Proteus species NOT DETECTED NOT DETECTED Final   Serratia marcescens NOT DETECTED NOT DETECTED Final  Haemophilus influenzae NOT DETECTED NOT DETECTED  Final   Neisseria meningitidis NOT DETECTED NOT DETECTED Final   Pseudomonas aeruginosa NOT DETECTED NOT DETECTED Final   Candida albicans NOT DETECTED NOT DETECTED Final   Candida glabrata NOT DETECTED NOT DETECTED Final   Candida krusei NOT DETECTED NOT DETECTED Final   Candida parapsilosis NOT DETECTED NOT DETECTED Final   Candida tropicalis NOT DETECTED NOT DETECTED Final    Comment: Performed at [facility], [place] [address], [place], [place] [address]     Janene Madeira, MSN, NP-C [email or web address] Pager: [phone] Office: [place]: [phone]

## 2019-08-02 NOTE — Progress Notes (Signed)
HOPI: Briefly, Andrew Chaney is a 64 year old male with a history of bipolar disorder, alcohol abuse and withdrawal admitted after multiple seizures both outside the hospitals and in the emergency room.  EEG demonstrated left frontotemporal seizures and MRI demonstrated abnormal signal in the parasagittal left frontal lobe with no enhancement.  He is currently on Dilantin and Keppra and is seizure-free.  In addition to his seizures, other hospital problems include MSSA bacteremia with potential vegetation.  He also has a known history of bipolar 1 disorder and he has been having episodes of agitation  Subjective: Endorses feeling down and depressed today. No weakness, no numbness. No further events documented that are concerning for a seizure.  Past History Past Medical History:  Diagnosis Date  . Bipolar 1 disorder ([place])   . 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 Renal disorder    History reviewed. No pertinent surgical history. History reviewed. No pertinent family history. Social History   Tobacco Use  . Smoking status: Current Every Day Smoker    Types: Cigarettes  . Smokeless tobacco: Never Used  Substance Use Topics  . Alcohol use: Yes  . Drug use: Yes    Types: Marijuana    Comment: States no longer uses marijuana because he is "on probation"   No Known Allergies   Medications Prior to Admission  Medication Sig Dispense Refill  . benztropine (COGENTIN) 0.5 MG tablet Take 1 tablet (0.5 mg total) by mouth 2 (two) times daily. 60 tablet 0  . chlorhexidine (PERIDEX) 0.12 % solution Use as directed 15 mLs in the mouth or throat 2 (two) times daily. 120 mL 0  . hydrOXYzine (ATARAX/VISTARIL) 50 MG tablet Take 1 tablet (50 mg total) by mouth 3 (three) times daily as needed for anxiety. 30 tablet 0  . risperiDONE (RISPERDAL) 2 MG tablet Take 1 tablet (2 mg total) by mouth 2 (two) times daily. 30 tablet 0  . temazepam (RESTORIL) 30 MG capsule Take 1 capsule (30 mg total) by  mouth at bedtime. 30 capsule 0  . traZODone (DESYREL) 100 MG tablet Take 1 tablet (100 mg total) by mouth at bedtime. 30 tablet 0     Temp:  [98.2 F (36.8 C)-99.9 F (37.7 C)] 98.2 F (36.8 C) (07/22 1200) Pulse Rate:  [89-101] 89 (07/22 1200) Resp:  [12-20] 18 (07/22 1200) BP: (95-113)/(64-71) 95/66 (07/22 1200) SpO2:  [98 %-100 %] 100 % (07/22 1200) Body mass index is 23.26 kg/m.  General: Laying comfortably in bed; in no acute distress. HENT: Normal oropharynx and mucosa. Normal external appearance of ears and nose Neck: Supple, no pain or tenderness. CV: RRR without murmur. No peripheral edema. Pulmonary: Symmetric chest rise. Normal respiratoy effort. Abdomen: positive bowel sounds, soft, non-tender. Normal liver and spleen size. Ext: No cyanosis, edema, or deformity. Skin: No rash. Normal palpation of skin. Musculoskeletal: Normal digits and nails by inspection. No Clubbing.  Neurologic Examination  MENTAL STATUS: AAOx3, LANG/SPEECH: fluent, no dysarthria CRANIAL NERVES: II: Pupils equal and reactive to light. Can count fingers when held up in front of him. III, IV, VI: EOM intact, no gaze preference or deviation, no nystagmus. V: normal sensation in V1, V2, and V3 segments bilaterally VII: no asymmetry, no nasolabial fold flattening VIII: normal hearing to speech IX, X: normal palatal elevation, no uvular deviation XI: 5/5 head turn and 5/5 shoulder shrug bilaterally XII: midline tongue protrusion  MOTOR: Unable to fully cooperate as he feels down and is not up  for it. Grip strength is 5/5 BL. Able to lift both legs off the bed and wiggles his toes.  REFLEXES: 0-1 throughout  SENSORY: Normal to light touch Coordination: declined to participate.  Imaging/Labs/Diagnostics: MRI Brain without contrast 07/28/19: IMPRESSION: Motion degraded study with persistent abnormal signal in the parasagittal left frontal lobe. No abnormal enhancement.  Primary  differential is sequelae of ongoing seizures and subacute infarct (less typical to have no enhancement). Neoplasm is much less likely and cerebritis is unlikely.  Impression and plan: Andrew Chaney is a 64 year old male with a history of bipolar disorder, alcohol abuse and withdrawal admitted after multiple seizures both outside the hospitals and in the emergency room.  EEG demonstrated left frontotemporal seizures and MRI demonstrated abnormal signal in the parasagittal left frontal lobe with no enhancement. Seizures well controlled on Duilantin 100mg  TID and Keppra 500mg  BID.  We did consider a potential autoimmune/paraneoplastic process but he does have a known hx of bipolar and medical illness can trigger worsening of known mood disorders. I did personally discuss an LP with patient. However he told me that "he is not up for it" and declined it. Unfortunately to be able to successfully do an LP at bedside, patient has to be cooperative. Patient uncooperative and moving during a bedside LP can result in complications from an LP.  Impression: -Seizures, status epilepticus-resolved -MRI brain with left frontal area of signal abnormality-likely seizure edema and less likely stroke -Psychosis/delusions -- Rule out Autoimmune encephalitis.  Plan: - Continue Dilantin 100mg  TID and Keppra 500mg  BID - We will follow up on paraneoplastic panel. - Agree with repeat MRI Brain for monitoring of the noted frontal T2/FLAIR hyperintensity. - Patient declined LP, will hold off on it for now. ______________________________________________________________________  [place] Pager Number [phone]   Thank you for the opportunity to take part in the care of this patient. If you have any further questions, please contact the neurology consultation attending on call. Signed,   Donnetta Simpers

## 2019-08-02 NOTE — Progress Notes (Signed)
   08/01/19 2005  Assess: MEWS Score  Temp 98.8 F (37.1 C)  BP 108/71  Pulse Rate (!) 101  ECG Heart Rate (!) 107  Resp 20  Level of Consciousness Alert  SpO2 100 %  O2 Device Room Air  Patient Activity (if Appropriate) In bed  Assess: MEWS Score  MEWS Temp 0  MEWS Systolic 0  MEWS Pulse 1  MEWS RR 0  MEWS LOC 0  MEWS Score 1  MEWS Score Color Green  Assess: if the MEWS score is Yellow or Red  Were vital signs taken at a resting state? Yes  Focused Assessment No change from prior assessment  Early Detection of Sepsis Score *See Row Information* Low  MEWS guidelines implemented *See Row Information* No, other (Comment) (no acute changes)  Document  Progress note created (see row info) Yes

## 2019-08-02 NOTE — Progress Notes (Signed)
Subjective:  No O/N events. Patient seen at bedside this AM. He is alert, pleasant, and no acute distress. Continues to stutter with continuous speech. He changes the subject often and his stories are incomprehensible. He reports he is hungry and would like some eggs or a cheese omelette.  Objective:  Vital signs in last 24 hours: Vitals:   08/01/19 2005 08/02/19 0011 08/02/19 0437 08/02/19 0744  BP: 108/71 100/71 101/65 113/64  Pulse: (!) 101 92 97 99  Resp: 20 18 17 12   Temp: 98.8 F (37.1 C) 99.9 F (37.7 C) 99 F (37.2 C) 98.7 F (37.1 C)  TempSrc: Oral Oral Oral Oral  SpO2: 100% 98% 100% 100%  Weight:      Height:       Physical Exam Constitutional:      General: He is not in acute distress. HENT:     Head: Normocephalic and atraumatic.  Eyes:     Conjunctiva/sclera: Conjunctivae normal.  Cardiovascular:     Rate and Rhythm: Normal rate and regular rhythm.     Pulses: Normal pulses.     Heart sounds: Normal heart sounds.  Pulmonary:     Effort: Pulmonary effort is normal.     Breath sounds: Normal breath sounds.  Abdominal:     General: Bowel sounds are normal. There is no distension.     Palpations: Abdomen is soft.     Tenderness: There is no abdominal tenderness.  Musculoskeletal:     Right lower leg: No edema.     Left lower leg: No edema.  Neurological:     Mental Status: He is alert.  Psychiatric:        Behavior: Behavior is cooperative.     Comments: Continuous speech, grandiose ideas.     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 Upon arrival in  ED, he received Ativan, fosphenytoin, Dilantin, and Keppra. Patient developed hypotension despite IVF and was transferred to ICU care requiring Levophed 7/16.  Hypotension subsequently improved and care was transferred back to IMTS.   #Acute Encephalopathy #Status Epilepticus, resolved No seizures since arrival on floor. Will continue anti-epileptics.  Difficult to determine patient's baseline mentation, but behavior remains strange. Neuro to perform LP, labs for autoimmune/infectious causes. -Continue Dilantin, Keppra -F/u Dilantin level -LP today -F/u neuro labs  #MSSA Bacteremia #RUE Cellulitis Patient developed erythema, swelling in RUE at IV insertion point 7/18. CT revealed cellulitis, no DVT per u/s. BCx returned MSSA, empirically covered with Ancef. TTE 7/21 showed mobile structure, appears to be attached to MV chord.  TEE pending to further evaluate, switching to IV nafcillin 2g q4. Appreciate ID recs. -IV nafcillin 2g -Tylenol PRN for fever -Cards consult for TEE -Pending r/p BCx  #Hypotension  BP continues to be soft, but MAP>75. Hypotension likely due to Dilantin. -Continue midodrine 10mg  TID  #Bipolar 1 disorder #Psychotic disorder with delusions Psych consulted for episodes of agitation requiring Ativan. Will continue consulting with psychiatry for optimal treatment options. -Continue Invega -Hydroxyzine 25mg  TID PRN -Ativan 0.5-1 mg q4 PRN -Outpatient psychiatry upon d/c  #Hepatitis C HCV  Ab positive, pending HCV RNA & genotype.  #Prior thiamine deficiency -Continue high dose thiamine 500mg  TID x3d (day 2)  #Hypomagnesemia Mg 1.4 this AM after receiving IV Mg 4g yesterday. -IV Mg 2g -F/u Mg  Diet: Thin liquid, dysphagia 3 IVF: n/a VTE PPX: Lovenox 40mg  Code Status: Full Prior to Admission Living Arrangement: Homeless shelter Anticipated Discharge Location: Unknown Barriers to Discharge: Medical Treatment Dispo: Anticipated discharge in approximately 2-3  day(s).   Sanjuan Dame, MD 08/02/2019, 8:38 AM Pager: [phone] After 5pm on weekdays and 1pm on weekends: On Call pager [phone]

## 2019-08-02 NOTE — Plan of Care (Signed)

## 2019-08-03 ENCOUNTER — Encounter (HOSPITAL_COMMUNITY)
Admission: EM | Disposition: A | Discharge status: Home / Self Care | Payer: Self-pay | Source: Home / Self Care | Attending: Internal Medicine

## 2019-08-03 ENCOUNTER — Encounter (HOSPITAL_COMMUNITY): Payer: Self-pay | Admitting: Pulmonary Disease

## 2019-08-03 ENCOUNTER — Inpatient Hospital Stay (HOSPITAL_COMMUNITY): Payer: Self-pay

## 2019-08-03 ENCOUNTER — Inpatient Hospital Stay (HOSPITAL_COMMUNITY): Payer: Self-pay | Admitting: Anesthesiology

## 2019-08-03 DIAGNOSIS — R9431 Abnormal electrocardiogram [ECG] [EKG]: Secondary | ICD-10-CM

## 2019-08-03 HISTORY — PX: TEE WITHOUT CARDIOVERSION: SHX5443

## 2019-08-03 LAB — CBC WITH DIFFERENTIAL/PLATELET
Abs Immature Granulocytes: 0.01 10*3/uL (ref 0.00–0.07)
Basophils Absolute: 0 10*3/uL (ref 0.0–0.1)
Basophils Relative: 1 %
Eosinophils Absolute: 0.1 10*3/uL (ref 0.0–0.5)
Eosinophils Relative: 2 %
HCT: 36.4 % — ABNORMAL LOW (ref 39.0–52.0)
Hemoglobin: 12.1 g/dL — ABNORMAL LOW (ref 13.0–17.0)
Immature Granulocytes: 0 %
Lymphocytes Relative: 34 %
Lymphs Abs: 1.1 10*3/uL (ref 0.7–4.0)
MCH: 32.9 pg (ref 26.0–34.0)
MCHC: 33.2 g/dL (ref 30.0–36.0)
MCV: 98.9 fL (ref 80.0–100.0)
Monocytes Absolute: 0.3 10*3/uL (ref 0.1–1.0)
Monocytes Relative: 11 %
Neutro Abs: 1.7 10*3/uL (ref 1.7–7.7)
Neutrophils Relative %: 52 %
Platelets: 206 10*3/uL (ref 150–400)
RBC: 3.68 MIL/uL — ABNORMAL LOW (ref 4.22–5.81)
RDW: 14.2 % (ref 11.5–15.5)
WBC: 3.2 10*3/uL — ABNORMAL LOW (ref 4.0–10.5)
nRBC: 0 % (ref 0.0–0.2)

## 2019-08-03 LAB — HEPATITIS C GENOTYPE

## 2019-08-03 LAB — BASIC METABOLIC PANEL
Anion gap: 7 (ref 5–15)
BUN: <5 mg/dL — ABNORMAL LOW (ref 8–23)
CO2: 25 mmol/L (ref 22–32)
Calcium: 8.8 mg/dL — ABNORMAL LOW (ref 8.9–10.3)
Chloride: 105 mmol/L (ref 98–111)
Creatinine, Ser: 0.64 mg/dL (ref 0.61–1.24)
GFR calc Af Amer: >60 mL/min (ref >60)
GFR calc non Af Amer: >60 mL/min (ref >60)
Glucose, Bld: 96 mg/dL (ref 70–99)
Potassium: 4.2 mmol/L (ref 3.5–5.1)
Sodium: 137 mmol/L (ref 135–145)

## 2019-08-03 LAB — HCV RNA QUANT

## 2019-08-03 LAB — MAGNESIUM: Magnesium: 2 mg/dL (ref 1.7–2.4)

## 2019-08-03 LAB — HEPATITIS C VRS RNA DETECT BY PCR-QUAL: Hepatitis C Vrs RNA by PCR-Qual: POSITIVE — AB

## 2019-08-03 LAB — HCV RNA (INTERNATIONAL UNITS)
HCV RNA (International Units): 11400000 IU/mL
HCV log10: 7.057 log10 IU/mL

## 2019-08-03 SURGERY — ECHOCARDIOGRAM, TRANSESOPHAGEAL
Anesthesia: Monitor Anesthesia Care

## 2019-08-03 MED ORDER — SODIUM CHLORIDE 0.9 % IV SOLN: INTRAVENOUS | Status: DC

## 2019-08-03 MED ORDER — PROPOFOL 10 MG/ML IV BOLUS
INTRAVENOUS | Status: DC | PRN
  Administered 2019-08-03 (×2): 25 mg via INTRAVENOUS

## 2019-08-03 MED ORDER — BUTAMBEN-TETRACAINE-BENZOCAINE 2-2-14 % EX AERO
INHALATION_SPRAY | CUTANEOUS | Status: DC | PRN
  Administered 2019-08-03: 2 via TOPICAL

## 2019-08-03 MED ORDER — PROPOFOL 500 MG/50ML IV EMUL
INTRAVENOUS | Status: DC | PRN
  Administered 2019-08-03: 200 ug/kg/min via INTRAVENOUS

## 2019-08-03 MED ORDER — CEFAZOLIN SODIUM-DEXTROSE 2-4 GM/100ML-% IV SOLN
2.0000 g | Freq: Three times a day (TID) | INTRAVENOUS | Status: DC
  Administered 2019-08-03 – 2019-08-05 (×7): 2 g via INTRAVENOUS
  Filled 2019-08-03 (×10): qty 100

## 2019-08-03 NOTE — Progress Notes (Signed)
  Echocardiogram 2D Echocardiogram has been performed.  Andrew Chaney 08/03/2019, 12:35 PM

## 2019-08-03 NOTE — Anesthesia Postprocedure Evaluation (Signed)
Anesthesia Post Note  Patient: Andrew Chaney  Procedure(s) Performed: TRANSESOPHAGEAL ECHOCARDIOGRAM (TEE) (N/A )     Patient location during evaluation: PACU Anesthesia Type: MAC Level of consciousness: awake and alert and oriented Pain management: pain level controlled Vital Signs Assessment: post-procedure vital signs reviewed and stable Respiratory status: spontaneous breathing, nonlabored ventilation and respiratory function stable Cardiovascular status: stable and blood pressure returned to baseline Postop Assessment: no apparent nausea or vomiting Anesthetic complications: no   No complications documented.  Last Vitals:  Vitals:   08/03/19 1113 08/03/19 1221  BP: (!) 99/64 (!) 103/62  Pulse: 79   Resp: 18 12  Temp: 37.1 C 36.7 C  SpO2: 100% 100%    Last Pain:  Vitals:   08/03/19 1221  TempSrc: Temporal  PainSc: 0-No pain                 Micaila Ziemba A.

## 2019-08-03 NOTE — CV Procedure (Signed)
TRANSESOPHAGEAL ECHOCARDIOGRAM (TEE) NOTE  INDICATIONS: infective endocarditis  PROCEDURE:   Informed consent was obtained prior to the procedure. The risks, benefits and alternatives for the procedure were discussed and the patient comprehended these risks.  Risks include, but are not limited to, cough, sore throat, vomiting, nausea, somnolence, esophageal and stomach trauma or perforation, bleeding, low blood pressure, aspiration, pneumonia, infection, trauma to the teeth and death.    After a procedural time-out, the patient was given propofol per anesthesia for sedation.  The patient's heart rate, blood pressure, and oxygen saturation are monitored continuously during the procedure.The oropharynx was anesthetized with topical cetacaine.  The transesophageal probe was inserted in the esophagus and stomach without difficulty and multiple views were obtained.  The patient was kept under observation until the patient left the procedure room.  I was present face-to-face 100% of this time. The patient left the procedure room in stable condition.   Agitated microbubble saline contrast was administered.  COMPLICATIONS:    There were no immediate complications.  Findings:  1. LEFT VENTRICLE: The left ventricular wall thickness is normal.  The left ventricular cavity is normal in size. Wall motion is normal.  LVEF is 60-65%.  2. RIGHT VENTRICLE:  The right ventricle is normal in structure and function without any thrombus or masses.    3. LEFT ATRIUM:  The left atrium is normal in size without any thrombus or masses.  There is not spontaneous echo contrast ("smoke") in the left atrium consistent with a low flow state.  4. LEFT ATRIAL APPENDAGE:  The left atrial appendage is free of any thrombus or masses. The appendage has single lobes. Pulse doppler indicates moderate flow in the appendage.  5. ATRIAL SEPTUM:  The atrial septum appears intact and is free of thrombus and/or masses.  There is  no evidence for interatrial shunting by color doppler and saline microbubble.  6. RIGHT ATRIUM:  The right atrium is normal in size and function without any thrombus or masses.  7. MITRAL VALVE:  The mitral valve is normal in structure and function with trivial regurgitation.  There were no vegetations or stenosis.  8. AORTIC VALVE:  The aortic valve is trileaflet, normal in structure and function with no regurgitation.  There were no vegetations or stenosis  9. TRICUSPID VALVE:  The tricuspid valve is normal in structure and function with trivial regurgitation.  There were no vegetations or stenosis  10.  PULMONIC VALVE:  The pulmonic valve is normal in structure and function with no regurgitation.  There were no vegetations or stenosis.   11. AORTIC ARCH, ASCENDING AND DESCENDING AORTA:  There was no Ron Parker et. Al, 1992) atherosclerosis of the ascending aorta, aortic arch, or proximal descending aorta.  12. PULMONARY VEINS: Anomalous pulmonary venous return was not noted.  13. PERICARDIUM: The pericardium appeared normal and non-thickened.  There is no pericardial effusion.  IMPRESSION:   1. Negative for endocarditis 2. No LAA thrombus 3. Negative for PFO 4. LVEF 60-65%  RECOMMENDATIONS:    1.  Management of MSSA bacteremia without endocarditis per ID recs.  Time Spent Directly with the Patient:  45 minutes   Pixie Casino, MD, [facility], [place] Director of the [facility]  [facility] Diplomate of the American Board of Clinical Lipidology Attending Cardiologist  Direct Dial: [phone]  Fax: [phone]  Website:  [email or web address] 08/03/2019, 12:16 PM

## 2019-08-03 NOTE — Anesthesia Procedure Notes (Signed)
Procedure Name: MAC Date/Time: 08/03/2019 12:07 PM Performed by: Imagene Riches, CRNA Pre-anesthesia Checklist: Patient identified, Emergency Drugs available, Suction available, Patient being monitored and Timeout performed Patient Re-evaluated:Patient Re-evaluated prior to induction Oxygen Delivery Method: Nasal cannula

## 2019-08-03 NOTE — Progress Notes (Signed)
Subjective:   Patient is a seen at bedside. Patient is somnolent but respond with verbal stimulation. He is more calm, and no activated mood observed. He reports that he did not have much sleep last night. He denies abdominal pain.  Objective:  Vital signs in last 24 hours: Vitals:   08/02/19 1640 08/03/19 0032 08/03/19 0313 08/03/19 0400  BP: (!) 90/59 117/82 104/73 102/71  Pulse: 77 80 76 67  Resp: 15 16 13 14   Temp: 98.7 F (37.1 C) 98.4 F (36.9 C) 97.9 F (36.6 C) 98.1 F (36.7 C)  TempSrc: Oral Oral Oral Oral  SpO2: 99% 99% 100% 100%  Weight:   83.5 kg   Height:       Physical Exam Constitutional:      General: He is not in acute distress. HENT:     Head: Normocephalic and atraumatic.  Cardiovascular:     Rate and Rhythm: Normal rate and regular rhythm.     Pulses: Normal pulses.     Heart sounds: Normal heart sounds.  Pulmonary:     Effort: Pulmonary effort is normal.     Breath sounds: Normal breath sounds.  Abdominal:     General: Bowel sounds are normal. There is no distension.     Palpations: Abdomen is soft.     Tenderness: There is no abdominal tenderness.  Musculoskeletal:     Right lower leg: No edema.     Left lower leg: No edema.  Neurological:     Mental Status: He is alert.  Psychiatric:        Behavior: Behavior is cooperative.     Comments: Stuttered speech.    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 Pitner is 64yo homeless male with bipolar 1 disorder admitted with status epilepticus. Upon arrival in ED, he received Ativan, fosphenytoin, Dilantin, and Keppra. Patient developed hypotension despite IVF and was transferred to  ICU care requiring Levophed 7/16.  Hypotension subsequently improved and care was transferred back to IMTS.   #Acute Encephalopathy #Status Epilepticus, resolved No seizures since arrival on floor. Will continue anti-epileptics. Patient refused LP from neuro yesterday, they plan to follow-up with paraneoplastic panel to rule out autoimmune encephalitis. Also plan to follow-up with repeat MRI Brain. Patient's mental status improved this AM, more coherent speech.  -Continue Dilantin, Keppra -F/u Dilantin level  #MSSA Bacteremia without endocarditis #RUE Cellulitis Patient developed erythema, swelling in RUE at IV insertion point 7/18. CT revealed cellulitis, no DVT per u/s. BCx returned MSSA, empirically covered with Ancef. TTE 7/21 showed mobile structure, appears to be attached to MV chord. Switched abx to nafcillin 2g. TEE today revealed no vegetations. Will continue IV nafcillin for MSSA bacteremia, appreciate ID recs for continued management recommendations. -IV nafcillin 2g -Tylenol PRN for fever -Pending r/p BCx  #Hypotension  BP continues to be soft, but MAP>75. Hypotension likely due to Dilantin. -Continue midodrine 10mg  TID  #Bipolar 1 disorder #Psychotic disorder with delusions Psych consulted for episodes of agitation requiring Ativan. Patient's mental status appears to have improved this AM. Will continue consulting with psychiatry for optimal treatment options. -Continue Invega -Hydroxyzine 25mg  TID PRN preferred over benzos -Ativan  0.5-1 mg q4 PRN -Outpatient psychiatry upon d/c  #Hepatitis C HCV Ab positive, HCV RNA w/ 11 -Follow-up ID for further hepC management  #Prior thiamine deficiency -Last day of high dose thiamine 500mg  TID x3d (day 3)  #Hypomagnesemia - resolved Mg 2.0 this AM s/p IV Mg 4mg  yesterday. -F/u Mg  Diet: Thin liquid, dysphagia 3 IVF: n/a VTE PPX: Lovenox 40mg  Code Status: Full Prior to Admission Living Arrangement: Homeless  shelter Anticipated Discharge Location: Unknown Barriers to Discharge: Medical Treatment Dispo: Anticipated discharge in approximately 2-3 day(s).  Prior to Admission Living Arrangement: Anticipated Discharge Location: Barriers to Discharge: Dispo: Anticipated discharge in approximately 2-3 day(s).   Sanjuan Dame, MD 08/03/2019, 7:12 AM Pager: [phone] After 5pm on weekdays and 1pm on weekends: On Call pager [phone]

## 2019-08-03 NOTE — Interval H&P Note (Signed)
History and Physical Interval Note:  08/03/2019 11:28 AM  Andrew Chaney  has presented today for surgery, with the diagnosis of BACTEREMIA.  The various methods of treatment have been discussed with the patient and family. After consideration of risks, benefits and other options for treatment, the patient has consented to  Procedure(s): TRANSESOPHAGEAL ECHOCARDIOGRAM (TEE) (N/A) as a surgical intervention.  The patient's history has been reviewed, patient examined, no change in status, stable for surgery.  I have reviewed the patient's chart and labs.  Questions were answered to the patient's satisfaction.     Pixie Casino

## 2019-08-03 NOTE — Progress Notes (Signed)
Patient being transported to endo for TEE.

## 2019-08-03 NOTE — Transfer of Care (Signed)
Immediate Anesthesia Transfer of Care Note  Patient: Andrew Chaney  Procedure(s) Performed: TRANSESOPHAGEAL ECHOCARDIOGRAM (TEE) (N/A )  Patient Location: Endoscopy Unit  Anesthesia Type:MAC  Level of Consciousness: drowsy  Airway & Oxygen Therapy: Patient Spontanous Breathing  Post-op Assessment: Report given to RN and Post -op Vital signs reviewed and stable  Post vital signs: Reviewed and stable  Last Vitals:  Vitals Value Taken Time  BP 103/62 08/03/19 1221  Temp 36.7 C 08/03/19 1221  Pulse    Resp 12 08/03/19 1221  SpO2 100 % 08/03/19 1221    Last Pain:  Vitals:   08/03/19 1221  TempSrc: Temporal  PainSc: 0-No pain      Patients Stated Pain Goal: 0 (60/60/04 5997)  Complications: No complications documented.

## 2019-08-04 ENCOUNTER — Encounter (HOSPITAL_COMMUNITY): Payer: Self-pay | Admitting: Internal Medicine

## 2019-08-04 DIAGNOSIS — I959 Hypotension, unspecified: Secondary | ICD-10-CM

## 2019-08-04 DIAGNOSIS — F319 Bipolar disorder, unspecified: Secondary | ICD-10-CM

## 2019-08-04 DIAGNOSIS — D72819 Decreased white blood cell count, unspecified: Secondary | ICD-10-CM

## 2019-08-04 DIAGNOSIS — B182 Chronic viral hepatitis C: Secondary | ICD-10-CM

## 2019-08-04 LAB — BASIC METABOLIC PANEL
Anion gap: 9 (ref 5–15)
BUN: 8 mg/dL (ref 8–23)
CO2: 22 mmol/L (ref 22–32)
Calcium: 8.8 mg/dL — ABNORMAL LOW (ref 8.9–10.3)
Chloride: 105 mmol/L (ref 98–111)
Creatinine, Ser: 0.57 mg/dL — ABNORMAL LOW (ref 0.61–1.24)
GFR calc Af Amer: >60 mL/min (ref >60)
GFR calc non Af Amer: >60 mL/min (ref >60)
Glucose, Bld: 105 mg/dL — ABNORMAL HIGH (ref 70–99)
Potassium: 3.9 mmol/L (ref 3.5–5.1)
Sodium: 136 mmol/L (ref 135–145)

## 2019-08-04 LAB — CBC
HCT: 37.2 % — ABNORMAL LOW (ref 39.0–52.0)
Hemoglobin: 12.1 g/dL — ABNORMAL LOW (ref 13.0–17.0)
MCH: 32 pg (ref 26.0–34.0)
MCHC: 32.5 g/dL (ref 30.0–36.0)
MCV: 98.4 fL (ref 80.0–100.0)
Platelets: 236 10*3/uL (ref 150–400)
RBC: 3.78 MIL/uL — ABNORMAL LOW (ref 4.22–5.81)
RDW: 13.9 % (ref 11.5–15.5)
WBC: 4 10*3/uL (ref 4.0–10.5)
nRBC: 0 % (ref 0.0–0.2)

## 2019-08-04 LAB — MAGNESIUM: Magnesium: 1.8 mg/dL (ref 1.7–2.4)

## 2019-08-04 LAB — DIFFERENTIAL
Abs Immature Granulocytes: 0.01 10*3/uL (ref 0.00–0.07)
Basophils Absolute: 0 10*3/uL (ref 0.0–0.1)
Basophils Relative: 1 %
Eosinophils Absolute: 0.2 10*3/uL (ref 0.0–0.5)
Eosinophils Relative: 4 %
Immature Granulocytes: 0 %
Lymphocytes Relative: 34 %
Lymphs Abs: 1.4 10*3/uL (ref 0.7–4.0)
Monocytes Absolute: 0.5 10*3/uL (ref 0.1–1.0)
Monocytes Relative: 11 %
Neutro Abs: 2 10*3/uL (ref 1.7–7.7)
Neutrophils Relative %: 50 %

## 2019-08-04 LAB — CULTURE, BLOOD (ROUTINE X 2)
Culture: NO GROWTH
Special Requests: ADEQUATE

## 2019-08-04 LAB — PHENYTOIN LEVEL, TOTAL: Phenytoin Lvl: 5.4 ug/mL — ABNORMAL LOW (ref 10.0–20.0)

## 2019-08-04 MED ORDER — SENNOSIDES-DOCUSATE SODIUM 8.6-50 MG PO TABS: 1.0000 | ORAL_TABLET | Freq: Every evening | ORAL | Status: DC | PRN

## 2019-08-04 MED ORDER — SODIUM CHLORIDE 0.9 % IV SOLN
150.0000 mg | Freq: Three times a day (TID) | INTRAVENOUS | Status: DC
  Administered 2019-08-04 – 2019-08-05 (×3): 150 mg via INTRAVENOUS
  Filled 2019-08-04 (×7): qty 3

## 2019-08-04 NOTE — Plan of Care (Signed)

## 2019-08-04 NOTE — Progress Notes (Signed)
Subjective:   Patient seen on rounds this morning in bed. He states he is ok except that he is hungry and thirsty. Discussed that breakfast would be by soon. He denies chest pain, other pain or shortness of breath. He is not sure what he is going to do or where he will go after discharge.   Objective:  Vital signs in last 24 hours: Vitals:   08/03/19 1600 08/03/19 2020 08/03/19 2122 08/04/19 0500  BP: 101/65 92/66 99/65  (!) 94/57  Pulse: 68 96 100 74  Resp: 12 20 17 14   Temp: 98.4 F (36.9 C) 98.1 F (36.7 C) 98.4 F (36.9 C) 98.2 F (36.8 C)  TempSrc: Axillary Oral Oral Oral  SpO2: 98% 95% 93% 94%  Weight:      Height:       Constitution: NAD, appears stated age Cardio: RRR, no m/r/g, no LE edema  Respiratory: CTA, no w/r/r Abdominal: NTTP, soft, non-distended MSK: moving all extremities Neuro: flat affect, a&ox3 Skin: c/d/i   Assessment/Plan:  Principal Problem:   MSSA bacteremia Active Problems:   Acute encephalopathy   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HCC)   Chronic hepatitis C (HCC)   Seizure (HCC)   Status epilepticus (HCC)   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 Upon arrival in ED, he received Ativan, fosphenytoin, Dilantin, and Keppra. Patient developed hypotension despite IVF and was transferred to ICU care requiring Levophed 7/16. Hypotension subsequently improved and care was transferred back to IMTS.   #Acute Encephalopathy #Status Epilepticus, resolved No seizures since arrival on floor. Will continue anti-epileptics. Patient refused LP from neuro, Paraneoplastic panel was done, which was normal. Also plan to follow-up with repeat MRI Brain. Patient's mental status continues to be normal this morning.  - autoimmune encephalopathy panel ordered, appreciate  neuro recs.  -Continue Dilantin, Keppra -F/u Dilantin level  #MSSA Bacteremia without endocarditis #RUE Cellulitis Patient developed erythema, swelling in RUE at IV insertion point 7/18. CT revealed cellulitis, no DVT per u/s. BCx returned MSSA, empirically covered with Ancef. TTE 7/21 showed mobile structure, appears to be attached to MV chord. TEE today revealed no vegetations. He is homeless and will need good plan for antibiotics treatment prior to discharge.  - will f/u with ID, appreciate recommendations - nafcillin 7/22>7/23, now ancef only since 7/19, continue for two weeks until 8/2 vs. Switching to oritavancin.  -Tylenol PRN for fever -repeat BCx 7/21 NGTD  #Hypotension BP continues to be soft, but stable. Hypotension likely due to Dilantin. -Continue midodrine 10mg  TID  #Bipolar 1 disorder #Psychotic disorder with delusions Psych consulted for episodes of agitation requiring Ativan.Patient's mental status improved today.  -Continue paliperidone -Hydroxyzine 25mg  TID PRN preferred over benzos -Ativan 0.5-1 mg q4 PRN -Outpatient psychiatry upon d/c  #Hepatitis C HCV Ab positive, HCV RNA w/ 11 -Follow-up ID for further hepC management  #Prior thiamine deficiency #Alcohol Use Completed high dose thiamine, cont. Thiamine 100 mg qd  #Hypomagnesemia  - resolved yesterday, repeat labs pending   Leukopenia - smear review  Diet: Thin liquid, dysphagia 3 IVF: n/a VTE PPX: Lovenox 40mg  Code Status: Full Prior to Admission Living Arrangement:Homeless shelter Anticipated Discharge Location:Unknown Barriers to Discharge:Medical Treatment  Dispo: Anticipated discharge pending MSSA treatment.   Andrew Chaney A, DO 08/04/2019, 6:09 AM Pager: [phone] After 5pm on weekdays  and 1pm on weekends: On Call Pager: [phone]

## 2019-08-04 NOTE — Progress Notes (Signed)
  Date: 08/04/2019  Patient name: Andrew Chaney  Medical record number: [id number]  Date of birth: [birth year]   This patient's plan of care was discussed with the house staff. Please see Dr. Aurelio Jew note for complete details. I concur with her findings.   Sid Falcon, MD 08/04/2019, 9:58 AM

## 2019-08-04 NOTE — Progress Notes (Signed)
Brief Neuro Update:  The paraneoplastic panel does not test for NMDA receptor encephalitis. Therefore, I ordered serum autoimmune encephalopathy panel on Andrew Chaney today.  If the patient is discharged before the panels are back, he should have a PCP appointment to follow up on it.   [place] Pager Number [phone]

## 2019-08-04 NOTE — Progress Notes (Signed)
Phenytoin Initial Consult Indication: Status epilepticus  No Known Allergies  Patient Measurements: Height: 6' (182.9 cm) (per [facility] 09/2018) Weight: 83.5 kg (184 lb) IBW/kg (Calculated) : 77.6   Body mass index is 24.95 kg/m.   Vital signs: Temp: 97.7 F (36.5 C) (07/24 1159) Temp Source: Oral (07/24 1159) BP: 101/82 (07/24 1159) Pulse Rate: 87 (07/24 1159)  Labs: Lab Results  Component Value Date/Time   Phenytoin Lvl 5.4 (L) 08/04/2019 1311   Lab Results  Component Value Date   PHENYTOIN 5.4 (L) 08/04/2019   Estimated Creatinine Clearance: 106.4 mL/min (A) (by C-G formula based on SCr of 0.57 mg/dL (L)).   Medications:  Scheduled:  . midodrine  10 mg Oral TID WC  . paliperidone  6 mg Oral Daily  . phenytoin (DILANTIN) IV  100 mg Intravenous Q8H  . thiamine  100 mg Oral Daily    Assessment: 64 y.o male presented to [facility] ED on 7/15 with multiple seizures. Started on dilantin and keppra.  Treated with Keppra in the ED and EEG monitoring  Additonal fosphenytoin load (1250mg  x1 on 7/15) and benzo support (lorazepam x2) used in ED  Current regimen of Keppra 500mg  IV every 12 hours and phenytoin 100mg  IV every 8 hours, begun 7/16 am Phenytoin level post load 18.2 on 7/15 (Goal total phenytoin 10-20)  7/20: Dilantin level 11.7, alb 2.6, corrected 18.87, therapeutic level.   7/24: Dilantin level 5.4, alb 2.6 (from 7/16), corrected 8.7, subtherapeutic level Significant potential drug interactions: N/a SLP evaluation done: Mild aspiration risk   Neurology noted 7/20 noted seizures, status epilepticus-resolved -MRI brain with left frontal area of signal abnormality-likely seizure edema and less likely stroke.   7/24: No missed doses in the last 48 hours  Goals of care:  Total phenytoin level: 10-20 mcg/ml Free phenytoin level: 1-2 mcg/ml  Plan:  Increase Phenytoin to 150 mg IV TID, can change to oral dosing when appropriate per attending MD. Recheck phenytoin level as  clinically indicated   Claudina Lick, PharmD PGY1 Acute Care Pharmacy Resident 08/04/2019 2:22 PM  Please check [email or web address] for unit-specific pharmacy phone numbers.

## 2019-08-05 ENCOUNTER — Other Ambulatory Visit: Payer: Self-pay

## 2019-08-05 DIAGNOSIS — M720 Palmar fascial fibromatosis [Dupuytren]: Secondary | ICD-10-CM

## 2019-08-05 DIAGNOSIS — B1920 Unspecified viral hepatitis C without hepatic coma: Secondary | ICD-10-CM

## 2019-08-05 LAB — BASIC METABOLIC PANEL
Anion gap: 9 (ref 5–15)
BUN: <5 mg/dL — ABNORMAL LOW (ref 8–23)
CO2: 24 mmol/L (ref 22–32)
Calcium: 9.3 mg/dL (ref 8.9–10.3)
Chloride: 105 mmol/L (ref 98–111)
Creatinine, Ser: 0.6 mg/dL — ABNORMAL LOW (ref 0.61–1.24)
GFR calc Af Amer: >60 mL/min (ref >60)
GFR calc non Af Amer: >60 mL/min (ref >60)
Glucose, Bld: 112 mg/dL — ABNORMAL HIGH (ref 70–99)
Potassium: 3.8 mmol/L (ref 3.5–5.1)
Sodium: 138 mmol/L (ref 135–145)

## 2019-08-05 LAB — CBC
HCT: 39.2 % (ref 39.0–52.0)
Hemoglobin: 12.9 g/dL — ABNORMAL LOW (ref 13.0–17.0)
MCH: 32.4 pg (ref 26.0–34.0)
MCHC: 32.9 g/dL (ref 30.0–36.0)
MCV: 98.5 fL (ref 80.0–100.0)
Platelets: 282 10*3/uL (ref 150–400)
RBC: 3.98 MIL/uL — ABNORMAL LOW (ref 4.22–5.81)
RDW: 13.7 % (ref 11.5–15.5)
WBC: 5 10*3/uL (ref 4.0–10.5)
nRBC: 0 % (ref 0.0–0.2)

## 2019-08-05 MED ORDER — LEVETIRACETAM 500 MG PO TABS
500.0000 mg | ORAL_TABLET | Freq: Two times a day (BID) | ORAL | Status: DC
  Administered 2019-08-05 – 2019-08-07 (×4): 500 mg via ORAL
  Filled 2019-08-05 (×5): qty 1

## 2019-08-05 MED ORDER — PHENYTOIN SODIUM EXTENDED 100 MG PO CAPS
300.0000 mg | ORAL_CAPSULE | Freq: Every day | ORAL | Status: DC
  Administered 2019-08-05: 300 mg via ORAL
  Filled 2019-08-05: qty 3

## 2019-08-05 MED ORDER — PHENYTOIN SODIUM EXTENDED 100 MG PO CAPS
200.0000 mg | ORAL_CAPSULE | Freq: Every morning | ORAL | Status: DC
  Administered 2019-08-06: 200 mg via ORAL
  Filled 2019-08-05 (×2): qty 2

## 2019-08-05 MED ORDER — HYDROXYZINE HCL 50 MG/ML IM SOLN
50.0000 mg | Freq: Once | INTRAMUSCULAR | Status: AC
  Administered 2019-08-05: 50 mg via INTRAMUSCULAR
  Filled 2019-08-05: qty 1

## 2019-08-05 MED ORDER — PHENYTOIN 50 MG PO CHEW
200.0000 mg | CHEWABLE_TABLET | Freq: Three times a day (TID) | ORAL | Status: DC
  Filled 2019-08-05 (×2): qty 4

## 2019-08-05 MED ORDER — PHENYTOIN 50 MG PO CHEW
200.0000 mg | CHEWABLE_TABLET | Freq: Three times a day (TID) | ORAL | Status: DC
  Filled 2019-08-05: qty 4

## 2019-08-05 MED ORDER — ORITAVANCIN DIPHOSPHATE 400 MG IV SOLR
1200.0000 mg | Freq: Once | INTRAVENOUS | Status: DC
  Filled 2019-08-05: qty 120

## 2019-08-05 NOTE — Progress Notes (Signed)
Ortho tech unable to place finger splint. MD paged and aware.

## 2019-08-05 NOTE — Progress Notes (Signed)
RN paged MD about patient lack of IV access and frequent PIV reinsertion needs. MD told of IV team recommendation for PICC line if continuing with IV meds. MD also informed of pt HR sustained in 120s and requested for PRN meds to address. Awaiting orders.

## 2019-08-05 NOTE — Progress Notes (Signed)
   Subjective: HD#10   Overnight: No acute events reported  Today, Andrew Chaney states that he wanted an omelette and potatoes.  I made him aware that we are slowly advancing his diet as he tolerates.  He continued to express remorse about not taking care of himself prior to admission to the hospital.  I encouraged him and gave him support.  Otherwise he is doing well.  Objective:  Vital signs in last 24 hours: Vitals:   08/04/19 0500 08/04/19 1159 08/04/19 2112 08/05/19 0505  BP: (!) 94/57 101/82 121/85 (!) 143/79  Pulse: 74 87 87 84  Resp: 14 19 19 22   Temp: 98.2 F (36.8 C) 97.7 F (36.5 C) 97.9 F (36.6 C) 98 F (36.7 C)  TempSrc: Oral Oral Oral Oral  SpO2: 94% 96% 96% 98%  Weight:      Height:       Const: In no apparent distress, lying comfortably in bed, conversational Resp: CTA BL, no wheezes, crackles, rhonchi CV: RRR, no murmurs, gallop, rub Ext: Contraction of the right thumb, fourth and fifth digits   Assessment/Plan:  Principal Problem:   MSSA bacteremia Active Problems:   Acute encephalopathy   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HCC)   Chronic hepatitis C (HCC)   Seizure (HCC)   Status epilepticus (HCC)   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 Upon arrival in ED, he received Ativan, fosphenytoin, Dilantin, and Keppra. Patient developed hypotension despite IVF and was transferred to ICU care requiring Levophed 7/16. Hypotension subsequently improved and care was transferred back to IMTS.   #Acute Encephalopathy #Status Epilepticus, resolved No seizures since arrival on floor.  -Autoimmune encephalopathy panel ordered, appreciate neuro recs.  -Continue Dilantin, Keppra -F/u Dilantin level  #MSSA Bacteremiawithout endocarditis #RUE Cellulitis Patient  developed erythema, swelling in RUE at IV insertion point 7/18. CT revealed cellulitis, no DVT per u/s. BCx returned MSSA, empirically covered with Ancef. TTE 7/21 showed mobile structure, appears to be attached to MV chord.TEErevealed no vegetations. He is homeless and will need good plan for antibiotics treatment prior to discharge.  -will f/u with ID, appreciate recommendations -nafcillin 7/22>7/23, now ancef only since 7/19, continue for two weeks until 8/2 vs. Switching to oritavancin.  -Tylenol PRN for fever -repeat BCx 7/21 NGTD  #Hypotension-improved BP continues to be soft, but stable. Hypotension likely due to Dilantin. -Continue midodrine 10mg  TID  #Bipolar 1 disorder #Psychotic disorder with delusions Most pleasant for the past couple days. -Continue paliperidone -Hydroxyzine 25mg  TID PRNpreferred over benzos -Ativan 0.5-1 mg q4 PRN -Outpatient psychiatry upon d/c  #Hepatitis C HCV Ab positive,HCV RNA w/ 11 -Follow-up ID for further hepC management  #Prior thiamine deficiency #Alcohol Use -Completed high dose thiamine, cont. Thiamine 100 mg qd  #Hypomagnesemia - resolved yesterday, repeat labs pending   #Leukopenia -Smear review  #Dupuytren's contracture of R. Thumb, 4th and 5th digit -Requires outpatient injection versus surgery -Will order finger splint    Diet: Thin liquid, dysphagia 3 IVF: n/a VTE PPX: Lovenox 40mg  Code Status: Full Prior to Admission Living Arrangement:Homeless shelter Anticipated Discharge Location:Unknown Barriers to Discharge:Medical Treatment   Jean Rosenthal, MD 08/05/2019, 6:08 AM Pager: [phone] Internal Medicine Teaching Service After 5pm on weekdays and 1pm on weekends: On Call pager: [phone]

## 2019-08-05 NOTE — Progress Notes (Signed)
Requested by RN to place PIV.  Noted 10 PIV's in as many days and RUE cellulitis.  Encourage consideration of changing meds to po vs a PICC line.  RN to call MD.

## 2019-08-05 NOTE — Progress Notes (Signed)
Orthopedic Tech Progress Note Patient Details:  Andrew Chaney 1955-02-19 [id number] Attempted to place finger splint on hand but was unable to stretch out the patients fingers. Told RN Patient ID: [name], male   DOB: [birth year], 64 y.o.   MRN: [id number]   Chip Boer 08/05/2019, 1:32 PM

## 2019-08-06 ENCOUNTER — Inpatient Hospital Stay (HOSPITAL_COMMUNITY): Payer: Self-pay

## 2019-08-06 LAB — CBC
HCT: 36.8 % — ABNORMAL LOW (ref 39.0–52.0)
Hemoglobin: 11.8 g/dL — ABNORMAL LOW (ref 13.0–17.0)
MCH: 31.9 pg (ref 26.0–34.0)
MCHC: 32.1 g/dL (ref 30.0–36.0)
MCV: 99.5 fL (ref 80.0–100.0)
Platelets: 298 10*3/uL (ref 150–400)
RBC: 3.7 MIL/uL — ABNORMAL LOW (ref 4.22–5.81)
RDW: 13.9 % (ref 11.5–15.5)
WBC: 4.9 10*3/uL (ref 4.0–10.5)
nRBC: 0 % (ref 0.0–0.2)

## 2019-08-06 LAB — BASIC METABOLIC PANEL
Anion gap: 8 (ref 5–15)
BUN: 5 mg/dL — ABNORMAL LOW (ref 8–23)
CO2: 26 mmol/L (ref 22–32)
Calcium: 9.1 mg/dL (ref 8.9–10.3)
Chloride: 107 mmol/L (ref 98–111)
Creatinine, Ser: 0.63 mg/dL (ref 0.61–1.24)
GFR calc Af Amer: >60 mL/min (ref >60)
GFR calc non Af Amer: >60 mL/min (ref >60)
Glucose, Bld: 99 mg/dL (ref 70–99)
Potassium: 4 mmol/L (ref 3.5–5.1)
Sodium: 141 mmol/L (ref 135–145)

## 2019-08-06 LAB — MISC LABCORP TEST (SEND OUT)
Labcorp test code: 505265
Labcorp test code: 505265

## 2019-08-06 LAB — CULTURE, BLOOD (ROUTINE X 2)
Culture: NO GROWTH
Culture: NO GROWTH
Special Requests: ADEQUATE

## 2019-08-06 LAB — MAGNESIUM: Magnesium: 1.8 mg/dL (ref 1.7–2.4)

## 2019-08-06 LAB — PHENYTOIN LEVEL, TOTAL: Phenytoin Lvl: 3.3 ug/mL — ABNORMAL LOW (ref 10.0–20.0)

## 2019-08-06 LAB — PATHOLOGIST SMEAR REVIEW

## 2019-08-06 MED ORDER — RAMELTEON 8 MG PO TABS
8.0000 mg | ORAL_TABLET | Freq: Every day | ORAL | Status: DC
  Administered 2019-08-06: 8 mg via ORAL
  Filled 2019-08-06 (×2): qty 1

## 2019-08-06 MED ORDER — LINEZOLID 600 MG PO TABS
600.0000 mg | ORAL_TABLET | Freq: Two times a day (BID) | ORAL | Status: DC
  Administered 2019-08-06: 600 mg via ORAL
  Filled 2019-08-06: qty 1

## 2019-08-06 MED ORDER — ORITAVANCIN DIPHOSPHATE 400 MG IV SOLR
1200.0000 mg | Freq: Once | INTRAVENOUS | Status: AC
  Administered 2019-08-06: 1200 mg via INTRAVENOUS
  Filled 2019-08-06: qty 120

## 2019-08-06 MED ORDER — SODIUM CHLORIDE 0.9 % IV SOLN
500.0000 mg | Freq: Once | INTRAVENOUS | Status: AC
  Administered 2019-08-06: 500 mg via INTRAVENOUS
  Filled 2019-08-06: qty 10

## 2019-08-06 MED ORDER — POLYETHYLENE GLYCOL 3350 17 G PO PACK
17.0000 g | PACK | Freq: Every day | ORAL | Status: DC
  Administered 2019-08-06 – 2019-08-07 (×2): 17 g via ORAL
  Filled 2019-08-06 (×2): qty 1

## 2019-08-06 MED ORDER — PHENYTOIN SODIUM EXTENDED 100 MG PO CAPS
200.0000 mg | ORAL_CAPSULE | Freq: Two times a day (BID) | ORAL | Status: DC
  Administered 2019-08-06 – 2019-08-07 (×2): 200 mg via ORAL
  Filled 2019-08-06 (×2): qty 2

## 2019-08-06 MED ORDER — GADOBUTROL 1 MMOL/ML IV SOLN
7.5000 mL | Freq: Once | INTRAVENOUS | Status: AC | PRN
  Administered 2019-08-06: 7.5 mL via INTRAVENOUS

## 2019-08-06 NOTE — Progress Notes (Signed)
Pt has been on Dilantin for his sz. Several of his levels have been subtherapeutic and trending down. The most recent level came back at 3.3>>corrected for alb = 5.3. The dose was increased but it may be too much due to the kinetics of phenytoin. We will give a small bolus and increase the maintenance dose but not as much as the current dose.   Dilantin 500mg  IV x1 then change the maintenance dose to 200mg  PO BID Level in a few days   Onnie Boer, PharmD, [place], AAHIVP, CPP Infectious Disease Pharmacist 08/06/2019 3:15 PM

## 2019-08-06 NOTE — Progress Notes (Signed)
Patient is sleeping  

## 2019-08-06 NOTE — TOC Initial Note (Signed)
Transition of Care [facility]) - Initial/Assessment Note    Patient Details  Name: Andrew Chaney MRN: [id number] Date of Birth: 10-13-1955  Transition [facility] Care [facility]) CM/SW Contact:    Benard Halsted, LCSW Phone Number: 08/06/2019, 12:39 PM  Clinical Narrative:                 CSW received consult regarding homelessness. Patient reported he has been homeless for several years. He declined resources when offered. CSW offered cab voucher for transportation at discharge, however he declined due to not knowing where he will end up. CSW also offered a bus pass, which he also declined. He stated he is not allowed at the [facility] but would not elaborate. He reported agreement with CSW scheduling a hospital follow up appointment. Patient to follow up at [facility] for mental health support. RNCM to assist with obtaining patient's medications at discharge through [place]. Patient declined other needs at this time.   Expected Discharge Plan: Home/Self Care Barriers to Discharge: Continued Medical Work up   Patient Goals and CMS Choice Patient states their goals for this hospitalization and ongoing recovery are:: Feel better      Expected Discharge Plan and Services Expected Discharge Plan: Home/Self Care In-house Referral: Clinical Social Work Discharge Planning Services: Follow-up appt scheduled, [facility], Medication Assistance Post Acute Care Choice: NA Living arrangements for the past 2 months: No permanent address, Homeless Shelter                                      Prior Living Arrangements/Services Living arrangements for the past 2 months: No permanent address, Homeless Shelter Lives with:: Self Patient language and need for interpreter reviewed:: Yes Do you feel safe going back to the place where you live?: Yes      Need for Family Participation in Patient Care: No (Comment) Care giver support system in place?: No (comment)   Criminal Activity/Legal Involvement Pertinent  to Current Situation/Hospitalization: No - Comment as needed  Activities of Daily Living      Permission Sought/Granted Permission sought to share information with : Facility Art therapist granted to share information with : Yes, Verbal Permission Granted              Emotional Assessment Appearance:: Appears stated age, Disheveled Attitude/Demeanor/Rapport: Guarded Affect (typically observed): Quiet, Defensive Orientation: : Oriented to Self, Oriented to Place, Oriented to Situation Alcohol / Substance Use: Not Applicable Psych Involvement: No (comment)  Admission diagnosis:  Status epilepticus ([place]) [G40.901] Seizure ([place]) [R56.9] Respiration abnormal [R06.9] Altered mental status, unspecified altered mental status type [R41.82] Patient Active Problem List   Diagnosis Date Noted  . Dupuytren's contracture 08/05/2019  . MSSA bacteremia 07/31/2019  . Altered mental status   . Fever 07/30/2019  . Non-purulent Cellulitis of right upper arm 07/30/2019  . 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 Seizure ([place]) 07/26/2019  . Status epilepticus ([place]) 07/26/2019  . Psychosis ([place]) 09/05/2018  . Bipolar disorder, most recent episode manic ([place]) 04/11/2018  . Bipolar I disorder, current or most recent episode manic, with psychotic features ([place]) 04/08/2018  . Confusion 01/03/2015  . Chronic hepatitis C ([place]) 12/20/2012  . Renal failure 12/17/2012  . Acute encephalopathy 12/17/2012  . ARF (acute renal failure) ([place]) 12/17/2012  . Hyponatremia 12/17/2012  . CAP (community acquired pneumonia) 12/17/2012  . Rhabdomyolysis 12/17/2012  . 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

## 2019-08-06 NOTE — Evaluation (Signed)
Clinical/Bedside Swallow Evaluation Patient Details  Name: Andrew Chaney MRN: [id number] Date of Birth: June 22, 1955  Today's Date: 08/06/2019 Time: SLP Start Time (ACUTE ONLY): 2952 SLP Stop Time (ACUTE ONLY): 1746 SLP Time Calculation (min) (ACUTE ONLY): 14 min  Past Medical History:  Past Medical History:  Diagnosis Date  . Bipolar 1 disorder ([place])   . Cancer [facility]) 2011   Germ Cell [place]   . Renal disorder    Past Surgical History:  Past Surgical History:  Procedure Laterality Date  . TEE WITHOUT CARDIOVERSION N/A 08/03/2019   Procedure: TRANSESOPHAGEAL ECHOCARDIOGRAM (TEE);  Surgeon: Pixie Casino, MD;  Location: [facility] ENDOSCOPY;  Service: Cardiovascular;  Laterality: N/A;   HPI:  Andrew Chaney is a 64 year old male with a past medical history significant for bipolar 1 disease, germ cell seroma, homelessness, and medical renal disease with witnessed seizure activity at homeless shelter where he resides per MD notes.  Per chart EMS witnessed 2 seizures on arrival. Patient received Ativan by EMS.  Urine drug screen positive for benzodiazepines and THC on admission per notes.  Per neurology note, patient was seen in status epilepticus for which he received fosphenytoin, Dilantin, Keppra, and as needed pushes of Ativan. Initial swallow eval with recs for dysphagia 3. New orders today to determine if pt is safe to advance to regular solids..   Assessment / Plan / Recommendation Clinical Impression  Pt presents with normal swallow function with adequate mastication, brisk swallow response, and no s/s of aspiration.  He is safe for a regular consistency diet and thin liquids.  Meds may be given in water.  No SLP f/u needed.   SLP Visit Diagnosis: Dysphagia, unspecified (R13.10)    Aspiration Risk  No limitations    Diet Recommendation   regular, thin liquids  Medication Administration: Whole meds with liquid    Other  Recommendations Oral Care Recommendations: Oral care BID    Follow up Recommendations    none    Frequency and Duration            Prognosis        Swallow Study   General Date of Onset: 07/28/19 HPI: Andrew Chaney is a 64 year old male with a past medical history significant for bipolar 1 disease, germ cell seroma, homelessness, and medical renal disease with witnessed seizure activity at homeless shelter where he resides per MD notes.  Per chart EMS witnessed 2 seizures on arrival. Patient received Ativan by EMS.  Urine drug screen positive for benzodiazepines and THC on admission per notes.  Per neurology note, patient was seen in status epilepticus for which he received fosphenytoin, Dilantin, Keppra, and as needed pushes of Ativan. Swallow eval was ordered as pt failed [facility] screen.  . Type of Study: Bedside Swallow Evaluation Previous Swallow Assessment: 7/20 Diet Prior to this Study: Dysphagia 3 (soft);Thin liquids Temperature Spikes Noted: No Respiratory Status: Room air History of Recent Intubation: No Behavior/Cognition: Alert;Cooperative;Other (Comment);Confused;Distractible;Requires cueing Oral Cavity Assessment: Within Functional Limits Oral Care Completed by SLP: No Oral Cavity - Dentition: Missing dentition Vision: Functional for self-feeding Self-Feeding Abilities: Able to feed self Patient Positioning: Upright in bed Baseline Vocal Quality: Normal Volitional Cough: Strong Volitional Swallow: Able to elicit    Oral/Motor/Sensory Function Overall Oral Motor/Sensory Function: Within functional limits   Ice Chips Ice chips: Within functional limits   Thin Liquid Thin Liquid: Within functional limits    Nectar Thick Nectar Thick Liquid: Not tested   Honey Thick Honey Thick Liquid: Not tested  Puree Puree: Within functional limits   Solid     Solid: Within functional limits      Andrew Chaney 08/06/2019,5:47 PM   Estill Bamberg L. Tivis Ringer, [place] Office number  [phone] Pager [phone]

## 2019-08-06 NOTE — Progress Notes (Signed)
Patient off the floor for MRI.

## 2019-08-06 NOTE — Plan of Care (Signed)

## 2019-08-06 NOTE — Plan of Care (Signed)

## 2019-08-06 NOTE — Progress Notes (Signed)
Patient is back from MRI  

## 2019-08-06 NOTE — Progress Notes (Signed)
Subjective: No acute events overnight. Yesterday was having issues with IV access, PICC or midline suggested. The only IV medication he still needs is infusion of oritavancin today. Will attempt PIV for this, then try midline if unsuccessful.  Patient is seen at bedside today. He appears in no acute distress. Patient has racing speech. He complaints of poor sleep and poor diet. He also states that he is hungry and dehydrated. Patient denies chest pain, SOB, or N/V. He said he had a loose BM yesterday.  Objective:  Vital signs in last 24 hours: Vitals:   08/05/19 0505 08/05/19 1510 08/06/19 0424 08/06/19 0532  BP: (!) 143/79 113/73 (!) 96/56 (!) 103/53  Pulse: 84 102 89 78  Resp: 22 15 19 13   Temp: 98 F (36.7 C) 98.3 F (36.8 C) 98.8 F (37.1 C)   TempSrc: Oral Oral Oral   SpO2: 98% 91% 90% 94%  Weight:      Height:        Physical Exam Constitutional: no acute distress Head: atraumatic ENT: external ears normal Eyes: EOMI Cardiovascular: regular rate and rhythm, normal heart sounds Pulmonary: effort normal, lungs clear to ascultation bilaterally Abdominal: flat, nontender, no rebound tenderness, bowel sounds normal Musculoskeletal: contraction of R thumb, 4th, and 5th digits Skin: warm and dry Neurological: alert, no focal deficit Psychiatric: anxious and speaking quickly, but cohesive  Assessment/Plan: Andrew Chaney is a 64 y.o. male with hx of bipolar 1, homelessness presenting in status epilepticus, now resolved after ativan, fosphenytoin, dilantin, and keppra. Developed hypotension despite IVF and transferred to ICU requiring Levophed on 7/16, BP improved and transferred back to IMTS. Also found to have MSSA bacteremia 2/2 RUE cellulitis.   Principal Problem:   MSSA bacteremia Active Problems:   Acute encephalopathy   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H[facility])   Chronic hepatitis C ([facility])   Seizure ([place])   Status epilepticus ([place])   Cannabis abuse with psychotic  disorder with delusions ([place])   Fever   Non-purulent Cellulitis of right upper arm   Altered mental status   Dupuytren's contracture  #Acute Encephalopathy #Status Epilepticus, resolved No seizures since arrival on floor. Initially treated with ativan, fosphenytoin, dilantin, and keppra. Now dilantin and Keppra only. -appreciate neurology recs -f/u paraneoplastic encephalopathy panel and NMDA receptor  -Continue Dilantin, Keppra -phenytoin level 3.3, f/u with pharmacy to adjust dose -repeat MRI brain today to monitor frontal T2/FLAIR hyperintensity -f/u with neuro outpatient  #MSSA Bacteremiawithout endocarditis #RUE Cellulitis Patient developed erythema, swelling in RUE at IV insertion point 7/18. CT revealed cellulitis, no DVT per u/s. BCx returned MSSA, empirically covered with Ancef. TTE 7/21 showed mobile structure, appears to be attached to MV chord.TEErevealed no vegetations. Repeat blood culture on 7/21 with no growth to date. Ancef since 7/19, nafcillin on 7/22-23. -ID following, appreciate recs -infusion of oritavancin today, stop ancef, no further Abx needed per ID -Tylenol PRN for fever  #Hypotension-improved BP continues to be soft, butstable. Hypotension likely due to Dilantin. -Continue midodrine 10mg  TID  #Bipolar 1 disorder #Psychotic disorder with delusions Previously had active psychosis, now improved but still somewhat agitated. -Continuepaliperidone -Hydroxyzine 25mg  TID PRNpreferred over benzos -Ativan 0.5-1 mg q4 PRN -Outpatient psychiatry upon d/c  #Hepatitis C HCV Ab positive,HCV RNA w/ 11 -Follow-up ID for further hepC management  #Prior thiamine deficiency #Alcohol Use -Completed high dose thiamine, cont. Thiamine 100 mg qd  #Leukopenia -f/u Smear review  #Dupuytren's contracture of R. Thumb, 4th and 5th digit Fingersplint attempted, but was unable  to stretch fingers enough. -outpatient injection versus surgery  Diet:  dysphagia 3, SLP consulted for advancement IVF: none VTE: Lovenox Prior to Admission Living Arrangement: homeless shelter Anticipated Discharge Location: unclear Barriers to Discharge: bacteremia, placement Dispo: Anticipated discharge in approximately 1-2 day(s).   Andrew Au, MD 08/06/2019, 6:36 AM Pager: [phone] After 5pm on weekdays and 1pm on weekends: On Call pager [phone]

## 2019-08-06 NOTE — Progress Notes (Signed)
Patient sleeping in no acute distress.  

## 2019-08-06 NOTE — Plan of Care (Signed)
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
## 2019-08-06 NOTE — Progress Notes (Signed)
[facility]  Date of Admission:  07/26/2019     Total days of antibiotics 8         ASSESSMENT:  Mr. Jeancharles blood cultures 7/21 have finalized without growth indicating clearance of bacteremia which likely related to thrombophlebitis of IV site. Has remained afebrile and clinically stable. Now has PIV access. Will give 1 dose of long active ortivancin which will cover him for uncomplicated MSSA bacteremia given source control with removal of the IV and no endocarditis on TEE. No additional antibiotic therapy will be needed beyond the dose of oritivancin today.   PLAN:  1. One dose of long acting oritivancin per pharmacy today. 2. No additional antibiotic therapy necessary. 3. Recommend follow up outpatient for Hepatitis C.   ID will sign off and be available as needed.   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 levETIRAcetam  500 mg Oral BID  . midodrine  10 mg Oral TID WC  . paliperidone  6 mg Oral Daily  . phenytoin  200 mg Oral q morning - 10a  . phenytoin  300 mg Oral QHS  . polyethylene glycol  17 g Oral Daily  . ramelteon  8 mg Oral QHS  . thiamine  100 mg Oral Daily    SUBJECTIVE:  Afebrile overnight with no acute events. Having trouble with IV access which is now in the left hand. Doing okay.  No Known Allergies   Review of Systems: Review of Systems  Constitutional: Negative for chills, fever and weight loss.  Respiratory: Negative for cough, shortness of breath and wheezing.   Cardiovascular: Negative for chest pain and leg swelling.  Gastrointestinal: Negative for abdominal pain, constipation, diarrhea,  nausea and vomiting.  Skin: Negative for rash.      OBJECTIVE: Vitals:   08/05/19 0505 08/05/19 1510 08/06/19 0424 08/06/19 0532  BP: (!) 143/79 113/73 (!) 96/56 (!) 103/53  Pulse: 84 102 89 78  Resp: 22 15 19 13   Temp: 98 F (36.7 C) 98.3 F (36.8 C) 98.8 F (37.1 C)   TempSrc: Oral Oral Oral   SpO2: 98% 91% 90% 94%  Weight:      Height:       Body mass index is 24.95 kg/m.  Physical Exam Constitutional:      General: He is not in acute distress.    Appearance: He is well-developed.  Cardiovascular:     Rate and Rhythm: Normal rate and regular rhythm.     Heart sounds: Normal heart sounds.  Pulmonary:     Effort: Pulmonary effort is normal.     Breath sounds: Normal breath sounds.  Skin:    General: Skin is warm and dry.  Neurological:     Mental Status: He is alert and oriented to person, place, and time.  Psychiatric:        Behavior: Behavior normal.        Thought Content: Thought content normal.        Judgment: Judgment normal.     Lab Results Lab Results  Component Value Date   WBC 4.9 08/06/2019   HGB 11.8 (L) 08/06/2019  HCT 36.8 (L) 08/06/2019   MCV 99.5 08/06/2019   PLT 298 08/06/2019    Lab Results  Component Value Date   CREATININE 0.63 08/06/2019   BUN 5 (L) 08/06/2019   NA 141 08/06/2019   K 4.0 08/06/2019   CL 107 08/06/2019   CO2 26 08/06/2019    Lab Results  Component Value Date   ALT 19 07/27/2019   AST 53 (H) 07/27/2019   ALKPHOS 28 (L) 07/27/2019   BILITOT 1.8 (H) 07/27/2019     Microbiology: Recent Results (from the past 240 hour(s))  Culture, Urine     Status: None   Collection Time: 07/27/19 12:52 PM   Specimen: Urine, Random  Result Value Ref Range Status   Specimen Description URINE, RANDOM  Final   Special Requests NONE  Final   Culture   Final    NO GROWTH Performed at [facility], [address] [place], [place] [address]    Report Status 07/28/2019 FINAL  Final  MRSA PCR Screening     Status:  None   Collection Time: 07/27/19  8:28 PM   Specimen: Nasal Mucosa; Nasopharyngeal  Result Value Ref Range Status   MRSA by PCR NEGATIVE NEGATIVE Final    Comment:        The GeneXpert MRSA Assay (FDA approved for NASAL specimens only), is one component of a comprehensive MRSA colonization surveillance program. It is not intended to diagnose MRSA infection nor to guide or monitor treatment for MRSA infections. Performed at [facility], [place] [address], [place], [place] [address]   Culture, blood (routine x 2)     Status: Abnormal   Collection Time: 07/30/19  2:05 PM   Specimen: BLOOD LEFT HAND  Result Value Ref Range Status   Specimen Description BLOOD LEFT HAND  Final   Special Requests   Final    BOTTLES DRAWN AEROBIC ONLY Blood Culture adequate volume   Culture  Setup Time   Final    GRAM POSITIVE COCCI IN CLUSTERS AEROBIC BOTTLE ONLY CRITICAL RESULT CALLED TO, READ BACK BY AND VERIFIED WITH: Park Breed [id number] FCP Performed at [facility], [place] [address], [place], [place] [address]    Culture STAPHYLOCOCCUS AUREUS (A)  Final   Report Status 08/02/2019 FINAL  Final   Organism ID, Bacteria STAPHYLOCOCCUS AUREUS  Final      Susceptibility   Staphylococcus aureus - MIC*    CIPROFLOXACIN <=0.5 SENSITIVE Sensitive     ERYTHROMYCIN <=0.25 SENSITIVE Sensitive     GENTAMICIN <=0.5 SENSITIVE Sensitive     OXACILLIN 0.5 SENSITIVE Sensitive     TETRACYCLINE <=1 SENSITIVE Sensitive     VANCOMYCIN <=0.5 SENSITIVE Sensitive     TRIMETH/SULFA <=10 SENSITIVE Sensitive     CLINDAMYCIN <=0.25 SENSITIVE Sensitive     RIFAMPIN <=0.5 SENSITIVE Sensitive     Inducible Clindamycin NEGATIVE Sensitive     * STAPHYLOCOCCUS AUREUS  Culture, blood (routine x 2)     Status: None   Collection Time: 07/30/19  2:05 PM   Specimen: BLOOD LEFT HAND  Result Value Ref Range Status   Specimen Description BLOOD LEFT HAND  Final   Special Requests   Final    BOTTLES DRAWN  AEROBIC ONLY Blood Culture adequate volume   Culture   Final    NO GROWTH 5 DAYS Performed at [facility], [place] [address] [place], [place] [address]    Report Status 08/04/2019 FINAL  Final  Blood Culture ID Panel (Reflexed)     Status: Abnormal   Collection Time: 07/30/19  2:05 PM  Result Value Ref Range Status   Enterococcus species NOT DETECTED NOT DETECTED Final   Listeria monocytogenes NOT DETECTED NOT DETECTED Final   Staphylococcus species DETECTED (A) NOT DETECTED Final    Comment: CRITICAL RESULT CALLED TO, READ BACK BY AND VERIFIED WITH: PHARMD JEREMY F. [phone] FCP    Staphylococcus aureus (BCID) DETECTED (A) NOT DETECTED Final    Comment: Methicillin (oxacillin) susceptible Staphylococcus aureus (MSSA). Preferred therapy is anti staphylococcal beta lactam antibiotic (Cefazolin or Nafcillin), unless clinically contraindicated. CRITICAL RESULT CALLED TO, READ BACK BY AND VERIFIED WITH: PHARMD JEREMY F. [phone] FCP    Methicillin resistance NOT DETECTED NOT DETECTED Final   Streptococcus species NOT DETECTED NOT DETECTED Final   Streptococcus agalactiae NOT DETECTED NOT DETECTED Final   Streptococcus pneumoniae NOT DETECTED NOT DETECTED Final   Streptococcus pyogenes NOT DETECTED NOT DETECTED Final   Acinetobacter baumannii NOT DETECTED NOT DETECTED Final   Enterobacteriaceae species NOT DETECTED NOT DETECTED Final   Enterobacter cloacae complex NOT DETECTED NOT DETECTED Final   Escherichia coli NOT DETECTED NOT DETECTED Final   Klebsiella oxytoca NOT DETECTED NOT DETECTED Final   Klebsiella pneumoniae NOT DETECTED NOT DETECTED Final   Proteus species NOT DETECTED NOT DETECTED Final   Serratia marcescens NOT DETECTED NOT DETECTED Final   Haemophilus influenzae NOT DETECTED NOT DETECTED Final   Neisseria meningitidis NOT DETECTED NOT DETECTED Final   Pseudomonas aeruginosa NOT DETECTED NOT DETECTED Final   Candida albicans NOT DETECTED NOT DETECTED Final    Candida glabrata NOT DETECTED NOT DETECTED Final   Candida krusei NOT DETECTED NOT DETECTED Final   Candida parapsilosis NOT DETECTED NOT DETECTED Final   Candida tropicalis NOT DETECTED NOT DETECTED Final    Comment: Performed at [facility], [place] [address], [place], [place] [address]  Culture, blood (routine x 2)     Status: None   Collection Time: 08/01/19  8:33 AM   Specimen: BLOOD RIGHT HAND  Result Value Ref Range Status   Specimen Description BLOOD RIGHT HAND  Final   Special Requests   Final    BOTTLES DRAWN AEROBIC ONLY Blood Culture adequate volume   Culture   Final    NO GROWTH 5 DAYS Performed at [facility], [address] [place], [place] [address]    Report Status 08/06/2019 FINAL  Final  Culture, blood (routine x 2)     Status: None   Collection Time: 08/01/19  8:33 AM   Specimen: BLOOD RIGHT HAND  Result Value Ref Range Status   Specimen Description BLOOD RIGHT HAND  Final   Special Requests   Final    BOTTLES DRAWN AEROBIC ONLY Blood Culture results may not be optimal due to an inadequate volume of blood received in culture bottles   Culture   Final    NO GROWTH 5 DAYS Performed at [facility], [place] [address] [place], [place] [address]    Report Status 08/06/2019 FINAL  Final     Terri Piedra, NP [facility]  08/06/2019  11:15 AM

## 2019-08-06 NOTE — Progress Notes (Signed)
Patient IV line has been pulled out by the patient, he has no IV access at this time.  Requested via chat for PO medications for his dilantin

## 2019-08-07 LAB — PHENYTOIN LEVEL, FREE AND TOTAL
Phenytoin, Free: NOT DETECTED ug/mL (ref 1.0–2.0)
Phenytoin, Total: 3.3 ug/mL — ABNORMAL LOW (ref 10.0–20.0)

## 2019-08-07 MED ORDER — PHENYTOIN SODIUM EXTENDED 200 MG PO CAPS: 200.0000 mg | ORAL_CAPSULE | Freq: Two times a day (BID) | ORAL | 0 refills | Status: DC

## 2019-08-07 MED ORDER — THIAMINE HCL 100 MG PO TABS: 100.0000 mg | ORAL_TABLET | Freq: Every day | ORAL | 0 refills | Status: AC

## 2019-08-07 MED ORDER — HYDROXYZINE HCL 50 MG PO TABS: 50.0000 mg | ORAL_TABLET | Freq: Three times a day (TID) | ORAL | 0 refills | Status: AC | PRN

## 2019-08-07 MED ORDER — RAMELTEON 8 MG PO TABS: 8.0000 mg | ORAL_TABLET | Freq: Every day | ORAL | 0 refills | Status: DC

## 2019-08-07 MED ORDER — PHENYTOIN SODIUM EXTENDED 100 MG PO CAPS
200.0000 mg | ORAL_CAPSULE | Freq: Two times a day (BID) | ORAL | Status: DC
  Filled 2019-08-07: qty 2

## 2019-08-07 MED ORDER — PALIPERIDONE ER 6 MG PO TB24: 6.0000 mg | ORAL_TABLET | Freq: Every day | ORAL | 0 refills | Status: DC

## 2019-08-07 MED ORDER — LEVETIRACETAM 500 MG PO TABS: 500.0000 mg | ORAL_TABLET | Freq: Two times a day (BID) | ORAL | 0 refills | Status: DC

## 2019-08-07 MED ORDER — MIDODRINE HCL 10 MG PO TABS: 10.0000 mg | ORAL_TABLET | Freq: Three times a day (TID) | ORAL | 0 refills | Status: AC

## 2019-08-07 MED FILL — VITAMIN B-1 100 MG TABS: 100 | 30 days supply | Qty: 30 | Fill #0

## 2019-08-07 MED FILL — PALIPERIDONE ER 6 MG TB24: 6 | 30 days supply | Qty: 30 | Fill #0

## 2019-08-07 MED FILL — MIDODRINE HCL 10 MG TABLET: 10 | 30 days supply | Qty: 90 | Fill #0

## 2019-08-07 MED FILL — PHENYTOIN SOD EXT 100 MG CA: 100 | 30 days supply | Qty: 120 | Fill #0

## 2019-08-07 MED FILL — levETIRAcetam 500 MG TABS: 500 | 30 days supply | Qty: 60 | Fill #0

## 2019-08-07 NOTE — Hospital Course (Signed)
Status epilepticus, resolved Patient presented with altered mental status via EMS on 7/15 from homeless shelter, with seizures reported at shelter and during EMS transport. EEG here with continuous R-sided seizures. Treated with Ativan and loaded by neurology with Keppra and fosphenytoin, afterwards maintained on Keppra and phenytoin. Seizures aborted, and no recent seizures. Was admitted to the ICU that night for low blood pressure requiring Levophed, possibly 2/2 phenytoin requirement. MRI with abnormal signal in the L medial frontal lobe. This was nearly resolved upon repeat MRI 1 week later, and thought likely due to prior seizure activity. No evidence of infection, and patient declined LP. Transferred out of ICU on 7/17 by which time he was no longer encephalopathic, and has not had further seizures. Discharged on Dilantin and Keppra. Paraneoplastic panel negative. Of note, his Dilantin level was found to be low for a few days, most recently 3.3 on 7/26. Appreciate pharmacy help with dosing. Loading dose administered on 7/26, and changed PO dose to 200mg  BID. On follow up, recheck dilantin level and adjust dose as needed. Pharmacy stated that he would ultimately likely require around 400mg  BID. Recheck dilantin level in 4 days on 7/30.   MSSA bacteremia secondary to RUE thrombophlebitis Erythema and swelling noted at IV insertion site on 7/18. CT demonstrated cellulitis, no DVT per [place]. Blood culture with MSSA. TTE initially revealed small mobile structure on MV poorly visualized, TEE demonstrated no vegetations. Covered on Ancef from 7/19-7/26, had 2 days of nafcillin. Patient had persistent issues with IV access. On 7/26 had oritavancin infusion, which concluded his Abx course. Repeat blood culture from 7/21 without growth.   Hypotension History of BP frequently in 54U-981X systolic, but other times has had BP in normal range. Presented with somewhat soft BP which became worse during night of admission  not responding to IV fluids, admitted to ICU briefly for levophed requirement. Thought possibly secondary to Dilantin requirement. Now on Midodrine 10mg  TID, and recent blood pressures remain soft 90s/50s. Note that he may require further increase in Dilantin dose on outpatient basis to reach therapeutic levels. Assess BP on follow up and consider midodrine dose.  Bipolar 1 disorder, psychosis with delusions History of bipolar disorder and polysubstance abuse. Had episodes of agitation including smearing self with excrement. Denies SI or HI. Evaluated by [facility], who recommended outpatient follow up and helped guide drug choice. Started on [place], and treated preferentially with hydroxyzine over ativan for episodes of agitation. Is calm and coherent at time of discharge. Will continue him on [place]. Follow up with [facility] outpatient.   Dupuytren's contracture of R. Thumb, 4th and 5th digit Fingersplint attempted, but was unable to stretch fingers enough. Chronic and stable problem. Follow up outpatient for injection versus surgery.

## 2019-08-07 NOTE — Progress Notes (Addendum)
S: Patient has no complaints at this time.  He does state that [place] bus station and riding [place] bus.  This will create a problem for him to obtain his medications at the free clinic.  He states he is unable to walk that distance.  O: BP (!) 91/59 (BP Location: Right Arm)   Pulse 72   Temp 98.5 F (36.9 C) (Oral)   Resp 14   Ht 6' (1.829 m) Comment: per [facility] 09/2018  Wt 83.5 kg   SpO2 91%   BMI 24.95 kg/m   Neurological exam: Ment: Alert and oriented.  Able to follow all commands.  Speech is fluent with some stuttering which I believe is baseline.  No aphasia. CN: Pupils equal round reactive, extraocular muscles intact, face symmetrical, smile symmetrical, facial sensation intact, tongue is midline, vision intact, hearing intact. Motor: Moving all extremities antigravity with no difficulty. Sensory: Intact throughout Cerebellar: Finger-nose-finger intact  Paraneoplastic panel has resulted: NMDA Cell-based IFA      Negative             AMPAR1/R2 Cell-based IFA    Negative             GABA-B Receptor Cell-based IFA Negative            .  LGI-1 Cell-based IFA      Negative              CASPR2 Cell-based IFA     Negative             .  DPPX Cell-based IFA      Negative                        Assessment: 64 year old male who presented with status epilepticus and MRI findings consistent with acute focal seizure-related cortical edema -- Exam today shows all cranial nerves intact, moving all extremities antigravity, mentation intact, sensation intact.  No clinical seizure activity seen.  -- Phenytoin level drawn yesterday came back subtherapeutic at 3.3. -- Repeat MRI brain (7/26): Improving and nearly completely resolved abnormal signal in the left medial frontal lobe. This is likely due to previous seizure activity.  -- Paraneoplastic panel has come back negative.    Recommendations:  -- Continue Dilantin ER at 200 mg BID  -- Will need repeat phenytoin level in 4 days.  -- Continue Keppra at 500 mg BID -- Inpatient seizure precautions.  -- Outpatient seizure precautions: Per [facility] statutes, patients with seizures are not allowed to drive until  they have been seizure-free for six months. Use caution when using heavy equipment or power tools. Avoid working on ladders or at heights. Take showers instead of baths. Ensure the water temperature is not too high on the home water heater. Do not go swimming alone. When caring for infants or small children, sit down when holding, feeding, or changing them to minimize risk of injury to the child in the event you have a seizure. Also, Maintain good sleep hygiene. Avoid alcohol. --At this time neurology will sign off.  Please call with any further questions thank you.  Electronically signed: Dr. Kerney Elbe

## 2019-08-07 NOTE — Progress Notes (Signed)
Subjective:   Overnight patient pulled out his PIV. However, Abx were finished and loading dose of Dilantin per pharmacy also finished. Other medications all switched to PO.  Patient is seen at bedside today. He is repeating the same complaints of poor sleep and PO intake from the last few days, which conflicts with our observations. Discussed plan of d/c today. Patient refuses to leave claiming that he can't walk or have no clean clothes. Assure patient that he will have a reasonable discharge plan for his safety and will be provided medications.  Objective:  Vital signs in last 24 hours: Vitals:   08/06/19 0424 08/06/19 0532 08/06/19 2100 08/07/19 0535  BP: (!) 96/56 (!) 103/53 (!) 98/60 (!) 91/59  Pulse: 89 78 102 72  Resp: 19 13 15 14   Temp: 98.8 F (37.1 C)  98.4 F (36.9 C) 98.5 F (36.9 C)  TempSrc: Oral  Oral Oral  SpO2: 90% 94% 93% 91%  Weight:      Height:        Physical Exam Constitutional: no acute distress Head: atraumatic ENT: external ears normal Eyes: EOMI Cardiovascular: regular rate and rhythm, normal heart sounds Pulmonary: effort normal, lungs clear to ascultation bilaterally Abdominal: flat, nontender, no rebound tenderness, bowel sounds normal Musculoskeletal: contraction of R thumb, 4th, and 5th digits Skin: warm and dry Neurological: alert, no focal deficit Psychiatric: anxious and speaking quickly, but cohesive  Assessment/Plan: Andrew Chaney is a 64 y.o. male with hx of bipolar 1, homelessness presenting in status epilepticus, now resolved after ativan, fosphenytoin, dilantin, and keppra. Developed hypotension despite IVF and transferred to ICU requiring Levophed on 7/16, BP improved and transferred back to IMTS. Also found to have MSSA bacteremia 2/2 RUE cellulitis, Abx completed and repeat BCx negative.  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 Initially treated with ativan, fosphenytoin, dilantin, and keppra. Now dilantin and Keppra only. MRI yesterday with near resolution of T2 FLAIR hyperintensity likely due to seizure. -appreciate neurology recs -f/u NMDA receptor, paraneoplastic panel negative -continue Keppra 500mg  BID -continue Dilantin 200mg  BID, re-check level in 4 days     -Dilantin level 3.3 yesterday, loading dose and adjustments by pharmacy     -per pharmacy, ultimately will require dosage probably around 400mg  BID -f/u with neuro outpatient  #MSSA Bacteremiawithout endocarditis #RUE Cellulitis Patient developed erythema, swelling in RUE at IV insertion point 7/18. CT revealed cellulitis, no DVT per u/s. BCx returned MSSA, empirically covered with Ancef. TTE 7/21 showed mobile structure, appears to be attached to MV chord.TEErevealed no vegetations. Repeat blood culture on 7/21 with no growth. Ancef since 7/19-7/26, nafcillin on 7/22-23. Oritavancin infusion on 7/26. -ID signed off -infusion of oritavancin today, stop ancef, no further Abx needed per ID -Tylenol PRN for fever  #Hypotension-improved BP continues to be soft, butstable. Hypotension likely due to Dilantin. -Continue midodrine 10mg  TID  #Bipolar 1 disorder #Psychotic disorder with delusions Previously had active psychosis, now improved but still somewhat agitated. -Continuepaliperidone -Hydroxyzine 25mg  TID PRNpreferred over benzos -Ativan 0.5-1 mg q4 PRN -Outpatient psychiatry upon d/c  #Hepatitis C HCV  Ab positive,HCV RNA w/ 11 -Follow-up ID outpatient for further hepC management  #Prior thiamine  deficiency #Alcohol Use -Completed high dose thiamine, cont. Thiamine 100 mg qd  #Leukopenia Smear review with unremarkable leukocytes.  #Dupuytren's contracture of R. Thumb, 4th and 5th digit Fingersplint attempted, but was unable to stretch fingers enough. -outpatient injection versus surgery  Diet: regular  IVF: none VTE: Lovenox Prior to Admission Living Arrangement: homeless shelter Anticipated Discharge Location: unclear Barriers to Discharge: bacteremia, placement Dispo: Anticipated discharge today  Andrew Au, MD 08/07/2019, 6:25 AM Pager: [phone] After 5pm on weekdays and 1pm on weekends: On Call pager [phone]

## 2019-08-07 NOTE — Progress Notes (Signed)
Pt is AOx3. VS per flowsheet. Denied pain or SOB. On tele. On Room air. Condom cath CDI. See flowsheet for full assessment. Pt is being D/C today, but pt has been refusing. Stating " I will die" pt was not threatening to kill himself, but stating if we D/C him he will die on the streets. Pt was offered a voucher to be taken back to the shelter but stated he can't go back there. Pt was willing to leave, but agitated. Security has been called to escort the pt out.

## 2019-08-07 NOTE — TOC Transition Note (Signed)
Transition of Care [facility]) - CM/SW Discharge Note   Patient Details  Name: Andrew Chaney MRN: [id number] Date of Birth: [birth year]  [facility] CM[facility]SW Contact:  Geralynn Ochs, LCSW Phone Number: 08/07/2019, 3:56 PM   Clinical Narrative:   CSW coordinated with RN to work towards patient discharge. Patient finally agreeable to return to shelter, after he had been refusing. CSW provided cab voucher for patient and ensured that patient's medications would be delivered prior to discharge. No other needs identified at this time.     Final next level of care: Homeless Shelter Barriers to Discharge: Barriers Resolved   Patient Goals and CMS Choice Patient states their goals for this hospitalization and ongoing recovery are:: Feel better      Discharge Placement                       Discharge Plan and Services In-house Referral: Clinical Social Work Discharge Planning Services: Follow-up appt scheduled, MATCH Program, Medication Assistance Post Acute Care Choice: NA                               Social Determinants of Health (SDOH) Interventions     Readmission Risk Interventions No flowsheet data found.

## 2019-08-07 NOTE — Discharge Summary (Signed)
Name: Andrew Chaney MRN: [id number] DOB: 09-29-1955 64 y.o. PCP: Patient, No Pcp Per  Date of Admission: 07/26/2019 10:44 AM Date of Discharge: 08/07/19 Attending Physician: Andrew Falcon, MD  Discharge Diagnosis: 1. Status epilepticus 2. MSSA bacteremia 3. Psychotic disorder with delusions 4. Hepatitis C 5. Dupuytren's contracture   Discharge Medications: Allergies as of 08/07/2019   No Known Allergies     Medication List    STOP taking these medications   benztropine 0.5 MG tablet Commonly known as: COGENTIN   chlorhexidine 0.12 % solution Commonly known as: Peridex   risperiDONE 2 MG tablet Commonly known as: RISPERDAL   temazepam 30 MG capsule Commonly known as: RESTORIL   traZODone 100 MG tablet Commonly known as: DESYREL     TAKE these medications   hydrOXYzine 50 MG tablet Commonly known as: ATARAX/VISTARIL Take 1 tablet (50 mg total) by mouth 3 (three) times daily as needed for anxiety.   levETIRAcetam 500 MG tablet Commonly known as: KEPPRA Take 1 tablet (500 mg total) by mouth 2 (two) times daily.   midodrine 10 MG tablet Commonly known as: PROAMATINE Take 1 tablet (10 mg total) by mouth 3 (three) times daily with meals.   paliperidone 6 MG 24 hr tablet Commonly known as: INVEGA Take 1 tablet (6 mg total) by mouth daily. Start taking on: August 08, 2019   phenytoin 200 MG ER capsule Commonly known as: DILANTIN Take 1 capsule (200 mg total) by mouth 2 (two) times daily.   ramelteon 8 MG tablet Commonly known as: ROZEREM Take 1 tablet (8 mg total) by mouth at bedtime.   thiamine 100 MG tablet Take 1 tablet (100 mg total) by mouth daily. Start taking on: August 08, 2019       Disposition and follow-up:   [name] was discharged from [facility] in Stable condition.  At the hospital follow up visit please address:  1.  Follow up     A. Status epilepticus - check dilantin level on 7/30, adjust dose as needed      B. Hypotension - discharged with BP 90s/50s, assess need to continue with midodrine     C. Psychotic disorder with delusions, Bipolar 1 - started on Invega, modify medications as needed     D. Hepatitis C - follow up with infectious disease     E. Dupuytren's contracture - splint unable to be applied here, follow up for injection vs surgery  2.  Labs / imaging needed at time of follow-up: dilantin level, CBC with diff  3.  Pending labs/ test needing follow-up: none  Follow-up Appointments:  Follow-up Information    [facility]. Go to.   Why: For follow up psychiatric medication help Contact information: [address]  [address]  [place] [address] [place] Follow up.   WhyVella Kohler NP, Wednesday August 08, 2019 at 1110 am  Contact information: [place] [phone]              Hospital Course by problem list: Status epilepticus, resolved Patient presented with altered mental status via EMS on 7/15 from homeless shelter, with seizures reported at shelter and during EMS transport. EEG here with continuous R-sided seizures. Treated with Ativan and loaded by neurology with Keppra and fosphenytoin, afterwards maintained on Keppra and phenytoin. Seizures aborted, and no recent seizures. Was admitted to the ICU that night for low  blood pressure requiring Levophed, possibly 2/2 phenytoin requirement. MRI with abnormal signal in the L medial frontal lobe. This was nearly resolved upon repeat MRI 1 week later, and thought likely due to prior seizure activity. No evidence of infection, and patient declined LP. Transferred out of ICU on 7/17 by which time he was no longer encephalopathic, and has not had further seizures. Discharged on Dilantin and Keppra. Paraneoplastic panel negative. Of note, his Dilantin level was found to be low for a few days, most recently 3.3 on 7/26. Appreciate  pharmacy help with dosing. Loading dose administered on 7/26, and changed PO dose to 200mg  BID. On follow up, recheck dilantin level and adjust dose as needed. Pharmacy stated that he would ultimately likely require around 400mg  BID. Recheck dilantin level in 4 days on 7/30.   MSSA bacteremia secondary to RUE thrombophlebitis Erythema and swelling noted at IV insertion site on 7/18. CT demonstrated cellulitis, no DVT per [place]. Blood culture with MSSA. TTE initially revealed small mobile structure on MV poorly visualized, TEE demonstrated no vegetations. Covered on Ancef from 7/19-7/26, had 2 days of nafcillin. Patient had persistent issues with IV access. On 7/26 had oritavancin infusion, which concluded his Abx course. Repeat blood culture from 7/21 without growth.   Hypotension History of BP frequently in 58K-998P systolic, but other times has had BP in normal range. Presented with somewhat soft BP which became worse during night of admission not responding to IV fluids, admitted to ICU briefly for levophed requirement. Thought possibly secondary to Dilantin requirement. Now on Midodrine 10mg  TID, and recent blood pressures remain soft 90s/50s. Note that he may require further increase in Dilantin dose on outpatient basis to reach therapeutic levels. Assess BP on follow up and consider midodrine dose.  Bipolar 1 disorder, psychosis with delusions History of bipolar disorder and polysubstance abuse. Had episodes of agitation including smearing self with excrement. Denies SI or HI. Evaluated by [facility], who recommended outpatient follow up and helped guide drug choice. Started on [place], and treated preferentially with hydroxyzine over ativan for episodes of agitation. Is calm and coherent at time of discharge. Will continue him on Invega. Follow up with [facility] outpatient.   Dupuytren's contracture of R. Thumb, 4th and 5th digit Fingersplint attempted, but was unable to stretch fingers enough. Chronic and  stable problem. Follow up outpatient for injection versus surgery.     Discharge Vitals:   BP (!) 87/57 (BP Location: Left Arm)   Pulse 94   Temp 98.2 F (36.8 C) (Axillary)   Resp 14   Ht 6' (1.829 m) Comment: per [facility] 09/2018  Wt 83.5 kg   SpO2 97%   BMI 24.95 kg/m   Pertinent Labs, Studies, and Procedures:  DG Abdomen 1 View  Result Date: 07/26/2019 CLINICAL DATA:  Seizures. EXAM: ABDOMEN - 1 VIEW COMPARISON:  None. FINDINGS: The bowel gas pattern is nonobstructive. There are no definite radiopaque kidney stones. There are degenerative changes of the lumbar spine and hips. There is no unexpected metallic foreign body. IMPRESSION: Nonobstructive bowel gas pattern. No unexpected metallic foreign body. Electronically Signed   By: Constance Holster M.D.   On: 07/26/2019 14:58   CT HEAD WO CONTRAST  Result Date: 07/26/2019 CLINICAL DATA:  Seizure EXAM: CT HEAD WITHOUT CONTRAST CT CERVICAL SPINE WITHOUT CONTRAST TECHNIQUE: Multidetector CT imaging of the head and cervical spine was performed following the standard protocol without intravenous contrast. Multiplanar CT image reconstructions of the cervical spine were also generated. COMPARISON:  None. FINDINGS: CT HEAD FINDINGS Brain: Area of low-density noted medially within the anterior left frontal lobe concerning for acute infarct. No areas of hemorrhage. No hydrocephalus. Vascular: No hyperdense vessel or unexpected calcification. Skull: No acute calvarial abnormality. Sinuses/Orbits: Visualized paranasal sinuses and mastoids clear. Orbital soft tissues unremarkable. Other: None CT CERVICAL SPINE FINDINGS Alignment: No subluxation. Skull base and vertebrae: No acute fracture. No primary bone lesion or focal pathologic process. Soft tissues and spinal canal: No prevertebral fluid or swelling. No visible canal hematoma. Disc levels: Degenerative disc disease diffusely, most pronounced at C5-6 and C6-7. Bilateral degenerative facet disease  diffusely, left greater than right. Upper chest: No acute findings Other: None IMPRESSION: Low-density medially within the anterior left frontal lobe concerning for acute infarct. Degenerative disc and facet disease throughout the cervical spine. No acute bony abnormality. Electronically Signed   By: Rolm Baptise M.D.   On: 07/26/2019 12:29   CT CERVICAL SPINE WO CONTRAST  Result Date: 07/26/2019 CLINICAL DATA:  Seizure EXAM: CT HEAD WITHOUT CONTRAST CT CERVICAL SPINE WITHOUT CONTRAST TECHNIQUE: Multidetector CT imaging of the head and cervical spine was performed following the standard protocol without intravenous contrast. Multiplanar CT image reconstructions of the cervical spine were also generated. COMPARISON:  None. FINDINGS: CT HEAD FINDINGS Brain: Area of low-density noted medially within the anterior left frontal lobe concerning for acute infarct. No areas of hemorrhage. No hydrocephalus. Vascular: No hyperdense vessel or unexpected calcification. Skull: No acute calvarial abnormality. Sinuses/Orbits: Visualized paranasal sinuses and mastoids clear. Orbital soft tissues unremarkable. Other: None CT CERVICAL SPINE FINDINGS Alignment: No subluxation. Skull base and vertebrae: No acute fracture. No primary bone lesion or focal pathologic process. Soft tissues and spinal canal: No prevertebral fluid or swelling. No visible canal hematoma. Disc levels: Degenerative disc disease diffusely, most pronounced at C5-6 and C6-7. Bilateral degenerative facet disease diffusely, left greater than right. Upper chest: No acute findings Other: None IMPRESSION: Low-density medially within the anterior left frontal lobe concerning for acute infarct. Degenerative disc and facet disease throughout the cervical spine. No acute bony abnormality. Electronically Signed   By: Rolm Baptise M.D.   On: 07/26/2019 12:29   MR BRAIN WO CONTRAST  Result Date: 07/27/2019 CLINICAL DATA:  Encephalopathy.  New onset seizures. EXAM: MRI  HEAD WITHOUT CONTRAST TECHNIQUE: Multiplanar, multiecho pulse sequences of the brain and surrounding structures were obtained without intravenous contrast. COMPARISON:  Head CT 07/26/2019. FINDINGS: The study is motion degraded throughout with some sequences being moderately to severely degraded. Brain: There is abnormal trace diffusion weighted signal hyperintensity involving medial left frontal lobe cortex without reduced ADC corresponding to the hypodensity on CT. There is associated T2 hyperintensity involving cortex and subcortical white matter in this region with mild gyral expansion. The ventricles are normal in size. No intracranial hemorrhage, midline shift, or extra-axial fluid collection is identified. Vascular: Limited assessment due to the degree of motion. Skull and upper cervical spine: Grossly unremarkable bone marrow signal para Sinuses/Orbits: Grossly unremarkable within limitations of motion. Other: None. IMPRESSION: 1. Motion degraded examination. 2. Cortical and subcortical signal abnormality in the medial left frontal lobe. Considerations include subacute infarct, neoplasm such as glioma, status epilepticus, and cerebritis. A repeat brain MRI to include post-contrast imaging would be helpful when the patient is better able to tolerate the examination and follow instructions. Electronically Signed   By: Logan Bores M.D.   On: 07/27/2019 14:21    EEG adult  Result Date: 07/26/2019 Lora Havens, MD  07/26/2019  5:49 PM Patient Name: Andrew Chaney MRN: [id number] Epilepsy Attending: Lora Havens Referring Physician/Provider: Dr Adaline Sill Date: 07/26/2019 Duration: 24.46 mins Patient history: 64 year old male brought to the hospital by EMS secondary to visualize 2 seizures.  While in the emergency room patient had another seizure which was described as tonic-clonic. EEG to evaluate for seizure Level of alertness:  lethargic AEDs during EEG study: LEV, ativan Technical aspects: This  EEG study was done with scalp electrodes positioned according to the 10-20 International system of electrode placement. Electrical activity was acquired at a sampling rate of 500Hz  and reviewed with a high frequency filter of 70Hz  and a low frequency filter of 1Hz . EEG data were recorded continuously and digitally stored. Description: No clear posterior dominant rhythm was seen. Sleep was characterized by sleep spindles (12 to 14 Hz), maximal frontocentral region. Eight seizures were also recorded during this study, avg 20-40 seconds, during which patient continued to be confused and not answering questions. Concomitant eeg showed 4-5hz  rhythmic sharply contoured theta slowing in left frontotemporal region which evolved to involve left hemisphere followed by 2-3hz  delta slowing. Last seizure was at 1635. EEG also showed intermittent 3 to 6 Hz theta-delta slowing in left frontotemporal region.  Hyperventilation and photic stimulation were not performed.   ABNORMALITY - Seizure, left frontotemporal region -Intermittent slow, left frontotemporal region IMPRESSION: This study showed eight seizures arising from left frontotemporal region, last seizure at 1635, avg lasting 20-40 seconds during which patient was noted to be confused and not answering questions. Additionally, there is evidence of cortical dysfunction in left frontotemporal region. Dr Rory Percy was immediately notified. Priyanka Barbra Sarks   Overnight EEG with video  Result Date: 07/27/2019 Lora Havens, MD     07/27/2019  1:53 PM Patient Name: Andrew Chaney MRN: [id number] Epilepsy Attending: Lora Havens Referring Physician/Provider: Dr Adaline Sill Duration: 07/26/2019 1638 to 07/27/2019 1053  Patient history: 64 year old male brought to the hospital by EMS secondary to visualize 2 seizures. While in the emergency room patient had another seizure which was described as tonic-clonic. EEG to evaluate for seizure  Level of alertness:  awake, asleep   AEDs during EEG study: LEV, PHT  Technical aspects: This EEG study was done with scalp electrodes positioned according to the 10-20 International system of electrode placement. Electrical activity was acquired at a sampling rate of 500Hz  and reviewed with a high frequency filter of 70Hz  and a low frequency filter of 1Hz . EEG data were recorded continuously and digitally stored.  Description: No clear posterior dominant rhythm was seen. Sleep was characterized by sleep spindles (12 to 14 Hz), maximal frontocentral region. EEG showed low amplitude 15-18Hz  generalized beta activity as well as continuous 3 to 6 Hz theta-delta slowing in left hemisphere, maximal left frontotemporal region.  Hyperventilation and photic stimulation were not performed.    ABNORMALITY -Continuous slow, left hemisphere, maximal left frontotemporal region  IMPRESSION: This study is suggestive of cortical dysfunction in left hemisphere, maximal left frontotemporal region which could be secondary to underlying structural abnormality, post-ictal state. No seizures and epileptiform discharges were seen during this study.  Lora Havens    MR BRAIN W WO CONTRAST  Result Date: 08/06/2019 CLINICAL DATA:  Seizure EXAM: MRI HEAD WITHOUT AND WITH CONTRAST TECHNIQUE: Multiplanar, multiecho pulse sequences of the brain and surrounding structures were obtained without and with intravenous contrast. CONTRAST:  7.65mL GADAVIST GADOBUTROL 1 MMOL/ML IV SOLN COMPARISON:  MRI head 07/28/2019 and 07/27/2019 FINDINGS: Brain: Improving abnormality  in the left medial frontal lobe. Mild residual FLAIR hyperintensity in the cortex remains. Previously noted diffusion abnormality in this area has nearly completely resolved. No new areas of restricted diffusion or FLAIR hyperintensity identified. There is mild atrophy without hydrocephalus. Negative for hemorrhage or mass. Normal enhancement following contrast infusion. Vascular: Normal arterial flow voids  Skull and upper cervical spine: No focal skeletal lesion. Sinuses/Orbits: Mild mucosal edema paranasal sinuses. Small right mastoid effusion. Negative orbit Other: None IMPRESSION: Improving and nearly completely resolved abnormal signal in the left medial frontal lobe. This is likely due to previous seizure activity Atrophy and minimal chronic microvascular ischemic change in the white matter. No acute abnormality. Electronically Signed   By: Franchot Gallo M.D.   On: 08/06/2019 14:36   Phenytoin levels 7/14: 18.2 7/19: 11.7 7/24: 5.4 7/26: 3.3  7/24 Pathology review of blood smear for mild leukopenia: unremarkable  HCV RNA log10: 7.05 (positive) HCV genotype: 1a HBV core Ab: negative   Recent Results (from the past 240 hour(s))  Culture, blood (routine x 2)     Status: Abnormal   Collection Time: 07/30/19  2:05 PM   Specimen: BLOOD LEFT HAND  Result Value Ref Range Status   Specimen Description BLOOD LEFT HAND  Final   Special Requests   Final    BOTTLES DRAWN AEROBIC ONLY Blood Culture adequate volume   Culture  Setup Time   Final    GRAM POSITIVE COCCI IN CLUSTERS AEROBIC BOTTLE ONLY CRITICAL RESULT CALLED TO, READ BACK BY AND VERIFIED WITH: Park Breed [phone] FCP Performed at [facility], [address] [place], [place] [address]    Culture STAPHYLOCOCCUS AUREUS (A)  Final   Report Status 08/02/2019 FINAL  Final   Organism ID, Bacteria STAPHYLOCOCCUS AUREUS  Final      Susceptibility   Staphylococcus aureus - MIC*    CIPROFLOXACIN <=0.5 SENSITIVE Sensitive     ERYTHROMYCIN <=0.25 SENSITIVE Sensitive     GENTAMICIN <=0.5 SENSITIVE Sensitive     OXACILLIN 0.5 SENSITIVE Sensitive     TETRACYCLINE <=1 SENSITIVE Sensitive     VANCOMYCIN <=0.5 SENSITIVE Sensitive     TRIMETH/SULFA <=10 SENSITIVE Sensitive     CLINDAMYCIN <=0.25 SENSITIVE Sensitive     RIFAMPIN <=0.5 SENSITIVE Sensitive     Inducible Clindamycin NEGATIVE Sensitive     * STAPHYLOCOCCUS  AUREUS  Culture, blood (routine x 2)     Status: None   Collection Time: 07/30/19  2:05 PM   Specimen: BLOOD LEFT HAND  Result Value Ref Range Status   Specimen Description BLOOD LEFT HAND  Final   Special Requests   Final    BOTTLES DRAWN AEROBIC ONLY Blood Culture adequate volume   Culture   Final    NO GROWTH 5 DAYS Performed at [facility], [place] [address] [place], [place] [address]    Report Status 08/04/2019 FINAL  Final  Blood Culture ID Panel (Reflexed)     Status: Abnormal   Collection Time: 07/30/19  2:05 PM  Result Value Ref Range Status   Enterococcus species NOT DETECTED NOT DETECTED Final   Listeria monocytogenes NOT DETECTED NOT DETECTED Final   Staphylococcus species DETECTED (A) NOT DETECTED Final    Comment: CRITICAL RESULT CALLED TO, READ BACK BY AND VERIFIED WITH: PHARMD JEREMY F. [phone] FCP    Staphylococcus aureus (BCID) DETECTED (A) NOT DETECTED Final    Comment: Methicillin (oxacillin) susceptible Staphylococcus aureus (MSSA). Preferred therapy is anti  staphylococcal beta lactam antibiotic (Cefazolin or Nafcillin), unless clinically contraindicated. CRITICAL RESULT CALLED TO, READ BACK BY AND VERIFIED WITH: PHARMD JEREMY F. [id number] FCP    Methicillin resistance NOT DETECTED NOT DETECTED Final   Streptococcus species NOT DETECTED NOT DETECTED Final   Streptococcus agalactiae NOT DETECTED NOT DETECTED Final   Streptococcus pneumoniae NOT DETECTED NOT DETECTED Final   Streptococcus pyogenes NOT DETECTED NOT DETECTED Final   Acinetobacter baumannii NOT DETECTED NOT DETECTED Final   Enterobacteriaceae species NOT DETECTED NOT DETECTED Final   Enterobacter cloacae complex NOT DETECTED NOT DETECTED Final   Escherichia coli NOT DETECTED NOT DETECTED Final   Klebsiella oxytoca NOT DETECTED NOT DETECTED Final   Klebsiella pneumoniae NOT DETECTED NOT DETECTED Final   Proteus species NOT DETECTED NOT DETECTED Final   Serratia marcescens NOT DETECTED  NOT DETECTED Final   Haemophilus influenzae NOT DETECTED NOT DETECTED Final   Neisseria meningitidis NOT DETECTED NOT DETECTED Final   Pseudomonas aeruginosa NOT DETECTED NOT DETECTED Final   Candida albicans NOT DETECTED NOT DETECTED Final   Candida glabrata NOT DETECTED NOT DETECTED Final   Candida krusei NOT DETECTED NOT DETECTED Final   Candida parapsilosis NOT DETECTED NOT DETECTED Final   Candida tropicalis NOT DETECTED NOT DETECTED Final    Comment: Performed at [facility], [place] [address] [place], [place] [address]  Culture, blood (routine x 2)     Status: None   Collection Time: 08/01/19  8:33 AM   Specimen: BLOOD RIGHT HAND  Result Value Ref Range Status   Specimen Description BLOOD RIGHT HAND  Final   Special Requests   Final    BOTTLES DRAWN AEROBIC ONLY Blood Culture adequate volume   Culture   Final    NO GROWTH 5 DAYS Performed at [facility], [address] [place], [place] [address]    Report Status 08/06/2019 FINAL  Final  Culture, blood (routine x 2)     Status: None   Collection Time: 08/01/19  8:33 AM   Specimen: BLOOD RIGHT HAND  Result Value Ref Range Status   Specimen Description BLOOD RIGHT HAND  Final   Special Requests   Final    BOTTLES DRAWN AEROBIC ONLY Blood Culture results may not be optimal due to an inadequate volume of blood received in culture bottles   Culture   Final    NO GROWTH 5 DAYS Performed at [facility], [place] [address] [place], [place] [address]    Report Status 08/06/2019 FINAL  Final     Discharge Instructions: Discharge Instructions    Call MD for:  extreme fatigue   Complete by: As directed    Call MD for:  persistant dizziness or light-headedness   Complete by: As directed    Diet - low sodium heart healthy   Complete by: As directed    Discharge instructions   Complete by: As directed    Mr. Schoch, it was a pleasure taking care of you. You have made a great deal of progress on your health since  admission. We will provide 30 days of medication. Please keep your follow up appointment for additional prescriptions. Here are your discharge instructions.  1. START Keppra 500mg  twice a day 2. START Dilantin 200mg  twice a day, this dose may be increased upon follow up appointment 3. START midodrine 10mg  three times a day 4. START Invega 6mg  once a day 5. Take ramelteon 8mg  at bedtime for sleep 6. STOP  taking risperidone, benzotropine, and temazepam  7. Follow up with [facility] on 7/28 8. Follow up with neurology 9. Follow up with Infectious Disease for your hepatitis C 10. Follow up with psychiatry 10. Follow up with N   Increase activity slowly   Complete by: As directed       Signed: Andrew Au, MD 08/07/2019, 5:06 PM   Pager: [phone]

## 2019-08-07 NOTE — Care Management (Signed)
Will assist with discharge medications through [facility] assistance program and Transitions of Care Pharmacy.   Magdalen Spatz RN

## 2019-08-07 NOTE — Progress Notes (Signed)
His phenytoin was adjusted last PM with a load and dose increase. D/w Dr. Cheral Marker and we will continue with his adjusted dose of phenytoin last PM.  Continue phenytoin 200mg  PO BID  Onnie Boer, PharmD, [place], AAHIVP, CPP Infectious Disease Pharmacist 08/07/2019 10:21 AM

## 2019-08-08 ENCOUNTER — Other Ambulatory Visit: Payer: Self-pay

## 2019-08-08 ENCOUNTER — Ambulatory Visit: Payer: Self-pay | Attending: Nurse Practitioner | Admitting: Nurse Practitioner

## 2019-08-14 ENCOUNTER — Encounter (HOSPITAL_COMMUNITY): Payer: Self-pay | Admitting: Emergency Medicine

## 2019-08-14 ENCOUNTER — Emergency Department (HOSPITAL_COMMUNITY)
Admission: EM | Admit: 2019-08-14 | Discharge: 2019-08-14 | Disposition: A | Discharge status: Home / Self Care | Payer: Medicaid Other | Attending: Emergency Medicine | Admitting: Emergency Medicine

## 2019-08-14 DIAGNOSIS — Z008 Encounter for other general examination: Secondary | ICD-10-CM | POA: Insufficient documentation

## 2019-08-14 DIAGNOSIS — Z5321 Procedure and treatment not carried out due to patient leaving prior to being seen by health care provider: Secondary | ICD-10-CM | POA: Insufficient documentation

## 2019-08-14 LAB — CBC
HCT: 38.2 % — ABNORMAL LOW (ref 39.0–52.0)
Hemoglobin: 12.8 g/dL — ABNORMAL LOW (ref 13.0–17.0)
MCH: 32.8 pg (ref 26.0–34.0)
MCHC: 33.5 g/dL (ref 30.0–36.0)
MCV: 97.9 fL (ref 80.0–100.0)
Platelets: 355 10*3/uL (ref 150–400)
RBC: 3.9 MIL/uL — ABNORMAL LOW (ref 4.22–5.81)
RDW: 13.4 % (ref 11.5–15.5)
WBC: 6.3 10*3/uL (ref 4.0–10.5)
nRBC: 0 % (ref 0.0–0.2)

## 2019-08-14 LAB — COMPREHENSIVE METABOLIC PANEL
ALT: 14 U/L (ref 0–44)
AST: 18 U/L (ref 15–41)
Albumin: 3.5 g/dL (ref 3.5–5.0)
Alkaline Phosphatase: 46 U/L (ref 38–126)
Anion gap: 10 (ref 5–15)
BUN: 7 mg/dL — ABNORMAL LOW (ref 8–23)
CO2: 25 mmol/L (ref 22–32)
Calcium: 9.4 mg/dL (ref 8.9–10.3)
Chloride: 102 mmol/L (ref 98–111)
Creatinine, Ser: 0.69 mg/dL (ref 0.61–1.24)
GFR calc Af Amer: >60 mL/min (ref >60)
GFR calc non Af Amer: >60 mL/min (ref >60)
Glucose, Bld: 107 mg/dL — ABNORMAL HIGH (ref 70–99)
Potassium: 3.5 mmol/L (ref 3.5–5.1)
Sodium: 137 mmol/L (ref 135–145)
Total Bilirubin: 1.1 mg/dL (ref 0.3–1.2)
Total Protein: 6.4 g/dL — ABNORMAL LOW (ref 6.5–8.1)

## 2019-08-14 LAB — ETHANOL: Alcohol, Ethyl (B): 10 mg/dL — ABNORMAL HIGH (ref <10)

## 2019-08-14 NOTE — ED Triage Notes (Signed)
Pt arrives to ED via [facility] from [address] after friends called due to "needing help" pt very anxious and feeling like he has no where to go pt is currently experiencing homelessness.  Pt recently admitted to hospital for seizure.  Pt denies any SI or HI thoughts.

## 2019-08-14 NOTE — ED Notes (Signed)
Called pt no answer X3 

## 2019-08-14 NOTE — ED Notes (Signed)
Called no answer X3

## 2019-09-12 ENCOUNTER — Other Ambulatory Visit: Payer: Self-pay

## 2019-09-12 ENCOUNTER — Emergency Department (HOSPITAL_COMMUNITY)
Admission: EM | Admit: 2019-09-12 | Discharge: 2019-09-13 | Disposition: A | Discharge status: Home / Self Care | Payer: Medicaid Other | Attending: Emergency Medicine | Admitting: Emergency Medicine

## 2019-09-12 ENCOUNTER — Encounter (HOSPITAL_COMMUNITY): Payer: Self-pay

## 2019-09-12 DIAGNOSIS — Y999 Unspecified external cause status: Secondary | ICD-10-CM | POA: Insufficient documentation

## 2019-09-12 DIAGNOSIS — F17210 Nicotine dependence, cigarettes, uncomplicated: Secondary | ICD-10-CM | POA: Insufficient documentation

## 2019-09-12 DIAGNOSIS — Z23 Encounter for immunization: Secondary | ICD-10-CM | POA: Insufficient documentation

## 2019-09-12 DIAGNOSIS — C6290 Malignant neoplasm of unspecified testis, unspecified whether descended or undescended: Secondary | ICD-10-CM | POA: Insufficient documentation

## 2019-09-12 DIAGNOSIS — F19920 Other psychoactive substance use, unspecified with intoxication, uncomplicated: Secondary | ICD-10-CM | POA: Insufficient documentation

## 2019-09-12 DIAGNOSIS — Y9389 Activity, other specified: Secondary | ICD-10-CM | POA: Insufficient documentation

## 2019-09-12 DIAGNOSIS — S01312A Laceration without foreign body of left ear, initial encounter: Secondary | ICD-10-CM | POA: Insufficient documentation

## 2019-09-12 DIAGNOSIS — Y9289 Other specified places as the place of occurrence of the external cause: Secondary | ICD-10-CM | POA: Insufficient documentation

## 2019-09-12 DIAGNOSIS — R600 Localized edema: Secondary | ICD-10-CM | POA: Insufficient documentation

## 2019-09-12 LAB — CBC
HCT: 34.8 % — ABNORMAL LOW (ref 39.0–52.0)
Hemoglobin: 11.2 g/dL — ABNORMAL LOW (ref 13.0–17.0)
MCH: 32.5 pg (ref 26.0–34.0)
MCHC: 32.2 g/dL (ref 30.0–36.0)
MCV: 100.9 fL — ABNORMAL HIGH (ref 80.0–100.0)
Platelets: 406 10*3/uL — ABNORMAL HIGH (ref 150–400)
RBC: 3.45 MIL/uL — ABNORMAL LOW (ref 4.22–5.81)
RDW: 14 % (ref 11.5–15.5)
WBC: 6.2 10*3/uL (ref 4.0–10.5)
nRBC: 0 % (ref 0.0–0.2)

## 2019-09-12 MED ORDER — HALOPERIDOL LACTATE 5 MG/ML IJ SOLN
5.0000 mg | Freq: Once | INTRAMUSCULAR | Status: AC
  Administered 2019-09-12: 5 mg via INTRAMUSCULAR
  Filled 2019-09-12: qty 1

## 2019-09-12 MED ORDER — LORAZEPAM 2 MG/ML IJ SOLN
1.0000 mg | Freq: Once | INTRAMUSCULAR | Status: AC
  Administered 2019-09-12: 1 mg via INTRAMUSCULAR
  Filled 2019-09-12: qty 1
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
## 2019-09-13 ENCOUNTER — Emergency Department (HOSPITAL_COMMUNITY): Payer: Medicaid Other

## 2019-09-13 LAB — RAPID URINE DRUG SCREEN, HOSP PERFORMED
Amphetamines: NOT DETECTED
Barbiturates: NOT DETECTED
Benzodiazepines: NOT DETECTED
Cocaine: NOT DETECTED
Opiates: NOT DETECTED
Tetrahydrocannabinol: POSITIVE — AB

## 2019-09-13 LAB — COMPREHENSIVE METABOLIC PANEL
ALT: 18 U/L (ref 0–44)
AST: 32 U/L (ref 15–41)
Albumin: 3.4 g/dL — ABNORMAL LOW (ref 3.5–5.0)
Alkaline Phosphatase: 49 U/L (ref 38–126)
Anion gap: 10 (ref 5–15)
BUN: 5 mg/dL — ABNORMAL LOW (ref 8–23)
CO2: 24 mmol/L (ref 22–32)
Calcium: 9.3 mg/dL (ref 8.9–10.3)
Chloride: 100 mmol/L (ref 98–111)
Creatinine, Ser: 0.7 mg/dL (ref 0.61–1.24)
GFR calc Af Amer: >60 mL/min (ref >60)
GFR calc non Af Amer: >60 mL/min (ref >60)
Glucose, Bld: 86 mg/dL (ref 70–99)
Potassium: 3.4 mmol/L — ABNORMAL LOW (ref 3.5–5.1)
Sodium: 134 mmol/L — ABNORMAL LOW (ref 135–145)
Total Bilirubin: 0.9 mg/dL (ref 0.3–1.2)
Total Protein: 6.8 g/dL (ref 6.5–8.1)

## 2019-09-13 LAB — SALICYLATE LEVEL: Salicylate Lvl: <7 mg/dL — ABNORMAL LOW (ref 7.0–30.0)

## 2019-09-13 LAB — ETHANOL: Alcohol, Ethyl (B): 68 mg/dL — ABNORMAL HIGH (ref <10)

## 2019-09-13 LAB — ACETAMINOPHEN LEVEL: Acetaminophen (Tylenol), Serum: <10 ug/mL — ABNORMAL LOW (ref 10–30)

## 2019-09-13 MED ORDER — BUPIVACAINE HCL (PF) 0.5 % IJ SOLN
10.0000 mL | Freq: Once | INTRAMUSCULAR | Status: AC
  Administered 2019-09-13: 10 mL
  Filled 2019-09-13: qty 10

## 2019-09-13 MED ORDER — CEPHALEXIN 500 MG PO CAPS: 500.0000 mg | ORAL_CAPSULE | Freq: Three times a day (TID) | ORAL | 0 refills | Status: AC

## 2019-09-13 MED ORDER — CEFAZOLIN SODIUM-DEXTROSE 1-4 GM/50ML-% IV SOLN
1.0000 g | Freq: Once | INTRAVENOUS | Status: AC
  Administered 2019-09-13: 1 g via INTRAVENOUS
  Filled 2019-09-13: qty 50

## 2019-09-13 MED ORDER — TETANUS-DIPHTH-ACELL PERTUSSIS 5-2.5-18.5 LF-MCG/0.5 IM SUSP
0.5000 mL | Freq: Once | INTRAMUSCULAR | Status: AC
  Administered 2019-09-13: 0.5 mL via INTRAMUSCULAR
  Filled 2019-09-13: qty 0.5
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
## 2019-09-13 NOTE — ED Provider Notes (Signed)
LACERATION REPAIR Performed by: Dewaine Oats Authorized by: Dewaine Oats Consent: Verbal consent obtained. Risks and benefits: risks, benefits and alternatives were discussed Consent given by: patient Patient identity confirmed: provided demographic data Prepped and Draped in normal sterile fashion Wound explored  Laceration Location: left ear  Laceration Length: 5 cm, stellate  No Foreign Bodies seen or palpated  Anesthesia: local infiltration, pre-and post-auricular block  Local anesthetic: lidocaine 1% w/o epinephrine  Anesthetic total: 6 ml  Irrigation method: syringe Amount of cleaning: extensive Debridement of exposed cartilage Stellate wound involving preauricular ear and auricle   Skin closure: 5-0 Vicryl rapide  Number of sutures: 9  Technique: simple interrupted  Patient tolerance: Patient tolerated the procedure well with no immediate complications.    Charlann Lange, PA-C 09/13/19 [place], MD 09/13/19 [phone]

## 2019-09-15 ENCOUNTER — Other Ambulatory Visit: Payer: Self-pay

## 2019-09-15 ENCOUNTER — Encounter (HOSPITAL_COMMUNITY): Payer: Self-pay

## 2019-09-15 ENCOUNTER — Emergency Department (HOSPITAL_COMMUNITY): Payer: Self-pay

## 2019-09-15 ENCOUNTER — Emergency Department (HOSPITAL_COMMUNITY)
Admission: EM | Admit: 2019-09-15 | Discharge: 2019-09-15 | Payer: Self-pay | Attending: Emergency Medicine | Admitting: Emergency Medicine

## 2019-09-15 DIAGNOSIS — R350 Frequency of micturition: Secondary | ICD-10-CM | POA: Insufficient documentation

## 2019-09-15 DIAGNOSIS — F17210 Nicotine dependence, cigarettes, uncomplicated: Secondary | ICD-10-CM | POA: Insufficient documentation

## 2019-09-15 DIAGNOSIS — M79642 Pain in left hand: Secondary | ICD-10-CM

## 2019-09-15 DIAGNOSIS — I82220 Acute embolism and thrombosis of inferior vena cava: Secondary | ICD-10-CM | POA: Insufficient documentation

## 2019-09-15 DIAGNOSIS — R600 Localized edema: Secondary | ICD-10-CM

## 2019-09-15 DIAGNOSIS — I871 Compression of vein: Secondary | ICD-10-CM

## 2019-09-15 LAB — CBC WITH DIFFERENTIAL/PLATELET
Abs Immature Granulocytes: 0.02 10*3/uL (ref 0.00–0.07)
Basophils Absolute: 0 10*3/uL (ref 0.0–0.1)
Basophils Relative: 1 %
Eosinophils Absolute: 0 10*3/uL (ref 0.0–0.5)
Eosinophils Relative: 1 %
HCT: 33.6 % — ABNORMAL LOW (ref 39.0–52.0)
Hemoglobin: 11 g/dL — ABNORMAL LOW (ref 13.0–17.0)
Immature Granulocytes: 0 %
Lymphocytes Relative: 16 %
Lymphs Abs: 1.1 10*3/uL (ref 0.7–4.0)
MCH: 32.7 pg (ref 26.0–34.0)
MCHC: 32.7 g/dL (ref 30.0–36.0)
MCV: 100 fL (ref 80.0–100.0)
Monocytes Absolute: 0.6 10*3/uL (ref 0.1–1.0)
Monocytes Relative: 9 %
Neutro Abs: 5.1 10*3/uL (ref 1.7–7.7)
Neutrophils Relative %: 73 %
Platelets: 365 10*3/uL (ref 150–400)
RBC: 3.36 MIL/uL — ABNORMAL LOW (ref 4.22–5.81)
RDW: 13.6 % (ref 11.5–15.5)
WBC: 6.9 10*3/uL (ref 4.0–10.5)
nRBC: 0 % (ref 0.0–0.2)

## 2019-09-15 LAB — COMPREHENSIVE METABOLIC PANEL
ALT: 16 U/L (ref 0–44)
AST: 25 U/L (ref 15–41)
Albumin: 3.6 g/dL (ref 3.5–5.0)
Alkaline Phosphatase: 58 U/L (ref 38–126)
Anion gap: 15 (ref 5–15)
BUN: 8 mg/dL (ref 8–23)
CO2: 21 mmol/L — ABNORMAL LOW (ref 22–32)
Calcium: 9.4 mg/dL (ref 8.9–10.3)
Chloride: 102 mmol/L (ref 98–111)
Creatinine, Ser: 0.52 mg/dL — ABNORMAL LOW (ref 0.61–1.24)
GFR calc Af Amer: >60 mL/min (ref >60)
GFR calc non Af Amer: >60 mL/min (ref >60)
Glucose, Bld: 90 mg/dL (ref 70–99)
Potassium: 4.2 mmol/L (ref 3.5–5.1)
Sodium: 138 mmol/L (ref 135–145)
Total Bilirubin: 0.8 mg/dL (ref 0.3–1.2)
Total Protein: 6.6 g/dL (ref 6.5–8.1)

## 2019-09-15 LAB — CK: Total CK: 146 U/L (ref 49–397)

## 2019-09-15 LAB — BRAIN NATRIURETIC PEPTIDE: B Natriuretic Peptide: 57.8 pg/mL (ref 0.0–100.0)

## 2019-09-15 MED ORDER — SODIUM CHLORIDE (PF) 0.9 % IJ SOLN
INTRAMUSCULAR | Status: AC
  Filled 2019-09-15: qty 100

## 2019-09-15 MED ORDER — IOHEXOL 350 MG/ML SOLN
125.0000 mL | Freq: Once | INTRAVENOUS | Status: AC | PRN
  Administered 2019-09-15: 125 mL via INTRAVENOUS

## 2019-09-15 MED ORDER — FUROSEMIDE 20 MG PO TABS: 20.0000 mg | ORAL_TABLET | Freq: Every day | ORAL | 0 refills | Status: DC

## 2019-09-15 NOTE — ED Provider Notes (Signed)
[facility] DEPT Provider Note   CSN: [id number] Arrival date & time: 09/15/19  0746     History Chief Complaint  Patient presents with  . Leg Swelling  . Hand Pain    Andrew Chaney is a 64 y.o. male.  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  Patient presents today to emergency department via [employer] after he was found breaking and entering this morning.  Per [employer] he believes he is Jesus.  He states that he was told to go to his house by a friend but when he got there the police were called. Patient states that his primary concern today is his bilateral leg swelling he states that his feet are painful and swollen.  He states it has been going on for greater than 1 year.  He states that has been progressively worsening however.  Patient states that his bilateral leg/foot pain is achy, constant, worse with movement and touch and walking.  He states that he has been peeing frequently he denies any dysuria frequency urgency denies abdominal pain chest pain or shortness of breath.  Denies any nausea vomiting or diarrhea.  The Lasix or other diuretic use.  He states he takes no medications.   EMR my review of EMR it appears the patient had TEE echocardiogram done during hospital visit 7/23.  This showed EF of 60-65%.    Past Medical History:  Diagnosis Date  . Bipolar 1 disorder ([place])   . Cancer [facility]) 2011   Germ Cell Seminoma   . Renal disorder     Patient Active Problem List   Diagnosis Date Noted  . Dupuytren's contracture 08/05/2019  . MSSA bacteremia 07/31/2019  . Altered mental status   . Fever 07/30/2019  . Non-purulent Cellulitis of right upper arm 07/30/2019  . 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[place]) 07/29/2019  .  Seizure ([place]) 07/26/2019  . Status epilepticus ([place]) 07/26/2019  . Psychosis ([place]) 09/05/2018  . Bipolar disorder, most recent episode manic ([place]) 04/11/2018  . Bipolar I disorder, current or most recent episode manic, with psychotic features ([place]) 04/08/2018  . Confusion 01/03/2015  . Chronic hepatitis C ([place]) 12/20/2012  . Renal failure 12/17/2012  . Acute encephalopathy 12/17/2012  . ARF (acute renal failure) ([place]) 12/17/2012  . Hyponatremia 12/17/2012  . CAP (community acquired pneumonia) 12/17/2012  . Rhabdomyolysis 12/17/2012  . 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[place]) 12/17/2012    Past Surgical History:  Procedure Laterality Date  . TEE WITHOUT CARDIOVERSION N/A 08/03/2019   Procedure: TRANSESOPHAGEAL ECHOCARDIOGRAM (TEE);  Surgeon: Pixie Casino, MD;  Location: [facility] ENDOSCOPY;  Service: Cardiovascular;  Laterality: N/A;       Family History  Problem Relation Age of Onset  . Dementia Mother     Social History   Tobacco Use  . Smoking status: Current Every Day Smoker    Packs/day: 0.50    Types: Cigarettes  . Smokeless tobacco: Never Used  Vaping Use  . Vaping Use: Never used  Substance Use Topics  . Alcohol use: Yes  . Drug use: Yes    Types: Marijuana    Comment: States no longer uses marijuana because he is "on probation"    Home Medications Prior to Admission medications   Medication Sig Start Date End Date Taking? Authorizing Provider  cephALEXin (KEFLEX) 500  MG capsule Take 1 capsule (500 mg total) by mouth 3 (three) times daily for 5 days. 09/13/19 09/18/19  Fatima Blank, MD  furosemide (LASIX) 20 MG tablet Take 1 tablet (20 mg total) by mouth daily for 5 days. 09/15/19 09/20/19  Tedd Sias, PA  levETIRAcetam (KEPPRA) 500 MG tablet Take 1 tablet (500 mg total) by mouth 2 (two) times daily. 08/07/19 09/06/19  Andrew Au, MD  paliperidone (INVEGA) 6 MG 24 hr tablet Take 1 tablet (6 mg total) by mouth daily. 08/08/19 09/07/19  Andrew Au, MD    phenytoin (DILANTIN) 200 MG ER capsule Take 1 capsule (200 mg total) by mouth 2 (two) times daily. 08/07/19 09/06/19  Andrew Au, MD  ramelteon (ROZEREM) 8 MG tablet Take 1 tablet (8 mg total) by mouth at bedtime. 08/07/19 09/06/19  Andrew Au, MD    Allergies    Patient has no known allergies.  Review of Systems   Review of Systems  Constitutional: Negative for chills and fever.  HENT: Negative for congestion.   Respiratory: Negative for cough and shortness of breath.   Cardiovascular: Negative for chest pain and leg swelling.  Gastrointestinal: Negative for abdominal pain and vomiting.  Genitourinary: Positive for frequency. Negative for dysuria.  Musculoskeletal: Negative for myalgias.       Bilateral leg pain, swelling   Skin: Negative for rash.  Neurological: Negative for headaches.    Physical Exam Updated Vital Signs BP (!) 137/92 (BP Location: Left Arm)   Pulse 98   Temp 97.7 F (36.5 C) (Oral)   Resp 18   Ht 6' (1.829 m)   Wt 71.2 kg   SpO2 100%   BMI 21.29 kg/m   Physical Exam          ED Results / Procedures / Treatments   Labs (all labs ordered are listed, but only abnormal results are displayed) Labs Reviewed  COMPREHENSIVE METABOLIC PANEL - Abnormal; Notable for the following components:      Result Value   CO2 21 (*)    Creatinine, Ser 0.52 (*)    All other components within normal limits  CBC WITH DIFFERENTIAL/PLATELET - Abnormal; Notable for the following components:   RBC 3.36 (*)    Hemoglobin 11.0 (*)    HCT 33.6 (*)    All other components within normal limits  BRAIN NATRIURETIC PEPTIDE  CK    EKG None  Radiology DG Hand Complete Left  Result Date: 09/15/2019 CLINICAL DATA:  Left hand pain.  Second metacarpal phalangeal pain EXAM: LEFT HAND - COMPLETE 3+ VIEW COMPARISON:  03/20/2018 FINDINGS: Foreshortening and angulation of the third through fifth metacarpals where there were fractures seen on prior. No acute fracture or  subluxation. IMPRESSION: 1. No acute finding. 2. Deformity of the third through fifth metacarpals from remote fractures which have healed since 2020. Electronically Signed   By: Monte Fantasia M.D.   On: 09/15/2019 09:04   CT VENOGRAM ABD/PEL  Result Date: 09/15/2019 CLINICAL DATA:  Severe bilateral lower extremity swelling. Concern for obstructive anatomy within the abdomen or pelvis. History of germ-cell seminoma. EXAM: CT ABDOMEN AND PELVIS WITH CONTRAST TECHNIQUE: Multidetector CT imaging of the abdomen and pelvis was performed using the standard protocol following bolus administration of intravenous contrast. CONTRAST:  172mL OMNIPAQUE IOHEXOL 350 MG/ML SOLN COMPARISON:  None. FINDINGS: Lower chest: Limited visualization of lower thorax is negative for focal airspace opacity or pleural effusion. No focal airspace opacity. Normal heart size.  No pericardial effusion. Hepatobiliary: Normal hepatic contour. There is a minimal amount of focal fatty infiltration adjacent to the fissure for the ligamentum teres. No discrete hepatic lesions. Normal appearance of the gallbladder given degree of distention. No radiopaque gallstones. Pancreas: There are 2 punctate (sub 7 mm) hypoattenuating nodules within the head of an otherwise normal appearing pancreas, too small to accurately characterize. No downstream pancreatic ductal dilatation or atrophy. Spleen: Normal appearance of the spleen. Adrenals/Urinary Tract: There is symmetric enhancement in excretion of the bilateral kidneys. No evidence of nephrolithiasis on this postcontrast examination. No discrete renal lesions. No urine obstruction or perinephric stranding. Normal appearance the bilateral adrenal glands. Normal appearance of the urinary bladder given degree of distention. Stomach/Bowel: Moderate to large colonic stool burden without evidence of enteric obstruction. Normal appearance of the terminal ileum. The appendix is not visualized, however there is no  pericecal inflammatory change. Vascular/Lymphatic: Mild tortuosity but normal caliber of the abdominal aorta. The major branch vessels of the abdominal aorta appear patent on this non CTA examination. 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 There is no evidence of central acute DVT. Specifically, no evidence of discrete filling defect or the bilateral common and external iliac veins appear patent as do the bilateral common femoral veins and imaged portions of the bilateral deep and superficial femoral veins. Reproductive: Normal appearance of the prostate. No free fluid the pelvic cul-de-sac. Other: Diffuse body wall edema. Musculoskeletal: No acute or aggressive osseous abnormalities. Mild (approximately 25%) compression deformity involving the superior endplate of the L1 vertebral body without associated fracture or paraspinal hematoma. Mild-to-moderate multilevel lumbar spine DDD, worse at L4-L5 and L5-S1 with disc space height loss, endplate irregularity and sclerosis. IMPRESSION: 1. 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 The etiology of this occlusion is indeterminate though given history of germ cell seminoma, potentially this is the sequela of previous DVT or radiation therapy. Presently, there is no evidence of central DVT. 2. 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 Recommend follow up pre and post contrast MRI/MRCP or pancreatic protocol CT in 1 year. This recommendation  follows ACR consensus guidelines: Management of Incidental Pancreatic Cysts: A White Paper of the ACR Incidental Findings Committee. [place] 8416;60:630-160. Electronically Signed   By: Sandi Mariscal M.D.   On: 09/15/2019 12:18    Procedures Procedures (including critical care time)  Medications Ordered in ED Medications  sodium chloride (PF) 0.9 % injection (has no administration in time range)  iohexol (OMNIPAQUE) 350 MG/ML injection 125 mL (125 mLs Intravenous Contrast Given 09/15/19 1144)    ED Course  I have reviewed the triage vital signs and the nursing notes.  Pertinent labs & imaging results that were available during my care of the patient were reviewed by me and considered in my medical decision making (see chart for details).    MDM Rules/Calculators/A&P                          Patient is 65 year old male presented today with complaints of bilateral leg swelling pain this is been potentially ongoing for a year.  Has  history of seminoma.  Physical exam is notable for significant lower extremity swelling.  He does have also some scant pitting edema.  It is difficult to determine the chronicity of this given the patient is a poor historian.  He is also unable to inform me whether he has had any radiation therapy or not.  I discussed this case with my attending physician who cosigned this note including patient's presenting symptoms, physical exam, and planned diagnostics and interventions. Attending physician stated agreement with plan or made changes to plan which were implemented.   Attending physician assessed patient at bedside.  BNP within normal limits doubt CHF as cause of his lower extremity edema.  CK within normal limits he has a history of rapid exam.  Issue today.  CBC without leukocytosis or significant anemia.  CMP without electrolyte abnormality.  No acute abnormality on CMP or CBC.  Patient had left hand x-ray in triage because of old hand pain however  this is been a chronic issue for him no changes today.  I reviewed the left hand x-ray which shows chronic old well-healed fractures.  CT venogram of the abdomen pelvis with bilateral lower extremity runoff shows stenosis of the IVC.  I discussed with Dr. Oneida Alar of vascular surgery about this abnormality.  1:20 PM discussed with Dr. Oneida Alar of vascular surgery.  Patient will follow up and have a scheduled appointment with Dr. Oneida Alar.  No need for major intervention at this time.   Discharge patient with 20 of Lasix use once daily as prescribed.  He will be on this for 5 days and will need to have recheck of his electrolytes and kidney function at that time.  Patient understanding of plan.  Discharge to jail with [employer].  Final Clinical Impression(s) / ED Diagnoses Final diagnoses:  IVC obstruction  Bilateral lower extremity edema  Left hand pain    Rx / DC Orders ED Discharge Orders         Ordered    furosemide (LASIX) 20 MG tablet  Daily        09/15/19 1320           Pati Gallo [place], [place] 09/15/19 1739    Charlesetta Shanks, MD 09/16/19 [phone]

## 2019-09-15 NOTE — Discharge Instructions (Signed)
I have provided you with a medication called Lasix which will help with the swelling in your legs.  Please take 20 mg once daily as prescribed for the next 5 days.  After this you will need to have your electrolytes and kidney function rechecked.  I have also discussed your case with Dr. Oneida Alar who is a vascular surgeon.  Please call the office at [phone] to schedule an appointment.

## 2019-09-15 NOTE — ED Provider Notes (Signed)
Medical screening examination/treatment/procedure(s) were conducted as a shared visit with non-physician practitioner(s) and myself.  I personally evaluated the patient during the encounter.    Patient is brought in by [facility] for breaking and entering. He has medical conditions and large lower extremity swelling. They have brought him for medical evaluation/clearance before going to jail.  Patient reports his legs are extremely swollen. This is been incrementally getting worse over several years. He reports has been gradual since being treated for a germ cell seminoma several years ago. He reports recently it has gotten much worse and he also has general swelling of his testicles. No chest pain, shortness of breath or cough.  Patient is alert. No respiratory distress. Nontoxic in appearance. Speech is pressured and tangential. He can however focus in on his current medical problems and discuss historical issues. Lungs are clear, heart is regular, abdomen is soft and nondistended. Patient has 2-3+ edema bilateral lower extremities above the knees. Scrotum has moderate edema. Diffuse erythema consistent with inflammatory reaction. Dorsalis pedis pulses are 2+ and symmetric.  Appearance of edema is chronic. Patient however describes fairly acute worsening. We cannot obtain distinct history regarding chronic lymphedema. Patient reports history of a germ cell seminoma. CT abdomen obtained to rule out mass or obstructing condition for peripheral edema. Venous narrowing identified on CT. This has been reviewed by Joneen Roach with vascular surgery. The management of this can be continued on outpatient basis. At this time I do not find acute conditions that require hospitalization or emergent interventions.   Charlesetta Shanks, MD 09/16/19 [phone]

## 2019-09-15 NOTE — ED Triage Notes (Addendum)
Patient brought in by [employer]. Patient sent here for leg swelling and left hand pain. Patient stated that he thinks he is Jesus.

## 2019-09-26 ENCOUNTER — Other Ambulatory Visit: Payer: Self-pay

## 2019-09-26 ENCOUNTER — Emergency Department (HOSPITAL_COMMUNITY)
Admission: EM | Admit: 2019-09-26 | Discharge: 2019-09-26 | Disposition: A | Discharge status: Home / Self Care | Payer: Medicaid Other | Attending: Emergency Medicine | Admitting: Emergency Medicine

## 2019-09-26 ENCOUNTER — Encounter (HOSPITAL_COMMUNITY): Payer: Self-pay | Admitting: Emergency Medicine

## 2019-09-26 DIAGNOSIS — Z5321 Procedure and treatment not carried out due to patient leaving prior to being seen by health care provider: Secondary | ICD-10-CM | POA: Insufficient documentation

## 2019-09-26 DIAGNOSIS — M79605 Pain in left leg: Secondary | ICD-10-CM | POA: Insufficient documentation

## 2019-09-26 DIAGNOSIS — M79604 Pain in right leg: Secondary | ICD-10-CM | POA: Insufficient documentation

## 2019-09-26 DIAGNOSIS — R2243 Localized swelling, mass and lump, lower limb, bilateral: Secondary | ICD-10-CM | POA: Insufficient documentation

## 2019-09-26 NOTE — ED Triage Notes (Signed)
Patient reports bilateral leg pain with swelling for several days , denies injury/ambulatory , respirations unlabored.

## 2019-09-26 NOTE — ED Notes (Signed)
Security removed pt from floor.

## 2019-10-11 ENCOUNTER — Emergency Department (HOSPITAL_COMMUNITY)
Admission: EM | Admit: 2019-10-11 | Discharge: 2019-10-12 | Disposition: A | Discharge status: Home / Self Care | Payer: Medicaid Other | Attending: Emergency Medicine | Admitting: Emergency Medicine

## 2019-10-11 ENCOUNTER — Encounter (HOSPITAL_COMMUNITY): Payer: Self-pay | Admitting: Emergency Medicine

## 2019-10-11 ENCOUNTER — Other Ambulatory Visit: Payer: Self-pay

## 2019-10-11 DIAGNOSIS — R600 Localized edema: Secondary | ICD-10-CM | POA: Insufficient documentation

## 2019-10-11 DIAGNOSIS — Z5321 Procedure and treatment not carried out due to patient leaving prior to being seen by health care provider: Secondary | ICD-10-CM | POA: Insufficient documentation

## 2019-10-11 DIAGNOSIS — G8929 Other chronic pain: Secondary | ICD-10-CM | POA: Insufficient documentation

## 2019-10-11 DIAGNOSIS — F1020 Alcohol dependence, uncomplicated: Secondary | ICD-10-CM | POA: Insufficient documentation

## 2019-10-11 NOTE — ED Triage Notes (Signed)
Per EMS-complaining of B/L LE edema-chronic issue-using ETOH

## 2020-03-21 ENCOUNTER — Emergency Department (HOSPITAL_COMMUNITY)
Admission: EM | Admit: 2020-03-21 | Discharge: 2020-03-22 | Disposition: A | Discharge status: Home / Self Care | Payer: Medicaid Other | Attending: Emergency Medicine | Admitting: Emergency Medicine

## 2020-03-21 ENCOUNTER — Other Ambulatory Visit: Payer: Self-pay

## 2020-03-21 ENCOUNTER — Encounter (HOSPITAL_COMMUNITY): Payer: Self-pay | Admitting: Emergency Medicine

## 2020-03-21 DIAGNOSIS — F17210 Nicotine dependence, cigarettes, uncomplicated: Secondary | ICD-10-CM | POA: Insufficient documentation

## 2020-03-21 DIAGNOSIS — I739 Peripheral vascular disease, unspecified: Secondary | ICD-10-CM | POA: Insufficient documentation

## 2020-03-21 DIAGNOSIS — Z5900 Homelessness unspecified: Secondary | ICD-10-CM | POA: Insufficient documentation

## 2020-03-21 NOTE — ED Provider Notes (Cosign Needed Addendum)
[facility] DEPT Provider Note   CSN: [id number] Arrival date & time: 03/21/20  2036     History Chief Complaint  Patient presents with  . Homeless    Andrew Chaney is a 65 y.o. male.  Patient presents to the emergency department with a chief complaint of homelessness.  He has history of bipolar 1.  He states that he does not have anywhere to stay tonight.  States that the shelters are full.  States that he would like to eat breakfast.  He also complains of chronic leg swelling.  This is not a new complaint.  He has been seen for this in the past.  The history is provided by the patient. No language interpreter was used.       Past Medical History:  Diagnosis Date  . Bipolar 1 disorder ([place])   . Cancer [facility]) 2011   Germ Cell Seminoma   . Renal disorder     Patient Active Problem List   Diagnosis Date Noted  . Dupuytren's contracture 08/05/2019  . MSSA bacteremia 07/31/2019  . Altered mental status   . Fever 07/30/2019  . Non-purulent Cellulitis of right upper arm 07/30/2019  . 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[place]) 07/29/2019  . Seizure ([place]) 07/26/2019  . Status epilepticus ([place]) 07/26/2019  . Psychosis ([place]) 09/05/2018  . Bipolar disorder, most recent episode manic ([place]) 04/11/2018  . Bipolar I disorder, current or most recent episode manic, with psychotic features ([place]) 04/08/2018  . Confusion 01/03/2015  . Chronic hepatitis C ([place]) 12/20/2012  . Renal failure 12/17/2012  . Acute encephalopathy 12/17/2012  . ARF (acute renal failure) ([place]) 12/17/2012  . Hyponatremia 12/17/2012  . CAP (community acquired pneumonia) 12/17/2012  . Rhabdomyolysis 12/17/2012  . 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[place]) 12/17/2012    Past Surgical History:  Procedure Laterality Date  . TEE WITHOUT CARDIOVERSION N/A 08/03/2019   Procedure: TRANSESOPHAGEAL ECHOCARDIOGRAM (TEE);  Surgeon: Pixie Casino, MD;  Location: [facility]  ENDOSCOPY;  Service: Cardiovascular;  Laterality: N/A;       Family History  Problem Relation Age of Onset  . Dementia Mother     Social History   Tobacco Use  . Smoking status: Current Every Day Smoker    Packs/day: 0.50    Types: Cigarettes  . Smokeless tobacco: Never Used  Vaping Use  . Vaping Use: Never used  Substance Use Topics  . Alcohol use: Yes  . Drug use: Yes    Types: Marijuana    Comment: States no longer uses marijuana because he is "on probation"    Home Medications Prior to Admission medications   Medication Sig Start Date End Date Taking? Authorizing Provider  furosemide (LASIX) 20 MG tablet Take 1 tablet (20 mg total) by mouth daily for 5 days. 09/15/19 09/20/19  Tedd Sias, PA  levETIRAcetam (KEPPRA) 500 MG tablet Take 1 tablet (500 mg total) by mouth 2 (two) times daily. 08/07/19 09/06/19  Andrew Au, MD  paliperidone (INVEGA) 6 MG 24 hr tablet Take 1 tablet (6 mg total) by mouth daily. 08/08/19 09/07/19  Andrew Au, MD  phenytoin (DILANTIN) 200 MG ER capsule Take 1 capsule (200 mg total) by mouth 2 (two) times daily. 08/07/19 09/06/19  Andrew Au, MD  ramelteon (ROZEREM) 8 MG tablet Take 1 tablet (8 mg total) by mouth at bedtime. 08/07/19 09/06/19  Andrew Au, MD    Allergies    Patient has no known allergies.  Review  of Systems   Review of Systems  All other systems reviewed and are negative.   Physical Exam Updated Vital Signs BP (!) 108/51 (BP Location: Right Arm)   Pulse 96   Resp 20   SpO2 100%   Physical Exam Vitals and nursing note reviewed.  Constitutional:      Appearance: He is well-developed.  HENT:     Head: Normocephalic and atraumatic.  Eyes:     Conjunctiva/sclera: Conjunctivae normal.  Cardiovascular:     Rate and Rhythm: Normal rate and regular rhythm.     Heart sounds: No murmur heard.   Pulmonary:     Effort: Pulmonary effort is normal. No respiratory distress.     Breath sounds: Normal breath sounds.   Abdominal:     Palpations: Abdomen is soft.     Tenderness: There is no abdominal tenderness.  Musculoskeletal:        General: Normal range of motion.     Cervical back: Neck supple.     Comments: Chronic appearing peripheral vascular disease, no evidence of cellulitis or abscess  Ambulatory  Skin:    General: Skin is warm and dry.  Neurological:     Mental Status: He is alert and oriented to person, place, and time.  Psychiatric:        Mood and Affect: Mood normal.        Behavior: Behavior normal.     ED Results / Procedures / Treatments   Labs (all labs ordered are listed, but only abnormal results are displayed) Labs Reviewed - No data to display  EKG None  Radiology No results found.  Procedures Procedures   Medications Ordered in ED Medications - No data to display  ED Course  I have reviewed the triage vital signs and the nursing notes.  Pertinent labs & imaging results that were available during my care of the patient were reviewed by me and considered in my medical decision making (see chart for details).    MDM Rules/Calculators/A&P                          Patient here requesting a place to stay for the night.  He states that the shelters are full.  States that he would like to eat breakfast here.  He rattles off multiple chronic complaints, but does not appear to have any acute issues other than not having a place to stay.  He has ambulated and passed PO challenge.   Final Clinical Impression(s) / ED Diagnoses Final diagnoses:  Homeless    Rx / DC Orders ED Discharge Orders    None       Montine Circle, PA-C 03/22/20 0525    Montine Circle, PA-C 03/22/20 1751    Fatima Blank, MD 03/23/20 [phone]

## 2020-03-21 NOTE — ED Notes (Addendum)
Pt refuses to allow this writer to remove his jacket in order to obtain blood pressure. Pt also refusing to follow instructions to check pt's temperature.

## 2020-03-21 NOTE — ED Triage Notes (Signed)
Patient reports he does not know why he's here.

## 2020-03-22 ENCOUNTER — Emergency Department (HOSPITAL_COMMUNITY): Payer: Self-pay

## 2020-03-22 ENCOUNTER — Encounter (HOSPITAL_COMMUNITY): Payer: Self-pay | Admitting: Emergency Medicine

## 2020-03-22 ENCOUNTER — Emergency Department (HOSPITAL_COMMUNITY)
Admission: EM | Admit: 2020-03-22 | Discharge: 2020-03-23 | Disposition: A | Discharge status: Home / Self Care | Payer: Self-pay | Attending: Emergency Medicine | Admitting: Emergency Medicine

## 2020-03-22 DIAGNOSIS — F3164 Bipolar disorder, current episode mixed, severe, with psychotic features: Secondary | ICD-10-CM | POA: Insufficient documentation

## 2020-03-22 DIAGNOSIS — R109 Unspecified abdominal pain: Secondary | ICD-10-CM | POA: Insufficient documentation

## 2020-03-22 DIAGNOSIS — N179 Acute kidney failure, unspecified: Secondary | ICD-10-CM

## 2020-03-22 DIAGNOSIS — G4700 Insomnia, unspecified: Secondary | ICD-10-CM | POA: Diagnosis present

## 2020-03-22 DIAGNOSIS — F12250 Cannabis dependence with psychotic disorder with delusions: Secondary | ICD-10-CM | POA: Insufficient documentation

## 2020-03-22 DIAGNOSIS — F4329 Adjustment disorder with other symptoms: Secondary | ICD-10-CM | POA: Diagnosis present

## 2020-03-22 DIAGNOSIS — Z59819 Housing instability, housed unspecified: Secondary | ICD-10-CM

## 2020-03-22 DIAGNOSIS — Z599 Problem related to housing and economic circumstances, unspecified: Secondary | ICD-10-CM

## 2020-03-22 DIAGNOSIS — F29 Unspecified psychosis not due to a substance or known physiological condition: Secondary | ICD-10-CM | POA: Insufficient documentation

## 2020-03-22 DIAGNOSIS — F3173 Bipolar disorder, in partial remission, most recent episode manic: Secondary | ICD-10-CM | POA: Insufficient documentation

## 2020-03-22 DIAGNOSIS — Z8547 Personal history of malignant neoplasm of testis: Secondary | ICD-10-CM | POA: Insufficient documentation

## 2020-03-22 DIAGNOSIS — R4182 Altered mental status, unspecified: Secondary | ICD-10-CM | POA: Insufficient documentation

## 2020-03-22 DIAGNOSIS — F4325 Adjustment disorder with mixed disturbance of emotions and conduct: Secondary | ICD-10-CM | POA: Insufficient documentation

## 2020-03-22 DIAGNOSIS — R197 Diarrhea, unspecified: Secondary | ICD-10-CM

## 2020-03-22 DIAGNOSIS — F17210 Nicotine dependence, cigarettes, uncomplicated: Secondary | ICD-10-CM | POA: Insufficient documentation

## 2020-03-22 DIAGNOSIS — Z20822 Contact with and (suspected) exposure to covid-19: Secondary | ICD-10-CM | POA: Insufficient documentation

## 2020-03-22 LAB — CBC WITH DIFFERENTIAL/PLATELET
Abs Immature Granulocytes: 0.04 10*3/uL (ref 0.00–0.07)
Basophils Absolute: 0 10*3/uL (ref 0.0–0.1)
Basophils Relative: 0 %
Eosinophils Absolute: 0 10*3/uL (ref 0.0–0.5)
Eosinophils Relative: 0 %
HCT: 38.4 % — ABNORMAL LOW (ref 39.0–52.0)
Hemoglobin: 12.9 g/dL — ABNORMAL LOW (ref 13.0–17.0)
Immature Granulocytes: 0 %
Lymphocytes Relative: 13 %
Lymphs Abs: 1.4 10*3/uL (ref 0.7–4.0)
MCH: 32.1 pg (ref 26.0–34.0)
MCHC: 33.6 g/dL (ref 30.0–36.0)
MCV: 95.5 fL (ref 80.0–100.0)
Monocytes Absolute: 1.2 10*3/uL — ABNORMAL HIGH (ref 0.1–1.0)
Monocytes Relative: 11 %
Neutro Abs: 8.4 10*3/uL — ABNORMAL HIGH (ref 1.7–7.7)
Neutrophils Relative %: 76 %
Platelets: 315 10*3/uL (ref 150–400)
RBC: 4.02 MIL/uL — ABNORMAL LOW (ref 4.22–5.81)
RDW: 13.3 % (ref 11.5–15.5)
WBC: 11.1 10*3/uL — ABNORMAL HIGH (ref 4.0–10.5)
nRBC: 0 % (ref 0.0–0.2)

## 2020-03-22 LAB — COMPREHENSIVE METABOLIC PANEL
ALT: 35 U/L (ref 0–44)
AST: 68 U/L — ABNORMAL HIGH (ref 15–41)
Albumin: 4.3 g/dL (ref 3.5–5.0)
Alkaline Phosphatase: 45 U/L (ref 38–126)
Anion gap: 14 (ref 5–15)
BUN: 57 mg/dL — ABNORMAL HIGH (ref 8–23)
CO2: 23 mmol/L (ref 22–32)
Calcium: 9.6 mg/dL (ref 8.9–10.3)
Chloride: 98 mmol/L (ref 98–111)
Creatinine, Ser: 1.26 mg/dL — ABNORMAL HIGH (ref 0.61–1.24)
GFR, Estimated: >60 mL/min (ref >60)
Glucose, Bld: 106 mg/dL — ABNORMAL HIGH (ref 70–99)
Potassium: 3.7 mmol/L (ref 3.5–5.1)
Sodium: 135 mmol/L (ref 135–145)
Total Bilirubin: 2 mg/dL — ABNORMAL HIGH (ref 0.3–1.2)
Total Protein: 7.3 g/dL (ref 6.5–8.1)

## 2020-03-22 LAB — LIPASE, BLOOD: Lipase: 28 U/L (ref 11–51)

## 2020-03-22 MED ORDER — SODIUM CHLORIDE 0.9 % IV BOLUS
1000.0000 mL | Freq: Once | INTRAVENOUS | Status: AC
  Administered 2020-03-22: 1000 mL via INTRAVENOUS

## 2020-03-22 MED ORDER — IOHEXOL 300 MG/ML  SOLN
100.0000 mL | Freq: Once | INTRAMUSCULAR | Status: AC | PRN
  Administered 2020-03-22: 100 mL via INTRAVENOUS

## 2020-03-22 MED ORDER — LORAZEPAM 2 MG/ML IJ SOLN
1.0000 mg | Freq: Once | INTRAMUSCULAR | Status: AC
  Administered 2020-03-22: 1 mg via INTRAVENOUS
  Filled 2020-03-22: qty 1

## 2020-03-22 NOTE — ED Notes (Signed)
Pt given discharge papers and escorted out by security. Did not sign.

## 2020-03-22 NOTE — ED Triage Notes (Signed)
Patient states he has body pain and also his stomach hurts, reports not eating anything since being discharged early this morning.

## 2020-03-22 NOTE — ED Notes (Signed)
Patient ambulatory to treatment room from triage with standby assistance. Patient refused wheelchair.

## 2020-03-22 NOTE — ED Notes (Signed)
Attempted to assist patient to change into gown without success. Patient provided with gown and warm blankets and made aware of importance of gown for assessment.

## 2020-03-22 NOTE — ED Notes (Signed)
Patient stated he was not comfortable with wearing wires RN stated he came here to the ER to get better Patient complied

## 2020-03-22 NOTE — BH Assessment (Signed)
Attempted TTS assessment 8:17pm. Pts nurse stated that pt was in a highly agitated state earlier and is now sleeping peacefully--requested a call back in 60+ minutes.  Jeanmarie Plant, MSW, LCSW Outpatient Therapist/Triage Specialist

## 2020-03-22 NOTE — ED Provider Notes (Signed)
[place] DEPT Provider Note   CSN: [id number] Arrival date & time: 03/22/20  1245     History Chief Complaint  Patient presents with   Abdominal Pain    Andrew Chaney is a 65 y.o. male.  He was evaluated here in the ED last night.  The history is provided by the patient. The history is limited by the condition of the patient (Seems to have difficulty conveying history possibly due to mental illness.).  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    Renal disorder     Patient Active Problem List   Diagnosis Date Noted   Dupuytren's contracture 08/05/2019   MSSA bacteremia 07/31/2019   Altered mental status    Fever 07/30/2019   Non-purulent Cellulitis of right upper arm 07/30/2019   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[place]) 07/29/2019   Seizure ([place]) 07/26/2019   Status epilepticus ([place]) 07/26/2019   Psychosis ([place]) 09/05/2018   Bipolar disorder, most recent episode manic ([place]) 04/11/2018   Bipolar I disorder, current or most recent episode manic, with psychotic features ([place]) 04/08/2018   Confusion 01/03/2015   Chronic hepatitis C ([place]) 12/20/2012   Renal failure 12/17/2012   Acute encephalopathy 12/17/2012   ARF (acute renal failure) ([place]) 12/17/2012   Hyponatremia 12/17/2012   CAP (community acquired pneumonia) 12/17/2012   Rhabdomyolysis 12/17/2012   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/17/2012    Past Surgical History:  Procedure  Laterality Date   TEE WITHOUT CARDIOVERSION N/A 08/03/2019   Procedure: TRANSESOPHAGEAL ECHOCARDIOGRAM (TEE);  Surgeon: Pixie Casino, MD;  Location: [facility] ENDOSCOPY;  Service: Cardiovascular;  Laterality: N/A;       Family History  Problem Relation Age of Onset   Dementia Mother     Social History   Tobacco Use   Smoking status: Current Every Day Smoker    Packs/day: 0.50    Types: Cigarettes   Smokeless tobacco: Never Used  Vaping Use   Vaping Use: Never used  Substance Use Topics   Alcohol use: Yes   Drug use: Yes    Types: Marijuana    Comment: States no longer uses marijuana because he is "on probation"    Home Medications Prior to Admission medications   Medication Sig Start Date End Date Taking? Authorizing Provider  furosemide (LASIX) 20 MG tablet Take 1 tablet (20 mg total) by mouth daily for 5 days. 09/15/19 09/20/19  Tedd Sias, PA  levETIRAcetam (KEPPRA) 500 MG tablet Take 1 tablet (500 mg total) by mouth 2 (two) times daily. 08/07/19 09/06/19  Andrew Au, MD  paliperidone (INVEGA) 6 MG 24 hr tablet Take 1 tablet (6 mg total) by mouth daily. 08/08/19 09/07/19  Andrew Au, MD  phenytoin (DILANTIN) 200 MG ER capsule Take 1 capsule (200 mg total) by mouth 2 (two) times daily. 08/07/19 09/06/19  Andrew Au, MD  ramelteon (ROZEREM) 8 MG tablet Take 1 tablet (8 mg total) by mouth at bedtime. 08/07/19 09/06/19  Edison Simon  Y, MD    Allergies    Patient has no known allergies.  Review of Systems   Review of Systems  Constitutional: Negative for chills and fever.  HENT: Negative for ear pain and sore throat.   Eyes: Negative for pain and visual disturbance.  Respiratory: Negative for cough and shortness of breath.   Cardiovascular: Negative for chest pain and palpitations.  Gastrointestinal: Positive for abdominal pain and diarrhea. Negative for vomiting.  Genitourinary: Negative for dysuria and hematuria.  Musculoskeletal: Negative for arthralgias  and back pain.  Skin: Negative for color change and rash.  Neurological: Negative for seizures, syncope and headaches.  All other systems reviewed and are negative.   Physical Exam Updated Vital Signs BP 137/66    Pulse 83    Temp 97.8 F (36.6 C)    Resp (!) 21    Ht 6' (1.829 m)    Wt 71 kg    SpO2 95%    BMI 21.23 kg/m   Physical Exam Vitals and nursing note reviewed.  Constitutional:      Appearance: He is well-developed.  HENT:     Head: Normocephalic and atraumatic.  Eyes:     Conjunctiva/sclera: Conjunctivae normal.  Pulmonary:     Effort: Pulmonary effort is normal. No respiratory distress.  Abdominal:     General: There is no distension.     Palpations: Abdomen is soft.     Tenderness: There is no abdominal tenderness.  Musculoskeletal:     Cervical back: Neck supple.     Right lower leg: Swelling present. 2+ Pitting Edema present.     Left lower leg: Swelling present. 2+ Pitting Edema present.  Skin:    General: Skin is warm and dry.     Comments: Skin of feet is erythematous without overt cellulitis.  No evidence of frostbite or other environmental trauma.  Neurological:     General: No focal deficit present.     Mental Status: He is alert.  Psychiatric:     Comments: Labile, mildly uncooperative     ED Results / Procedures / Treatments   Labs (all labs ordered are listed, but only abnormal results are displayed) Labs Reviewed  COMPREHENSIVE METABOLIC PANEL - Abnormal; Notable for the following components:      Result Value   Glucose, Bld 106 (*)    BUN 57 (*)    Creatinine, Ser 1.26 (*)    AST 68 (*)    Total Bilirubin 2.0 (*)    All other components within normal limits  CBC WITH DIFFERENTIAL/PLATELET - Abnormal; Notable for the following components:   WBC 11.1 (*)    RBC 4.02 (*)    Hemoglobin 12.9 (*)    HCT 38.4 (*)    Neutro Abs 8.4 (*)    Monocytes Absolute 1.2 (*)    All other components within normal limits  CBC WITH  DIFFERENTIAL/PLATELET - Abnormal; Notable for the following components:   RBC 3.34 (*)    Hemoglobin 10.7 (*)    HCT 32.4 (*)    All other components within normal limits  COMPREHENSIVE METABOLIC PANEL - Abnormal; Notable for the following components:   Glucose, Bld 160 (*)    BUN 29 (*)    Calcium 8.5 (*)    Total Protein 5.3 (*)    Albumin 2.9 (*)    AST 45 (*)    Alkaline Phosphatase 33 (*)    Total Bilirubin 1.3 (*)    All other components within normal  limits  RESP PANEL BY RT-PCR (FLU A&B, COVID) ARPGX2  LIPASE, BLOOD  ETHANOL    EKG None  Radiology DG Chest 2 View  Result Date: 03/22/2020 CLINICAL DATA:  Elevated white blood cell count, abdominal pain EXAM: CHEST - 2 VIEW COMPARISON:  07/30/2019 FINDINGS: Frontal and lateral views of the chest demonstrate an unremarkable cardiac silhouette. Scattered areas of subsegmental atelectasis or scarring at the lung bases. No airspace disease, effusion, or pneumothorax. No acute bony abnormality. IMPRESSION: 1. No acute intrathoracic process. Electronically Signed   By: Randa Ngo M.D.   On: 03/22/2020 18:43   CT Abdomen Pelvis W Contrast  Result Date: 03/22/2020 CLINICAL DATA:  Abdominal pain EXAM: CT ABDOMEN AND PELVIS WITH CONTRAST TECHNIQUE: Multidetector CT imaging of the abdomen and pelvis was performed using the standard protocol following bolus administration of intravenous contrast. CONTRAST:  1101mL OMNIPAQUE IOHEXOL 300 MG/ML  SOLN COMPARISON:  09/15/2019 FINDINGS: Lower chest: Bibasilar subsegmental atelectasis. Heart size within normal limits. Hepatobiliary: No focal liver abnormality is seen. No gallstones, gallbladder wall thickening, or biliary dilatation. Pancreas: 7 mm hypoattenuating lesion within the pancreatic head is not appreciably changed compared to prior CT. No pancreatic ductal dilatation or peripancreatic inflammatory changes. Spleen: Normal in size without focal abnormality. Adrenals/Urinary Tract:  Unremarkable adrenal glands. Kidneys enhance symmetrically without focal lesion, stone, or hydronephrosis. Ureters are nondilated. Urinary bladder appears unremarkable. Stomach/Bowel: Stomach is within normal limits. Scattered colonic diverticulosis. No evidence of bowel wall thickening, distention, or inflammatory changes. 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 Findings appear unchanged from dedicated CT venogram dated 09/15/2019. No aortic aneurysm. No acute vascular abnormality identified. No abdominopelvic lymphadenopathy. Reproductive: Prostate is unremarkable. Other: No free fluid. No abdominopelvic fluid collection. No pneumoperitoneum. No abdominal wall hernia. Musculoskeletal: No acute osseous abnormality. Unchanged probable intraosseous hemangioma within the L4 vertebral body. No suspicious bone lesion. IMPRESSION: 1. No acute abdominopelvic findings. 2. Colonic diverticulosis without evidence of acute diverticulitis. 3. 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 Findings appear unchanged from dedicated CT venogram dated 09/15/2019. 4. Grossly stable subcentimeter hypoattenuating lesion within the pancreatic head. Follow-up with nonemergent pancreatic protocol MRI/MRCP. Electronically Signed   By: Davina Poke D.O.   On: 03/22/2020 18:54    Procedures Procedures   Medications Ordered in ED Medications - No data to display  ED Course  I have reviewed the triage vital signs and the nursing notes.  Pertinent labs & imaging results that were available during my care of the patient were reviewed by me and considered in my medical decision making (see chart for details).    MDM Rules/Calculators/A&P                          Patient is 65 years old with a history  of bipolar disorder.  He also suffers from housing instability.  He came into the ED complaining of diarrhea.  History was challenging secondary to his underlying mental health condition.  The patient was evaluated for evidence of acute abnormalities causing his condition including electrolyte abnormalities, biliary pathology, and intra-abdominal pathology.  He was found to be slightly dehydrated and was given IV fluids.  TTS was consulted secondary to his repeat ED visits, his underlying mental illness, and his social situation.  He was deemed suitable for discharge home. Final  Clinical Impression(s) / ED Diagnoses Final diagnoses:  Diarrhea, unspecified type  Acute kidney injury [facility])  Housing instability    Rx / DC Orders ED Discharge Orders         Ordered    hydrOXYzine (ATARAX/VISTARIL) 25 MG tablet  At bedtime PRN,   Status:  Discontinued        03/23/20 1237    Increase activity slowly        03/23/20 1237    Diet - low sodium heart healthy        03/23/20 1237    Discharge instructions       Comments: Follow up with [facility]   03/23/20 1237    Increase activity slowly        03/23/20 1239    hydrOXYzine (ATARAX/VISTARIL) 25 MG tablet  At bedtime PRN        03/23/20 1243           Arnaldo Natal, MD 03/23/20 1312

## 2020-03-22 NOTE — ED Notes (Signed)
Patient off floor for CT scan.

## 2020-03-23 ENCOUNTER — Other Ambulatory Visit: Payer: Self-pay | Admitting: Psychiatry

## 2020-03-23 DIAGNOSIS — G4700 Insomnia, unspecified: Secondary | ICD-10-CM | POA: Diagnosis present

## 2020-03-23 DIAGNOSIS — F4329 Adjustment disorder with other symptoms: Secondary | ICD-10-CM

## 2020-03-23 LAB — CBC WITH DIFFERENTIAL/PLATELET
Abs Immature Granulocytes: 0.02 10*3/uL (ref 0.00–0.07)
Basophils Absolute: 0 10*3/uL (ref 0.0–0.1)
Basophils Relative: 0 %
Eosinophils Absolute: 0 10*3/uL (ref 0.0–0.5)
Eosinophils Relative: 1 %
HCT: 32.4 % — ABNORMAL LOW (ref 39.0–52.0)
Hemoglobin: 10.7 g/dL — ABNORMAL LOW (ref 13.0–17.0)
Immature Granulocytes: 0 %
Lymphocytes Relative: 16 %
Lymphs Abs: 0.8 10*3/uL (ref 0.7–4.0)
MCH: 32 pg (ref 26.0–34.0)
MCHC: 33 g/dL (ref 30.0–36.0)
MCV: 97 fL (ref 80.0–100.0)
Monocytes Absolute: 0.7 10*3/uL (ref 0.1–1.0)
Monocytes Relative: 13 %
Neutro Abs: 3.8 10*3/uL (ref 1.7–7.7)
Neutrophils Relative %: 70 %
Platelets: 265 10*3/uL (ref 150–400)
RBC: 3.34 MIL/uL — ABNORMAL LOW (ref 4.22–5.81)
RDW: 13.4 % (ref 11.5–15.5)
WBC: 5.4 10*3/uL (ref 4.0–10.5)
nRBC: 0 % (ref 0.0–0.2)

## 2020-03-23 LAB — COMPREHENSIVE METABOLIC PANEL
ALT: 27 U/L (ref 0–44)
AST: 45 U/L — ABNORMAL HIGH (ref 15–41)
Albumin: 2.9 g/dL — ABNORMAL LOW (ref 3.5–5.0)
Alkaline Phosphatase: 33 U/L — ABNORMAL LOW (ref 38–126)
Anion gap: 11 (ref 5–15)
BUN: 29 mg/dL — ABNORMAL HIGH (ref 8–23)
CO2: 22 mmol/L (ref 22–32)
Calcium: 8.5 mg/dL — ABNORMAL LOW (ref 8.9–10.3)
Chloride: 105 mmol/L (ref 98–111)
Creatinine, Ser: 0.84 mg/dL (ref 0.61–1.24)
GFR, Estimated: >60 mL/min (ref >60)
Glucose, Bld: 160 mg/dL — ABNORMAL HIGH (ref 70–99)
Potassium: 3.8 mmol/L (ref 3.5–5.1)
Sodium: 138 mmol/L (ref 135–145)
Total Bilirubin: 1.3 mg/dL — ABNORMAL HIGH (ref 0.3–1.2)
Total Protein: 5.3 g/dL — ABNORMAL LOW (ref 6.5–8.1)

## 2020-03-23 LAB — ETHANOL: Alcohol, Ethyl (B): <10 mg/dL (ref <10)

## 2020-03-23 LAB — RESP PANEL BY RT-PCR (FLU A&B, COVID) ARPGX2
Influenza A by PCR: NEGATIVE
Influenza B by PCR: NEGATIVE
SARS Coronavirus 2 by RT PCR: NEGATIVE

## 2020-03-23 MED ORDER — ALUM & MAG HYDROXIDE-SIMETH 200-200-20 MG/5ML PO SUSP: 30.0000 mL | Freq: Four times a day (QID) | ORAL | Status: DC | PRN

## 2020-03-23 MED ORDER — HYDROXYZINE HCL 25 MG PO TABS
25.0000 mg | ORAL_TABLET | Freq: Every evening | ORAL | 1 refills | Status: DC | PRN
Start: 2020-03-23 — End: 2020-05-10

## 2020-03-23 MED ORDER — ONDANSETRON HCL 4 MG PO TABS: 4.0000 mg | ORAL_TABLET | Freq: Three times a day (TID) | ORAL | Status: DC | PRN

## 2020-03-23 MED ORDER — HYDROXYZINE HCL 25 MG PO TABS: 25.0000 mg | ORAL_TABLET | Freq: Every evening | ORAL | Status: DC | PRN

## 2020-03-23 MED ORDER — NICOTINE 14 MG/24HR TD PT24: 14.0000 mg | MEDICATED_PATCH | Freq: Every day | TRANSDERMAL | Status: DC

## 2020-03-23 MED ORDER — HYDROXYZINE HCL 25 MG PO TABS: 25.0000 mg | ORAL_TABLET | Freq: Every evening | ORAL | 0 refills | Status: DC | PRN

## 2020-03-23 MED ORDER — ACETAMINOPHEN 325 MG PO TABS: 650.0000 mg | ORAL_TABLET | ORAL | Status: DC | PRN

## 2020-03-23 NOTE — ED Notes (Signed)
TTS cart placed in room  

## 2020-03-23 NOTE — ED Notes (Signed)
Pt's RN provided him with a pair of cloth scrub pants and offered to help him get dressed, pt stated that he could dress himself. Pt put on paper scrub pants and said that he needed to use the restroom first. He ambulated independently to the restroom and back to his room, stating that he would be getting dressed at this time. When this writer entered pt's room to offer assistance, pt was putting on his shoes, however when he saw this writer come into the room, he insisted that his feet "are too swollen" to put into his shoes, despite successfully putting on one of his own shoes. This Probation officer offered assistance and requested to collect vital signs for discharge, however pt refused both. This Probation officer is working with Therapist, sports and security to find a solution at this time.

## 2020-03-23 NOTE — ED Notes (Signed)
Breakfast tray provided. 

## 2020-03-23 NOTE — BH Assessment (Signed)
Lillia Mountain, RN, notified through secure chat that per Waylan Boga, NP, patient is psychiatrically cleared.

## 2020-03-23 NOTE — ED Provider Notes (Signed)
Emergency Medicine Observation Re-evaluation Note  Aaren Atallah is a 65 y.o. male, seen on rounds today.  Pt initially presented to the ED for complaints of Abdominal Pain Currently, the patient is awaiting TTS evaluation/disposition.  Physical Exam  BP 94/64   Pulse 69   Temp 97.8 F (36.6 C)   Resp 17   Ht 6' (1.829 m)   Wt 71 kg   SpO2 97%   BMI 21.23 kg/m  Physical Exam General: Calm. Resting.  Cardiac: Well perfused.  Lungs: Even, unlabored respirations.  Psych: Calm and cooperative.   ED Course / MDM  EKG:    I have reviewed the labs performed to date as well as medications administered while in observation.  Recent changes in the last 24 hours include TTS evaluation.  Plan  Current plan is for discharge from the ED with SW resources after ED evaluation. Discharge completed by TTS.   Margette Fast, MD 03/23/20 [phone]

## 2020-03-23 NOTE — Consult Note (Signed)
[facility] Psych ED Discharge  03/23/2020 12:33 PM Andrew Chaney  MRN:  [id number] Principal Problem: Adjustment disorder with disturbance of emotion Discharge Diagnoses: Principal Problem:   Adjustment disorder with disturbance of emotion   Subjective: "I'm doing good."  03/23/20: Patient seen and evaluated by this provider in person. Upon presentation patient appears to be resting comfortably in his bed and appropriately answering questions. Patient reports having difficulty concentrating as well as poor sleep.  Patient denies suicidal ideations, homicidal ideations, auditory and visual hallucinations. Patient reports poor sleep and would like help with his sleep. Patient stated "I don't have a way to pay for any medications though." Patient agreeable to a social worker consult to assist with medications. Patient agreeable to starting hydroxyzine 25 mg as needed at bedtime for sleep. Caveat:  Client admitted to the ED for medical issues not psychiatric ones.  03/23/20 Per [facility] Assessment: Andrew Chaney is a 65 year old male presenting to [facility] voluntarily with chief complaint of abdominal pain. Patient was seen at [facility] on 03/21/2020 with concerns of homelessness and not being able to get into a shelter. Today patient reports ED visit is because he is having a "difficult time". Patient reports his leg is swollen he has pain in his feet and stomach and shortness of breath. Patient informed that this is a crisis assessment and was asked if he was having a mental health crisis. Patient reports that he is having a mental health crisis and reports that he has difficulty understanding people when they talk, he has trouble holding a conversation with people and having difficulty sleeping. Patient denies SI, HI, AVH and SIB. Patient reports smoking marijuana weekly and drinking 2-16oz cans of beer about 4 days a week. Patient reports last time drinking was three days ago. Patient reports psychiatric history of being  diagnosed with bipolar disorder in his late 20's and was hospitalized at [facility] "a long time ago". Patient does not have any outpatient services currently. Patient reports being homeless for many years and is currently homeless, unsheltered. Patient is not employed and is not receiving any disability benefits. Patient reports several pending charges related to criminal trespassing.    Patient is oriented to person and place. Patient is alert, engaged and cooperative. Patient eye contact and tone of voice is normal. Patient presents with some difficulty understanding during conversation but can render appropriate answers. Patient has full range of affect; his mood is calm and thoughts appropriate to mood and circumstance. Patient denies SI/HI/AVH and SIB.  The patient demonstrates the following risk factors for suicide: Chronic risk factors for suicide include: psychiatric disorder of Bipolar, substance use disorder and demographic factors (male, >57 y/o). Acute risk factors for suicide include: unemployment and homeless. Protective factors for this patient include: hope for the future. Considering these factors, the overall suicide risk at this point appears to be low. Patient is appropriate for outpatient follow up.  Total Time spent with patient: 30 minutes  Past Psychiatric History: See Below  Past Medical History:  Past Medical History:  Diagnosis Date  . Bipolar 1 disorder ([place])   . Cancer [facility]) 2011   Germ Cell Seminoma   . Renal disorder     Past Surgical History:  Procedure Laterality Date  . TEE WITHOUT CARDIOVERSION N/A 08/03/2019   Procedure: TRANSESOPHAGEAL ECHOCARDIOGRAM (TEE);  Surgeon: Pixie Casino, MD;  Location[facility] ENDOSCOPY;  Service: Cardiovascular;  Laterality: N/A;   Family History:  Family History  Problem Relation Age of Onset  . Dementia  Mother    Family Psychiatric  History: See Above Social History:  Social History   Substance and Sexual Activity   Alcohol Use Yes     Social History   Substance and Sexual Activity  Drug Use Yes  . Types: Marijuana   Comment: States no longer uses marijuana because he is "on probation"    Social History   Socioeconomic History  . Marital status: Single    Spouse name: Not on file  . Number of children: Not on file  . Years of education: Not on file  . Highest education level: Not on file  Occupational History  . Not on file  Tobacco Use  . Smoking status: Current Every Day Smoker    Packs/day: 0.50    Types: Cigarettes  . Smokeless tobacco: Never Used  Vaping Use  . Vaping Use: Never used  Substance and Sexual Activity  . Alcohol use: Yes  . Drug use: Yes    Types: Marijuana    Comment: States no longer uses marijuana because he is "on probation"  . Sexual activity: Not Currently  Other Topics Concern  . Not on file  Social History Narrative  . Not on file   Social Determinants of Health   Financial Resource Strain: Not on file  Food Insecurity: Not on file  Transportation Needs: Not on file  Physical Activity: Not on file  Stress: Not on file  Social Connections: Not on file    Has this patient used any form of tobacco in the last 30 days? (Cigarettes, Smokeless Tobacco, Cigars, and/or Pipes) A prescription for an FDA-approved tobacco cessation medication was offered at discharge and the patient refused  Current Medications: Current Facility-Administered Medications  Medication Dose Route Frequency Provider Last Rate Last Admin  . acetaminophen (TYLENOL) tablet 650 mg  650 mg Oral Z6X PRN Delora Fuel, MD      . alum & mag hydroxide-simeth (MAALOX/MYLANTA) 200-200-20 MG/5ML suspension 30 mL  30 mL Oral W9U PRN Delora Fuel, MD      . nicotine (NICODERM CQ - dosed in mg/24 hours) patch 14 mg  14 mg Transdermal Daily Delora Fuel, MD      . ondansetron [facility]) tablet 4 mg  4 mg Oral E4V PRN Delora Fuel, MD       Current Outpatient Medications  Medication Sig Dispense  Refill  . levETIRAcetam (KEPPRA) 500 MG tablet Take 1 tablet (500 mg total) by mouth 2 (two) times daily. (Patient not taking: Reported on 03/23/2020) 60 tablet 0  . paliperidone (INVEGA) 6 MG 24 hr tablet Take 1 tablet (6 mg total) by mouth daily. (Patient not taking: Reported on 03/23/2020) 30 tablet 0  . phenytoin (DILANTIN) 200 MG ER capsule Take 1 capsule (200 mg total) by mouth 2 (two) times daily. (Patient not taking: Reported on 03/23/2020) 60 capsule 0  . ramelteon (ROZEREM) 8 MG tablet Take 1 tablet (8 mg total) by mouth at bedtime. (Patient not taking: Reported on 03/23/2020) 30 tablet 0   PTA Medications: (Not in a hospital admission)   Musculoskeletal: Strength & Muscle Tone: within normal limits Gait & Station: UTA Patient leans: N/A  Psychiatric Specialty Exam:  Presentation  General Appearance: No data recorded Eye Contact:No data recorded Speech:No data recorded Speech Volume:No data recorded Handedness:No data recorded  Mood and Affect  Mood:No data recorded Affect:No data recorded  Thought Process  Thought Processes:No data recorded Descriptions of Associations:No data recorded Orientation:No data recorded Thought Content:No data recorded History of  Schizophrenia/Schizoaffective disorder:No  Duration of Psychotic Symptoms:No data recorded Hallucinations:No data recorded Ideas of Reference:No data recorded Suicidal Thoughts:No data recorded Homicidal Thoughts:No data recorded  Sensorium  Memory:No data recorded Judgment:No data recorded Insight:No data recorded  Executive Functions  Concentration:No data recorded Attention Span:No data recorded Recall:No data recorded [employer] recorded Language:No data recorded  Psychomotor Activity  Psychomotor Activity:No data recorded  Assets  Assets:No data recorded  Sleep  Sleep:No data recorded   Physical Exam: Physical Exam Vitals and nursing note reviewed.  HENT:     Head:  Normocephalic.  Pulmonary:     Effort: Pulmonary effort is normal.  Neurological:     Mental Status: He is alert.  Psychiatric:        Attention and Perception: Attention and perception normal.        Mood and Affect: Mood and affect normal.        Speech: Speech normal.        Behavior: Behavior normal. Behavior is cooperative.        Thought Content: Thought content normal. Thought content does not include homicidal or suicidal ideation.        Cognition and Memory: Cognition normal.        Judgment: Judgment normal.    ROS Blood pressure (!) 111/55, pulse 79, temperature 98 F (36.7 C), resp. rate 20, height 6' (1.829 m), weight 71 kg, SpO2 92 %. Body mass index is 21.23 kg/m.   Demographic Factors:  Male, Low socioeconomic status and Unemployed  Loss Factors: Financial problems/change in socioeconomic status  Historical Factors: NA  Risk Reduction Factors:   Positive coping skills or problem solving skills  Continued Clinical Symptoms:  None  Cognitive Features That Contribute To Risk:  None    Suicide Risk:  Minimal: No identifiable suicidal ideation.  Patients presenting with no risk factors but with morbid ruminations; may be classified as minimal risk based on the severity of the depressive symptoms   Follow-up [facility] Follow up.   Specialty: Behavioral Health Why: Come Monday-Thursday from 8-11am for open access.  Contact information: [place] [phone]              Plan Of Care/Follow-up recommendations:  Activity:  As Tolerated Diet:  Heart Healthy  Disposition:  Adjustment disorder with disturbance of emotion -Resources provided for homeless shelter -Resources provided for the [facility]  Insomnia: -Started Hydroxyzine 25 mg at bedtime PRN Waylan Boga, NP 03/23/2020, 12:33 PM

## 2020-03-23 NOTE — ED Notes (Signed)
This writer went into patient's room to help him get dressed and assist him out of the department. Pt was seated on the bed, holding his jeans which are completely soiled with stool up to his face, stating, "Do these pants smell like poop to you? I can't tell at all." This writer told the pt that yes, the pants were very soiled and that this writer was there if he needed any help getting himself dressed and would put the jeans in a patient belongings bag. Pt's RN had provided him with a pair of paper scrub pants, however pt stated that he would not be using them, and that "I'll freeze to death if I wear these." This writer agreed to check clothing closet to see if there were any pants that could fit pt, however there were none available. At this time, pt is currently refusing paper scrub pants and is only wearing a shirt. This Probation officer will check with pt's RN to figure out a solution.

## 2020-03-23 NOTE — BH Assessment (Signed)
Comprehensive Clinical Assessment (CCA) Note  03/23/2020 Andrew Chaney [id number]  Disposition: Per Waylan Boga, NP, patient is psychiatrically cleared.   Andrew Chaney is a 65 year old male presenting to [facility] voluntarily with chief complaint of abdominal pain. Patient was seen at [facility] on 03/21/2020 with concerns of homelessness and not being able to get into a shelter. Today patient reports ED visit is because he is having a "difficult time". Patient reports his leg is swollen he has pain in his feet and stomach and shortness of breath. Patient informed that this is a crisis assessment and was asked if he was having a mental health crisis. Patient reports that he is having a mental health crisis and reports that he has difficulty understanding people when they talk, he has trouble holding a conversation with people and having difficulty sleeping. Patient denies SI, HI, AVH and SIB. Patient reports smoking marijuana weekly and drinking 2-16oz cans of beer about 4 days a week. Patient reports last time drinking was three days ago. Patient reports psychiatric history of being diagnosed with bipolar disorder in his late 20's and was hospitalized at [facility] "a long time ago". Patient does not have any outpatient services currently. Patient reports being homeless for many years and is currently homeless, unsheltered. Patient is not employed and is not receiving any disability benefits. Patient reports several pending charges related to criminal trespassing.    Patient is oriented to person and place. Patient is alert, engaged and cooperative. Patient eye contact and tone of voice is normal. Patient presents with some difficulty understanding during conversation but can render appropriate answers. Patient has full range of affect; his mood is calm and thoughts appropriate to mood and circumstance. Patient denies SI/HI/AVH and SIB.  The patient demonstrates the following risk factors for suicide: Chronic risk  factors for suicide include: psychiatric disorder of Bipolar, substance use disorder and demographic factors (male, >56 y/o). Acute risk factors for suicide include: unemployment and homeless. Protective factors for this patient include: hope for the future. Considering these factors, the overall suicide risk at this point appears to be low. Patient is appropriate for outpatient follow up.  [place] ED from 03/22/2020 in [place] DEPT ED from 03/21/2020 in [place] DEPT ED from 09/12/2019 in [place] No Risk No Risk Error: Question 2 not populated      Chief Complaint:  Chief Complaint  Patient presents with  . Abdominal Pain   Visit Diagnosis: Adjustment disorder with disturbance of emotion    CCA Screening, Triage and Referral (STR)  Patient Reported Information How did you hear about [place]? No data recorded Referral name: No data recorded Referral phone number: No data recorded  Whom do you see for routine medical problems? No data recorded Practice/Facility Name: No data recorded Practice/Facility Phone Number: No data recorded Name of Contact: No data recorded Contact Number: No data recorded Contact Fax Number: No data recorded Prescriber Name: No data recorded Prescriber Address (if known): No data recorded  What Is the Reason for Your Visit/Call Today? No data recorded How Long Has This Been Causing You Problems? No data recorded What Do You Feel Would Help You the Most Today? No data recorded  Have You Recently Been in Any Inpatient Treatment (Hospital/Detox/Crisis Center/28-Day Program)? No data recorded Name/Location of Program/Hospital:No data recorded How Long Were You There? No data recorded When Were You Discharged? No data recorded  Have You Ever Received Services  From [facility] Before? No data recorded Who Do You See at [facility]?  No data recorded  Have You Recently Had Any Thoughts About Hurting Yourself? No data recorded Are You Planning to Commit Suicide/Harm Yourself At This time? No data recorded  Have you Recently Had Thoughts About [place]? No data recorded Explanation: No data recorded  Have You Used Any Alcohol or Drugs in the Past 24 Hours? No data recorded How Long Ago Did You Use Drugs or Alcohol? No data recorded What Did You Use and How Much? No data recorded  Do You Currently Have a Therapist/Psychiatrist? No data recorded Name of Therapist/Psychiatrist: No data recorded  Have You Been Recently Discharged From Any Office Practice or Programs? No data recorded Explanation of Discharge From Practice/Program: No data recorded    CCA Screening Triage Referral Assessment Type of Contact: No data recorded Is this Initial or Reassessment? No data recorded Date Telepsych consult ordered in CHL:  No data recorded Time Telepsych consult ordered in CHL:  No data recorded  Patient Reported Information Reviewed? No data recorded Patient Left Without Being Seen? No data recorded Reason for Not Completing Assessment: No data recorded  Collateral Involvement: No data recorded  Does Patient Have a [place]? No data recorded Name and Contact of Legal Guardian: No data recorded If Minor and Not Living with Parent(s), Who has Custody? No data recorded Is CPS involved or ever been involved? No data recorded Is APS involved or ever been involved? No data recorded  Patient Determined To Be At Risk for Harm To Self or Others Based on Review of Patient Reported Information or Presenting Complaint? No data recorded Method: No data recorded Availability of Means: No data recorded Intent: No data recorded Notification Required: No data recorded Additional Information for Danger to Others Potential: No data recorded Additional Comments for Danger to Others Potential: No data  recorded Are There Guns or Other Weapons in Your Home? No data recorded Types of Guns/Weapons: No data recorded Are These Weapons Safely Secured?                            No data recorded Who Could Verify You Are Able To Have These Secured: No data recorded Do You Have any Outstanding Charges, Pending Court Dates, Parole/Probation? No data recorded Contacted To Inform of Risk of Harm To Self or Others: No data recorded  Location of Assessment: No data recorded  Does Patient Present under Involuntary Commitment? No data recorded IVC Papers Initial File Date: No data recorded  [place] of Residence: No data recorded  Patient Currently Receiving the Following Services: No data recorded  Determination of Need: No data recorded  Options For Referral: No data recorded    CCA Biopsychosocial Intake/Chief Complaint:  Abdominal pain  Current Symptoms/Problems: pain in body, diffiuclty understanding, holding a conversation and sleeping.   Patient Reported Schizophrenia/Schizoaffective Diagnosis in Past: No   Strengths: UTA  Preferences: UTA  Abilities: UTA   Type of Services Patient Feels are Needed: Inpatient   Initial Clinical Notes/Concerns: No data recorded  Mental Health Symptoms Depression:  Sleep (too much or little); Irritability   Duration of Depressive symptoms: Greater than two weeks   Mania:  N/A   Anxiety:   Worrying   Psychosis:  None   Duration of Psychotic symptoms: No data recorded  Trauma:  None   Obsessions:  None   Compulsions:  None  Inattention:  None   Hyperactivity/Impulsivity:  N/A   Oppositional/Defiant Behaviors:  None   Emotional Irregularity:  None   Other Mood/Personality Symptoms:  No data recorded   Mental Status Exam Appearance and self-care  Stature:  Average   Weight:  Average weight   Clothing:  No data recorded  Grooming:  Normal   Cosmetic use:  None   Posture/gait:  Normal   Motor activity:  Not  Remarkable   Sensorium  Attention:  Normal   Concentration:  Normal   Orientation:  Person; Place   Recall/memory:  Defective in Short-term; Defective in Remote   Affect and Mood  Affect:  Full Range   Mood:  Euthymic   Relating  Eye contact:  Normal   Facial expression:  Responsive   Attitude toward examiner:  Cooperative   Thought and Language  Speech flow: Normal   Thought content:  Appropriate to Mood and Circumstances   Preoccupation:  None   Hallucinations:  None   Organization:  No data recorded  [employer] of Knowledge:  Fair   Intelligence:  Average   Abstraction:  Normal   Judgement:  Fair   Art therapist:  Adequate   Insight:  Fair   Decision Making:  Normal   Social Functioning  Social Maturity:  Responsible   Social Judgement:  "Street Smart"   Stress  Stressors:  Housing; Illness; Financial   Coping Ability:  Overwhelmed; Exhausted; Deficient supports   Skill Deficits:  None   Supports:  Support needed     Religion:    Leisure/Recreation:    Exercise/Diet: Exercise/Diet Do You Have Any Trouble Sleeping?: Yes   CCA Employment/Education Employment/Work Situation: Employment / Work Situation Employment situation: Unemployed What is the longest time patient has a held a job?: A long time  Where was the patient employed at that time?: 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[place], 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  Education:     CCA Family/Childhood History Family and Relationship History: Family history What is your sexual orientation?: straight Does patient have children?: No  Childhood History:  Childhood History By whom was/is the patient raised?: Both parents Description of patient's relationship with caregiver when they were a child: Not good we had a playboy on the table Did patient suffer any verbal/emotional/physical/sexual abuse as a child?: No Has patient ever been sexually abused/assaulted/raped as an  adolescent or adult?: No Witnessed domestic violence?: No Has patient been affected by domestic violence as an adult?: No  Child/Adolescent Assessment:     CCA Substance Use Alcohol/Drug Use: Alcohol / Drug Use Pain Medications: See MAR Prescriptions: see MAR Over the Counter: See MAR History of alcohol / drug use?: Yes Longest period of sobriety (when/how long): UTA Negative Consequences of Use:  (UTA) Withdrawal Symptoms:  (UTA) Substance #1 Name of Substance 1: THC 1 - Age of First Use: 18 1 - Frequency: weekly 1 - Duration: ongoing 1 - Last Use / Amount: 1/22 Substance #2 Name of Substance 2: ETOH 2 - Age of First Use: "as a kid" 2 - Amount (size/oz): 2-16oz 2 - Frequency: 4x week 2 - Duration: ongoing 2 - Last Use / Amount: 3 days ago                     ASAM's:  Six Dimensions of Multidimensional Assessment  Dimension 1:  Acute Intoxication and/or Withdrawal Potential:      Dimension 2:  Biomedical Conditions and Complications:  Dimension 3:  Emotional, Behavioral, or Cognitive Conditions and Complications:     Dimension 4:  Readiness to Change:     Dimension 5:  Relapse, Continued use, or Continued Problem Potential:     Dimension 6:  Recovery/Living Environment:     ASAM Severity Score:    ASAM Recommended Level of Treatment:     Substance use Disorder (SUD)    Recommendations for Services/Supports/Treatments: Recommendations for Services/Supports/Treatments Recommendations For Services/Supports/Treatments: Individual Therapy  DSM5 Diagnoses: Patient Active Problem List   Diagnosis Date Noted  . Dupuytren's contracture 08/05/2019  . MSSA bacteremia 07/31/2019  . Altered mental status   . Fever 07/30/2019  . Non-purulent Cellulitis of right upper arm 07/30/2019  . 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 Seizure ([place]) 07/26/2019  . Status epilepticus ([place]) 07/26/2019  . Psychosis ([place]) 09/05/2018  .  Bipolar disorder, most recent episode manic ([place]) 04/11/2018  . Bipolar I disorder, current or most recent episode manic, with psychotic features ([place]) 04/08/2018  . Confusion 01/03/2015  . Chronic hepatitis C ([place]) 12/20/2012  . Renal failure 12/17/2012  . Acute encephalopathy 12/17/2012  . ARF (acute renal failure) ([place]) 12/17/2012  . Hyponatremia 12/17/2012  . CAP (community acquired pneumonia) 12/17/2012  . Rhabdomyolysis 12/17/2012  . 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   [place], [facility]

## 2020-03-23 NOTE — Discharge Planning (Signed)
RNCM consulted regarding homeless, uninsured pt needing medication assistance.  RNCM advised to have pt go to [facility] for a one time free Rx fill and to register with [facility]) medical team for consistent medical care and Rx needs. EDCM contacted Marliss Coots, NP with [facility] to alert her of pt arrival.

## 2020-03-23 NOTE — ED Notes (Signed)
Patient is stating his pants are soiled. Will provide with paper scrub pants

## 2020-03-23 NOTE — Discharge Instructions (Signed)
Follow up with [facility] for disability assistance (SOAR) and housing resources.  [address] [place], [place] [address], [phone]  [facility]  Mental health clinic in [place], [place] Address: [address] [place], [place] [address] Hours:  Open 24 hours Phone: [phone]

## 2020-03-23 NOTE — ED Notes (Signed)
Patient provided clothes and discharge paperwork and asked to get dressed.

## 2020-03-30 ENCOUNTER — Emergency Department (HOSPITAL_COMMUNITY)
Admission: EM | Admit: 2020-03-30 | Discharge: 2020-03-31 | Disposition: A | Discharge status: Home / Self Care | Payer: Self-pay | Attending: Emergency Medicine | Admitting: Emergency Medicine

## 2020-03-30 ENCOUNTER — Emergency Department (HOSPITAL_COMMUNITY): Payer: Self-pay

## 2020-03-30 DIAGNOSIS — M7989 Other specified soft tissue disorders: Secondary | ICD-10-CM

## 2020-03-30 DIAGNOSIS — F17210 Nicotine dependence, cigarettes, uncomplicated: Secondary | ICD-10-CM | POA: Insufficient documentation

## 2020-03-30 DIAGNOSIS — X58XXXA Exposure to other specified factors, initial encounter: Secondary | ICD-10-CM | POA: Insufficient documentation

## 2020-03-30 DIAGNOSIS — S92002A Unspecified fracture of left calcaneus, initial encounter for closed fracture: Secondary | ICD-10-CM

## 2020-03-30 DIAGNOSIS — Z8547 Personal history of malignant neoplasm of testis: Secondary | ICD-10-CM | POA: Insufficient documentation

## 2020-03-30 LAB — COMPREHENSIVE METABOLIC PANEL
ALT: 22 U/L (ref 0–44)
AST: 22 U/L (ref 15–41)
Albumin: 3.3 g/dL — ABNORMAL LOW (ref 3.5–5.0)
Alkaline Phosphatase: 65 U/L (ref 38–126)
Anion gap: 7 (ref 5–15)
BUN: 15 mg/dL (ref 8–23)
CO2: 28 mmol/L (ref 22–32)
Calcium: 9.6 mg/dL (ref 8.9–10.3)
Chloride: 100 mmol/L (ref 98–111)
Creatinine, Ser: 0.82 mg/dL (ref 0.61–1.24)
GFR, Estimated: >60 mL/min (ref >60)
Glucose, Bld: 109 mg/dL — ABNORMAL HIGH (ref 70–99)
Potassium: 4.2 mmol/L (ref 3.5–5.1)
Sodium: 135 mmol/L (ref 135–145)
Total Bilirubin: 0.9 mg/dL (ref 0.3–1.2)
Total Protein: 6.1 g/dL — ABNORMAL LOW (ref 6.5–8.1)

## 2020-03-30 LAB — CBC WITH DIFFERENTIAL/PLATELET
Abs Immature Granulocytes: 0.03 10*3/uL (ref 0.00–0.07)
Basophils Absolute: 0 10*3/uL (ref 0.0–0.1)
Basophils Relative: 1 %
Eosinophils Absolute: 0.1 10*3/uL (ref 0.0–0.5)
Eosinophils Relative: 2 %
HCT: 39.6 % (ref 39.0–52.0)
Hemoglobin: 12.9 g/dL — ABNORMAL LOW (ref 13.0–17.0)
Immature Granulocytes: 1 %
Lymphocytes Relative: 20 %
Lymphs Abs: 1.3 10*3/uL (ref 0.7–4.0)
MCH: 32.3 pg (ref 26.0–34.0)
MCHC: 32.6 g/dL (ref 30.0–36.0)
MCV: 99 fL (ref 80.0–100.0)
Monocytes Absolute: 0.7 10*3/uL (ref 0.1–1.0)
Monocytes Relative: 10 %
Neutro Abs: 4.4 10*3/uL (ref 1.7–7.7)
Neutrophils Relative %: 66 %
Platelets: 328 10*3/uL (ref 150–400)
RBC: 4 MIL/uL — ABNORMAL LOW (ref 4.22–5.81)
RDW: 13.2 % (ref 11.5–15.5)
WBC: 6.6 10*3/uL (ref 4.0–10.5)
nRBC: 0 % (ref 0.0–0.2)

## 2020-03-30 NOTE — ED Triage Notes (Signed)
Assume care from EMS. EMS reports left leg and foot pain and pt states he is unable to ambulate and it has been going on for a while . EMS reports picking pt up from a gas station where employer called. Pt denies any chest pain/sob/n/v/d

## 2020-03-31 ENCOUNTER — Emergency Department (HOSPITAL_COMMUNITY)
Admission: EM | Admit: 2020-03-31 | Discharge: 2020-03-31 | Disposition: A | Discharge status: Home / Self Care | Payer: Self-pay | Attending: Emergency Medicine | Admitting: Emergency Medicine

## 2020-03-31 ENCOUNTER — Other Ambulatory Visit: Payer: Self-pay

## 2020-03-31 ENCOUNTER — Emergency Department (HOSPITAL_BASED_OUTPATIENT_CLINIC_OR_DEPARTMENT_OTHER): Payer: Self-pay

## 2020-03-31 ENCOUNTER — Inpatient Hospital Stay (HOSPITAL_COMMUNITY): Admit: 2020-03-31 | Payer: Self-pay

## 2020-03-31 ENCOUNTER — Encounter (HOSPITAL_COMMUNITY): Payer: Self-pay

## 2020-03-31 DIAGNOSIS — F17210 Nicotine dependence, cigarettes, uncomplicated: Secondary | ICD-10-CM | POA: Insufficient documentation

## 2020-03-31 DIAGNOSIS — M79605 Pain in left leg: Secondary | ICD-10-CM

## 2020-03-31 DIAGNOSIS — Z8547 Personal history of malignant neoplasm of testis: Secondary | ICD-10-CM | POA: Insufficient documentation

## 2020-03-31 DIAGNOSIS — M7989 Other specified soft tissue disorders: Secondary | ICD-10-CM

## 2020-03-31 MED ORDER — FUROSEMIDE 20 MG PO TABS
40.0000 mg | ORAL_TABLET | Freq: Once | ORAL | Status: AC
  Administered 2020-03-31: 40 mg via ORAL
  Filled 2020-03-31: qty 2

## 2020-03-31 MED ORDER — FUROSEMIDE 20 MG PO TABS: 20.0000 mg | ORAL_TABLET | Freq: Every day | ORAL | 0 refills | Status: DC

## 2020-03-31 MED ORDER — FUROSEMIDE 10 MG/ML IJ SOLN: 40.0000 mg | Freq: Once | INTRAMUSCULAR | Status: DC

## 2020-03-31 NOTE — ED Triage Notes (Signed)
Pt ambulates to the front with c/o left leg and foot pain.Pt was here for same. Pt denies any sob/n/v/d. Pt  Denies any new trauma. Pt was seen and d/c for same earlier today.

## 2020-03-31 NOTE — Progress Notes (Signed)
Orthopedic Tech Progress Note Patient Details:  Andrew Chaney [birth year] [id number]  Ortho Devices Type of Ortho Device: CAM walker Ortho Device/Splint Location: Left Lower Extremity Ortho Device/Splint Interventions: Application   Post Interventions Patient Tolerated: Refused intervention   [name] 03/31/2020, 1:43 AM

## 2020-03-31 NOTE — Discharge Instructions (Addendum)
Your legs are swollen so please take Lasix 20 mg daily for 3 days  You also have a calcaneal fracture so please use your postop shoe that you have  You need to see an orthopedic doctor.  You will be called to get a ultrasound of your leg in the morning  Return to ER if you have worse leg swelling, shortness of breath.

## 2020-03-31 NOTE — Discharge Instructions (Addendum)
As discussed, please use the discharge instructions provided you earlier this morning to follow-up with our orthopedic colleagues and at the [facility] for assistance with your medical needs.  This morning's evaluation has been reassuring.  If you develop new or concerning changes return here.

## 2020-03-31 NOTE — ED Notes (Signed)
Advised pt to use urinal, pt stated "Well I can't breath with this sticky thing on my finger". O2 sat 99% RA. Ortho Tech stated that pt refused CAM boot.

## 2020-03-31 NOTE — Progress Notes (Signed)
VASCULAR LAB    Left lower extremity venous duplex has been performed.  See CV proc for preliminary results.  Messaged results to Dr. Vanita Panda via secure chat.  Adrianne Shackleton, RVT 03/31/2020, 8:44 AM

## 2020-03-31 NOTE — ED Provider Notes (Signed)
[facility] EMERGENCY DEPARTMENT Provider Note   CSN: [id number] Arrival date & time: 03/30/20  2049     History Chief Complaint  Patient presents with  . Leg Pain    Andrew Chaney is a 65 y.o. male history of bipolar, homeless, he presented with leg swelling.  Patient actually had multiple ED visits this week for the same thing.  Patient apparently was at the gas station and his employer called because he had left foot pain.  Patient denies any recent falls or injury.  Upon review of records both at [facility] and at [facility], this is a recurrent problem.  Patient had a normal BNP several days ago.  Patient was also in the ED at [facility] for several days since he had some behavioral issues.  He was seen by psychiatry was cleared by psychiatry.  Patient was prescribed some Lasix but he states that he lost them.  Patient states that he is homeless again.   The history is provided by the patient.       Past Medical History:  Diagnosis Date  . Bipolar 1 disorder ([place])   . 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 Renal disorder     Patient Active Problem List   Diagnosis Date Noted  . Insomnia 03/23/2020  . Dupuytren's contracture 08/05/2019  . MSSA bacteremia 07/31/2019  . Altered mental status   . Fever 07/30/2019  . Non-purulent Cellulitis of right upper arm 07/30/2019  . 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 Seizure ([place]) 07/26/2019  . Status epilepticus ([place]) 07/26/2019  . Psychosis ([place]) 09/05/2018  . Bipolar disorder, most recent episode manic ([place]) 04/11/2018  . Bipolar I disorder, current or most recent episode manic, with psychotic features ([place]) 04/08/2018  . Confusion 01/03/2015  . Chronic hepatitis C ([place]) 12/20/2012  . Renal failure 12/17/2012  . Acute encephalopathy 12/17/2012  . ARF (acute renal failure) ([place]) 12/17/2012  . Hyponatremia 12/17/2012  . CAP (community acquired pneumonia)  12/17/2012  . Rhabdomyolysis 12/17/2012  . 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 TEE WITHOUT CARDIOVERSION N/A 08/03/2019   Procedure: TRANSESOPHAGEAL ECHOCARDIOGRAM (TEE);  Surgeon: Pixie Casino, MD;  Location: [facility]OSCOPY;  Service: Cardiovascular;  Laterality: N/A;       Family History  Problem Relation Age of Onset  . Dementia Mother     Social History   Tobacco Use  . Smoking status: Current Every Day Smoker    Packs/day: 0.50    Types: Cigarettes  . Smokeless tobacco: Never Used  Vaping Use  . Vaping Use: Never used  Substance Use Topics  . Alcohol use: Yes  . Drug use: Yes    Types: Marijuana    Comment: States no longer uses marijuana because he is "on probation"    Home Medications Prior to Admission medications   Medication Sig Start Date End Date Taking? Authorizing Provider  furosemide (LASIX) 20 MG tablet Take 1 tablet (20 mg total) by mouth daily. 03/31/20  Yes Drenda Freeze, MD  hydrOXYzine (ATARAX/VISTARIL) 25 MG tablet Take 1 tablet (25 mg total) by mouth at bedtime as needed (insomnia). Patient not taking: No sig reported 03/23/20   Patrecia Pour, NP  levETIRAcetam (KEPPRA) 500 MG tablet Take 1 tablet (500 mg total) by mouth 2 (two) times daily. Patient not taking: Reported on 03/23/2020 08/07/19 09/06/19  Edison Simon  Y, MD  phenytoin (DILANTIN) 200 MG ER capsule Take 1 capsule (200 mg total) by mouth 2 (two) times daily. Patient not taking: Reported on 03/23/2020 08/07/19 09/06/19  Andrew Au, MD    Allergies    Patient has no known allergies.  Review of Systems   Review of Systems  Cardiovascular: Positive for leg swelling.  All other systems reviewed and are negative.   Physical Exam Updated Vital Signs BP 128/79   Pulse 84   Temp 98.2 F (36.8 C)   Resp 16   SpO2 100%   Physical Exam Vitals and nursing note reviewed.  Constitutional:      Comments:  Disheveled, unkempt  HENT:     Head: Normocephalic.     Nose: Nose normal.     Mouth/Throat:     Mouth: Mucous membranes are moist.  Eyes:     Extraocular Movements: Extraocular movements intact.     Pupils: Pupils are equal, round, and reactive to light.  Cardiovascular:     Rate and Rhythm: Normal rate and regular rhythm.     Pulses: Normal pulses.     Heart sounds: Normal heart sounds.  Pulmonary:     Effort: Pulmonary effort is normal.     Breath sounds: Normal breath sounds.  Abdominal:     General: Abdomen is flat.     Palpations: Abdomen is soft.  Musculoskeletal:     Cervical back: Normal range of motion and neck supple.     Comments: Bilateral legs are swollen especially in the left leg and foot.  He has good pulses bilaterally  Skin:    General: Skin is warm.     Capillary Refill: Capillary refill takes less than 2 seconds.  Neurological:     General: No focal deficit present.     Mental Status: He is alert and oriented to person, place, and time.  Psychiatric:        Mood and Affect: Mood normal.        Behavior: Behavior normal.     ED Results / Procedures / Treatments   Labs (all labs ordered are listed, but only abnormal results are displayed) Labs Reviewed  COMPREHENSIVE METABOLIC PANEL - Abnormal; Notable for the following components:      Result Value   Glucose, Bld 109 (*)    Total Protein 6.1 (*)    Albumin 3.3 (*)    All other components within normal limits  CBC WITH DIFFERENTIAL/PLATELET - Abnormal; Notable for the following components:   RBC 4.00 (*)    Hemoglobin 12.9 (*)    All other components within normal limits  URINALYSIS, ROUTINE W REFLEX MICROSCOPIC    EKG None  Radiology DG Foot Complete Left  Result Date: 03/30/2020 CLINICAL DATA:  Left foot pain and swelling EXAM: LEFT FOOT - COMPLETE 3+ VIEW COMPARISON:  None. FINDINGS: There is comminuted fragmented posterior calcaneal fracture with sclerosis seen around the fracture  fragments. Overlying soft tissue swelling is seen. There is diffuse osteopenia. No other definite osseous fracture seen. Dorsal soft tissue swelling is seen. IMPRESSION: Comminuted fragmented posterior calcaneus fracture, age indeterminate. Electronically Signed   By: Prudencio Pair M.D.   On: 03/30/2020 21:48    Procedures Procedures   Medications Ordered in ED Medications  furosemide (LASIX) tablet 40 mg (has no administration in time range)    ED Course  I have reviewed the triage vital signs and the nursing notes.  Pertinent labs & imaging results that were available  during my care of the patient were reviewed by me and considered in my medical decision making (see chart for details).    MDM Rules/Calculators/A&P                         Andrew Chaney is a 65 y.o. male here presenting with bilateral leg swelling that is worse on the left.  Patient does have a calcaneal fracture on the left side that appears chronic.  I asked [employer] to put a cam walker outpatient by patient refused.  He actually came in with a postop shoe.  His chemistry is unremarkable.  Patient has very poor insight in his situation.  He states that he just does not feel well and wants a place to stay.  I told him that he can stay in a shelter.  Since no DVT study was done recently I ordered a DVT study to be done in the morning.    Final Clinical Impression(s) / ED Diagnoses Final diagnoses:  Leg swelling  Closed nondisplaced fracture of left calcaneus, unspecified portion of calcaneus, initial encounter    Rx / DC Orders ED Discharge Orders         Ordered    furosemide (LASIX) 20 MG tablet  Daily        03/31/20 0151    LE VENOUS        03/31/20 0153           Drenda Freeze, MD 03/31/20 0200

## 2020-03-31 NOTE — Discharge Planning (Signed)
RNCM met with pt at bedside and reminded him of [facility]) for medical and shelter needs.  Pt requested another "happy meal" and juice; RNCM provided requested items. Pt voiced the importance of following up with [facility] upon discharge today.  Pt states he does not need transportation at this time.

## 2020-03-31 NOTE — ED Provider Notes (Signed)
[facility] EMERGENCY DEPARTMENT Provider Note   CSN: [id number] Arrival date & time: 03/31/20  0353     History Chief Complaint  Patient presents with  . Leg Pain    Andrew Chaney is a 65 y.o. male.  HPI Patient presents for the second time in the past 12 hours with concern for ongoing leg swelling.  Patient has difficulty describing what may have changed since his evaluation.  He seemingly denies new fall, pain, changes in the characteristics of his discomfort.  He perseverates on his ongoing swelling.  However, the patient now notes that though he was discharged with plan for outpatient ultrasound, he has no telephone, cannot arrange this.    Past Medical History:  Diagnosis Date  . Bipolar 1 disorder ([place])   . 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 Renal disorder     Patient Active Problem List   Diagnosis Date Noted  . Insomnia 03/23/2020  . Dupuytren's contracture 08/05/2019  . MSSA bacteremia 07/31/2019  . Altered mental status   . Fever 07/30/2019  . Non-purulent Cellulitis of right upper arm 07/30/2019  . 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 Seizure ([place]) 07/26/2019  . Status epilepticus ([place]) 07/26/2019  . Psychosis ([place]) 09/05/2018  . Bipolar disorder, most recent episode manic ([place]) 04/11/2018  . Bipolar I disorder, current or most recent episode manic, with psychotic features ([place]) 04/08/2018  . Confusion 01/03/2015  . Chronic hepatitis C ([place]) 12/20/2012  . Renal failure 12/17/2012  . Acute encephalopathy 12/17/2012  . ARF (acute renal failure) ([place]) 12/17/2012  . Hyponatremia 12/17/2012  . CAP (community acquired pneumonia) 12/17/2012  . Rhabdomyolysis 12/17/2012  . 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 TEE WITHOUT CARDIOVERSION N/A 08/03/2019   Procedure: TRANSESOPHAGEAL ECHOCARDIOGRAM (TEE);  Surgeon: Pixie Casino,  MD;  Location: [facility] ENDOSCOPY;  Service: Cardiovascular;  Laterality: N/A;       Family History  Problem Relation Age of Onset  . Dementia Mother     Social History   Tobacco Use  . Smoking status: Current Every Day Smoker    Packs/day: 0.50    Types: Cigarettes  . Smokeless tobacco: Never Used  Vaping Use  . Vaping Use: Never used  Substance Use Topics  . Alcohol use: Yes  . Drug use: Yes    Types: Marijuana    Comment: States no longer uses marijuana because he is "on probation"    Home Medications Prior to Admission medications   Medication Sig Start Date End Date Taking? Authorizing Provider  furosemide (LASIX) 20 MG tablet Take 1 tablet (20 mg total) by mouth daily. 03/31/20   Drenda Freeze, MD  hydrOXYzine (ATARAX/VISTARIL) 25 MG tablet Take 1 tablet (25 mg total) by mouth at bedtime as needed (insomnia). Patient not taking: No sig reported 03/23/20   Patrecia Pour, NP  levETIRAcetam (KEPPRA) 500 MG tablet Take 1 tablet (500 mg total) by mouth 2 (two) times daily. Patient not taking: Reported on 03/23/2020 08/07/19 09/06/19  Andrew Au, MD  phenytoin (DILANTIN) 200 MG ER capsule Take 1 capsule (200 mg total) by mouth 2 (two) times daily. Patient not taking: Reported on 03/23/2020 08/07/19 09/06/19  Andrew Au, MD    Allergies    Patient has no known allergies.  Review of Systems   Review of Systems  Cardiovascular: Positive for leg swelling.  All other systems reviewed and are negative.   Physical Exam Updated Vital Signs BP 130/80   Pulse 94   Temp 97.7 F (36.5 C) (Oral)   Resp 20   SpO2 96%   Physical Exam Vitals and nursing note reviewed.  Constitutional:      Comments: Disheveled, unkempt  HENT:     Head: Normocephalic.     Nose: Nose normal.  Eyes:     Conjunctiva/sclera: Conjunctivae normal.  Cardiovascular:     Rate and Rhythm: Normal rate and regular rhythm.     Pulses: Normal pulses.     Heart sounds: Normal heart sounds.   Pulmonary:     Effort: Pulmonary effort is normal.     Breath sounds: Normal breath sounds.  Abdominal:     General: Abdomen is flat.     Palpations: Abdomen is soft.  Musculoskeletal:     Comments: Bilateral legs are swollen especially in the left leg and foot.  He has good pulses bilaterally, left foot now in postop shoe  Skin:    General: Skin is warm.     Capillary Refill: Capillary refill takes less than 2 seconds.  Neurological:     General: No focal deficit present.     Mental Status: He is alert and oriented to person, place, and time.  Psychiatric:     Comments: Withdrawn, not insightful     ED Results / Procedures / Treatments   Labs (all labs ordered are listed, but only abnormal results are displayed) Labs Reviewed - No data to display  EKG None  Radiology DG Foot Complete Left  Result Date: 03/30/2020 CLINICAL DATA:  Left foot pain and swelling EXAM: LEFT FOOT - COMPLETE 3+ VIEW COMPARISON:  None. FINDINGS: There is comminuted fragmented posterior calcaneal fracture with sclerosis seen around the fracture fragments. Overlying soft tissue swelling is seen. There is diffuse osteopenia. No other definite osseous fracture seen. Dorsal soft tissue swelling is seen. IMPRESSION: Comminuted fragmented posterior calcaneus fracture, age indeterminate. Electronically Signed   By: Prudencio Pair M.D.   On: 03/30/2020 21:48    Procedures Procedures   Medications Ordered in ED Medications - No data to display  ED Course  I have reviewed the triage vital signs and the nursing notes.  Pertinent labs & imaging results that were available during my care of the patient were reviewed by me and considered in my medical decision making (see chart for details).   Adult male, homeless, now presents with ongoing leg swelling, difficulty completing outpatient ultrasound secondary to his lack of communication device. Patient has no other acute changes compared to recent evaluation on  chart review, is awake, alert, sitting upright, but with consideration of his swelling, DVT, ultrasound was ordered.  Update: I discussed the patient's ultrasound with our sonographer no evidence for DVT.  On repeat exam the patient is in no distress.  Given his question of capacity to obtain his medications I discussed his case with her social work Medical laboratory scientific officer.  Patient has been upright, walking, without need for assistance.  11:06 AM Patient has been seen and evaluated by social work.  They note the patient is in roughly the same condition as he was during their most recent assessment.  They have discussed with him options for outpatient follow-up, assistance with medications. Patient has no new complaints, has no evidence for acute new pathology given today's reassuring ultrasound, his multiple prior recent studies are reassuring, absence of hemodynamic instability, and is demonstrated ability to  walk without assistance.  Patient discharged.  Final Clinical Impression(s) / ED Diagnoses Final diagnoses:  Left leg pain     Carmin Muskrat, MD 03/31/20 [id number]

## 2020-04-08 ENCOUNTER — Emergency Department (HOSPITAL_COMMUNITY)
Admission: EM | Admit: 2020-04-08 | Discharge: 2020-04-08 | Disposition: A | Discharge status: Home / Self Care | Payer: Medicaid Other | Attending: Emergency Medicine | Admitting: Emergency Medicine

## 2020-04-08 ENCOUNTER — Encounter (HOSPITAL_COMMUNITY): Payer: Self-pay

## 2020-04-08 DIAGNOSIS — F17210 Nicotine dependence, cigarettes, uncomplicated: Secondary | ICD-10-CM | POA: Insufficient documentation

## 2020-04-08 DIAGNOSIS — S92002G Unspecified fracture of left calcaneus, subsequent encounter for fracture with delayed healing: Secondary | ICD-10-CM | POA: Insufficient documentation

## 2020-04-08 DIAGNOSIS — R39198 Other difficulties with micturition: Secondary | ICD-10-CM | POA: Insufficient documentation

## 2020-04-08 DIAGNOSIS — X58XXXD Exposure to other specified factors, subsequent encounter: Secondary | ICD-10-CM | POA: Insufficient documentation

## 2020-04-08 DIAGNOSIS — R600 Localized edema: Secondary | ICD-10-CM | POA: Insufficient documentation

## 2020-04-08 DIAGNOSIS — Z8547 Personal history of malignant neoplasm of testis: Secondary | ICD-10-CM | POA: Insufficient documentation

## 2020-04-08 DIAGNOSIS — R339 Retention of urine, unspecified: Secondary | ICD-10-CM | POA: Insufficient documentation

## 2020-04-08 DIAGNOSIS — Z5900 Homelessness unspecified: Secondary | ICD-10-CM | POA: Insufficient documentation

## 2020-04-08 LAB — BASIC METABOLIC PANEL
Anion gap: 7 (ref 5–15)
BUN: 16 mg/dL (ref 8–23)
CO2: 25 mmol/L (ref 22–32)
Calcium: 9.2 mg/dL (ref 8.9–10.3)
Chloride: 104 mmol/L (ref 98–111)
Creatinine, Ser: 0.76 mg/dL (ref 0.61–1.24)
GFR, Estimated: >60 mL/min (ref >60)
Glucose, Bld: 103 mg/dL — ABNORMAL HIGH (ref 70–99)
Potassium: 3.7 mmol/L (ref 3.5–5.1)
Sodium: 136 mmol/L (ref 135–145)

## 2020-04-08 LAB — CBC WITH DIFFERENTIAL/PLATELET
Abs Immature Granulocytes: 0.03 10*3/uL (ref 0.00–0.07)
Basophils Absolute: 0 10*3/uL (ref 0.0–0.1)
Basophils Relative: 0 %
Eosinophils Absolute: 0 10*3/uL (ref 0.0–0.5)
Eosinophils Relative: 1 %
HCT: 39.4 % (ref 39.0–52.0)
Hemoglobin: 13.3 g/dL (ref 13.0–17.0)
Immature Granulocytes: 0 %
Lymphocytes Relative: 26 %
Lymphs Abs: 2.1 10*3/uL (ref 0.7–4.0)
MCH: 32.7 pg (ref 26.0–34.0)
MCHC: 33.8 g/dL (ref 30.0–36.0)
MCV: 96.8 fL (ref 80.0–100.0)
Monocytes Absolute: 0.7 10*3/uL (ref 0.1–1.0)
Monocytes Relative: 9 %
Neutro Abs: 5.2 10*3/uL (ref 1.7–7.7)
Neutrophils Relative %: 64 %
Platelets: 373 10*3/uL (ref 150–400)
RBC: 4.07 MIL/uL — ABNORMAL LOW (ref 4.22–5.81)
RDW: 13.2 % (ref 11.5–15.5)
WBC: 8.1 10*3/uL (ref 4.0–10.5)
nRBC: 0 % (ref 0.0–0.2)

## 2020-04-08 LAB — URINALYSIS, ROUTINE W REFLEX MICROSCOPIC
Glucose, UA: NEGATIVE mg/dL
Hgb urine dipstick: NEGATIVE
Ketones, ur: 5 mg/dL — AB
Leukocytes,Ua: NEGATIVE
Nitrite: NEGATIVE
Protein, ur: NEGATIVE mg/dL
Specific Gravity, Urine: 1.032 — ABNORMAL HIGH (ref 1.005–1.030)
pH: 5 (ref 5.0–8.0)

## 2020-04-08 NOTE — ED Triage Notes (Signed)
Picked up by police for medical eval. Homeless, with behavioral health hx, is paranoid currently. Patient told EMS that he is having problems with urination and swelling in lower extremities. BP 150/90, HR 104, 02 sat 95% RA, CBG 149.

## 2020-04-08 NOTE — ED Notes (Signed)
Bladder scan showed 18 ml of urine after voiding. Dr. Karle Starch made aware.

## 2020-04-08 NOTE — ED Provider Notes (Signed)
[facility] EMERGENCY DEPARTMENT Provider Note  CSN: [id number] Arrival date & time: 04/08/20 1756    History Chief Complaint  Patient presents with  . Urinary Retention    HPI  Andrew Chaney is a 65 y.o. male with history of homelessness and bipolar disorder picked up by [employer] today and brought to the ED for evaluation. He is not forthcoming with a history. Frequently begins to talk and then stops. Answers a few yes/no questions. Told EMS he was having difficulty urinating. He tells me he has 'not been hydrating well'. He has chronic LE edema, evaluated several times in ED for same recently, neg Doppler last week. He denies any hallucinations. Does not give me any other history. RN reports he was asking for something to eat.    Past Medical History:  Diagnosis Date  . Bipolar 1 disorder ([place])   . 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 Renal disorder     Past Surgical History:  Procedure Laterality Date  . TEE WITHOUT CARDIOVERSION N/A 08/03/2019   Procedure: TRANSESOPHAGEAL ECHOCARDIOGRAM (TEE);  Surgeon: Pixie Casino, MD;  Location: [facility] ENDOSCOPY;  Service: Cardiovascular;  Laterality: N/A;    Family History  Problem Relation Age of Onset  . Dementia Mother     Social History   Tobacco Use  . Smoking status: Current Every Day Smoker    Packs/day: 0.50    Types: Cigarettes  . Smokeless tobacco: Never Used  Vaping Use  . Vaping Use: Never used  Substance Use Topics  . Alcohol use: Yes  . Drug use: Yes    Types: Marijuana    Comment: States no longer uses marijuana because he is "on probation"     Home Medications Prior to Admission medications   Medication Sig Start Date End Date Taking? Authorizing Provider  furosemide (LASIX) 20 MG tablet Take 1 tablet (20 mg total) by mouth daily. 03/31/20   Drenda Freeze, MD  hydrOXYzine (ATARAX/VISTARIL) 25 MG tablet Take 1 tablet (25 mg total) by mouth at bedtime as needed (insomnia). Patient not taking: No  sig reported 03/23/20   Patrecia Pour, NP  levETIRAcetam (KEPPRA) 500 MG tablet Take 1 tablet (500 mg total) by mouth 2 (two) times daily. Patient not taking: Reported on 03/23/2020 08/07/19 09/06/19  Andrew Au, MD  phenytoin (DILANTIN) 200 MG ER capsule Take 1 capsule (200 mg total) by mouth 2 (two) times daily. Patient not taking: Reported on 03/23/2020 08/07/19 09/06/19  Andrew Au, MD     Allergies    Patient has no known allergies.   Review of Systems   Review of Systems Unable to assess due to mental status.    Physical Exam BP 128/82 (BP Location: Right Arm)   Pulse 89   Temp 98.5 F (36.9 C) (Oral)   Resp 18   SpO2 99%   Physical Exam Vitals and nursing note reviewed.  Constitutional:      Appearance: Normal appearance.     Comments: disheveled  HENT:     Head: Normocephalic and atraumatic.     Nose: Nose normal.     Mouth/Throat:     Mouth: Mucous membranes are dry.  Eyes:     Extraocular Movements: Extraocular movements intact.     Conjunctiva/sclera: Conjunctivae normal.  Cardiovascular:     Rate and Rhythm: Normal rate.  Pulmonary:     Effort: Pulmonary effort is normal.     Breath sounds: Normal breath sounds.  Abdominal:     General: Abdomen is flat.     Palpations: Abdomen is soft.     Tenderness: There is no abdominal tenderness.  Musculoskeletal:        General: No swelling. Normal range of motion.     Cervical back: Neck supple.     Right lower leg: Edema present.     Left lower leg: Edema present.     Comments: L foot in post-op shoe  Skin:    General: Skin is warm and dry.  Neurological:     General: No focal deficit present.     Mental Status: He is alert.     Comments: Halting speech but when he does speak it is clear and organized  Psychiatric:     Comments: Flat affect, cooperative      ED Results / Procedures / Treatments   Labs (all labs ordered are listed, but only abnormal results are displayed) Labs Reviewed  BASIC  METABOLIC PANEL - Abnormal; Notable for the following components:      Result Value   Glucose, Bld 103 (*)    All other components within normal limits  CBC WITH DIFFERENTIAL/PLATELET - Abnormal; Notable for the following components:   RBC 4.07 (*)    All other components within normal limits  URINALYSIS, ROUTINE W REFLEX MICROSCOPIC - Abnormal; Notable for the following components:   Color, Urine AMBER (*)    APPearance HAZY (*)    Specific Gravity, Urine 1.032 (*)    Bilirubin Urine SMALL (*)    Ketones, ur 5 (*)    All other components within normal limits    EKG None  Radiology No results found.  Procedures Procedures  Medications Ordered in the ED Medications - No data to display   MDM Rules/Calculators/A&P MDM Patient does not give much of a detailed history about why he is here. Best I can gather he is worried about being dehydrated with decreased urine output, has had kidney problems in the past. Exam is not consistent with urinary retention, but will check Bladder scan to confirm. Offered meal tray/fluids to drink. Will check basic labs.  ED Course  I have reviewed the triage vital signs and the nursing notes.  Pertinent labs & imaging results that were available during my care of the patient were reviewed by me and considered in my medical decision making (see chart for details).  Clinical Course as of 04/08/20 2107  Tue Apr 08, 2020  2041 CBC is normal. UA is neg for infection. He was able to urinate about 200cc with very small post-void residual.  [CS]  2043 BMP is normal.  [CS]  2104 Patient tolerating PO well. No other emergent medical condition identified. He has a known old calcaneal fracture which is why he is in the boot. Advised to follow up with Ortho. Otherwise cleared for discharge.  [CS]    Clinical Course User Index [CS] Truddie Hidden, MD    Final Clinical Impression(s) / ED Diagnoses Final diagnoses:  Homelessness  Closed displaced  fracture of left calcaneus with delayed healing, unspecified portion of calcaneus, subsequent encounter    Rx / DC Orders ED Discharge Orders    None       Truddie Hidden, MD 04/08/20 2107

## 2020-04-09 ENCOUNTER — Encounter (HOSPITAL_COMMUNITY): Payer: Self-pay | Admitting: Emergency Medicine

## 2020-04-09 ENCOUNTER — Other Ambulatory Visit: Payer: Self-pay

## 2020-04-09 ENCOUNTER — Emergency Department (HOSPITAL_COMMUNITY)
Admission: EM | Admit: 2020-04-09 | Discharge: 2020-04-09 | Disposition: A | Discharge status: Home / Self Care | Payer: Self-pay | Attending: Emergency Medicine | Admitting: Emergency Medicine

## 2020-04-09 ENCOUNTER — Emergency Department (HOSPITAL_COMMUNITY): Payer: Self-pay

## 2020-04-09 DIAGNOSIS — Z5900 Homelessness unspecified: Secondary | ICD-10-CM

## 2020-04-09 DIAGNOSIS — Z8547 Personal history of malignant neoplasm of testis: Secondary | ICD-10-CM | POA: Insufficient documentation

## 2020-04-09 DIAGNOSIS — I10 Essential (primary) hypertension: Secondary | ICD-10-CM | POA: Insufficient documentation

## 2020-04-09 DIAGNOSIS — R600 Localized edema: Secondary | ICD-10-CM | POA: Insufficient documentation

## 2020-04-09 DIAGNOSIS — F17210 Nicotine dependence, cigarettes, uncomplicated: Secondary | ICD-10-CM | POA: Insufficient documentation

## 2020-04-09 DIAGNOSIS — M79672 Pain in left foot: Secondary | ICD-10-CM | POA: Insufficient documentation

## 2020-04-09 DIAGNOSIS — G8929 Other chronic pain: Secondary | ICD-10-CM

## 2020-04-09 DIAGNOSIS — Z79899 Other long term (current) drug therapy: Secondary | ICD-10-CM | POA: Insufficient documentation

## 2020-04-09 DIAGNOSIS — G4700 Insomnia, unspecified: Secondary | ICD-10-CM | POA: Insufficient documentation

## 2020-04-09 LAB — CBC WITH DIFFERENTIAL/PLATELET
Abs Immature Granulocytes: 0.02 10*3/uL (ref 0.00–0.07)
Basophils Absolute: 0 10*3/uL (ref 0.0–0.1)
Basophils Relative: 0 %
Eosinophils Absolute: 0 10*3/uL (ref 0.0–0.5)
Eosinophils Relative: 0 %
HCT: 42 % (ref 39.0–52.0)
Hemoglobin: 13.5 g/dL (ref 13.0–17.0)
Immature Granulocytes: 0 %
Lymphocytes Relative: 19 %
Lymphs Abs: 1.5 10*3/uL (ref 0.7–4.0)
MCH: 32 pg (ref 26.0–34.0)
MCHC: 32.1 g/dL (ref 30.0–36.0)
MCV: 99.5 fL (ref 80.0–100.0)
Monocytes Absolute: 0.7 10*3/uL (ref 0.1–1.0)
Monocytes Relative: 9 %
Neutro Abs: 5.5 10*3/uL (ref 1.7–7.7)
Neutrophils Relative %: 72 %
Platelets: 367 10*3/uL (ref 150–400)
RBC: 4.22 MIL/uL (ref 4.22–5.81)
RDW: 13.2 % (ref 11.5–15.5)
WBC: 7.7 10*3/uL (ref 4.0–10.5)
nRBC: 0 % (ref 0.0–0.2)

## 2020-04-09 LAB — COMPREHENSIVE METABOLIC PANEL
ALT: 19 U/L (ref 0–44)
AST: 27 U/L (ref 15–41)
Albumin: 3.8 g/dL (ref 3.5–5.0)
Alkaline Phosphatase: 79 U/L (ref 38–126)
Anion gap: 9 (ref 5–15)
BUN: 21 mg/dL (ref 8–23)
CO2: 24 mmol/L (ref 22–32)
Calcium: 9.9 mg/dL (ref 8.9–10.3)
Chloride: 105 mmol/L (ref 98–111)
Creatinine, Ser: 1.04 mg/dL (ref 0.61–1.24)
GFR, Estimated: >60 mL/min (ref >60)
Glucose, Bld: 104 mg/dL — ABNORMAL HIGH (ref 70–99)
Potassium: 4.5 mmol/L (ref 3.5–5.1)
Sodium: 138 mmol/L (ref 135–145)
Total Bilirubin: 1.1 mg/dL (ref 0.3–1.2)
Total Protein: 7 g/dL (ref 6.5–8.1)

## 2020-04-09 LAB — AMMONIA: Ammonia: 17 umol/L (ref 9–35)

## 2020-04-09 LAB — ETHANOL: Alcohol, Ethyl (B): <10 mg/dL (ref <10)

## 2020-04-09 NOTE — Discharge Instructions (Signed)
You were brought to the emergency department for concerns of altered mental status.  Your physical exam and lab work were reassuring.  I have given you information to follow-up with local shelters and outpatient counseling.  Also given information information follow-up with  community health and wellness; they will see without insurance and can help you obtain medication.

## 2020-04-09 NOTE — ED Notes (Signed)
Pt discharged from this ED in stable condition at this time. All discharge instructions and follow up care reviewed with pt with no further questions at this time. Pt ambulatory with steady gait, clear speech.  

## 2020-04-09 NOTE — ED Provider Notes (Signed)
[place] [facility] DEPT Provider Note   CSN: [id number] Arrival date & time: 04/09/20  1103     History Chief Complaint  Patient presents with  . Anxiety  . Hypertension    Andrew Chaney is a 65 y.o. male with a history of bipolar 1 disorder, insomnia, and homelessness.  Patient was brought to emergency department by EMS after being found wandering the streets in a concerning manner.  Patient was found to be hypertensive with EMS at 136/96, anxious, and tearful.  When asked patient reports pain to the left foot.  Unable to say how long pain has been present.  He reports that pain is worse with standing.  Denies any alleviating factors.  Per chart review patient was seen at x-ray performed on 03/30/2020 which showed calcaneal fracture to left foot that appears chronic.  At that visit patient presented with postop shoe and refused cam walker.  During exam patient does not make eye contact and is slow to respond to questions.  Patient endorses insomnia.   Patient does answer questions appropriately after falls.  Patient denies any suicidal ideations, homicidal ideations, auditory hallucinations, visual hallucinations, racing thoughts, anxiety or depression.  Patient denies any recent falls or injuries.  Patient endorses marijuana use.  Patient endorses occasionally drinking alcohol.  HPI     Past Medical History:  Diagnosis Date  . Bipolar 1 disorder ([place])   . 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 Renal disorder     Patient Active Problem List   Diagnosis Date Noted  . Insomnia 03/23/2020  . Dupuytren's contracture 08/05/2019  . MSSA bacteremia 07/31/2019  . Altered mental status   . Fever 07/30/2019  . Non-purulent Cellulitis of right upper arm 07/30/2019  . 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 Seizure ([place]) 07/26/2019  . Status epilepticus ([place]) 07/26/2019  . Psychosis ([place]) 09/05/2018  . Bipolar  disorder, most recent episode manic ([place]) 04/11/2018  . Bipolar I disorder, current or most recent episode manic, with psychotic features ([place]) 04/08/2018  . Confusion 01/03/2015  . Chronic hepatitis C ([place]) 12/20/2012  . Renal failure 12/17/2012  . Acute encephalopathy 12/17/2012  . ARF (acute renal failure) ([place]) 12/17/2012  . Hyponatremia 12/17/2012  . CAP (community acquired pneumonia) 12/17/2012  . Rhabdomyolysis 12/17/2012  . 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 TEE WITHOUT CARDIOVERSION N/A 08/03/2019   Procedure: TRANSESOPHAGEAL ECHOCARDIOGRAM (TEE);  Surgeon: Pixie Casino, MD;  Location: [facility] ENDOSCOPY;  Service: Cardiovascular;  Laterality: N/A;       Family History  Problem Relation Age of Onset  . Dementia Mother     Social History   Tobacco Use  . Smoking status: Current Every Day Smoker    Packs/day: 0.50    Types: Cigarettes  . Smokeless tobacco: Never Used  Vaping Use  . Vaping Use: Never used  Substance Use Topics  . Alcohol use: Yes  . Drug use: Yes    Types: Marijuana    Comment: States no longer uses marijuana because he is "on probation"    Home Medications Prior to Admission medications   Medication Sig Start Date End Date Taking? Authorizing Provider  furosemide (LASIX) 20 MG tablet Take 1 tablet (20 mg total) by mouth daily. 03/31/20   Drenda Freeze, MD  hydrOXYzine (ATARAX/VISTARIL) 25 MG tablet Take 1 tablet (25 mg total) by mouth at bedtime as  needed (insomnia). Patient not taking: No sig reported 03/23/20   Patrecia Pour, NP  levETIRAcetam (KEPPRA) 500 MG tablet Take 1 tablet (500 mg total) by mouth 2 (two) times daily. Patient not taking: Reported on 03/23/2020 08/07/19 09/06/19  Andrew Au, MD  phenytoin (DILANTIN) 200 MG ER capsule Take 1 capsule (200 mg total) by mouth 2 (two) times daily. Patient not taking: Reported on 03/23/2020 08/07/19 09/06/19  Andrew Au, MD    Allergies    Patient has no known allergies.  Review of Systems   Review of Systems  Constitutional: Negative for chills and fever.  Eyes: Negative for visual disturbance.  Respiratory: Negative for shortness of breath.   Cardiovascular: Positive for leg swelling. Negative for chest pain.  Gastrointestinal: Negative for abdominal distention, nausea and vomiting.  Genitourinary: Negative for difficulty urinating.  Musculoskeletal: Negative for arthralgias, back pain and neck pain.  Skin: Negative for color change and wound.  Neurological: Negative for dizziness, syncope, light-headedness and headaches.  Psychiatric/Behavioral: Positive for sleep disturbance. Negative for confusion, hallucinations and suicidal ideas. The patient is not nervous/anxious.     Physical Exam Updated Vital Signs BP 131/88 (BP Location: Right Arm)   Pulse 95   Temp 98.5 F (36.9 C) (Oral)   SpO2 99%   Physical Exam Vitals and nursing note reviewed.  Constitutional:      General: He is not in acute distress.    Appearance: He is not ill-appearing, toxic-appearing or diaphoretic.     Comments: unkempt and disheveled  HENT:     Head: Normocephalic and atraumatic. No raccoon eyes, Battle's sign, abrasion, contusion, masses, right periorbital erythema, left periorbital erythema or laceration.     Jaw: No trismus or pain on movement.     Mouth/Throat:     Pharynx: Oropharynx is clear. Uvula midline.  Eyes:     General: No scleral icterus.       Right eye: No discharge.        Left eye: No discharge.     Extraocular Movements: Extraocular movements intact.     Pupils: Pupils are equal, round, and reactive to light.  Cardiovascular:     Rate and Rhythm: Normal rate.     Pulses:          Dorsalis pedis pulses are 3+ on the left side.  Pulmonary:     Effort: Pulmonary effort is normal. No tachypnea, bradypnea or respiratory distress.     Breath sounds: Normal breath sounds. No stridor.   Abdominal:     General: Abdomen is flat. There is no distension. There are no signs of injury.     Palpations: Abdomen is soft. There is no mass or pulsatile mass.     Tenderness: There is no abdominal tenderness. There is no guarding or rebound.  Musculoskeletal:     Cervical back: Normal range of motion and neck supple. No rigidity.     Right lower leg: No deformity, lacerations, tenderness or bony tenderness. 2+ Edema present.     Left lower leg: No deformity, lacerations, tenderness or bony tenderness. 2+ Edema present.     Left foot: Normal range of motion and normal capillary refill. No swelling, deformity, laceration, tenderness or bony tenderness.     Comments: +2 pitting edema noted to bilateral lower extremities and ankles.    Feet:     Left foot:     Skin integrity: Skin integrity normal.     Toenail Condition: Left toenails are abnormally  thick.  Skin:    General: Skin is warm and dry.     Coloration: Skin is not jaundiced or pale.  Neurological:     General: No focal deficit present.     Mental Status: He is alert and oriented to person, place, and time.     GCS: GCS eye subscore is 4. GCS verbal subscore is 5. GCS motor subscore is 6.     Cranial Nerves: No cranial nerve deficit or facial asymmetry.     Sensory: Sensation is intact.     Motor: No weakness, tremor, seizure activity or pronator drift.     Coordination: Finger-Nose-Finger Test normal.     Gait: Gait is intact. Gait normal.     Comments: CN II-XII intact, equal grip strength, +5 strength to bilateral upper and lower extremities    Psychiatric:        Attention and Perception: He is attentive. He does not perceive auditory or visual hallucinations.        Mood and Affect: Affect is blunt.        Speech: Speech is delayed.        Behavior: Behavior is slowed and withdrawn. Behavior is not agitated or aggressive. Behavior is cooperative.        Thought Content: Thought content is not paranoid or  delusional. Thought content does not include homicidal or suicidal ideation. Thought content does not include homicidal or suicidal plan.     ED Results / Procedures / Treatments   Labs (all labs ordered are listed, but only abnormal results are displayed) Labs Reviewed  COMPREHENSIVE METABOLIC PANEL - Abnormal; Notable for the following components:      Result Value   Glucose, Bld 104 (*)    All other components within normal limits  CBC WITH DIFFERENTIAL/PLATELET  ETHANOL  AMMONIA  RAPID URINE DRUG SCREEN, HOSP PERFORMED  URINALYSIS, ROUTINE W REFLEX MICROSCOPIC  CBG MONITORING, ED    EKG None  Radiology DG Chest Portable 1 View  Result Date: 04/09/2020 CLINICAL DATA:  Fall Altered mental status EXAM: PORTABLE CHEST 1 VIEW COMPARISON:  03/28/2020 FINDINGS: The heart size and mediastinal contours are within normal limits. Both lungs are clear. Benign-appearing chondroid lesion again seen in the left proximal humeral diaphysis. It is unchanged dating back to 07/26/2019. IMPRESSION: No acute cardiopulmonary process. Electronically Signed   By: Miachel Roux M.D.   On: 04/09/2020 12:45    Procedures Procedures   Medications Ordered in ED Medications - No data to display  ED Course  I have reviewed the triage vital signs and the nursing notes.  Pertinent labs & imaging results that were available during my care of the patient were reviewed by me and considered in my medical decision making (see chart for details).    MDM Rules/Calculators/A&P                          Alert 65 year old male in no acute distress, nontoxic-appearing.  Patient was brought to emergency department by EMS after being found wandering the streets in a concerning manner.  Patient was found to be hypertensive with EMS at 136/96, anxious, and tearful.  At present patient complains of pain to his left heel.  Patient has history of chronic left calcaneal fracture.    Patient denies any suicidal ideation,  homicidal ideation, auditory hallucinations, visual hallucinations, racing thoughts, anxiety or depression.  Patient endorses insomnia.  Patient has history of the same.  Patient  had TTS consult on 3/13 was seen.  Due to reports of having difficulty understanding people, holding conversation, and difficulty sleeping.  Patient was psychiatric cleared at that time.  On physical exam patient has no focal neurological deficits.  Head is atraumatic.  No midline tenderness, step-off, or deformity noted.    Due to complaints of earlier altered mental status with EMS will obtain CBC, CMP, ethanol, ammonia, UA, UDS, and CBG, and chest x-ray.   CMP, CBC, ammonia, ethanol are all unremarkable.  Patient unable to give urine sample however urinalysis performed yesterday showed no signs of urinary tract infection.  On repeat examination patient in no acute distress, nontoxic-appearing, no change in mental status.  Will give patient resources for local shelters and outpatient counseling.  Will have patient follow-up with [facility] and wellness as he has no insurance.  Discussed results, findings, treatment and follow up. Patient advised of return precautions. Patient verbalized understanding and agreed with plan.   Final Clinical Impression(s) / ED Diagnoses Final diagnoses:  Homelessness  Chronic pain of left heel    Rx / DC Orders ED Discharge Orders    None       Loni Beckwith, PA-C 04/09/20 1722    Lorelle Gibbs, DO 04/10/20 [phone]

## 2020-04-09 NOTE — ED Triage Notes (Signed)
EMS arrived on the seen to police and social work. The patient was found wondering the street [place] that caused concern. He was alert and oriented only to person and year. EMS also found him to be hypertensive, anxious, tearful and in pain. The patient was seen yesterday at the hospital for a fractured calcaneus.   Hx: seizure, hypertension  EMS vitals: 136/96 BP 110 CBG 92 HR 94% SPO2 on room air

## 2020-04-15 ENCOUNTER — Other Ambulatory Visit: Payer: Self-pay

## 2020-04-15 ENCOUNTER — Inpatient Hospital Stay (HOSPITAL_COMMUNITY): Payer: Self-pay

## 2020-04-15 ENCOUNTER — Emergency Department (HOSPITAL_COMMUNITY): Payer: Self-pay

## 2020-04-15 ENCOUNTER — Encounter (HOSPITAL_COMMUNITY): Payer: Self-pay

## 2020-04-15 ENCOUNTER — Inpatient Hospital Stay (HOSPITAL_COMMUNITY)
Admission: EM | Admit: 2020-04-15 | Discharge: 2020-05-09 | DRG: 853 | Disposition: A | Discharge status: Skilled Nursing Facility | Payer: Self-pay | Attending: Internal Medicine | Admitting: Internal Medicine

## 2020-04-15 DIAGNOSIS — A419 Sepsis, unspecified organism: Secondary | ICD-10-CM

## 2020-04-15 DIAGNOSIS — M79672 Pain in left foot: Secondary | ICD-10-CM

## 2020-04-15 DIAGNOSIS — B182 Chronic viral hepatitis C: Secondary | ICD-10-CM | POA: Diagnosis present

## 2020-04-15 DIAGNOSIS — Z79899 Other long term (current) drug therapy: Secondary | ICD-10-CM

## 2020-04-15 DIAGNOSIS — Z5900 Homelessness unspecified: Secondary | ICD-10-CM

## 2020-04-15 DIAGNOSIS — E538 Deficiency of other specified B group vitamins: Secondary | ICD-10-CM | POA: Diagnosis present

## 2020-04-15 DIAGNOSIS — M86272 Subacute osteomyelitis, left ankle and foot: Secondary | ICD-10-CM

## 2020-04-15 DIAGNOSIS — E43 Unspecified severe protein-calorie malnutrition: Secondary | ICD-10-CM | POA: Insufficient documentation

## 2020-04-15 DIAGNOSIS — F1010 Alcohol abuse, uncomplicated: Secondary | ICD-10-CM | POA: Diagnosis present

## 2020-04-15 DIAGNOSIS — B9562 Methicillin resistant Staphylococcus aureus infection as the cause of diseases classified elsewhere: Secondary | ICD-10-CM

## 2020-04-15 DIAGNOSIS — L02612 Cutaneous abscess of left foot: Secondary | ICD-10-CM

## 2020-04-15 DIAGNOSIS — D6489 Other specified anemias: Secondary | ICD-10-CM | POA: Diagnosis present

## 2020-04-15 DIAGNOSIS — Z8547 Personal history of malignant neoplasm of testis: Secondary | ICD-10-CM

## 2020-04-15 DIAGNOSIS — S92002A Unspecified fracture of left calcaneus, initial encounter for closed fracture: Secondary | ICD-10-CM | POA: Diagnosis present

## 2020-04-15 DIAGNOSIS — F1210 Cannabis abuse, uncomplicated: Secondary | ICD-10-CM | POA: Diagnosis present

## 2020-04-15 DIAGNOSIS — R7881 Bacteremia: Secondary | ICD-10-CM

## 2020-04-15 DIAGNOSIS — F419 Anxiety disorder, unspecified: Secondary | ICD-10-CM | POA: Diagnosis present

## 2020-04-15 DIAGNOSIS — M86672 Other chronic osteomyelitis, left ankle and foot: Secondary | ICD-10-CM | POA: Diagnosis present

## 2020-04-15 DIAGNOSIS — L89210 Pressure ulcer of right hip, unstageable: Secondary | ICD-10-CM | POA: Diagnosis present

## 2020-04-15 DIAGNOSIS — F1990 Other psychoactive substance use, unspecified, uncomplicated: Secondary | ICD-10-CM

## 2020-04-15 DIAGNOSIS — G4700 Insomnia, unspecified: Secondary | ICD-10-CM | POA: Diagnosis present

## 2020-04-15 DIAGNOSIS — A4102 Sepsis due to Methicillin resistant Staphylococcus aureus: Principal | ICD-10-CM | POA: Diagnosis present

## 2020-04-15 DIAGNOSIS — F3110 Bipolar disorder, current episode manic without psychotic features, unspecified: Secondary | ICD-10-CM | POA: Diagnosis present

## 2020-04-15 DIAGNOSIS — R6521 Severe sepsis with septic shock: Secondary | ICD-10-CM | POA: Diagnosis present

## 2020-04-15 DIAGNOSIS — Z6821 Body mass index (BMI) 21.0-21.9, adult: Secondary | ICD-10-CM

## 2020-04-15 DIAGNOSIS — R509 Fever, unspecified: Secondary | ICD-10-CM

## 2020-04-15 DIAGNOSIS — Z20822 Contact with and (suspected) exposure to covid-19: Secondary | ICD-10-CM | POA: Diagnosis present

## 2020-04-15 DIAGNOSIS — J9811 Atelectasis: Secondary | ICD-10-CM | POA: Diagnosis present

## 2020-04-15 DIAGNOSIS — R000 Tachycardia, unspecified: Secondary | ICD-10-CM | POA: Diagnosis present

## 2020-04-15 DIAGNOSIS — E876 Hypokalemia: Secondary | ICD-10-CM | POA: Diagnosis present

## 2020-04-15 DIAGNOSIS — G40909 Epilepsy, unspecified, not intractable, without status epilepticus: Secondary | ICD-10-CM | POA: Diagnosis present

## 2020-04-15 DIAGNOSIS — Z89512 Acquired absence of left leg below knee: Secondary | ICD-10-CM

## 2020-04-15 DIAGNOSIS — R54 Age-related physical debility: Secondary | ICD-10-CM | POA: Diagnosis not present

## 2020-04-15 DIAGNOSIS — Z818 Family history of other mental and behavioral disorders: Secondary | ICD-10-CM

## 2020-04-15 DIAGNOSIS — E871 Hypo-osmolality and hyponatremia: Secondary | ICD-10-CM | POA: Diagnosis present

## 2020-04-15 DIAGNOSIS — M869 Osteomyelitis, unspecified: Secondary | ICD-10-CM

## 2020-04-15 DIAGNOSIS — L089 Local infection of the skin and subcutaneous tissue, unspecified: Secondary | ICD-10-CM

## 2020-04-15 DIAGNOSIS — O8604 Sepsis following an obstetrical procedure: Secondary | ICD-10-CM

## 2020-04-15 DIAGNOSIS — F319 Bipolar disorder, unspecified: Secondary | ICD-10-CM | POA: Diagnosis present

## 2020-04-15 DIAGNOSIS — Z9119 Patient's noncompliance with other medical treatment and regimen: Secondary | ICD-10-CM

## 2020-04-15 DIAGNOSIS — F17210 Nicotine dependence, cigarettes, uncomplicated: Secondary | ICD-10-CM | POA: Diagnosis present

## 2020-04-15 DIAGNOSIS — M009 Pyogenic arthritis, unspecified: Secondary | ICD-10-CM | POA: Diagnosis present

## 2020-04-15 DIAGNOSIS — I878 Other specified disorders of veins: Secondary | ICD-10-CM

## 2020-04-15 LAB — BASIC METABOLIC PANEL
Anion gap: 6 (ref 5–15)
BUN: 13 mg/dL (ref 8–23)
CO2: 26 mmol/L (ref 22–32)
Calcium: 8.8 mg/dL — ABNORMAL LOW (ref 8.9–10.3)
Chloride: 102 mmol/L (ref 98–111)
Creatinine, Ser: 0.84 mg/dL (ref 0.61–1.24)
GFR, Estimated: >60 mL/min (ref >60)
Glucose, Bld: 110 mg/dL — ABNORMAL HIGH (ref 70–99)
Potassium: 3.5 mmol/L (ref 3.5–5.1)
Sodium: 134 mmol/L — ABNORMAL LOW (ref 135–145)

## 2020-04-15 LAB — CBC WITH DIFFERENTIAL/PLATELET
Abs Immature Granulocytes: 0.18 10*3/uL — ABNORMAL HIGH (ref 0.00–0.07)
Basophils Absolute: 0 10*3/uL (ref 0.0–0.1)
Basophils Relative: 0 %
Eosinophils Absolute: 0 10*3/uL (ref 0.0–0.5)
Eosinophils Relative: 0 %
HCT: 33.8 % — ABNORMAL LOW (ref 39.0–52.0)
Hemoglobin: 11.3 g/dL — ABNORMAL LOW (ref 13.0–17.0)
Immature Granulocytes: 1 %
Lymphocytes Relative: 5 %
Lymphs Abs: 0.8 10*3/uL (ref 0.7–4.0)
MCH: 32.5 pg (ref 26.0–34.0)
MCHC: 33.4 g/dL (ref 30.0–36.0)
MCV: 97.1 fL (ref 80.0–100.0)
Monocytes Absolute: 1.6 10*3/uL — ABNORMAL HIGH (ref 0.1–1.0)
Monocytes Relative: 10 %
Neutro Abs: 12.7 10*3/uL — ABNORMAL HIGH (ref 1.7–7.7)
Neutrophils Relative %: 84 %
Platelets: 261 10*3/uL (ref 150–400)
RBC: 3.48 MIL/uL — ABNORMAL LOW (ref 4.22–5.81)
RDW: 12.8 % (ref 11.5–15.5)
WBC: 15.2 10*3/uL — ABNORMAL HIGH (ref 4.0–10.5)
nRBC: 0 % (ref 0.0–0.2)

## 2020-04-15 LAB — LACTIC ACID, PLASMA: Lactic Acid, Venous: 1.9 mmol/L (ref 0.5–1.9)

## 2020-04-15 LAB — RESP PANEL BY RT-PCR (FLU A&B, COVID) ARPGX2
Influenza A by PCR: NEGATIVE
Influenza B by PCR: NEGATIVE
SARS Coronavirus 2 by RT PCR: NEGATIVE

## 2020-04-15 LAB — C-REACTIVE PROTEIN: CRP: 19.5 mg/dL — ABNORMAL HIGH (ref <1.0)

## 2020-04-15 LAB — SEDIMENTATION RATE: Sed Rate: 38 mm/hr — ABNORMAL HIGH (ref 0–16)

## 2020-04-15 MED ORDER — ACETAMINOPHEN 325 MG PO TABS
650.0000 mg | ORAL_TABLET | Freq: Four times a day (QID) | ORAL | Status: DC | PRN
  Administered 2020-04-16 (×2): 650 mg via ORAL
  Filled 2020-04-15 (×2): qty 2

## 2020-04-15 MED ORDER — HYDROCODONE-ACETAMINOPHEN 5-325 MG PO TABS
1.0000 | ORAL_TABLET | ORAL | Status: DC | PRN
  Administered 2020-04-16 – 2020-04-22 (×6): 2 via ORAL
  Administered 2020-04-22: 1 via ORAL
  Administered 2020-04-22 – 2020-04-24 (×3): 2 via ORAL
  Filled 2020-04-15 (×5): qty 2
  Filled 2020-04-15: qty 1
  Filled 2020-04-15 (×4): qty 2

## 2020-04-15 MED ORDER — VANCOMYCIN HCL 1500 MG/300ML IV SOLN
1500.0000 mg | Freq: Once | INTRAVENOUS | Status: AC
  Administered 2020-04-15: 1500 mg via INTRAVENOUS
  Filled 2020-04-15: qty 300

## 2020-04-15 MED ORDER — SODIUM CHLORIDE 0.9 % IV SOLN: INTRAVENOUS | Status: DC

## 2020-04-15 MED ORDER — THIAMINE HCL 100 MG/ML IJ SOLN: 100.0000 mg | Freq: Every day | INTRAMUSCULAR | Status: DC

## 2020-04-15 MED ORDER — SENNA 8.6 MG PO TABS
1.0000 | ORAL_TABLET | Freq: Two times a day (BID) | ORAL | Status: DC
  Administered 2020-04-16 – 2020-05-06 (×29): 8.6 mg via ORAL
  Filled 2020-04-15 (×39): qty 1

## 2020-04-15 MED ORDER — VANCOMYCIN HCL 1000 MG/200ML IV SOLN
1000.0000 mg | Freq: Two times a day (BID) | INTRAVENOUS | Status: DC
  Administered 2020-04-16 – 2020-04-28 (×22): 1000 mg via INTRAVENOUS
  Filled 2020-04-15 (×29): qty 200

## 2020-04-15 MED ORDER — LORAZEPAM 2 MG/ML IJ SOLN
0.5000 mg | Freq: Once | INTRAMUSCULAR | Status: AC
  Administered 2020-04-15: 0.5 mg via INTRAVENOUS
  Filled 2020-04-15 (×2): qty 1

## 2020-04-15 MED ORDER — SODIUM CHLORIDE 0.9 % IV BOLUS
1000.0000 mL | Freq: Once | INTRAVENOUS | Status: AC
  Administered 2020-04-15: 1000 mL via INTRAVENOUS

## 2020-04-15 MED ORDER — LORAZEPAM 2 MG/ML IJ SOLN: 1.0000 mg | INTRAMUSCULAR | Status: DC | PRN

## 2020-04-15 MED ORDER — METRONIDAZOLE IN NACL 5-0.79 MG/ML-% IV SOLN
500.0000 mg | Freq: Three times a day (TID) | INTRAVENOUS | Status: DC
  Administered 2020-04-16 – 2020-04-17 (×3): 500 mg via INTRAVENOUS
  Filled 2020-04-15 (×4): qty 100

## 2020-04-15 MED ORDER — ACETAMINOPHEN 325 MG PO TABS
650.0000 mg | ORAL_TABLET | Freq: Once | ORAL | Status: AC
  Administered 2020-04-15: 650 mg via ORAL
  Filled 2020-04-15: qty 2

## 2020-04-15 MED ORDER — DOCUSATE SODIUM 100 MG PO CAPS
100.0000 mg | ORAL_CAPSULE | Freq: Two times a day (BID) | ORAL | Status: DC
  Administered 2020-04-16: 100 mg via ORAL
  Filled 2020-04-15: qty 1

## 2020-04-15 MED ORDER — THIAMINE HCL 100 MG/ML IJ SOLN
100.0000 mg | Freq: Every day | INTRAMUSCULAR | Status: DC
  Administered 2020-04-15 – 2020-04-20 (×6): 100 mg via INTRAVENOUS
  Filled 2020-04-15 (×6): qty 2

## 2020-04-15 MED ORDER — ADULT MULTIVITAMIN W/MINERALS CH
1.0000 | ORAL_TABLET | Freq: Every day | ORAL | Status: DC
  Administered 2020-04-17 – 2020-05-09 (×23): 1 via ORAL
  Filled 2020-04-15 (×23): qty 1

## 2020-04-15 MED ORDER — SODIUM CHLORIDE 0.9 % IV BOLUS
1000.0000 mL | Freq: Once | INTRAVENOUS | Status: AC
  Administered 2020-04-16: 1000 mL via INTRAVENOUS

## 2020-04-15 MED ORDER — SODIUM CHLORIDE 0.9 % IV SOLN
2.0000 g | Freq: Three times a day (TID) | INTRAVENOUS | Status: DC
  Administered 2020-04-15 – 2020-04-17 (×4): 2 g via INTRAVENOUS
  Filled 2020-04-15 (×4): qty 2

## 2020-04-15 MED ORDER — ALBUTEROL SULFATE (2.5 MG/3ML) 0.083% IN NEBU: 2.5000 mg | INHALATION_SOLUTION | RESPIRATORY_TRACT | Status: DC | PRN

## 2020-04-15 MED ORDER — METRONIDAZOLE IN NACL 5-0.79 MG/ML-% IV SOLN
500.0000 mg | Freq: Once | INTRAVENOUS | Status: AC
  Administered 2020-04-15: 500 mg via INTRAVENOUS
  Filled 2020-04-15: qty 100

## 2020-04-15 MED ORDER — LORAZEPAM 1 MG PO TABS: 1.0000 mg | ORAL_TABLET | ORAL | Status: DC | PRN

## 2020-04-15 MED ORDER — ACETAMINOPHEN 650 MG RE SUPP: 650.0000 mg | Freq: Four times a day (QID) | RECTAL | Status: DC | PRN

## 2020-04-15 MED ORDER — SODIUM CHLORIDE 0.9 % IV SOLN
75.0000 mL/h | INTRAVENOUS | Status: DC
  Administered 2020-04-15: 75 mL/h via INTRAVENOUS

## 2020-04-15 MED ORDER — FOLIC ACID 1 MG PO TABS
1.0000 mg | ORAL_TABLET | Freq: Every day | ORAL | Status: DC
  Administered 2020-04-17 – 2020-05-09 (×23): 1 mg via ORAL
  Filled 2020-04-15 (×23): qty 1

## 2020-04-15 NOTE — ED Notes (Signed)
Tried to get pt vitals. Pt was asleep and refused.

## 2020-04-15 NOTE — ED Notes (Signed)
MRI reported to this RN pt would not hold still & was "yelling & screaming that he wanted to get his clothes on" ... when EDP gave Ativan order & this RN made it back to MRI to give the ordered dose he had already been brought back to the unit. Pt is now eating another sandwich while waiting for a re-scan.

## 2020-04-15 NOTE — ED Notes (Signed)
MRI called this RN and reports pt remains uncooperative in MRI, unwilling to participate so MRI can be completed. This RN provided IV Ativan and verbal explanation prior to pt being transported to scan, pt verbalized understanding at time. Previous RN made this RN aware in report pt unable to receive initial MRI due to pt being uncooperative and scan not being completed. [place], MD to be made aware.

## 2020-04-15 NOTE — H&P (Signed)
Andrew Chaney SAY:[id number] DOB: 01-04-1956 DOA: 04/15/2020    PCP: Patient, No Pcp Per (Inactive)   Outpatient Specialists:  Patient arrived to ER on 04/15/20 at 0936 Referred by Attending Gareth Morgan, MD   Patient coming from: Homeless  Chief Complaint:  Chief Complaint  Patient presents with  . body pains    HPI: Andrew Chaney is a 65 y.o. male with medical history significant of bipolar disorder homelessness recent calcaneus fracture, history of MSSA bacteremia, cannabis abuse, seizure disorder, psychosis, chronic hepatitis C Presented with left foot pain, found on the side of the road by [employer] reporting multiple body aches was diagnoses with a left calcaneal fracture several months ago continued to have pain and swelling of left foot, homeless Reports fatigue for several months no chest pain or headaches no shortness of breath  History of MSSA bacteremia in the past had a TEE study done in 2021 which was unremarkable Reports right hip ulcerated where he rubbed it against concrete Reports he smokes Drinks occasionally and states last alcohol drink was 2 weeks ago    Initial COVID TEST  NEGATIVE   Lab Results  Component Value Date   [place] 04/15/2020   [place] NEGATIVE 03/23/2020   [place] NEGATIVE 07/26/2019   [place] NOT DETECTED 09/03/2018     Regarding pertinent Chronic problems:     History of seizure disorder supposed to be on Keppra and Dilantin     While in ER:  possible osteomyelitis on X-ray. MRI ordered  Meeting Sepsis criteria Started on Vanc cefepime and metronidazole   ED Triage Vitals  Enc Vitals Group     BP 04/15/20 0939 111/69     Pulse Rate 04/15/20 0939 (!) 108     Resp 04/15/20 0939 16     Temp 04/15/20 0939 98.9 F (37.2 C)     Temp Source 04/15/20 0939 Oral     SpO2 04/15/20 0939 97 %     Weight 04/15/20 1800 156 lb 8.4 oz (71 kg)     Height 04/15/20 1800 6' (1.829 m)     Head Circumference --       Peak Flow --      Pain Score 04/15/20 0945 Asleep     Pain Loc --      Pain Edu? --      Excl. in [place]? --   TMAX(24)@     _________________________________________ Significant initial  Findings: Abnormal Labs Reviewed  BASIC METABOLIC PANEL - Abnormal; Notable for the following components:      Result Value   Sodium 134 (*)    Glucose, Bld 110 (*)    Calcium 8.8 (*)    All other components within normal limits  CBC WITH DIFFERENTIAL/PLATELET - Abnormal; Notable for the following components:   WBC 15.2 (*)    RBC 3.48 (*)    Hemoglobin 11.3 (*)    HCT 33.8 (*)    Neutro Abs 12.7 (*)    Monocytes Absolute 1.6 (*)    Abs Immature Granulocytes 0.18 (*)    All other components within normal limits   ____________________________________________ Ordered Left foot concerning for  osteomyelitis _________________________   ECG: Ordered    ____________________ This patient meets SIRS Criteria that a little   The recent clinical data is shown below. Vitals:   04/15/20 0939 04/15/20 1327 04/15/20 1705 04/15/20 1800  BP: 111/69 98/64 106/60   Pulse: (!) 108 92 (!) 115   Resp: 16 16 20    Temp:  98.9 F (37.2 C)  (!) 102.5 F (39.2 C)   TempSrc: Oral  Oral   SpO2: 97% 98% 95%   Weight:    71 kg  Height:    6' (1.829 m)  Reach Ms. Powell but I guess  WBC     Component Value Date/Time   WBC 15.2 (H) 04/15/2020 1151   LYMPHSABS 0.8 04/15/2020 1151   MONOABS 1.6 (H) 04/15/2020 1151   EOSABS 0.0 04/15/2020 1151   BASOSABS 0.0 04/15/2020 1151      Lactic Acid, Venous    Component Value Date/Time   LATICACIDVEN 1.9 04/15/2020 1734       not ordered    Results for orders placed or performed during the hospital encounter of 04/15/20  Resp Panel by RT-PCR (Flu A&B, Covid) Nasopharyngeal Swab     Status: None   Collection Time: 04/15/20  6:26 PM   Specimen: Nasopharyngeal Swab; Nasopharyngeal(NP) swabs in vial transport medium  Result Value Ref Range Status   SARS  Coronavirus 2 by RT PCR NEGATIVE NEGATIVE Final         Influenza A by PCR NEGATIVE NEGATIVE Final   Influenza B by PCR NEGATIVE NEGATIVE Final           _______________________________________________________ ER Provider Called:   Orthopedics  Dr.Varkey  They Recommend admit to medicine for IV antibiotics Will see in AM   _______________________________________________ Hospitalist was called for admission for osteomyelitis  The following Work up has been ordered so far:  Orders Placed This Encounter  Procedures  . Blood culture (routine x 2)  . Resp Panel by RT-PCR (Flu A&B, Covid) Nasopharyngeal Swab  . DG Foot Complete Left  . MR FOOT LEFT W WO CONTRAST  . DG Chest Portable 1 View  . Basic metabolic panel  . CBC with Differential  . Lactic acid, plasma  . Hepatic function panel  . Urinalysis, Routine w reflex microscopic  . Sedimentation rate  . C-reactive protein  . Vital signs  . vancomycin per pharmacy consult  . CeFEPIme (MAXIPIME) per pharmacy consult  . Consult for [facility] Admission  . Consult to orthopedic surgery  . Airborne and Contact precautions     Following Medications were ordered in ER: Medications  LORazepam (ATIVAN) injection 0.5 mg (has no administration in time range)  metroNIDAZOLE (FLAGYL) IVPB 500 mg (500 mg Intravenous New Bag/Given 04/15/20 1812)  ceFEPIme (MAXIPIME) 2 g in sodium chloride 0.9 % 100 mL IVPB (has no administration in time range)  vancomycin (VANCOREADY) IVPB 1500 mg/300 mL (has no administration in time range)    Followed by  vancomycin (VANCOREADY) IVPB 1000 mg/200 mL (has no administration in time range)  acetaminophen (TYLENOL) tablet 650 mg (650 mg Oral Given 04/15/20 1023)  acetaminophen (TYLENOL) tablet 650 mg (650 mg Oral Given 04/15/20 1811)  sodium chloride 0.9 % bolus 1,000 mL (1,000 mLs Intravenous New Bag/Given 04/15/20 1813)      OTHER Significant initial  Findings:  labs showing:    Recent  Labs  Lab 04/08/20 1950 04/09/20 1233 04/15/20 1151  NA 136 138 134*  K 3.7 4.5 3.5  CO2 25 24 26   GLUCOSE 103* 104* 110*  BUN 16 21 13   CREATININE 0.76 1.04 0.84  CALCIUM 9.2 9.9 8.8*    Cr  stable,   Lab Results  Component Value Date   CREATININE 0.84 04/15/2020   CREATININE 1.04 04/09/2020   CREATININE 0.76 04/08/2020    Recent Labs  Lab 04/09/20 1233  AST 27  ALT 19  ALKPHOS 79  BILITOT 1.1  PROT 7.0  ALBUMIN 3.8   Lab Results  Component Value Date   CALCIUM 8.8 (L) 04/15/2020   PHOS 3.2 08/02/2019          Plt: Lab Results  Component Value Date   PLT 261 04/15/2020         Recent Labs  Lab 04/08/20 1950 04/09/20 1233 04/15/20 1151  WBC 8.1 7.7 15.2*  NEUTROABS 5.2 5.5 12.7*  HGB 13.3 13.5 11.3*  HCT 39.4 42.0 33.8*  MCV 96.8 99.5 97.1  PLT 373 367 261    HG/HCT  Stable,     Component Value Date/Time   HGB 11.3 (L) 04/15/2020 1151   HCT 33.8 (L) 04/15/2020 1151   MCV 97.1 04/15/2020 1151    BNP (last 3 results) Recent Labs    09/15/19 1023  BNP 57.8          Cultures:    Component Value Date/Time   SDES BLOOD RIGHT HAND 08/01/2019 0833   SDES BLOOD RIGHT HAND 08/01/2019 0833   SPECREQUEST  08/01/2019 0833    BOTTLES DRAWN AEROBIC ONLY Blood Culture adequate volume   SPECREQUEST  08/01/2019 0833    BOTTLES DRAWN AEROBIC ONLY Blood Culture results may not be optimal due to an inadequate volume of blood received in culture bottles   CULT  08/01/2019 0833    NO GROWTH 5 DAYS Performed at [facility], [place] [address], [place], [place] [address]    CULT  08/01/2019 [phone]    NO GROWTH 5 DAYS Performed at [facility], [place] [address], [place], [place] [address]    REPTSTATUS 08/06/2019 FINAL 08/01/2019 5726   REPTSTATUS 08/06/2019 FINAL 08/01/2019 2035     Radiological Exams on Admission: DG Foot Complete Left  Result Date: 04/15/2020 CLINICAL DATA:  Left heel pain. EXAM: LEFT FOOT - COMPLETE 3+ VIEW COMPARISON:   March 30, 2020. FINDINGS: Increased lucency is seen involving the bone fragments of the comminuted fracture involving the posterior calcaneus noted on prior exam. This is concerning for lytic destruction, most likely due to osteomyelitis. Increased soft tissue swelling is noted superior to the posterior calcaneus suggesting inflammation. IMPRESSION: Increased lucency is seen involving the fragments of the comminuted fracture involving the posterior calcaneus noted on prior exam. This is concerning for osteomyelitis and MRI is recommended for further evaluation. Electronically Signed   By: Marijo Conception M.D.   On: 04/15/2020 11:00   _______________________________________________________________________________________________________ Latest  Blood pressure 106/60, pulse (!) 115, temperature (!) 102.5 F (39.2 C), temperature source Oral, resp. rate 20, height 6' (1.829 m), weight 71 kg, SpO2 95 %.   Review of Systems:    Pertinent positives include:   chills, fatigue, pain in left foot  Constitutional:  No weight loss, night sweats, Fevers,  weight loss  HEENT:  No headaches, Difficulty swallowing,Tooth/dental problems,Sore throat,  No sneezing, itching, ear ache, nasal congestion, post nasal drip,  Cardio-vascular:  No chest pain, Orthopnea, PND, anasarca, dizziness, palpitations.no Bilateral lower extremity swelling  GI:  No heartburn, indigestion, abdominal pain, nausea, vomiting, diarrhea, change in bowel habits, loss of appetite, melena, blood in stool, hematemesis Resp:  no shortness of breath at rest. No dyspnea on exertion, No excess mucus, no productive cough, No non-productive cough, No coughing up of blood.No change in color of mucus.No wheezing. Skin:  no rash or lesions. No jaundice GU:  no dysuria, change in color of  urine, no urgency or frequency. No straining to urinate.  No flank pain.  Musculoskeletal:  No joint pain or no joint swelling. No decreased range of motion.  No back pain.  Psych:  No change in mood or affect. No depression or anxiety. No memory loss.  Neuro: no localizing neurological complaints, no tingling, no weakness, no double vision, no gait abnormality, no slurred speech, no confusion  All systems reviewed and apart from [place] all are negative _______________________________________________________________________________________________ Past Medical History:   Past Medical History:  Diagnosis Date  . Bipolar 1 disorder ([place])   . 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 Renal disorder       Past Surgical History:  Procedure Laterality Date  . TEE WITHOUT CARDIOVERSION N/A 08/03/2019   Procedure: TRANSESOPHAGEAL ECHOCARDIOGRAM (TEE);  Surgeon: Pixie Casino, MD;  Location: [facility] ENDOSCOPY;  Service: Cardiovascular;  Laterality: N/A;    Social History:  Ambulatory   Cane trouble walking     reports that he has been smoking cigarettes. He has been smoking about 0.50 packs per day. He has never used smokeless tobacco. He reports current alcohol use. He reports current drug use. Drug: Marijuana.     Family History:   Family History  Problem Relation Age of Onset  . Dementia Mother    ______________________________________________________________________________________________ Allergies: No Known Allergies   Prior to Admission medications   Medication Sig Start Date End Date Taking? Authorizing Provider  furosemide (LASIX) 20 MG tablet Take 1 tablet (20 mg total) by mouth daily. 03/31/20   Drenda Freeze, MD  hydrOXYzine (ATARAX/VISTARIL) 25 MG tablet Take 1 tablet (25 mg total) by mouth at bedtime as needed (insomnia). Patient not taking: No sig reported 03/23/20   Patrecia Pour, NP  hydrOXYzine (ATARAX/VISTARIL) 25 MG tablet TAKE 1 TABLET (25 MG TOTAL) BY MOUTH AT BEDTIME AS NEEDED (INSOMNIA). 03/23/20 03/23/21  Patrecia Pour, NP  levETIRAcetam (KEPPRA) 500 MG tablet Take 1 tablet (500 mg total) by mouth 2 (two)  times daily. Patient not taking: Reported on 03/23/2020 08/07/19 09/06/19  Andrew Au, MD  phenytoin (DILANTIN) 200 MG ER capsule Take 1 capsule (200 mg total) by mouth 2 (two) times daily. Patient not taking: Reported on 03/23/2020 08/07/19 09/06/19  Andrew Au, MD    ___________________________________________________________________________________________________ Physical Exam: Vitals with BMI 04/15/2020 04/15/2020 04/15/2020  Height 6' 0"  - -  Weight 156 lbs 8 oz - -  BMI 62.70 - -  Systolic - 350 98  Diastolic - 60 64  Pulse - 115 92   1. General:  in No  Acute distress   Chronically ill disheveled-appearing 2. Psychological: Alert and  Oriented 3. Head/ENT:    Dry Mucous Membranes                          Head Non traumatic, neck supple                           Poor Dentition 4. SKIN:   decreased Skin turgor,  Skin clean Dry and intact no rash 5. Heart: Regular rate and rhythm no   Murmur, no Rub or gallop 6. Lungs: no wheezes or crackles   7. Abdomen: Soft, non-tender, Non distended bowel sounds present 8. Lower extremities: no clubbing, cyanosis,   Edema left foot and leg 9. Neurologically Grossly intact, moving all 4 extremities equally  10. MSK: Normal  range of motion    Chart has been reviewed  ______________________________________________________________________________________________  Assessment/Plan   65 y.o. male with medical history significant of bipolar disorder homelessness recent calcaneus fracture, history of MSSA bacteremia, cannabis abuse, seizure disorder, psychosis, chronic hepatitis C   Admitted for suspected osteomyelitis Of left foot  Present on Admission: . Hyponatremia obtain urine electrolytes and gently rehydrate  . Chronic hepatitis C (HCC) check LFTs   . Bipolar disorder, most recent episode manic ([place]) -patient not taking home medications  . Osteomyelitis [facility]) orthopedic surgery is aware make n.p.o. postmidnight continue IV antibiotics  patient refused MRI twice Cellulitis protocol order  . Sepsis ([place]) -   -SIRS criteria met with elevated white blood cell count,       Component Value Date/Time   WBC 15.2 (H) 04/15/2020 1151   LYMPHSABS 0.8 04/15/2020 1151    tachycardia   ,   Fever  Today's Vitals   04/15/20 1327 04/15/20 1705 04/15/20 1800 04/15/20 2245  BP: 98/64 106/60  (!) 101/58  Pulse: 92 (!) 115  (!) 114  Resp: 16 20  19   Temp:  (!) 102.5 F (39.2 C)    TempSrc:  Oral    SpO2: 98% 95%  93%  Weight:   71 kg   Height:   6' (1.829 m)   PainSc:         This patient meets SIRS Criteria       -Most likely source being:osteomyeltis,       - Obtain serial lactic acid and procalcitonin level.  - Initiated IV antibiotics  Cefepime 4/5 >> Vancomycin 4/5 >>  4/5 Bcx: sent   - await results of blood and urine culture  - Rehydrate aggressively     11:13 PM  History of seizure disorder -not taking Keppra or Dilantin will need to speak about importance of taking his home medications  Right thigh ulcer -wound care consult  Alcohol abuse order CIWA protocol Other plan as per orders.  DVT prophylaxis:  SCD         Code Status:    Code Status: Prior FULL CODE as per patient  I had personally discussed CODE STATUS with patient     Family Communication:   Family not at  Bedside   Disposition Plan:   To home once workup is complete and patient is stable   Following barriers for discharge:                            Electrolytes corrected                                                             Pain controlled with PO medications                               white count improving able to transition to PO antibiotics                             Will need to be able to tolerate PO  Will likely need home health                            Will need consultants to evaluate patient prior to discharge                       Would benefit from PT/OT eval prior to DC  Ordered                                       Transition of care consulted                   Nutrition    consulted                             Consults called:  Orthopedics are aware  Admission status:  ED Disposition    ED Disposition Condition [place]: [place] [100100]  Level of Care: Progressive [102]  Admit to Progressive based on following criteria: MULTISYSTEM THREATS such as stable sepsis, metabolic/electrolyte imbalance with or without encephalopathy that is responding to early treatment.  May admit patient to [facility] or [facility] if equivalent level of care is available:: No  Covid Evaluation: Confirmed COVID Negative  Diagnosis: Osteomyelitis [facility]) [[id number]]  Admitting Physician: Toy Baker [[id number]]  Attending Physician: Toy Baker [[id number]]  Estimated length of stay: past midnight tomorrow  Certification:: I certify this patient will need inpatient services for at least 2 midnights         inpatient     I Expect 2 midnight stay secondary to severity of patient's current illness need for inpatient interventions justified by the following:  hemodynamic instability despite optimal treatment (tachycardia    Severe lab/radiological/exam abnormalities including:     and extensive comorbidities including:  substance abuse   That are currently affecting medical management.   I expect  patient to be hospitalized for 2 midnights requiring inpatient medical care.  Patient is at high risk for adverse outcome (such as loss of life or disability) if not treated.  Indication for inpatient stay as follows:  Severe change from baseline regarding mental status Hemodynamic instability despite maximal medical therapy,    severe pain requiring acute inpatient management,  inability to maintain oral hydration    Need for operative/procedural  intervention    Need for IV antibiotics, IV fluids     Level of care  Progressive  tele indefinitely please discontinue once patient no longer qualifies COVID-19 Labs    Lab Results  Component Value Date   [place] NEGATIVE 04/15/2020     Precautions: admitted as  Covid Negative  lt   PPE: Used by the provider:   N95  eye Goggles,  Gloves       [place] 04/15/2020, 11:19 PM    [facility]     after 2 AM please page floor coverage PA If 7AM-7PM, please contact the day team taking care of the patient using [email or web address]   Patient was evaluated in the context of the global COVID-19 pandemic, which necessitated consideration that the patient might be at risk for infection with the SARS-CoV-2 virus that causes COVID-19. Institutional protocols and algorithms that pertain to the evaluation of patients at risk for COVID-19 are in a  state of rapid change based on information released by regulatory bodies including the CDC and federal and state organizations. These policies and algorithms were followed during the patient's care.

## 2020-04-15 NOTE — ED Notes (Signed)
Patient transported to X-ray 

## 2020-04-15 NOTE — Progress Notes (Addendum)
Pharmacy Antibiotic Note  Andrew Chaney is a 65 y.o. male admitted on 04/15/2020 with several months of generalized body aches and fatigue. He was diagnoses with a left calcaneal fracture several months ago which is now concerning for possible osteomyelitis on X-ray. MRI ordered but not yet able to obtained. Pharmacy has been consulted for vancomycin and cefepime dosing.  Plan: Vancomycin 1500mg  IV x1, then 1000mg  IV q12h  Cefepime 2g IV q8h - Monitor clinical improvement, LOT, de-escalation - Monitor renal function - F/u MRI if able to obtain   Height: 6' (182.9 cm) Weight: 71 kg (156 lb 8.4 oz) IBW/kg (Calculated) : 77.6  Temp (24hrs), Avg:100.7 F (38.2 C), Min:98.9 F (37.2 C), Max:102.5 F (39.2 C)  Recent Labs  Lab 04/08/20 1950 04/09/20 1233 04/15/20 1151  WBC 8.1 7.7 15.2*  CREATININE 0.76 1.04 0.84    Estimated Creatinine Clearance: 91.6 mL/min (by C-G formula based on SCr of 0.84 mg/dL).    No Known Allergies  Antimicrobials this admission: Cefepime 4/5 >> Vancomycin 4/5 >>   Microbiology results: 4/5 Bcx: sent   Thank you for allowing pharmacy to be a part of this patient's care.  Claudina Lick, PharmD PGY1 Acute Care Pharmacy Resident 04/15/2020 6:26 PM  Please check [email or web address] for unit-specific pharmacy phone numbers.

## 2020-04-15 NOTE — ED Provider Notes (Signed)
[facility]ERGENCY DEPARTMENT Provider Note   CSN: [id number] Arrival date & time: 04/15/20  5427     History Chief Complaint  Patient presents with  . body pains    Andrew Chaney is a 65 y.o. male with a past medical history of bipolar disorder, presenting to the ED with a chief complaint of generalized body aches.  States that he has been having generalized body aches, fatigue for several months.  He was diagnosed with a calcaneal fracture on the left side several months ago and has been having trouble ambulating since then.  He has not been taking anything for pain.  States that what made him come in today was that the police found him on the pavement and told him that he needed to leave but he refused to do so.  He denies any chest pain, headache, vision changes, numbness or additional injuries.  HPI     Past Medical History:  Diagnosis Date  . Bipolar 1 disorder ([place])   . [facility]) 2011   Germ Cell [place]   . Renal disorder     Patient Active Problem List   Diagnosis Date Noted  . Insomnia 03/23/2020  . Dupuytren's contracture 08/05/2019  . MSSA bacteremia 07/31/2019  . Altered mental status   . Fever 07/30/2019  . Non-purulent Cellulitis of right upper arm 07/30/2019  . 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 Seizure ([place]) 07/26/2019  . Status epilepticus ([place]) 07/26/2019  . Psychosis ([place]) 09/05/2018  . Bipolar disorder, most recent episode manic ([place]) 04/11/2018  . Bipolar I disorder, current or most recent episode manic, with psychotic features ([place]) 04/08/2018  . Confusion 01/03/2015  . Chronic hepatitis C ([place]) 12/20/2012  . Renal failure 12/17/2012  . Acute encephalopathy 12/17/2012  . ARF (acute renal failure) ([place]) 12/17/2012  . Hyponatremia 12/17/2012  . CAP (community acquired pneumonia) 12/17/2012  . Rhabdomyolysis 12/17/2012  . 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[place]) 12/17/2012    Past  Surgical History:  Procedure Laterality Date  . TEE WITHOUT CARDIOVERSION N/A 08/03/2019   Procedure: TRANSESOPHAGEAL ECHOCARDIOGRAM (TEE);  Surgeon: Pixie Casino, MD;  Location[facility] ENDOSCOPY;  Service: Cardiovascular;  Laterality: N/A;       Family History  Problem Relation Age of Onset  . Dementia Mother     Social History   Tobacco Use  . Smoking status: Current Every Day Smoker    Packs/day: 0.50    Types: Cigarettes  . Smokeless tobacco: Never Used  Vaping Use  . Vaping Use: Never used  Substance Use Topics  . Alcohol use: Yes  . Drug use: Yes    Types: Marijuana    Comment: States no longer uses marijuana because he is "on probation"    Home Medications Prior to Admission medications   Medication Sig Start Date End Date Taking? Authorizing Provider  furosemide (LASIX) 20 MG tablet Take 1 tablet (20 mg total) by mouth daily. 03/31/20   Drenda Freeze, MD  hydrOXYzine (ATARAX/VISTARIL) 25 MG tablet Take 1 tablet (25 mg total) by mouth at bedtime as needed (insomnia). Patient not taking: No sig reported 03/23/20   Patrecia Pour, NP  hydrOXYzine (ATARAX/VISTARIL) 25 MG tablet TAKE 1 TABLET (25 MG TOTAL) BY MOUTH AT BEDTIME AS NEEDED (INSOMNIA). 03/23/20 03/23/21  Patrecia Pour, NP  levETIRAcetam (KEPPRA) 500 MG tablet Take 1 tablet (500 mg total) by mouth 2 (two) times daily. Patient not taking: Reported on 03/23/2020 08/07/19 09/06/19  Andrew Au, MD  phenytoin (DILANTIN) 200 MG ER capsule Take 1 capsule (200 mg total) by mouth 2 (two) times daily. Patient not taking: Reported on 03/23/2020 08/07/19 09/06/19  Andrew Au, MD    Allergies    Patient has no known allergies.  Review of Systems   Review of Systems  Constitutional: Negative for chills and fever.  Cardiovascular: Negative for chest pain.  Musculoskeletal: Positive for myalgias. Negative for neck pain.  Neurological: Negative for headaches.    Physical Exam Updated Vital Signs BP 98/64 (BP  Location: Left Arm)   Pulse 92   Temp 98.9 F (37.2 C) (Oral)   Resp 16   SpO2 98%   Physical Exam Vitals and nursing note reviewed.  Constitutional:      General: He is not in acute distress.    Appearance: He is well-developed. He is not diaphoretic.  HENT:     Head: Normocephalic and atraumatic.     Nose: Nose normal.  Eyes:     General: No scleral icterus.       Left eye: No discharge.     Conjunctiva/sclera: Conjunctivae normal.  Cardiovascular:     Rate and Rhythm: Normal rate and regular rhythm.     Heart sounds: Normal heart sounds. No murmur heard. No friction rub. No gallop.   Pulmonary:     Effort: Pulmonary effort is normal. No respiratory distress.     Breath sounds: Normal breath sounds.  Abdominal:     General: Bowel sounds are normal. There is no distension.     Palpations: Abdomen is soft.     Tenderness: There is no abdominal tenderness. There is no guarding.  Musculoskeletal:        General: Normal range of motion.     Cervical back: Normal range of motion and neck supple.     Right lower leg: Edema present.     Left lower leg: Edema present.  Skin:    General: Skin is warm and dry.     Findings: Erythema present. No rash.     Comments: Diffuse tenderness and some erythema noted of the left calcaneus area.  He has swelling noted as well of bilateral lower extremities.  2+ DP pulse noted bilaterally.  Neurological:     Mental Status: He is alert.     Motor: No abnormal muscle tone.     Coordination: Coordination normal.     Comments: Strength 5/5 in bilateral upper extremities.  Moving all extremities.     ED Results / Procedures / Treatments   Labs (all labs ordered are listed, but only abnormal results are displayed) Labs Reviewed  BASIC METABOLIC PANEL - Abnormal; Notable for the following components:      Result Value   Sodium 134 (*)    Glucose, Bld 110 (*)    Calcium 8.8 (*)    All other components within normal limits  CBC WITH  DIFFERENTIAL/PLATELET - Abnormal; Notable for the following components:   WBC 15.2 (*)    RBC 3.48 (*)    Hemoglobin 11.3 (*)    HCT 33.8 (*)    Neutro Abs 12.7 (*)    Monocytes Absolute 1.6 (*)    Abs Immature Granulocytes 0.18 (*)    All other components within normal limits    EKG None  Radiology DG Foot Complete Left  Result Date: 04/15/2020 CLINICAL DATA:  Left heel pain. EXAM: LEFT FOOT - COMPLETE 3+ VIEW COMPARISON:  March 30, 2020. FINDINGS:  Increased lucency is seen involving the bone fragments of the comminuted fracture involving the posterior calcaneus noted on prior exam. This is concerning for lytic destruction, most likely due to osteomyelitis. Increased soft tissue swelling is noted superior to the posterior calcaneus suggesting inflammation. IMPRESSION: Increased lucency is seen involving the fragments of the comminuted fracture involving the posterior calcaneus noted on prior exam. This is concerning for osteomyelitis and MRI is recommended for further evaluation. Electronically Signed   By: Marijo Conception M.D.   On: 04/15/2020 11:00    Procedures Procedures   Medications Ordered in ED Medications  LORazepam (ATIVAN) injection 0.5 mg (has no administration in time range)  acetaminophen (TYLENOL) tablet 650 mg (650 mg Oral Given 04/15/20 1023)    ED Course  I have reviewed the triage vital signs and the nursing notes.  Pertinent labs & imaging results that were available during my care of the patient were reviewed by me and considered in my medical decision making (see chart for details).    MDM Rules/Calculators/A&P                          65 year old male with a past medical history of bipolar disorder presenting to the ED with a chief complaint of generalized body aches.  Patient's main complaint is trouble walking since being told that he had a calcaneal fracture on the left foot several months ago.  He has not taken anything for pain.  Chart review shows that  patient was told about this fracture a few weeks ago and was offered a postop shoe but he tells me that this is not comfortable to walk in.  I asked him what brought him in today specifically as his symptoms of generalized body aches have been going on for several months.  He says that he was laying on the pavement and was told by the police to move but when he refused to do so they brought him to the ER because he complained of pain.  On exam there is bilateral lower extremity edema that appears chronic based on chart review.  He does have diffuse tenderness of the left calcaneus area with erythema.  He is moving all extremities without difficulty.  X-ray of the right foot shows increased lucency that could be concerning for osteomyelitis.  Lab work shows a leukocytosis of 15.  I have ordered an MRI of the foot to further evaluate this possible osteomyelitis.  He remains hemodynamically stable here.  Care handed off to oncoming provider pending MRI.  May need Ortho consult if there is evidence of osteomyelitis.  If there is no significant evidence of osteomyelitis, can discharge home with boot and crutches as he requested. I explained to patient that if there is no indication for admission that he will be discharged home and he can take Tylenol as needed for pain.  Portions of this note were generated with [employer]. Dictation errors may occur despite best attempts at proofreading.  Final Clinical Impression(s) / ED Diagnoses Final diagnoses:  Left foot pain    Rx / DC Orders ED Discharge Orders    None       Delia Heady, PA-C 04/15/20 [place], Alvin Critchley, DO 04/16/20 [phone]

## 2020-04-15 NOTE — Progress Notes (Signed)
Patient refused to hold still for exam.  Screaming and wanting to put clothes back on.  Talked to patient several times and could not get patient to cooperate.  Spoke to his nurse and patient sent back to ED.

## 2020-04-15 NOTE — ED Triage Notes (Signed)
Pt was found by [employer] laying on the side of the road. They called EMS & he arrived here for eval d/t body aches.

## 2020-04-15 NOTE — ED Notes (Signed)
Pt getting agitated and very impatient. Keeps looking around and asking different staff for food. Pt is aware that he can not have anything to eat till his MRI

## 2020-04-15 NOTE — ED Provider Notes (Signed)
Care assumed from [facility], [place]. See her note for full H&P.  Per her note, " Andrew Chaney is a 65 y.o. male with a past medical history of bipolar disorder, presenting to the ED with a chief complaint of generalized body aches.  States that he has been having generalized body aches, fatigue for several months.  He was diagnosed with a calcaneal fracture on the left side several months ago and has been having trouble ambulating since then.  He has not been taking anything for pain.  States that what made him come in today was that the police found him on the pavement and told him that he needed to leave but he refused to do so.  He denies any chest pain, headache, vision changes, numbness or additional injuries. "   Physical Exam  BP 106/60 (BP Location: Right Arm)   Pulse (!) 115   Temp (!) 102.5 F (39.2 C) (Oral)   Resp 20   Ht 6' (1.829 m)   Wt 71 kg   SpO2 95%   BMI 21.23 kg/m   Physical Exam Constitutional:      General: He is not in acute distress.    Appearance: He is well-developed.  Eyes:     Conjunctiva/sclera: Conjunctivae normal.  Cardiovascular:     Rate and Rhythm: Tachycardia present.  Pulmonary:     Effort: Pulmonary effort is normal.  Abdominal:     General: Abdomen is flat.  Musculoskeletal:     Comments: Edema, induration, warmth and erythema noted to the left foot/ankle area. There is significant TTP and pain with minimal ROM  Skin:    General: Skin is warm and dry.  Neurological:     Mental Status: He is alert and oriented to person, place, and time.     ED Course/Procedures     Procedures  Results for orders placed or performed during the hospital encounter of 65/78/46  Basic metabolic panel  Result Value Ref Range   Sodium 134 (L) 135 - 145 mmol/L   Potassium 3.5 3.5 - 5.1 mmol/L   Chloride 102 98 - 111 mmol/L   CO2 26 22 - 32 mmol/L   Glucose, Bld 110 (H) 70 - 99 mg/dL   BUN 13 8 - 23 mg/dL   Creatinine, Ser 0.84 0.61 - 1.24 mg/dL    Calcium 8.8 (L) 8.9 - 10.3 mg/dL   GFR, Estimated >60 >60 mL/min   Anion gap 6 5 - 15  CBC with Differential  Result Value Ref Range   WBC 15.2 (H) 4.0 - 10.5 K/uL   RBC 3.48 (L) 4.22 - 5.81 MIL/uL   Hemoglobin 11.3 (L) 13.0 - 17.0 g/dL   HCT 33.8 (L) 39.0 - 52.0 %   MCV 97.1 80.0 - 100.0 fL   MCH 32.5 26.0 - 34.0 pg   MCHC 33.4 30.0 - 36.0 g/dL   RDW 12.8 11.5 - 15.5 %   Platelets 261 150 - 400 K/uL   nRBC 0.0 0.0 - 0.2 %   Neutrophils Relative % 84 %   Neutro Abs 12.7 (H) 1.7 - 7.7 K/uL   Lymphocytes Relative 5 %   Lymphs Abs 0.8 0.7 - 4.0 K/uL   Monocytes Relative 10 %   Monocytes Absolute 1.6 (H) 0.1 - 1.0 K/uL   Eosinophils Relative 0 %   Eosinophils Absolute 0.0 0.0 - 0.5 K/uL   Basophils Relative 0 %   Basophils Absolute 0.0 0.0 - 0.1 K/uL   Immature Granulocytes 1 %  Abs Immature Granulocytes 0.18 (H) 0.00 - 0.07 K/uL  Lactic acid, plasma  Result Value Ref Range   Lactic Acid, Venous 1.9 0.5 - 1.9 mmol/L  Sedimentation rate  Result Value Ref Range   Sed Rate 38 (H) 0 - 16 mm/hr  C-reactive protein  Result Value Ref Range   CRP 19.5 (H) <1.0 mg/dL   DG Chest 2 View  Result Date: 03/22/2020 CLINICAL DATA:  Elevated white blood cell count, abdominal pain EXAM: CHEST - 2 VIEW COMPARISON:  07/30/2019 FINDINGS: Frontal and lateral views of the chest demonstrate an unremarkable cardiac silhouette. Scattered areas of subsegmental atelectasis or scarring at the lung bases. No airspace disease, effusion, or pneumothorax. No acute bony abnormality. IMPRESSION: 1. No acute intrathoracic process. Electronically Signed   By: Randa Ngo M.D.   On: 03/22/2020 18:43   CT Abdomen Pelvis W Contrast  Result Date: 03/22/2020 CLINICAL DATA:  Abdominal pain EXAM: CT ABDOMEN AND PELVIS WITH CONTRAST TECHNIQUE: Multidetector CT imaging of the abdomen and pelvis was performed using the standard protocol following bolus administration of intravenous contrast. CONTRAST:  128mL OMNIPAQUE  IOHEXOL 300 MG/ML  SOLN COMPARISON:  09/15/2019 FINDINGS: Lower chest: Bibasilar subsegmental atelectasis. Heart size within normal limits. Hepatobiliary: No focal liver abnormality is seen. No gallstones, gallbladder wall thickening, or biliary dilatation. Pancreas: 7 mm hypoattenuating lesion within the pancreatic head is not appreciably changed compared to prior CT. No pancreatic ductal dilatation or peripancreatic inflammatory changes. Spleen: Normal in size without focal abnormality. Adrenals/Urinary Tract: Unremarkable adrenal glands. Kidneys enhance symmetrically without focal lesion, stone, or hydronephrosis. Ureters are nondilated. Urinary bladder appears unremarkable. Stomach/Bowel: Stomach is within normal limits. Scattered colonic diverticulosis. No evidence of bowel wall thickening, distention, or inflammatory changes. 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 Findings appear unchanged from dedicated CT venogram dated 09/15/2019. No aortic aneurysm. No acute vascular abnormality identified. No abdominopelvic lymphadenopathy. Reproductive: Prostate is unremarkable. Other: No free fluid. No abdominopelvic fluid collection. No pneumoperitoneum. No abdominal wall hernia. Musculoskeletal: No acute osseous abnormality. Unchanged probable intraosseous hemangioma within the L4 vertebral body. No suspicious bone lesion. IMPRESSION: 1. No acute abdominopelvic findings. 2. Colonic diverticulosis without evidence of acute diverticulitis. 3. 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 Findings appear unchanged from dedicated CT venogram dated 09/15/2019. 4. Grossly stable subcentimeter hypoattenuating lesion within the pancreatic head. Follow-up with nonemergent pancreatic protocol MRI/MRCP.  Electronically Signed   By: Davina Poke D.O.   On: 03/22/2020 18:54   DG Chest Portable 1 View  Result Date: 04/09/2020 CLINICAL DATA:  Fall Altered mental status EXAM: PORTABLE CHEST 1 VIEW COMPARISON:  03/28/2020 FINDINGS: The heart size and mediastinal contours are within normal limits. Both lungs are clear. Benign-appearing chondroid lesion again seen in the left proximal humeral diaphysis. It is unchanged dating back to 07/26/2019. IMPRESSION: No acute cardiopulmonary process. Electronically Signed   By: Miachel Roux M.D.   On: 04/09/2020 12:45   DG Foot Complete Left  Result Date: 04/15/2020 CLINICAL DATA:  Left heel pain. EXAM: LEFT FOOT - COMPLETE 3+ VIEW COMPARISON:  March 30, 2020. FINDINGS: Increased lucency is seen involving the bone fragments of the comminuted fracture involving the posterior calcaneus noted on prior exam. This is concerning for lytic destruction, most likely due to osteomyelitis. Increased soft tissue swelling is noted superior to the posterior calcaneus suggesting  inflammation. IMPRESSION: Increased lucency is seen involving the fragments of the comminuted fracture involving the posterior calcaneus noted on prior exam. This is concerning for osteomyelitis and MRI is recommended for further evaluation. Electronically Signed   By: Marijo Conception M.D.   On: 04/15/2020 11:00   DG Foot Complete Left  Result Date: 03/30/2020 CLINICAL DATA:  Left foot pain and swelling EXAM: LEFT FOOT - COMPLETE 3+ VIEW COMPARISON:  None. FINDINGS: There is comminuted fragmented posterior calcaneal fracture with sclerosis seen around the fracture fragments. Overlying soft tissue swelling is seen. There is diffuse osteopenia. No other definite osseous fracture seen. Dorsal soft tissue swelling is seen. IMPRESSION: Comminuted fragmented posterior calcaneus fracture, age indeterminate. Electronically Signed   By: Prudencio Pair M.D.   On: 03/30/2020 21:48   VAS [place] LOWER EXTREMITY VENOUS (DVT)  (ONLY [facility] & [facility])  Result Date: 03/31/2020  Lower Venous DVT Study Indications: Swelling, and Pain.  Limitations: Significant calf edema. Comparison Study: No prior study on file Performing Technologist: Sharion Dove RVS  Examination Guidelines: A complete evaluation includes B-mode imaging, spectral Doppler, color Doppler, and power Doppler as needed of all accessible portions of each vessel. Bilateral testing is considered an integral part of a complete examination. Limited examinations for reoccurring indications may be performed as noted. The reflux portion of the exam is performed with the patient in reverse Trendelenburg.  +-----+---------------+---------+-----------+----------+--------------+ RIGHTCompressibilityPhasicitySpontaneityPropertiesThrombus Aging +-----+---------------+---------+-----------+----------+--------------+ CFV  Full           Yes      Yes                                 +-----+---------------+---------+-----------+----------+--------------+   +---------+---------------+---------+-----------+----------+-------------------+ LEFT     CompressibilityPhasicitySpontaneityPropertiesThrombus Aging      +---------+---------------+---------+-----------+----------+-------------------+ CFV      Full           Yes      Yes                                      +---------+---------------+---------+-----------+----------+-------------------+ SFJ      Full                                                             +---------+---------------+---------+-----------+----------+-------------------+ FV Prox  Full                                                             +---------+---------------+---------+-----------+----------+-------------------+ FV Mid   Full                                                             +---------+---------------+---------+-----------+----------+-------------------+ FV DistalFull                                                              +---------+---------------+---------+-----------+----------+-------------------+  PFV      Full                                                             +---------+---------------+---------+-----------+----------+-------------------+ POP      Full           Yes      Yes                                      +---------+---------------+---------+-----------+----------+-------------------+ PTV      Full                                                             +---------+---------------+---------+-----------+----------+-------------------+ PERO                                                  Not well visualized +---------+---------------+---------+-----------+----------+-------------------+     Summary: RIGHT: - No evidence of common femoral vein obstruction.  LEFT: - There is no evidence of deep vein thrombosis in the lower extremity. However, portions of this examination were limited- see technologist comments above.  - Ultrasound characteristics of enlarged lymph nodes noted in the groin.  *See table(s) above for measurements and observations. Electronically signed by Servando Snare MD on 03/31/2020 at 11:20:14 AM.    Final      MDM   65 y/o M presenting for heel pain. Hx of fx. At shift change, pending mri foot.   Reviewed/interpreted labs CBC with mild leukocytosis, mild anemia BMP grossly unremarkable  Pt later noted to have fever on repeat VS check. Sepsis labs were ordered, ivf and broad spectrum abx ordered. Added cxr and ua for completeness though I suspect that sxs are likely related to osteomyelitis based on exam and xray.  MRI foot was unable to be completed as pt was agitated and MRI too the patient back to the ED prior receiving any medication for sedation.   Will admit for further tx of fever likely secondary to osteomyelitis.   6:24 PM CONSULT with Dr. Roel Cluck who accepts patient for admission   7:27 PM CONSULT with Dr. Griffin Basil who  will consult on pt.   Bishop Dublin 04/15/20 1949    Gareth Morgan, MD 04/16/20 1215

## 2020-04-15 NOTE — ED Notes (Signed)
[place], MD made aware of pressure injury found during hygiene care to right hip, made aware of wound consult ordered as well. Pt to MRI at this time.

## 2020-04-16 ENCOUNTER — Inpatient Hospital Stay (HOSPITAL_COMMUNITY): Payer: Self-pay

## 2020-04-16 ENCOUNTER — Other Ambulatory Visit: Payer: Self-pay | Admitting: Physician Assistant

## 2020-04-16 ENCOUNTER — Encounter (HOSPITAL_COMMUNITY)
Admission: EM | Disposition: A | Discharge status: Skilled Nursing Facility | Payer: Self-pay | Source: Home / Self Care | Attending: Internal Medicine

## 2020-04-16 ENCOUNTER — Inpatient Hospital Stay (HOSPITAL_COMMUNITY): Payer: Self-pay | Admitting: Certified Registered"

## 2020-04-16 DIAGNOSIS — M86272 Subacute osteomyelitis, left ankle and foot: Secondary | ICD-10-CM

## 2020-04-16 DIAGNOSIS — L89219 Pressure ulcer of right hip, unspecified stage: Secondary | ICD-10-CM

## 2020-04-16 DIAGNOSIS — A4101 Sepsis due to Methicillin susceptible Staphylococcus aureus: Secondary | ICD-10-CM

## 2020-04-16 DIAGNOSIS — R6520 Severe sepsis without septic shock: Secondary | ICD-10-CM

## 2020-04-16 DIAGNOSIS — L02612 Cutaneous abscess of left foot: Secondary | ICD-10-CM

## 2020-04-16 DIAGNOSIS — L02416 Cutaneous abscess of left lower limb: Secondary | ICD-10-CM

## 2020-04-16 DIAGNOSIS — R6521 Severe sepsis with septic shock: Secondary | ICD-10-CM

## 2020-04-16 DIAGNOSIS — M86172 Other acute osteomyelitis, left ankle and foot: Secondary | ICD-10-CM | POA: Insufficient documentation

## 2020-04-16 DIAGNOSIS — I959 Hypotension, unspecified: Secondary | ICD-10-CM

## 2020-04-16 DIAGNOSIS — B9562 Methicillin resistant Staphylococcus aureus infection as the cause of diseases classified elsewhere: Secondary | ICD-10-CM

## 2020-04-16 DIAGNOSIS — F3110 Bipolar disorder, current episode manic without psychotic features, unspecified: Secondary | ICD-10-CM

## 2020-04-16 DIAGNOSIS — A4102 Sepsis due to Methicillin resistant Staphylococcus aureus: Principal | ICD-10-CM

## 2020-04-16 DIAGNOSIS — A419 Sepsis, unspecified organism: Secondary | ICD-10-CM

## 2020-04-16 DIAGNOSIS — E43 Unspecified severe protein-calorie malnutrition: Secondary | ICD-10-CM

## 2020-04-16 DIAGNOSIS — B182 Chronic viral hepatitis C: Secondary | ICD-10-CM

## 2020-04-16 HISTORY — PX: AMPUTATION: SHX166

## 2020-04-16 LAB — BLOOD CULTURE ID PANEL (REFLEXED) - BCID2

## 2020-04-16 LAB — CBC WITH DIFFERENTIAL/PLATELET
Abs Immature Granulocytes: 0.27 10*3/uL — ABNORMAL HIGH (ref 0.00–0.07)
Basophils Absolute: 0 10*3/uL (ref 0.0–0.1)
Basophils Relative: 0 %
Eosinophils Absolute: 0 10*3/uL (ref 0.0–0.5)
Eosinophils Relative: 0 %
HCT: 31.1 % — ABNORMAL LOW (ref 39.0–52.0)
Hemoglobin: 10.3 g/dL — ABNORMAL LOW (ref 13.0–17.0)
Immature Granulocytes: 2 %
Lymphocytes Relative: 5 %
Lymphs Abs: 0.9 10*3/uL (ref 0.7–4.0)
MCH: 32.3 pg (ref 26.0–34.0)
MCHC: 33.1 g/dL (ref 30.0–36.0)
MCV: 97.5 fL (ref 80.0–100.0)
Monocytes Absolute: 1.9 10*3/uL — ABNORMAL HIGH (ref 0.1–1.0)
Monocytes Relative: 11 %
Neutro Abs: 14.4 10*3/uL — ABNORMAL HIGH (ref 1.7–7.7)
Neutrophils Relative %: 82 %
Platelets: 274 10*3/uL (ref 150–400)
RBC: 3.19 MIL/uL — ABNORMAL LOW (ref 4.22–5.81)
RDW: 12.9 % (ref 11.5–15.5)
WBC: 17.5 10*3/uL — ABNORMAL HIGH (ref 4.0–10.5)
nRBC: 0 % (ref 0.0–0.2)

## 2020-04-16 LAB — POCT I-STAT 7, (LYTES, BLD GAS, ICA,H+H)
Acid-base deficit: 4 mmol/L — ABNORMAL HIGH (ref 0.0–2.0)
Bicarbonate: 21.5 mmol/L (ref 20.0–28.0)
Calcium, Ion: 1.2 mmol/L (ref 1.15–1.40)
HCT: 29 % — ABNORMAL LOW (ref 39.0–52.0)
Hemoglobin: 9.9 g/dL — ABNORMAL LOW (ref 13.0–17.0)
O2 Saturation: 100 %
Patient temperature: 35.5
Potassium: 3.7 mmol/L (ref 3.5–5.1)
Sodium: 135 mmol/L (ref 135–145)
TCO2: 23 mmol/L (ref 22–32)
pCO2 arterial: 37.1 mmHg (ref 32.0–48.0)
pH, Arterial: 7.364 (ref 7.350–7.450)
pO2, Arterial: 192 mmHg — ABNORMAL HIGH (ref 83.0–108.0)

## 2020-04-16 LAB — HEPATIC FUNCTION PANEL
ALT: 10 U/L (ref 0–44)
AST: 14 U/L — ABNORMAL LOW (ref 15–41)
Albumin: 1.8 g/dL — ABNORMAL LOW (ref 3.5–5.0)
Alkaline Phosphatase: 53 U/L (ref 38–126)
Bilirubin, Direct: 0.3 mg/dL — ABNORMAL HIGH (ref 0.0–0.2)
Indirect Bilirubin: 0.5 mg/dL (ref 0.3–0.9)
Total Bilirubin: 0.8 mg/dL (ref 0.3–1.2)
Total Protein: 4.7 g/dL — ABNORMAL LOW (ref 6.5–8.1)

## 2020-04-16 LAB — TYPE AND SCREEN
ABO/RH(D): A NEG
Antibody Screen: NEGATIVE

## 2020-04-16 LAB — COMPREHENSIVE METABOLIC PANEL
ALT: 10 U/L (ref 0–44)
AST: 14 U/L — ABNORMAL LOW (ref 15–41)
Albumin: 1.8 g/dL — ABNORMAL LOW (ref 3.5–5.0)
Alkaline Phosphatase: 51 U/L (ref 38–126)
Anion gap: 6 (ref 5–15)
BUN: 16 mg/dL (ref 8–23)
CO2: 22 mmol/L (ref 22–32)
Calcium: 7.8 mg/dL — ABNORMAL LOW (ref 8.9–10.3)
Chloride: 104 mmol/L (ref 98–111)
Creatinine, Ser: 0.87 mg/dL (ref 0.61–1.24)
GFR, Estimated: >60 mL/min (ref >60)
Glucose, Bld: 105 mg/dL — ABNORMAL HIGH (ref 70–99)
Potassium: 3.2 mmol/L — ABNORMAL LOW (ref 3.5–5.1)
Sodium: 132 mmol/L — ABNORMAL LOW (ref 135–145)
Total Bilirubin: 0.8 mg/dL (ref 0.3–1.2)
Total Protein: 4.5 g/dL — ABNORMAL LOW (ref 6.5–8.1)

## 2020-04-16 LAB — PROTIME-INR
INR: 1.4 — ABNORMAL HIGH (ref 0.8–1.2)
Prothrombin Time: 17 seconds — ABNORMAL HIGH (ref 11.4–15.2)

## 2020-04-16 LAB — IRON AND TIBC
Iron: 13 ug/dL — ABNORMAL LOW (ref 45–182)
Saturation Ratios: 9 % — ABNORMAL LOW (ref 17.9–39.5)
TIBC: 140 ug/dL — ABNORMAL LOW (ref 250–450)
UIBC: 127 ug/dL

## 2020-04-16 LAB — PROCALCITONIN: Procalcitonin: 0.94 ng/mL

## 2020-04-16 LAB — URINALYSIS, ROUTINE W REFLEX MICROSCOPIC
Bacteria, UA: NONE SEEN
Bilirubin Urine: NEGATIVE
Glucose, UA: NEGATIVE mg/dL
Hgb urine dipstick: NEGATIVE
Ketones, ur: NEGATIVE mg/dL
Leukocytes,Ua: NEGATIVE
Nitrite: NEGATIVE
Protein, ur: 30 mg/dL — AB
Specific Gravity, Urine: 1.031 — ABNORMAL HIGH (ref 1.005–1.030)
pH: 5 (ref 5.0–8.0)

## 2020-04-16 LAB — CK: Total CK: 84 U/L (ref 49–397)

## 2020-04-16 LAB — SODIUM, URINE, RANDOM: Sodium, Ur: <10 mmol/L

## 2020-04-16 LAB — RAPID URINE DRUG SCREEN, HOSP PERFORMED
Amphetamines: NOT DETECTED
Barbiturates: NOT DETECTED
Benzodiazepines: NOT DETECTED
Cocaine: NOT DETECTED
Opiates: NOT DETECTED
Tetrahydrocannabinol: NOT DETECTED

## 2020-04-16 LAB — FERRITIN: Ferritin: 242 ng/mL (ref 24–336)

## 2020-04-16 LAB — LACTIC ACID, PLASMA: Lactic Acid, Venous: 2 mmol/L (ref 0.5–1.9)

## 2020-04-16 LAB — TSH: TSH: 1.329 u[IU]/mL (ref 0.350–4.500)

## 2020-04-16 LAB — ETHANOL: Alcohol, Ethyl (B): <10 mg/dL (ref <10)

## 2020-04-16 LAB — CREATININE, URINE, RANDOM: Creatinine, Urine: 202.65 mg/dL

## 2020-04-16 LAB — VITAMIN B12: Vitamin B-12: 52 pg/mL — ABNORMAL LOW (ref 180–914)

## 2020-04-16 LAB — MRSA PCR SCREENING: MRSA by PCR: NEGATIVE

## 2020-04-16 LAB — RETICULOCYTES
Immature Retic Fract: 5.9 % (ref 2.3–15.9)
RBC.: 3.18 MIL/uL — ABNORMAL LOW (ref 4.22–5.81)
Retic Count, Absolute: 16.5 10*3/uL — ABNORMAL LOW (ref 19.0–186.0)
Retic Ct Pct: 0.5 % (ref 0.4–3.1)

## 2020-04-16 LAB — OSMOLALITY, URINE: Osmolality, Ur: 855 mOsm/kg (ref 300–900)

## 2020-04-16 LAB — MAGNESIUM: Magnesium: 1.4 mg/dL — ABNORMAL LOW (ref 1.7–2.4)

## 2020-04-16 LAB — PHOSPHORUS: Phosphorus: 1.9 mg/dL — ABNORMAL LOW (ref 2.5–4.6)

## 2020-04-16 LAB — OSMOLALITY: Osmolality: 280 mOsm/kg (ref 275–295)

## 2020-04-16 LAB — FOLATE: Folate: 11 ng/mL (ref >5.9)

## 2020-04-16 LAB — ABO/RH: ABO/RH(D): A NEG

## 2020-04-16 SURGERY — AMPUTATION BELOW KNEE
Anesthesia: General | Site: Knee | Laterality: Left

## 2020-04-16 MED ORDER — OXYCODONE HCL 5 MG PO TABS: 5.0000 mg | ORAL_TABLET | ORAL | Status: DC | PRN

## 2020-04-16 MED ORDER — MIDAZOLAM HCL 2 MG/2ML IJ SOLN
INTRAMUSCULAR | Status: AC
  Filled 2020-04-16: qty 2

## 2020-04-16 MED ORDER — ALBUMIN HUMAN 5 % IV SOLN: INTRAVENOUS | Status: DC | PRN

## 2020-04-16 MED ORDER — LACTATED RINGERS IV BOLUS
500.0000 mL | Freq: Once | INTRAVENOUS | Status: AC
  Administered 2020-04-16: 500 mL via INTRAVENOUS

## 2020-04-16 MED ORDER — DOCUSATE SODIUM 100 MG PO CAPS
100.0000 mg | ORAL_CAPSULE | Freq: Every day | ORAL | Status: DC
  Administered 2020-04-17 – 2020-05-06 (×18): 100 mg via ORAL
  Filled 2020-04-16 (×20): qty 1

## 2020-04-16 MED ORDER — SODIUM CHLORIDE 0.9 % IV BOLUS
1000.0000 mL | Freq: Once | INTRAVENOUS | Status: AC
  Administered 2020-04-16: 1000 mL via INTRAVENOUS

## 2020-04-16 MED ORDER — ACETAMINOPHEN 325 MG PO TABS
325.0000 mg | ORAL_TABLET | Freq: Four times a day (QID) | ORAL | Status: DC | PRN
Start: 2020-04-17 — End: 2020-04-27
  Administered 2020-04-22 – 2020-04-23 (×2): 650 mg via ORAL
  Filled 2020-04-16 (×2): qty 2

## 2020-04-16 MED ORDER — MAGNESIUM SULFATE 2 GM/50ML IV SOLN: 2.0000 g | Freq: Every day | INTRAVENOUS | Status: DC | PRN

## 2020-04-16 MED ORDER — CYANOCOBALAMIN 1000 MCG/ML IJ SOLN
1000.0000 ug | INTRAMUSCULAR | Status: DC
  Administered 2020-04-19 – 2020-05-03 (×3): 1000 ug via INTRAMUSCULAR
  Filled 2020-04-16 (×3): qty 1

## 2020-04-16 MED ORDER — CHLORHEXIDINE GLUCONATE CLOTH 2 % EX PADS
6.0000 | MEDICATED_PAD | Freq: Every day | CUTANEOUS | Status: DC
  Administered 2020-04-16 – 2020-05-03 (×16): 6 via TOPICAL

## 2020-04-16 MED ORDER — HYDROMORPHONE HCL 1 MG/ML IJ SOLN
0.5000 mg | INTRAMUSCULAR | Status: DC | PRN
  Administered 2020-04-17: 1 mg via INTRAVENOUS
  Filled 2020-04-16: qty 1

## 2020-04-16 MED ORDER — ZINC SULFATE 220 (50 ZN) MG PO CAPS
220.0000 mg | ORAL_CAPSULE | Freq: Every day | ORAL | Status: AC
  Administered 2020-04-16 – 2020-04-29 (×14): 220 mg via ORAL
  Filled 2020-04-16 (×14): qty 1

## 2020-04-16 MED ORDER — FENTANYL CITRATE (PF) 250 MCG/5ML IJ SOLN
INTRAMUSCULAR | Status: DC | PRN
  Administered 2020-04-16 (×2): 100 ug via INTRAVENOUS
  Administered 2020-04-16: 50 ug via INTRAVENOUS

## 2020-04-16 MED ORDER — SODIUM CHLORIDE 0.9 % IV BOLUS
500.0000 mL | Freq: Once | INTRAVENOUS | Status: AC
  Administered 2020-04-16: 500 mL via INTRAVENOUS

## 2020-04-16 MED ORDER — ONDANSETRON HCL 4 MG/2ML IJ SOLN: 4.0000 mg | Freq: Once | INTRAMUSCULAR | Status: DC | PRN

## 2020-04-16 MED ORDER — NOREPINEPHRINE 4 MG/250ML-% IV SOLN
2.0000 ug/min | INTRAVENOUS | Status: DC
  Administered 2020-04-16: 2 ug/min via INTRAVENOUS
  Filled 2020-04-16: qty 250

## 2020-04-16 MED ORDER — POTASSIUM CHLORIDE CRYS ER 20 MEQ PO TBCR: 40.0000 meq | EXTENDED_RELEASE_TABLET | Freq: Once | ORAL | Status: DC

## 2020-04-16 MED ORDER — SUCCINYLCHOLINE CHLORIDE 200 MG/10ML IV SOSY
PREFILLED_SYRINGE | INTRAVENOUS | Status: DC | PRN
  Administered 2020-04-16: 100 mg via INTRAVENOUS

## 2020-04-16 MED ORDER — LIDOCAINE 2% (20 MG/ML) 5 ML SYRINGE
INTRAMUSCULAR | Status: DC | PRN
  Administered 2020-04-16: 60 mg via INTRAVENOUS

## 2020-04-16 MED ORDER — SODIUM CHLORIDE 0.9 % IV SOLN
250.0000 mL | INTRAVENOUS | Status: DC
  Administered 2020-04-16 – 2020-04-17 (×2): 250 mL via INTRAVENOUS

## 2020-04-16 MED ORDER — SUGAMMADEX SODIUM 200 MG/2ML IV SOLN
INTRAVENOUS | Status: DC | PRN
  Administered 2020-04-16: 200 mg via INTRAVENOUS

## 2020-04-16 MED ORDER — BISACODYL 5 MG PO TBEC
5.0000 mg | DELAYED_RELEASE_TABLET | Freq: Every day | ORAL | Status: DC | PRN
  Administered 2020-04-22: 5 mg via ORAL
  Filled 2020-04-16: qty 1

## 2020-04-16 MED ORDER — POTASSIUM CHLORIDE CRYS ER 20 MEQ PO TBCR: 20.0000 meq | EXTENDED_RELEASE_TABLET | Freq: Every day | ORAL | Status: DC | PRN

## 2020-04-16 MED ORDER — ONDANSETRON HCL 4 MG/2ML IJ SOLN: 4.0000 mg | Freq: Four times a day (QID) | INTRAMUSCULAR | Status: DC | PRN

## 2020-04-16 MED ORDER — PANTOPRAZOLE SODIUM 40 MG PO TBEC
40.0000 mg | DELAYED_RELEASE_TABLET | Freq: Every day | ORAL | Status: DC
  Administered 2020-04-16 – 2020-05-09 (×24): 40 mg via ORAL
  Filled 2020-04-16 (×24): qty 1

## 2020-04-16 MED ORDER — PROSOURCE PLUS PO LIQD
30.0000 mL | Freq: Two times a day (BID) | ORAL | Status: DC
  Administered 2020-04-17 – 2020-05-03 (×19): 30 mL via ORAL
  Filled 2020-04-16 (×21): qty 30

## 2020-04-16 MED ORDER — CYANOCOBALAMIN 1000 MCG/ML IJ SOLN
1000.0000 ug | Freq: Every day | INTRAMUSCULAR | Status: AC
  Administered 2020-04-16 – 2020-04-18 (×3): 1000 ug via INTRAMUSCULAR
  Filled 2020-04-16 (×3): qty 1

## 2020-04-16 MED ORDER — ROCURONIUM BROMIDE 10 MG/ML (PF) SYRINGE
PREFILLED_SYRINGE | INTRAVENOUS | Status: DC | PRN
  Administered 2020-04-16: 30 mg via INTRAVENOUS

## 2020-04-16 MED ORDER — TRANEXAMIC ACID-NACL 1000-0.7 MG/100ML-% IV SOLN
INTRAVENOUS | Status: DC | PRN
  Administered 2020-04-16: 1000 mg via INTRAVENOUS

## 2020-04-16 MED ORDER — SODIUM CHLORIDE 0.9 % IV SOLN: INTRAVENOUS | Status: AC

## 2020-04-16 MED ORDER — SODIUM CHLORIDE 0.9% FLUSH
10.0000 mL | Freq: Two times a day (BID) | INTRAVENOUS | Status: DC
  Administered 2020-04-17 – 2020-05-01 (×23): 10 mL

## 2020-04-16 MED ORDER — MAGNESIUM CITRATE PO SOLN
1.0000 | Freq: Once | ORAL | Status: DC | PRN
  Filled 2020-04-16: qty 296

## 2020-04-16 MED ORDER — LABETALOL HCL 5 MG/ML IV SOLN: 10.0000 mg | INTRAVENOUS | Status: DC | PRN

## 2020-04-16 MED ORDER — ALUM & MAG HYDROXIDE-SIMETH 200-200-20 MG/5ML PO SUSP
15.0000 mL | ORAL | Status: DC | PRN
  Filled 2020-04-16: qty 30

## 2020-04-16 MED ORDER — TRANEXAMIC ACID-NACL 1000-0.7 MG/100ML-% IV SOLN
INTRAVENOUS | Status: AC
  Filled 2020-04-16: qty 100

## 2020-04-16 MED ORDER — ASCORBIC ACID 500 MG PO TABS
1000.0000 mg | ORAL_TABLET | Freq: Every day | ORAL | Status: DC
  Administered 2020-04-16 – 2020-05-09 (×24): 1000 mg via ORAL
  Filled 2020-04-16 (×24): qty 2

## 2020-04-16 MED ORDER — GUAIFENESIN-DM 100-10 MG/5ML PO SYRP
15.0000 mL | ORAL_SOLUTION | ORAL | Status: DC | PRN
  Filled 2020-04-16: qty 15

## 2020-04-16 MED ORDER — MIDAZOLAM HCL 2 MG/2ML IJ SOLN
INTRAMUSCULAR | Status: DC | PRN
  Administered 2020-04-16: 2 mg via INTRAVENOUS

## 2020-04-16 MED ORDER — SODIUM CHLORIDE 0.9% FLUSH
10.0000 mL | INTRAVENOUS | Status: DC | PRN
Start: 2020-04-16 — End: 2020-05-05

## 2020-04-16 MED ORDER — OXYCODONE HCL 5 MG PO TABS
10.0000 mg | ORAL_TABLET | ORAL | Status: DC | PRN
  Administered 2020-04-17 – 2020-04-19 (×6): 15 mg via ORAL
  Filled 2020-04-16 (×6): qty 3

## 2020-04-16 MED ORDER — ONDANSETRON HCL 4 MG/2ML IJ SOLN
INTRAMUSCULAR | Status: DC | PRN
  Administered 2020-04-16: 4 mg via INTRAVENOUS

## 2020-04-16 MED ORDER — HYDRALAZINE HCL 20 MG/ML IJ SOLN: 5.0000 mg | INTRAMUSCULAR | Status: DC | PRN

## 2020-04-16 MED ORDER — HYDROMORPHONE HCL 1 MG/ML IJ SOLN
0.2500 mg | INTRAMUSCULAR | Status: DC | PRN
  Administered 2020-04-16 (×2): 0.5 mg via INTRAVENOUS

## 2020-04-16 MED ORDER — SODIUM CHLORIDE 0.9 % IV SOLN: INTRAVENOUS | Status: DC | PRN

## 2020-04-16 MED ORDER — FENTANYL CITRATE (PF) 250 MCG/5ML IJ SOLN
INTRAMUSCULAR | Status: AC
  Filled 2020-04-16: qty 5

## 2020-04-16 MED ORDER — PHENOL 1.4 % MT LIQD
1.0000 | OROMUCOSAL | Status: DC | PRN
  Filled 2020-04-16: qty 177

## 2020-04-16 MED ORDER — PROPOFOL 10 MG/ML IV BOLUS
INTRAVENOUS | Status: DC | PRN
  Administered 2020-04-16: 100 mg via INTRAVENOUS

## 2020-04-16 MED ORDER — METOPROLOL TARTRATE 5 MG/5ML IV SOLN: 2.0000 mg | INTRAVENOUS | Status: DC | PRN

## 2020-04-16 MED ORDER — 0.9 % SODIUM CHLORIDE (POUR BTL) OPTIME
TOPICAL | Status: DC | PRN
  Administered 2020-04-16: 1000 mL

## 2020-04-16 MED ORDER — DEXAMETHASONE SODIUM PHOSPHATE 10 MG/ML IJ SOLN
INTRAMUSCULAR | Status: DC | PRN
  Administered 2020-04-16: 10 mg via INTRAVENOUS

## 2020-04-16 MED ORDER — MAGNESIUM SULFATE 2 GM/50ML IV SOLN
2.0000 g | Freq: Once | INTRAVENOUS | Status: AC
  Administered 2020-04-16: 2 g via INTRAVENOUS
  Filled 2020-04-16: qty 50

## 2020-04-16 MED ORDER — DAKINS (1/4 STRENGTH) 0.125 % EX SOLN
Freq: Two times a day (BID) | CUTANEOUS | Status: AC
  Administered 2020-04-16: 1
  Filled 2020-04-16 (×2): qty 473

## 2020-04-16 MED ORDER — HYDROMORPHONE HCL 1 MG/ML IJ SOLN
INTRAMUSCULAR | Status: AC
  Filled 2020-04-16: qty 1

## 2020-04-16 MED ORDER — POTASSIUM PHOSPHATES 15 MMOLE/5ML IV SOLN
30.0000 mmol | Freq: Once | INTRAVENOUS | Status: AC
  Administered 2020-04-16: 30 mmol via INTRAVENOUS
  Filled 2020-04-16: qty 10

## 2020-04-16 SURGICAL SUPPLY — 37 items
BLADE SAW RECIP 87.9 MT (BLADE) ×2 IMPLANT
BLADE SURG 21 STRL SS (BLADE) ×2 IMPLANT
BNDG COHESIVE 6X5 TAN STRL LF (GAUZE/BANDAGES/DRESSINGS) IMPLANT
CANISTER WOUND CARE 500ML ATS (WOUND CARE) ×2 IMPLANT
COVER SURGICAL LIGHT HANDLE (MISCELLANEOUS) ×2 IMPLANT
COVER WAND RF STERILE (DRAPES) IMPLANT
CUFF TOURN SGL QUICK 34 (TOURNIQUET CUFF) ×1
CUFF TRNQT CYL 34X4.125X (TOURNIQUET CUFF) ×1 IMPLANT
DRAPE INCISE IOBAN 66X45 STRL (DRAPES) ×2 IMPLANT
DRAPE U-SHAPE 47X51 STRL (DRAPES) ×2 IMPLANT
DRESSING PREVENA PLUS CUSTOM (GAUZE/BANDAGES/DRESSINGS) ×1 IMPLANT
DRSG PREVENA PLUS CUSTOM (GAUZE/BANDAGES/DRESSINGS) ×2
DURAPREP 26ML APPLICATOR (WOUND CARE) ×2 IMPLANT
ELECT REM PT RETURN 9FT ADLT (ELECTROSURGICAL) ×2
ELECTRODE REM PT RTRN 9FT ADLT (ELECTROSURGICAL) ×1 IMPLANT
GLOVE BIOGEL PI IND STRL 9 (GLOVE) ×1 IMPLANT
GLOVE BIOGEL PI INDICATOR 9 (GLOVE) ×1
GLOVE SURG ORTHO 9.0 STRL STRW (GLOVE) ×2 IMPLANT
GOWN STRL REUS W/ TWL XL LVL3 (GOWN DISPOSABLE) ×2 IMPLANT
GOWN STRL REUS W/TWL XL LVL3 (GOWN DISPOSABLE) ×2
KIT BASIN OR (CUSTOM PROCEDURE TRAY) ×2 IMPLANT
KIT TURNOVER KIT B (KITS) ×2 IMPLANT
MANIFOLD NEPTUNE II (INSTRUMENTS) ×2 IMPLANT
NS IRRIG 1000ML POUR BTL (IV SOLUTION) ×2 IMPLANT
PACK ORTHO EXTREMITY (CUSTOM PROCEDURE TRAY) ×2 IMPLANT
PAD ARMBOARD 7.5X6 YLW CONV (MISCELLANEOUS) ×2 IMPLANT
PREVENA RESTOR ARTHOFORM 46X30 (CANNISTER) ×2 IMPLANT
SPONGE LAP 18X18 RF (DISPOSABLE) IMPLANT
STAPLER VISISTAT 35W (STAPLE) IMPLANT
STOCKINETTE IMPERVIOUS LG (DRAPES) ×2 IMPLANT
SUT ETHILON 2 0 PSLX (SUTURE) IMPLANT
SUT SILK 2 0 (SUTURE) ×1
SUT SILK 2-0 18XBRD TIE 12 (SUTURE) ×1 IMPLANT
SUT VIC AB 1 CTX 27 (SUTURE) ×4 IMPLANT
TOWEL GREEN STERILE (TOWEL DISPOSABLE) ×2 IMPLANT
TUBE CONNECTING 12X1/4 (SUCTIONS) ×2 IMPLANT
YANKAUER SUCT BULB TIP NO VENT (SUCTIONS) ×2 IMPLANT

## 2020-04-16 NOTE — Consult Note (Signed)
[facility] Nurse Consult Note: Patient is homeless and sleeps on the street.  He states right lateral upper thigh/hip wound is from sleeping on concrete surfaces.   Left foot/history fracture reported by patient  X ray reveals destructive changes.  Patient unable to tolerate MRI.  Reason for Consult:Right hip/lateral thigh wound Wound type:trauma Pressure Injury POA:NA Measurement: 3 cm x 2.5 cm wound  100% adherent slough to wound bed.  2 cm circumferential erythema present to wound .periwound is boggy and warm to touch.   Wound bed:100% devitalized tissue. Stringy adherent yellow slough.  Drainage (amount, consistency, odor) moderate serosanguinous no odor.  Periwound:erytehma and bogginess noted. Tender to touch Dressing procedure/placement/frequency:Cleanse right hip with NS and pat dry. Apply Dakins moist 2x2 and top with dry gauze  Secure with foam dressing.  Change Dakins and gauze twice daily and foam dressing every other day and PRN soilage.  Will not follow at this time.  Please re-consult if needed.  Domenic Moras MSN, RN, FNP-BC CWON Wound, Ostomy, Continence Nurse Pager [phone]

## 2020-04-16 NOTE — Progress Notes (Signed)
Orthopedic Tech Progress Note Patient Details:  Andrew Chaney 1955-07-27 [id number] Vive Protocol BK has been ordered  Patient ID: Andrew Chaney, male   DOB: 12-Mar-1955, 65 y.o.   MRN: [id number]   [name] 04/16/2020, 9:35 PM

## 2020-04-16 NOTE — Consult Note (Signed)
ORTHOPAEDIC CONSULTATION  REQUESTING PHYSICIAN: Elgergawy, Silver Huguenin, MD  Chief Complaint: Purulent abscess drainage left foot  HPI: Andrew Chaney is a 65 y.o. male who presents with chronic osteomyelitis of the left calcaneus with purulent abscess and decubitus right hip ulcer.  Patient states that he is homeless and is a smoker.  Past Medical History:  Diagnosis Date  . Bipolar 1 disorder ([place])   . 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 Renal disorder    Past Surgical History:  Procedure Laterality Date  . TEE WITHOUT CARDIOVERSION N/A 08/03/2019   Procedure: TRANSESOPHAGEAL ECHOCARDIOGRAM (TEE);  Surgeon: Pixie Casino, MD;  Location: [facility] ENDOSCOPY;  Service: Cardiovascular;  Laterality: N/A;   Social History   Socioeconomic History  . Marital status: Single    Spouse name: Not on file  . Number of children: Not on file  . Years of education: Not on file  . Highest education level: Not on file  Occupational History  . Not on file  Tobacco Use  . Smoking status: Current Every Day Smoker    Packs/day: 0.50    Types: Cigarettes  . Smokeless tobacco: Never Used  Vaping Use  . Vaping Use: Never used  Substance and Sexual Activity  . Alcohol use: Yes  . Drug use: Yes    Types: Marijuana    Comment: States no longer uses marijuana because he is "on probation"  . Sexual activity: Not Currently  Other Topics Concern  . Not on file  Social History Narrative  . Not on file   Social Determinants of Health   Financial Resource Strain: Not on file  Food Insecurity: Not on file  Transportation Needs: Not on file  Physical Activity: Not on file  Stress: Not on file  Social Connections: Not on file   Family History  Problem Relation Age of Onset  . Dementia Mother    - negative except otherwise stated in the family history section No Known Allergies Prior to Admission medications   Medication Sig Start Date End Date Taking? Authorizing Provider   furosemide (LASIX) 20 MG tablet Take 1 tablet (20 mg total) by mouth daily. 03/31/20   Drenda Freeze, MD  hydrOXYzine (ATARAX/VISTARIL) 25 MG tablet Take 1 tablet (25 mg total) by mouth at bedtime as needed (insomnia). Patient not taking: No sig reported 03/23/20   Patrecia Pour, NP  hydrOXYzine (ATARAX/VISTARIL) 25 MG tablet TAKE 1 TABLET (25 MG TOTAL) BY MOUTH AT BEDTIME AS NEEDED (INSOMNIA). 03/23/20 03/23/21  Patrecia Pour, NP  levETIRAcetam (KEPPRA) 500 MG tablet Take 1 tablet (500 mg total) by mouth 2 (two) times daily. Patient not taking: Reported on 03/23/2020 08/07/19 09/06/19  Andrew Au, MD  phenytoin (DILANTIN) 200 MG ER capsule Take 1 capsule (200 mg total) by mouth 2 (two) times daily. Patient not taking: Reported on 03/23/2020 08/07/19 09/06/19  Andrew Au, MD   DG Chest Portable 1 View  Result Date: 04/15/2020 CLINICAL DATA:  Fever EXAM: PORTABLE CHEST 1 VIEW COMPARISON:  04/09/2020 FINDINGS: Heart is normal size. Linear atelectasis at the right base. No confluent opacities otherwise. No effusions. No acute bony abnormality. Sclerosis in the proximal left humerus compatible with bone infarct or enchondroma. IMPRESSION: Right base atelectasis. No active disease. Electronically Signed   By: Rolm Baptise M.D.   On: 04/15/2020 22:48   DG Foot Complete Left  Result Date: 04/15/2020 CLINICAL DATA:  Left heel pain. EXAM: LEFT FOOT -  COMPLETE 3+ VIEW COMPARISON:  March 30, 2020. FINDINGS: Increased lucency is seen involving the bone fragments of the comminuted fracture involving the posterior calcaneus noted on prior exam. This is concerning for lytic destruction, most likely due to osteomyelitis. Increased soft tissue swelling is noted superior to the posterior calcaneus suggesting inflammation. IMPRESSION: Increased lucency is seen involving the fragments of the comminuted fracture involving the posterior calcaneus noted on prior exam. This is concerning for osteomyelitis and MRI is  recommended for further evaluation. Electronically Signed   By: Marijo Conception M.D.   On: 04/15/2020 11:00   - pertinent xrays, CT, MRI studies were reviewed and independently interpreted  Positive ROS: All other systems have been reviewed and were otherwise negative with the exception of those mentioned in the HPI and as above.  Physical Exam: General: Alert, no acute distress Psychiatric: Patient is competent for consent with normal mood and affect Lymphatic: No axillary or cervical lymphadenopathy Cardiovascular: No pedal edema Respiratory: No cyanosis, no use of accessory musculature GI: No organomegaly, abdomen is soft and non-tender    Images:  @ENCIMAGES @  Labs:  Lab Results  Component Value Date   ESRSEDRATE 38 (H) 04/15/2020   CRP 19.5 (H) 04/15/2020   REPTSTATUS PENDING 04/15/2020   CULT GRAM POSITIVE COCCI 04/15/2020   LABORGA STAPHYLOCOCCUS AUREUS 07/30/2019    Lab Results  Component Value Date   ALBUMIN 1.8 (L) 04/16/2020   ALBUMIN 1.8 (L) 04/16/2020   ALBUMIN 3.8 04/09/2020    No results found for: CBC  Neurologic: Patient does not have protective sensation bilateral lower extremities.   MUSCULOSKELETAL:   Skin: Examination patient has a extremely swollen left foot there is purulent drainage from the calcaneus.  Radiographs shows advanced destructive bony change of the calcaneus consistent with chronic osteomyelitis.  Patient has a strong dorsalis pedis pulse.  Examination of the right hip he has a partial-thickness ischemic ulcer to the right hip that is 2 cm in diameter with an area of cellulitis approximately 6 cm in diameter.  Albumin 1.8.  White blood cell 17.5 hemoglobin 10.3  Assessment: Assessment: Severe protein caloric malnutrition with abscess and osteomyelitis left heel with a decubitus ulcer right hip.  Plan: Plan: We will plan for urgent below the knee amputation on the left this afternoon, will hold on any intervention for the  right hip due to his poor nutrition status and low likelihood of healing a surgical incision on the hip.  Patient will need aggressive nutrition supplement and will be difficult for placement.  Thank you for the consult and the opportunity to see Andrew Chaney, [place] [phone] 10:50 AM

## 2020-04-16 NOTE — ED Notes (Signed)
Attempted to call report, pt scheduled for OR at 1530, charge wants pt go from ED or OR. Nurse that is receiving pt after OR will call this nurse back for report when she is able

## 2020-04-16 NOTE — ED Notes (Signed)
Called pt's brother Nicole Kindred [phone]) per pt request to let him know of pending surgery and BKA.

## 2020-04-16 NOTE — Progress Notes (Signed)
Report received from [place] in [place] by [employer] nurse on 82M.

## 2020-04-16 NOTE — Anesthesia Procedure Notes (Signed)
Procedure Name: Intubation Date/Time: 04/16/2020 4:47 PM Performed by: Rande Brunt, CRNA Pre-anesthesia Checklist: Patient identified, Emergency Drugs available, Suction available and Patient being monitored Patient Re-evaluated:Patient Re-evaluated prior to induction Oxygen Delivery Method: Circle System Utilized Preoxygenation: Pre-oxygenation with 100% oxygen Induction Type: IV induction, Cricoid Pressure applied and Rapid sequence Ventilation: Mask ventilation without difficulty Laryngoscope Size: Mac and 4 Grade View: Grade II Tube type: Oral Tube size: 7.5 mm Number of attempts: 1 Airway Equipment and Method: Stylet and Oral airway Placement Confirmation: ETT inserted through vocal cords under direct vision,  positive ETCO2 and breath sounds checked- equal and bilateral Secured at: 22 cm Tube secured with: Tape Dental Injury: Teeth and Oropharynx as per pre-operative assessment

## 2020-04-16 NOTE — ED Notes (Signed)
RN paged Dr. Sherry Ruffing per pt critical lactic acid of 2.0

## 2020-04-16 NOTE — Transfer of Care (Signed)
Immediate Anesthesia Transfer of Care Note  Patient: Andrew Chaney  Procedure(s) Performed: AMPUTATION BELOW KNEE (Left Knee)  Patient Location: PACU  Anesthesia Type:General  Level of Consciousness: awake, alert  and oriented  Airway & Oxygen Therapy: Patient Spontanous Breathing and Patient connected to face mask oxygen  Post-op Assessment: Report given to RN, Post -op Vital signs reviewed and stable and Patient moving all extremities  Post vital signs: Reviewed and stable  Last Vitals:  Vitals Value Taken Time  BP 104/71 04/16/20 1759  Temp 36.4 C 04/16/20 1759  Pulse 74 04/16/20 1801  Resp 20 04/16/20 1801  SpO2 99 % 04/16/20 1801  Vitals shown include unvalidated device data.  Last Pain:  Vitals:   04/16/20 0842  TempSrc:   PainSc: 4          Complications: No complications documented.

## 2020-04-16 NOTE — Anesthesia Preprocedure Evaluation (Addendum)
Anesthesia Evaluation  Patient identified by MRN, date of birth, ID band Patient awake    Reviewed: Allergy & Precautions, NPO status , Patient's Chart, lab work & pertinent test results, reviewed documented beta blocker date and time   Airway Mallampati: I  TM Distance: >3 FB Neck ROM: Full    Dental  (+) Poor Dentition, Missing, Chipped, Dental Advisory Given   Pulmonary pneumonia, resolved, Current Smoker and Patient abstained from smoking.,    Pulmonary exam normal breath sounds clear to auscultation       Cardiovascular negative cardio ROS Normal cardiovascular exam Rhythm:Regular Rate:Normal     Neuro/Psych Seizures -, Poorly Controlled,  PSYCHIATRIC DISORDERS Bipolar Disorder Schizophrenia Hx/o psychosis Homeless Neuromuscular disease    GI/Hepatic negative GI ROS, (+)     substance abuse  marijuana use, Hepatitis -, C  Endo/Other  negative endocrine ROS  Renal/GU ARFRenal disease  negative genitourinary   Musculoskeletal Left foot osteomyelitis   Abdominal   Peds  Hematology  (+) anemia , Bacteremia of unknown etiology Septic shock -on pressors Coagulopathy PT 17.0, INR 1.4   Anesthesia Other Findings   Reproductive/Obstetrics Hx/o germ cell seminoma 2011                             Anesthesia Physical  Anesthesia Plan  ASA: III and emergent  Anesthesia Plan: General   Post-op Pain Management:    Induction: Intravenous  PONV Risk Score and Plan: 0 and Treatment may vary due to age or medical condition and Ondansetron  Airway Management Planned: Oral ETT  Additional Equipment:   Intra-op Plan:   Post-operative Plan: Possible Post-op intubation/ventilation  Informed Consent: I have reviewed the patients History and Physical, chart, labs and discussed the procedure including the risks, benefits and alternatives for the proposed anesthesia with the patient or  authorized representative who has indicated his/her understanding and acceptance.     Dental advisory given  Plan Discussed with: CRNA and Anesthesiologist  Anesthesia Plan Comments: (May need Aline if BP unstable intraop.)       Anesthesia Quick Evaluation

## 2020-04-16 NOTE — Op Note (Signed)
   Date of Surgery: 04/16/2020  INDICATIONS: Andrew Chaney is a 65 y.o.-year-old male who presents with chronic osteomyelitis left calcaneus with a large purulent draining abscess.  Patient is septic.Marland Kitchen  PREOPERATIVE DIAGNOSIS: Abscess osteomyelitis left hindfoot  POSTOPERATIVE DIAGNOSIS: Same.  PROCEDURE: Transtibial amputation Application of Prevena wound VAC  SURGEON: Sharol Given, M.D.  ANESTHESIA:  general  IV FLUIDS AND URINE: See anesthesia records.  ESTIMATED BLOOD LOSS: See anesthesia records.  COMPLICATIONS: None.  DESCRIPTION OF PROCEDURE: The patient was brought to the operating room after undergoing regional anesthetic. After adequate levels of anesthesia were obtained patient's lower extremity was prepped using DuraPrep draped into a sterile field. A timeout was called. The foot was draped out of the sterile field with impervious stockinette. A transverse incision was made 11 cm distal to the tibial tubercle. This curved proximally and a large posterior flap was created. The tibia was transected 1 cm proximal to the skin incision. The fibula was transected just proximal to the tibial incision. The tibia was beveled anteriorly. A large posterior flap was created. The sciatic nerve was pulled cut and allowed to retract. The vascular bundles were suture ligated with 2-0 silk. The deep and superficial fascial layers were closed using #1 Vicryl. The skin was closed using staples and 2-0 nylon. The wound was covered with a Prevena customizable and arthroform wound VAC.  The dressing was sealed with dermatac there was a good suction fit. A prosthetic shrinker will be applied in patient's room. Patient was taken to the PACU in stable condition.   DISCHARGE PLANNING:  Antibiotic duration: 24 hours  Weightbearing: Nonweightbearing on the operative extremity  Pain medication: Opioid pathway  Dressing care/ Wound VAC: Continue wound VAC for 1 week after discharge  Discharge to: Discharge  planning based on therapy's recommendations for possible inpatient rehabilitation, outpatient rehabilitation, or discharge to home with therapy  Follow-up: In the office 1 week post operative.  Meridee Score, MD [place] 5:50 PM

## 2020-04-16 NOTE — ED Notes (Signed)
RN paged Dr. Hal Hope per pt critical lactic acid of 2.0 and oral temp of 102.5

## 2020-04-16 NOTE — Anesthesia Procedure Notes (Signed)
Central Venous Catheter Insertion Performed by: Roberts Gaudy, MD, anesthesiologist Start/End4/06/2020 5:30 PM, 04/16/2020 5:40 PM Patient location: Pre-op. Preanesthetic checklist: patient identified, IV checked, site marked, risks and benefits discussed, surgical consent, monitors and equipment checked, pre-op evaluation, timeout performed and anesthesia consent Position: Trendelenburg Lidocaine 1% used for infiltration and patient sedated Hand hygiene performed  and maximum sterile barriers used  Catheter size: 8 Fr Total catheter length 16. Central line was placed.Double lumen Procedure performed without using ultrasound guided technique. Attempts: 1 Following insertion, dressing applied and line sutured. Post procedure assessment: blood return through all ports  Patient tolerated the procedure well with no immediate complications.

## 2020-04-16 NOTE — Anesthesia Procedure Notes (Signed)
Arterial Line Insertion Start/End4/06/2020 5:05 PM, 04/16/2020 5:15 PM Performed by: Rande Brunt, CRNA  Patient location: OR. Preanesthetic checklist: patient identified, IV checked, site marked, risks and benefits discussed, surgical consent, monitors and equipment checked, pre-op evaluation, timeout performed and anesthesia consent Lidocaine 1% used for infiltration Right, radial was placed Catheter size: 20 G Hand hygiene performed  and maximum sterile barriers used   Attempts: 1 Procedure performed without using ultrasound guided technique. Following insertion, dressing applied and Biopatch. Post procedure assessment: normal and unchanged  Patient tolerated the procedure well with no immediate complications.

## 2020-04-16 NOTE — Consult Note (Signed)
NAME:  Andrew Chaney, MRN:  [id number], DOB:  11/29/1955, LOS: 1 ADMISSION DATE:  04/15/2020, CONSULTATION DATE:  04/15/2020 REFERRING MD:  Alexandria Lodge MD, CHIEF COMPLAINT:  Osteomyelitis   History of Present Illness:  Andrew Chaney is a 65 y.o. male, with a past medical history of bipolar disorder, homelessness, calcaneal fracture, and MSSA bacteremia in 2021, who presented to the [facility] ED with c/o body aches.   He was diagnosed with a left calcaneal fracture several months ago and has had trouble ambulating since. In the ED he was found to have osteomyelitis and orthopedic surgery was consulted. Broad spectrum antibiotics with fluid resuscitation was initiated on 04/05. PCCM was consulted for continued hypotension. He is curretly in the [facility] ED in critical condition.  Pertinent  Medical History  Bipolar disorder, MSSA Bacteremia, Seizure disorder, Cannabis abuse, chronic hep C, alcohol abuse, Smoking  Significant Hospital Events: Including procedures, antibiotic start and stop dates in addition to other pertinent events   . 4/5 Vanc,Flagyl (one dose) &  Cefepime. BC sent and already growing [place]. Seen by surg team w/ plan for left BKA and on-going obs/maximizing nutrition and wound care re: the right hip.  Marland Kitchen 4/6 PCCM consulted for fevers, and hypotension/septic shock.   Interim History / Subjective:  Presented to [facility] ED, 2L NS given on 4/5, 1.5 NS given 4/6  24 hour: Tmax 103, HR 92-116, BP 68/48 to 111/69  Subjective: Complaints of pain in LLE, no chest pain or SOB, wondering about surgery.   Objective   Blood pressure (!) 76/42, pulse 77, temperature (!) 101.4 F (38.6 C), temperature source Oral, resp. rate 20, height 6' (1.829 m), weight 71 kg, SpO2 97 %. on [facility]        Intake/Output Summary (Last 24 hours) at 04/16/2020 1129 Last data filed at 04/16/2020 1041 Gross per 24 hour  Intake 2842.76 ml  Output --  Net 2842.76 ml   Filed Weights   04/15/20 1800  Weight: 71 kg     Examination: General: Chronically ill appearing, lying in bed, interactive HEENT: MM pink/moist, trachea midline, atraumatic Neuro: GCS 15, RASS 0, PERRL 30mm CV: s1s2, NSR, no m/r/g PULM:  Lungs clear bilaterally, air movement through all lobes, symmetrical chest expansion GI: soft, bsx4 active, nontender  Extremities: L leg edematous, warm, cap refill < 3 seconds Skin: Right hip decub-erythematous, Left foot wound draining purulent   Labs/imaging that I havepersonally reviewed  (right click and "Reselect all SmartList Selections" daily)  CBC, BMP, Procal, Lactate, glucose, cultures, CXR  Resolved Hospital Problem list     Assessment & Plan:   Septic Shock due to Osteomyelitis of  Left heel +/- right decub ulcer (both Present at Admission) Presumed from LEFT foot wound, RIGHT hip wound present, received 2L NS on 4/5 and 1.5L NS today. WBC 17.5, procal .94 -500LR bolus -Initiate Peripheral Levophed, titrate to goal map of 65 -Continue Vanc/Cefepime -Follow up Culture results, will adjust as cultures finalize -OR in afternoon with ortho for amputation/source control -Follow up MRI of foot -Continue to monitor RIGHT hip wound -Obtain AM CBC, continue to trend fever and WBC -Admit to ICU for BP mgmt, cont tele -Continue to trend procal and lactate  Right Decubitus Ulcer, Present at Admission -Appreciate wound care assistance -Will follow reccomendations  Acute on Chronic Anemia Likely chronic due to malnutrition, no signs of active bleeding -Continue B12, B1 -AM CBC -Transfuse at Hgb7  Fluid and electrolyte imbalance: Hypokalemia & Hyponatremia -Replete KCL -Will  continue to monitor  Severe Protein Caloric Manutition -Continue NPO prior to OR -Will obtain SW and RD assistance for nutrition -Will monitor for refeeding syndrome  H/o Seizure Disorder, Alcohol Abuse & [place] Smoking Reports of not taking home medications -On CIWA precuations -Monitor for  seizures -Alcohol and Smoking cesation education when appropriate  Hepatitis C -Outpatient management   Best practice (right click and "Reselect all SmartList Selections" daily)  Diet:  NPO Pain/Anxiety/Delirium protocol (if indicated): No VAP protocol (if indicated): Not indicated DVT prophylaxis: SCD on rt leg GI prophylaxis: N/A Glucose control:  SSI No Central venous access:  N/A Arterial line:  N/A Foley:  N/A Mobility:  bed rest  PT consulted: N/A Last date of multidisciplinary goals of care discussion [Discussed with patient at bedside] Code Status:  full code Disposition: ICU  Labs   CBC: Recent Labs  Lab 04/09/20 1233 04/15/20 1151 04/16/20 0251  WBC 7.7 15.2* 17.5*  NEUTROABS 5.5 12.7* 14.4*  HGB 13.5 11.3* 10.3*  HCT 42.0 33.8* 31.1*  MCV 99.5 97.1 97.5  PLT 367 261 818    Basic Metabolic Panel: Recent Labs  Lab 04/09/20 1233 04/15/20 1151 04/16/20 0250 04/16/20 0251  NA 138 134*  --  132*  K 4.5 3.5  --  3.2*  CL 105 102  --  104  CO2 24 26  --  22  GLUCOSE 104* 110*  --  105*  BUN 21 13  --  16  CREATININE 1.04 0.84  --  0.87  CALCIUM 9.9 8.8*  --  7.8*  MG  --   --  1.4*  --   PHOS  --   --  1.9*  --    GFR: Estimated Creatinine Clearance: 88.4 mL/min (by C-G formula based on SCr of 0.87 mg/dL). Recent Labs  Lab 04/09/20 1233 04/15/20 1151 04/15/20 1734 04/16/20 0250 04/16/20 0251  PROCALCITON  --   --   --  0.94  --   WBC 7.7 15.2*  --   --  17.5*  LATICACIDVEN  --   --  1.9  --  2.0*    Liver Function Tests: Recent Labs  Lab 04/09/20 1233 04/16/20 0250 04/16/20 0251  AST 27 14* 14*  ALT 19 10 10   ALKPHOS 79 53 51  BILITOT 1.1 0.8 0.8  PROT 7.0 4.7* 4.5*  ALBUMIN 3.8 1.8* 1.8*   No results for input(s): LIPASE, AMYLASE in the last 168 hours. Recent Labs  Lab 04/09/20 1233  AMMONIA 17    ABG No results found for: PHART, PCO2ART, PO2ART, HCO3, TCO2, ACIDBASEDEF, O2SAT   Coagulation Profile: Recent Labs  Lab  04/16/20 0250  INR 1.4*    Cardiac Enzymes: Recent Labs  Lab 04/16/20 0250  CKTOTAL 84    HbA1C: No results found for: HGBA1C  CBG: No results for input(s): GLUCAP in the last 168 hours.  Review of Systems:   Review of Systems  Constitutional: Positive for malaise/fatigue.  HENT: Negative.   Eyes: Negative.   Respiratory: Negative.   Cardiovascular: Positive for leg swelling.  Gastrointestinal: Negative.   Genitourinary: Negative.   Musculoskeletal:       Left foot and right hip pain  Skin: Positive for rash.       Discoloration of the left foot  Neurological: Negative.   Endo/Heme/Allergies: Negative.   Psychiatric/Behavioral: Negative.      Past Medical History:  He,  has a past medical history of Bipolar 1 disorder ([place]), Cancer ([place]) (2011), and  Renal disorder.   Surgical History:   Past Surgical History:  Procedure Laterality Date  . TEE WITHOUT CARDIOVERSION N/A 08/03/2019   Procedure: TRANSESOPHAGEAL ECHOCARDIOGRAM (TEE);  Surgeon: Pixie Casino, MD;  Location: [facility] ENDOSCOPY;  Service: Cardiovascular;  Laterality: N/A;     Social History:   reports that he has been smoking cigarettes. He has been smoking about 0.50 packs per day. He has never used smokeless tobacco. He reports current alcohol use. He reports current drug use. Drug: Marijuana.   Family History:  His family history includes Dementia in his mother.   Allergies No Known Allergies   Home Medications  Prior to Admission medications   Medication Sig Start Date End Date Taking? Authorizing Provider  furosemide (LASIX) 20 MG tablet Take 1 tablet (20 mg total) by mouth daily. 03/31/20   Drenda Freeze, MD  hydrOXYzine (ATARAX/VISTARIL) 25 MG tablet Take 1 tablet (25 mg total) by mouth at bedtime as needed (insomnia). Patient not taking: No sig reported 03/23/20   Patrecia Pour, NP  hydrOXYzine (ATARAX/VISTARIL) 25 MG tablet TAKE 1 TABLET (25 MG TOTAL) BY MOUTH AT BEDTIME AS NEEDED  (INSOMNIA). 03/23/20 03/23/21  Patrecia Pour, NP  levETIRAcetam (KEPPRA) 500 MG tablet Take 1 tablet (500 mg total) by mouth 2 (two) times daily. Patient not taking: Reported on 03/23/2020 08/07/19 09/06/19  Andrew Au, MD  phenytoin (DILANTIN) 200 MG ER capsule Take 1 capsule (200 mg total) by mouth 2 (two) times daily. Patient not taking: Reported on 03/23/2020 08/07/19 09/06/19  Andrew Au, MD     Critical care time: 35 minutes    [employer]mond [employer]., MSN, APRN, AGACNP-BC [facility]  04/16/2020 , 12:32 PM   Please see [email or web address] for pager details  From 7a-7p if no response, please call [phone] After hours, please call Elink at [phone]

## 2020-04-16 NOTE — ED Notes (Signed)
Help get patient changed into a gown placed patient on the cardiac monitor patient is resting with call bell in reach

## 2020-04-16 NOTE — Progress Notes (Signed)
  Right hip   Left ankle

## 2020-04-16 NOTE — Consult Note (Signed)
[facility]    Date of Admission:  04/15/2020     Total days of antibiotics 1               Reason for Consult: Staph Aureus Bacteremia  Referring Provider: Tivis Ringer / Lead Primary Care Provider: Patient, No Pcp Per (Inactive)   ASSESSMENT:  Andrew Chaney is a 65 y/o male with sepsis secondary to abscess and osteomyelitis of the left ankle and decubitus ulcer of the right hip complicated by MRSA bacteremia. Currently hypotensive and requiring vasopressor support. Scheduled for urgent below knee amputation this afternoon. No surgical intervention planned for the right hip secondary to severe protein calorie malnutrition. TTE has been ordered and is pending and will require TEE. Repeat blood cultures in the morning. Continue vancomycin, cefepime and metronidazole for now. Therapeutic monitoring of renal function and vancomycin level per protocol. Will likely require prolonged course of antibiotics given MRSA infection with disposition being challenging as he is currently homeless. Continue sepsis management per CCM and wound care per Orthopedics.  PLAN:  1. Continue current dose of vancomycin, cefepime and metronidazole.  2. Contact precautions for MRSA.  3. Awaiting BKA this afternoon.  4. TTE pending and likely to need TEE 5. Repeat blood cultures in the morning.  6. Sepsis management per CCM.    Active Problems:   Hyponatremia   Chronic hepatitis C ([place])   Bipolar disorder, most recent episode manic ([place])   Osteomyelitis ([place])   Septic shock ([place])   . cyanocobalamin  1,000 mcg Intramuscular Daily   Followed by  . [START ON 04/19/2020] cyanocobalamin  1,000 mcg Intramuscular Weekly  . docusate sodium  100 mg Oral BID  . folic acid  1 mg Oral Daily  . multivitamin with minerals  1 tablet Oral Daily  . potassium chloride  40 mEq Oral Once  . senna  1 tablet Oral BID  . sodium hypochlorite   Irrigation BID  . thiamine injection  100 mg  Intravenous Daily     HPI: Andrew Chaney is a 65 y.o. male with previous medical history of bipolar 1 disorder, germ cell seminoma in 2011, seizure disorder, cannabis use and chronic Hepatitis C presenting to the ED with generalized body aches, fatigue and trouble walking secondary to calcaneal fracture of the left foot several months ago.   X-rays of the left foot with increased lucency involving the fragments of the comminuted fracture involving the posterior calcaneus concerning for osteomyelitis. Chest x-ray with no active disease and right base atelectasis. Lab work with leukocytosis with WBC count of 17.5 and lactic acidosis of 2.0. Febrile with max temperature of 103. Blood cultures positive for MRSA identified on BCID. He has been hypotensive and CCM consulted.  Ortho evaluation by Dr. Sharol Given with abscess and osteomyelitis of the left heel and decubitus ulcer of the right hip. Plan for urgent below knee amputation of the left leg this afternoon. Surgical intervention of the right hip held due to protein calorie malnutrition. Initially received vancomycin, metronidazole and cefepime. TTE has been ordered and is pending.    Review of Systems: Review of Systems  Constitutional: Positive for chills and malaise/fatigue. Negative for fever and weight loss.  Respiratory: Negative for cough, shortness of breath and wheezing.   Cardiovascular: Negative for chest pain and leg swelling.  Gastrointestinal: Negative for abdominal pain, constipation, diarrhea, nausea and vomiting.  Musculoskeletal:       Positive for left foot pain.   Skin:  Negative for rash.     Past Medical History:  Diagnosis Date  . Bipolar 1 disorder ([place])   . Cancer [facility]) 2011   Germ Cell Seminoma   . Renal disorder     Social History   Tobacco Use  . Smoking status: Current Every Day Smoker    Packs/day: 0.50    Types: Cigarettes  . Smokeless tobacco: Never Used  Vaping Use  . Vaping Use: Never used  Substance  Use Topics  . Alcohol use: Yes  . Drug use: Yes    Types: Marijuana    Comment: States no longer uses marijuana because he is "on probation"    Family History  Problem Relation Age of Onset  . Dementia Mother     No Known Allergies  OBJECTIVE: Blood pressure 95/66, pulse 78, temperature (!) 101.4 F (38.6 C), temperature source Oral, resp. rate (!) 25, height 6' (1.829 m), weight 71 kg, SpO2 96 %.  Physical Exam Constitutional:      General: He is not in acute distress.    Appearance: He is well-developed. He is ill-appearing and diaphoretic.     Comments: Lethargic and arousable.   Cardiovascular:     Rate and Rhythm: Normal rate and regular rhythm.     Heart sounds: Normal heart sounds.  Pulmonary:     Effort: Pulmonary effort is normal.     Breath sounds: Normal breath sounds.  Musculoskeletal:     Comments: Edema and purulent drainage from the left calcaneus. Right hip with decubitus ulcer with surrounding cellulitis.   Skin:    General: Skin is warm.  Neurological:     Mental Status: He is oriented to person, place, and time.     Lab Results Lab Results  Component Value Date   WBC 17.5 (H) 04/16/2020   HGB 10.3 (L) 04/16/2020   HCT 31.1 (L) 04/16/2020   MCV 97.5 04/16/2020   PLT 274 04/16/2020    Lab Results  Component Value Date   CREATININE 0.87 04/16/2020   BUN 16 04/16/2020   NA 132 (L) 04/16/2020   K 3.2 (L) 04/16/2020   CL 104 04/16/2020   CO2 22 04/16/2020    Lab Results  Component Value Date   ALT 10 04/16/2020   AST 14 (L) 04/16/2020   ALKPHOS 51 04/16/2020   BILITOT 0.8 04/16/2020     Microbiology: Recent Results (from the past 240 hour(s))  Blood culture (routine x 2)     Status: None (Preliminary result)   Collection Time: 04/15/20  6:07 PM   Specimen: BLOOD  Result Value Ref Range Status   Specimen Description BLOOD BLOOD LEFT FOREARM  Final   Special Requests   Final    BOTTLES DRAWN AEROBIC AND ANAEROBIC Blood Culture results  may not be optimal due to an inadequate volume of blood received in culture bottles   Culture  Setup Time   Final    GRAM POSITIVE COCCI IN BOTH AEROBIC AND ANAEROBIC BOTTLES CRITICAL VALUE NOTED.  VALUE IS CONSISTENT WITH PREVIOUSLY REPORTED AND CALLED VALUE. Performed at [facility], [place] [address], [place], [place] [address]    Culture GRAM POSITIVE COCCI  Final   Report Status PENDING  Incomplete  Blood culture (routine x 2)     Status: None (Preliminary result)   Collection Time: 04/15/20  6:09 PM   Specimen: BLOOD LEFT HAND  Result Value Ref Range Status   Specimen Description BLOOD LEFT HAND  Final  Special Requests   Final    BOTTLES DRAWN AEROBIC AND ANAEROBIC Blood Culture results may not be optimal due to an inadequate volume of blood received in culture bottles   Culture  Setup Time   Final    GRAM POSITIVE COCCI IN BOTH AEROBIC AND ANAEROBIC BOTTLES CRITICAL RESULT CALLED TO, READ BACK BY AND VERIFIED WITH: PHARMD Romeo AT 1219 ON 04/16/2020 BY T.SAAD Performed at [facility], [place] [address], [place], [place] [address]    Culture GRAM POSITIVE COCCI  Final   Report Status PENDING  Incomplete  Blood Culture ID Panel (Reflexed)     Status: Abnormal   Collection Time: 04/15/20  6:09 PM  Result Value Ref Range Status   Enterococcus faecalis NOT DETECTED NOT DETECTED Final   Enterococcus Faecium NOT DETECTED NOT DETECTED Final   Listeria monocytogenes NOT DETECTED NOT DETECTED Final   Staphylococcus species DETECTED (A) NOT DETECTED Final    Comment: CRITICAL RESULT CALLED TO, READ BACK BY AND VERIFIED WITH: PHAMD S.GRUBER AT 1219 ON 04/16/2020 BY T.SAAD.    Staphylococcus aureus (BCID) DETECTED (A) NOT DETECTED Final    Comment: Methicillin (oxacillin)-resistant Staphylococcus aureus (MRSA). MRSA is predictably resistant to beta-lactam antibiotics (except ceftaroline). Preferred therapy is vancomycin unless clinically contraindicated. Patient requires  contact precautions if  hospitalized. CRITICAL RESULT CALLED TO, READ BACK BY AND VERIFIED WITH: PHAMD S.GRUBER AT 1219 ON 04/16/2020 BY T.SAAD.    Staphylococcus epidermidis NOT DETECTED NOT DETECTED Final   Staphylococcus lugdunensis NOT DETECTED NOT DETECTED Final   Streptococcus species NOT DETECTED NOT DETECTED Final   Streptococcus agalactiae NOT DETECTED NOT DETECTED Final   Streptococcus pneumoniae NOT DETECTED NOT DETECTED Final   Streptococcus pyogenes NOT DETECTED NOT DETECTED Final   A.calcoaceticus-baumannii NOT DETECTED NOT DETECTED Final   Bacteroides fragilis NOT DETECTED NOT DETECTED Final   Enterobacterales NOT DETECTED NOT DETECTED Final   Enterobacter cloacae complex NOT DETECTED NOT DETECTED Final   Escherichia coli NOT DETECTED NOT DETECTED Final   Klebsiella aerogenes NOT DETECTED NOT DETECTED Final   Klebsiella oxytoca NOT DETECTED NOT DETECTED Final   Klebsiella pneumoniae NOT DETECTED NOT DETECTED Final   Proteus species NOT DETECTED NOT DETECTED Final   Salmonella species NOT DETECTED NOT DETECTED Final   Serratia marcescens NOT DETECTED NOT DETECTED Final   Haemophilus influenzae NOT DETECTED NOT DETECTED Final   Neisseria meningitidis NOT DETECTED NOT DETECTED Final   Pseudomonas aeruginosa NOT DETECTED NOT DETECTED Final   Stenotrophomonas maltophilia NOT DETECTED NOT DETECTED Final   Candida albicans NOT DETECTED NOT DETECTED Final   Candida auris NOT DETECTED NOT DETECTED Final   Candida glabrata NOT DETECTED NOT DETECTED Final   Candida krusei NOT DETECTED NOT DETECTED Final   Candida parapsilosis NOT DETECTED NOT DETECTED Final   Candida tropicalis NOT DETECTED NOT DETECTED Final   Cryptococcus neoformans/gattii NOT DETECTED NOT DETECTED Final   Meth resistant mecA/C and MREJ DETECTED (A) NOT DETECTED Final    Comment: CRITICAL RESULT CALLED TO, READ BACK BY AND VERIFIED WITH: PHAMD S.GRUBER AT 1219 ON 04/16/2020 BY T.SAAD. Performed at [facility], [place] [address], [place], [place] [address]   Resp Panel by RT-PCR (Flu A&B, Covid) Nasopharyngeal Swab     Status: None   Collection Time: 04/15/20  6:26 PM   Specimen: Nasopharyngeal Swab; Nasopharyngeal(NP) swabs in vial transport medium  Result Value Ref Range Status   SARS Coronavirus 2 by RT PCR NEGATIVE NEGATIVE Final  Comment: (NOTE) SARS-CoV-2 target nucleic acids are NOT DETECTED.  The SARS-CoV-2 RNA is generally detectable in upper respiratory specimens during the acute phase of infection. The lowest concentration of SARS-CoV-2 viral copies this assay can detect is 138 copies/mL. A negative result does not preclude SARS-Cov-2 infection and should not be used as the sole basis for treatment or other patient management decisions. A negative result may occur with  improper specimen collection/handling, submission of specimen other than nasopharyngeal swab, presence of viral mutation(s) within the areas targeted by this assay, and inadequate number of viral copies(<138 copies/mL). A negative result must be combined with clinical observations, patient history, and epidemiological information. The expected result is Negative.  Fact Sheet for Patients:  EntrepreneurPulse.com.au  Fact Sheet for Healthcare Providers:  IncredibleEmployment.be  This test is no t yet approved or cleared by the [place] FDA and  has been authorized for detection and/or diagnosis of SARS-CoV-2 by FDA under an Emergency Use Authorization (EUA). This EUA will remain  in effect (meaning this test can be used) for the duration of the COVID-19 declaration under Section 564(b)(1) of the Act, 21 U.S.C.section 360bbb-3(b)(1), unless the authorization is terminated  or revoked sooner.       Influenza A by PCR NEGATIVE NEGATIVE Final   Influenza B by PCR NEGATIVE NEGATIVE Final    Comment: (NOTE) The Xpert Xpress SARS-CoV-2/FLU/RSV plus assay is intended  as an aid in the diagnosis of influenza from Nasopharyngeal swab specimens and should not be used as a sole basis for treatment. Nasal washings and aspirates are unacceptable for Xpert Xpress SARS-CoV-2/FLU/RSV testing.  Fact Sheet for Patients: EntrepreneurPulse.com.au  Fact Sheet for Healthcare Providers: IncredibleEmployment.be  This test is not yet approved or cleared by the [place] FDA and has been authorized for detection and/or diagnosis of SARS-CoV-2 by FDA under an Emergency Use Authorization (EUA). This EUA will remain in effect (meaning this test can be used) for the duration of the COVID-19 declaration under Section 564(b)(1) of the Act, 21 U.S.C. section 360bbb-3(b)(1), unless the authorization is terminated or revoked.  Performed at [facility], [place] [address] [place], [place] [address]      Terri Piedra, [facility]  04/16/2020  1:06 PM

## 2020-04-16 NOTE — ED Notes (Signed)
Pt transported to OR at this time  

## 2020-04-16 NOTE — ED Notes (Signed)
Wound to right hip cleansed with NS,  Dakins solution applied and wound dressed per order,

## 2020-04-16 NOTE — ED Notes (Signed)
Attempted to call report

## 2020-04-17 ENCOUNTER — Inpatient Hospital Stay (HOSPITAL_COMMUNITY): Payer: Self-pay

## 2020-04-17 ENCOUNTER — Encounter (HOSPITAL_COMMUNITY): Payer: Self-pay | Admitting: Orthopedic Surgery

## 2020-04-17 DIAGNOSIS — R509 Fever, unspecified: Secondary | ICD-10-CM

## 2020-04-17 DIAGNOSIS — F3174 Bipolar disorder, in full remission, most recent episode manic: Secondary | ICD-10-CM

## 2020-04-17 DIAGNOSIS — I361 Nonrheumatic tricuspid (valve) insufficiency: Secondary | ICD-10-CM

## 2020-04-17 DIAGNOSIS — Z89512 Acquired absence of left leg below knee: Secondary | ICD-10-CM

## 2020-04-17 LAB — BASIC METABOLIC PANEL
Anion gap: 5 (ref 5–15)
BUN: 16 mg/dL (ref 8–23)
CO2: 21 mmol/L — ABNORMAL LOW (ref 22–32)
Calcium: 7.7 mg/dL — ABNORMAL LOW (ref 8.9–10.3)
Chloride: 110 mmol/L (ref 98–111)
Creatinine, Ser: 0.64 mg/dL (ref 0.61–1.24)
GFR, Estimated: >60 mL/min (ref >60)
Glucose, Bld: 158 mg/dL — ABNORMAL HIGH (ref 70–99)
Potassium: 4.2 mmol/L (ref 3.5–5.1)
Sodium: 136 mmol/L (ref 135–145)

## 2020-04-17 LAB — ECHOCARDIOGRAM COMPLETE
Area-P 1/2: 4.06 cm2
Calc EF: 57.9 %
Height: 72 in
S' Lateral: 3.5 cm
Single Plane A2C EF: 62.4 %
Single Plane A4C EF: 54 %
Weight: 2504.43 oz

## 2020-04-17 LAB — CBC
HCT: 30.9 % — ABNORMAL LOW (ref 39.0–52.0)
Hemoglobin: 10.5 g/dL — ABNORMAL LOW (ref 13.0–17.0)
MCH: 32.6 pg (ref 26.0–34.0)
MCHC: 34 g/dL (ref 30.0–36.0)
MCV: 96 fL (ref 80.0–100.0)
Platelets: 292 10*3/uL (ref 150–400)
RBC: 3.22 MIL/uL — ABNORMAL LOW (ref 4.22–5.81)
RDW: 13.2 % (ref 11.5–15.5)
WBC: 11.8 10*3/uL — ABNORMAL HIGH (ref 4.0–10.5)
nRBC: 0 % (ref 0.0–0.2)

## 2020-04-17 LAB — GLUCOSE, CAPILLARY
Glucose-Capillary: 63 mg/dL — ABNORMAL LOW (ref 70–99)
Glucose-Capillary: 98 mg/dL (ref 70–99)

## 2020-04-17 LAB — LACTIC ACID, PLASMA: Lactic Acid, Venous: 1.1 mmol/L (ref 0.5–1.9)

## 2020-04-17 LAB — PROCALCITONIN: Procalcitonin: 0.97 ng/mL

## 2020-04-17 LAB — MAGNESIUM: Magnesium: 2 mg/dL (ref 1.7–2.4)

## 2020-04-17 LAB — PHOSPHORUS: Phosphorus: 2.5 mg/dL (ref 2.5–4.6)

## 2020-04-17 MED ORDER — ENOXAPARIN SODIUM 40 MG/0.4ML ~~LOC~~ SOLN
40.0000 mg | SUBCUTANEOUS | Status: DC
  Administered 2020-04-17 – 2020-05-09 (×23): 40 mg via SUBCUTANEOUS
  Filled 2020-04-17 (×23): qty 0.4

## 2020-04-17 MED ORDER — HYDROXYZINE HCL 25 MG PO TABS: 25.0000 mg | ORAL_TABLET | Freq: Three times a day (TID) | ORAL | Status: DC | PRN

## 2020-04-17 MED ORDER — MELATONIN 3 MG PO TABS
3.0000 mg | ORAL_TABLET | Freq: Every evening | ORAL | Status: DC | PRN
  Administered 2020-04-17: 3 mg via ORAL
  Filled 2020-04-17: qty 1

## 2020-04-17 MED ORDER — LORAZEPAM 1 MG PO TABS
1.0000 mg | ORAL_TABLET | ORAL | Status: AC | PRN
  Administered 2020-04-17: 1 mg via ORAL
  Administered 2020-04-17: 2 mg via ORAL
  Administered 2020-04-18: 1 mg via ORAL
  Administered 2020-04-19: 2 mg via ORAL
  Filled 2020-04-17 (×2): qty 1
  Filled 2020-04-17 (×2): qty 2

## 2020-04-17 MED ORDER — ENSURE ENLIVE PO LIQD
237.0000 mL | Freq: Three times a day (TID) | ORAL | Status: DC
  Administered 2020-04-17 – 2020-05-09 (×64): 237 mL via ORAL
  Filled 2020-04-17: qty 237

## 2020-04-17 MED ORDER — LEVETIRACETAM 500 MG PO TABS
500.0000 mg | ORAL_TABLET | Freq: Two times a day (BID) | ORAL | Status: DC
  Administered 2020-04-17 – 2020-05-09 (×46): 500 mg via ORAL
  Filled 2020-04-17 (×48): qty 1

## 2020-04-17 MED ORDER — HYDROMORPHONE HCL 1 MG/ML IJ SOLN
0.5000 mg | INTRAMUSCULAR | Status: DC | PRN
  Administered 2020-04-17 – 2020-04-18 (×4): 1 mg via INTRAVENOUS
  Filled 2020-04-17 (×4): qty 1

## 2020-04-17 MED ORDER — LITHIUM CARBONATE 300 MG PO CAPS
300.0000 mg | ORAL_CAPSULE | Freq: Two times a day (BID) | ORAL | Status: DC
  Administered 2020-04-17 – 2020-05-09 (×45): 300 mg via ORAL
  Filled 2020-04-17 (×48): qty 1

## 2020-04-17 MED ORDER — LACTATED RINGERS IV BOLUS
1000.0000 mL | INTRAVENOUS | Status: AC
  Administered 2020-04-17: 1000 mL via INTRAVENOUS

## 2020-04-17 MED ORDER — LORAZEPAM 2 MG/ML IJ SOLN
1.0000 mg | INTRAMUSCULAR | Status: AC | PRN
Start: 2020-04-17 — End: 2020-04-20

## 2020-04-17 NOTE — Evaluation (Signed)
Occupational Therapy Evaluation Patient Details Name: Andrew Chaney MRN: [id number] DOB: July 24, 1955 Today's Date: 04/17/2020    History of Present Illness 65 y.o. male with medical history significant of bipolar disorder homelessness recent calcaneus fracture, history of MSSA bacteremia, cannabis abuse, seizure disorder, psychosis, chronic hepatitis C presented 04/15/20 with left foot pain with purulent drainage and rt lateral hip pressure ulcer. 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 He demonstrates the below listed deficits and will benefit from continued OT to maximize safety and independence with BADLs.  Pt presents to OT with generalized weakness, increased pain, impaired balance, increased anxiety.  He required set up - max A for ADLs and min guard +2 for functional transfers.  He was highly anxious and fearful during eval thus requiring significant encouragement for participation.  He reports he was mod I with ADLs and functional mobility PTA, but also reports he is homeless.  Feel he will benefit from CIR level rehab at discharge.       Follow Up Recommendations  CIR;Supervision/Assistance - 24 hour    Equipment Recommendations  3 in 1 bedside commode;Wheelchair (measurements OT)    Recommendations for Other Services Rehab consult     Precautions / Restrictions Precautions Precautions: Fall Required Braces or Orthoses: Other Brace Other Brace: residual limb protector      Mobility Bed Mobility Overal bed mobility: Needs Assistance Bed Mobility: Supine to Sit     Supine to sit: Min guard     General bed mobility comments: with rail; incr time due to pain and anxiety; close guarding for safety, but no LOB    Transfers Overall transfer level: Needs assistance Equipment used: None Transfers: Squat Pivot Transfers     Squat pivot transfers: Min assist;+2 safety/equipment     General transfer comment: to pt's right with  armrest removed; pt stopped in squat position and required assist to pivot body over to chair; able to adjust himself back in chair with armrests and cues    Balance Overall balance assessment: Needs assistance Sitting-balance support: Single extremity supported;Feet supported Sitting balance-Leahy Scale: Poor Sitting balance - Comments: able to take cup in one hand and drink sitting at EOB; other arm in support position Postural control: Posterior lean     Standing balance comment: not attempted due to pt's level of anxiety                           ADL either performed or assessed with clinical judgement   ADL Overall ADL's : Needs assistance/impaired Eating/Feeding: Set up;Sitting   Grooming: Wash/dry hands;Wash/dry face;Oral care;Brushing hair;Moderate assistance;Sitting Grooming Details (indicate cue type and reason): Pt anxious and requires assist to complete Upper Body Bathing: Moderate assistance;Sitting   Lower Body Bathing: Moderate assistance;Bed level;Sitting/lateral leans   Upper Body Dressing : Moderate assistance;Sitting   Lower Body Dressing: Total assistance;Bed level   Toilet Transfer: +2 for safety/equipment;Stand-pivot;Minimal assistance;BSC   Toileting- Clothing Manipulation and Hygiene: Maximal assistance;Sit to/from stand       Functional mobility during ADLs: Minimal assistance;+2 for safety/equipment General ADL Comments: Pt requires significant encouragement to fully participate     Vision         Perception     Praxis      Pertinent Vitals/Pain Pain Assessment: Faces Faces Pain Scale: Hurts even more Pain Location: L BKA Pain Descriptors / Indicators: Guarding;Operative site guarding     Hand Dominance Right  Extremity/Trunk Assessment Upper Extremity Assessment Upper Extremity Assessment: Overall WFL for tasks assessed   Lower Extremity Assessment Lower Extremity Assessment: Defer to PT evaluation   Cervical / Trunk  Assessment Cervical / Trunk Assessment: Normal   Communication Communication Communication: No difficulties   Cognition Arousal/Alertness: Awake/alert Behavior During Therapy: Anxious Overall Cognitive Status: No family/caregiver present to determine baseline cognitive functioning                                 General Comments: highly anxious and does best with low tone of voice, low volume and explaining everything that is attached to him and what you plan to do   General Comments  Pt requires significant time and encouragement to participate as he is highly anxious re: BKA    Exercises     Shoulder Instructions      Home Living Family/patient expects to be discharged to:: Shelter/Homeless                                 Additional Comments: Pt reports he sleeps on the "concrete"      Prior Functioning/Environment Level of Independence: Independent        Comments: denies use of crutches or equipment PTA        OT Problem List: Decreased strength;Decreased activity tolerance;Impaired balance (sitting and/or standing);Decreased cognition;Decreased safety awareness;Decreased knowledge of use of DME or AE      OT Treatment/Interventions: Self-care/ADL training;DME and/or AE instruction;Therapeutic activities;Cognitive remediation/compensation;Patient/family education;Balance training    OT Goals(Current goals can be found in the care plan section) Acute Rehab OT Goals Patient Stated Goal: To go to sleep OT Goal Formulation: With patient Time For Goal Achievement: 05/01/20 Potential to Achieve Goals: Good ADL Goals Pt Will Perform Eating: (P) with modified independence;sitting Pt Will Perform Grooming: (P) with set-up;with adaptive equipment Pt Will Perform Upper Body Bathing: (P) with supervision;with set-up;sitting Pt Will Perform Lower Body Bathing: (P) with min assist;sit to/from stand Pt Will Perform Lower Body Dressing: (P) with min  assist;sit to/from stand;with adaptive equipment Pt Will Transfer to Toilet: (P) with supervision;stand pivot transfer;squat pivot transfer;bedside commode Pt Will Perform Toileting - Clothing Manipulation and hygiene: (P) with min assist;sit to/from stand  OT Frequency: Min 2X/week   Barriers to D/C:    Pt is homeless       Co-evaluation PT/OT/SLP Co-Evaluation/Treatment: Yes Reason for Co-Treatment: For patient/therapist safety;To address functional/ADL transfers   OT goals addressed during session: ADL's and self-care      AM-PAC OT "6 Clicks" Daily Activity     Outcome Measure Help from another person eating meals?: A Little Help from another person taking care of personal grooming?: A Lot Help from another person toileting, which includes using toliet, bedpan, or urinal?: A Lot Help from another person bathing (including washing, rinsing, drying)?: A Lot Help from another person to put on and taking off regular upper body clothing?: A Lot Help from another person to put on and taking off regular lower body clothing?: A Lot 6 Click Score: 13   End of Session Equipment Utilized During Treatment: Gait belt Nurse Communication: Mobility status  Activity Tolerance: Patient limited by pain;Other (comment) (anxiety) Patient left: in chair;with call bell/phone within reach;with chair alarm set  OT Visit Diagnosis: Unsteadiness on feet (R26.81);Hemiplegia and hemiparesis  Time: 0921-0949 OT Time Calculation (min): 28 min Charges:  OT General Charges $OT Visit: 1 Visit OT Evaluation $OT Eval Moderate Complexity: 1 Mod  Nilsa Nutting., OTR/L Acute Rehabilitation Services Pager [phone] Office [phone]   Lucille Passy M 04/17/2020, 3:52 PM

## 2020-04-17 NOTE — Progress Notes (Signed)
  Echocardiogram 2D Echocardiogram has been performed.  Andrew Chaney 04/17/2020, 2:55 PM

## 2020-04-17 NOTE — Evaluation (Signed)
Physical Therapy Evaluation Patient Details Name: Andrew Chaney MRN: [id number] DOB: 01/11/1956 Today's Date: 04/17/2020   History of Present Illness  65 y.o. male with medical history significant of bipolar disorder homelessness recent calcaneus fracture, history of MSSA bacteremia, cannabis abuse, seizure disorder, psychosis, chronic hepatitis C presented 04/15/20 with left foot pain with purulent drainage and rt lateral hip pressure ulcer. 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 Patient was homeless and living on the street PTA. He is very anxious and required incr time for explanations and repetition of information and answering his questions. He moved well once he felt comfortable/safe to do so. Anticipate good progress with mobility as long as pain is controlled.  Patient will benefit from skilled PT to increase their independence and safety with mobility to allow discharge to the venue listed below.       Follow Up Recommendations CIR    Equipment Recommendations  Wheelchair (measurements PT);Wheelchair cushion (measurements PT)    Recommendations for Other Services Rehab consult     Precautions / Restrictions Precautions Precautions: Fall Required Braces or Orthoses: Other Brace Other Brace: residual limb protector      Mobility  Bed Mobility Overal bed mobility: Needs Assistance Bed Mobility: Supine to Sit     Supine to sit: Min guard     General bed mobility comments: with rail; incr time due to pain and anxiety; close guarding for safety, but no LOB    Transfers Overall transfer level: Needs assistance Equipment used: None Transfers: Squat Pivot Transfers     Squat pivot transfers: Min assist;+2 safety/equipment     General transfer comment: to pt's right with armrest removed; pt stopped in squat position and required assist  to pivot body over to chair; able to adjust himself back in chair with armrests and cues  Ambulation/Gait                Stairs            Wheelchair Mobility    Modified Rankin (Stroke Patients Only)       Balance Overall balance assessment: Needs assistance Sitting-balance support: Single extremity supported;Feet supported Sitting balance-Leahy Scale: Poor Sitting balance - Comments: able to take cup in one hand and drink sitting at EOB; other arm in support position       Standing balance comment: not attempted due to pt's level of anxiety                             Pertinent Vitals/Pain Pain Assessment: Faces Faces Pain Scale: Hurts even more Pain Location: L BKA Pain Descriptors / Indicators: Guarding;Operative site guarding    Home Living Family/patient expects to be discharged to:: Shelter/Homeless                      Prior Function Level of Independence: Independent         Comments: denies use of crutches or equipment PTA     Hand Dominance   Dominant Hand: Right    Extremity/Trunk Assessment   Upper Extremity Assessment Upper Extremity Assessment: Defer to OT evaluation    Lower Extremity Assessment Lower Extremity Assessment: LLE deficits/detail LLE Deficits / Details: able to SLR for prosthetist to place shrinker and limb guard on LLE    Cervical / Trunk Assessment Cervical /  Trunk Assessment: Normal  Communication   Communication: No difficulties  Cognition Arousal/Alertness: Awake/alert Behavior During Therapy: Anxious Overall Cognitive Status: No family/caregiver present to determine baseline cognitive functioning                                 General Comments: highly anxious and does best with low tone of voice, low volume and explaining everything that is attached to him and what you plan to do      General Comments General comments (skin integrity, edema, etc.): increased time  due to anxiety and incr time (and repetition) explaining everything that is attached to him and that is happening    Exercises     Assessment/Plan    PT Assessment Patient needs continued PT services  PT Problem List Decreased range of motion;Decreased balance;Decreased mobility;Decreased cognition;Decreased knowledge of use of DME;Decreased knowledge of precautions;Pain;Decreased skin integrity       PT Treatment Interventions DME instruction;Gait training;Functional mobility training;Therapeutic activities;Balance training;Therapeutic exercise;Cognitive remediation;Patient/family education;Wheelchair mobility training    PT Goals (Current goals can be found in the Care Plan section)  Acute Rehab PT Goals Patient Stated Goal: get better PT Goal Formulation: With patient Time For Goal Achievement: 05/01/20 Potential to Achieve Goals: Good    Frequency Min 4X/week   Barriers to discharge Other (comment) homeless    Co-evaluation               AM-PAC PT "6 Clicks" Mobility  Outcome Measure Help needed turning from your back to your side while in a flat bed without using bedrails?: None Help needed moving from lying on your back to sitting on the side of a flat bed without using bedrails?: A Little Help needed moving to and from a bed to a chair (including a wheelchair)?: A Little Help needed standing up from a chair using your arms (e.g., wheelchair or bedside chair)?: Total Help needed to walk in hospital room?: Total Help needed climbing 3-5 steps with a railing? : Total 6 Click Score: 13    End of Session Equipment Utilized During Treatment: Other (comment) (residual limb protector) Activity Tolerance: Patient tolerated treatment well Patient left: with call bell/phone within reach;with chair alarm set Nurse Communication: Mobility status (squat-pivot to his right with upper rail in reach) PT Visit Diagnosis: Difficulty in walking, not elsewhere classified  (R26.2);Pain Pain - Right/Left: Left Pain - part of body: Leg    Time: 0920-0949 PT Time Calculation (min) (ACUTE ONLY): 29 min   Charges:   PT Evaluation $PT Eval Moderate Complexity: 1 Mod           Arby Barrette, PT Pager [phone]   Rexanne Mano 04/17/2020, 10:48 AM

## 2020-04-17 NOTE — Progress Notes (Signed)
eLink Physician-Brief Progress Note Patient Name: Andrew Chaney DOB: 01-Oct-1955 MRN: [id number]   Date of Service  04/17/2020  HPI/Events of Note  Patient request sleep aid.   eICU Interventions  Plan: 1. Melatonin 3 mg PO Q HS PRN sleep.     Intervention Category Major Interventions: Other:  Lysle Dingwall 04/17/2020, 8:16 PM

## 2020-04-17 NOTE — Consult Note (Addendum)
Progress note: epic not letting me change type   NAME:  Andrew Chaney, MRN:  [id number], DOB:  November 17, 1955, LOS: 2 ADMISSION DATE:  04/15/2020, CONSULTATION DATE:  04/15/2020 REFERRING MD:  Alexandria Lodge MD, CHIEF COMPLAINT:  Osteomyelitis   History of Present Illness:  Andrew Chaney is a 65 y.o. male, with a past medical history of bipolar disorder, homelessness, calcaneal fracture, and MSSA bacteremia in 2021, who presented to the [facility] ED with c/o body aches.   He was diagnosed with a left calcaneal fracture several months ago and has had trouble ambulating since. In the ED he was found to have osteomyelitis and orthopedic surgery was consulted. Broad spectrum antibiotics with fluid resuscitation was initiated on 04/05. PCCM was consulted for continued hypotension. He is curretly in the [facility] ED in critical condition.  Pertinent  Medical History  Bipolar disorder, MSSA Bacteremia, Seizure disorder, Cannabis abuse, chronic hep C, alcohol abuse, Smoking  Significant Hospital Events: Including procedures, antibiotic start and stop dates in addition to other pertinent events   . 4/5 Vanc,Flagyl (one dose) &  Cefepime. BC sent and already growing Christian. Seen by surg team w/ plan for left BKA and on-going obs/maximizing nutrition and wound care re: the right hip.  Marland Kitchen 4/6 PCCM consulted for fevers, and hypotension/septic shock.  . 4/6 L BKA, wound vac applied  Interim History / Subjective:  Labs improved Off pressors Wants more pain meds Eating breakfast  Objective   Blood pressure 100/64, pulse 65, temperature (!) 97.4 F (36.3 C), temperature source Axillary, resp. rate 11, height 6' (1.829 m), weight 71 kg, SpO2 92 %. on [facility]        Intake/Output Summary (Last 24 hours) at 04/17/2020 0802 Last data filed at 04/17/2020 0600 Gross per 24 hour  Intake 2199.51 ml  Output 700 ml  Net 1499.51 ml   Filed Weights   04/15/20 1800  Weight: 71 kg    Examination: Constitutional: chronically ill  appearing man on vent  Eyes: EOMI, pupils equal Ears, nose, mouth, and throat: MMM trachea midline Cardiovascular: RRR, ext warm Respiratory: Scattered rhonci, no wheezing Gastrointestinal: soft, +BS Skin: L BKA stump CDI with wound vac in place Neurologic: moves all 4 ext to command Psychiatric: RASS 0  Patient Lines/Drains/Airways Status    Active Line/Drains/Airways    Name Placement date Placement time Site Days   Arterial Line 04/16/20 Right Radial 04/16/20  1705  Radial  1   Peripheral IV 04/15/20 Left Forearm 04/15/20  2028  Forearm  2   Peripheral IV 04/16/20 Left Hand 04/16/20  1000  Hand  1   CVC Double Lumen 04/16/20 Left Subclavian 16 cm 04/16/20  1807  -- 1   Negative Pressure Wound Therapy Pretibial Left;Proximal 04/16/20  1708  --  1   Incision (Closed) 04/16/20 Leg Left 04/16/20  1708  -- 1   Pressure Injury 04/15/20 Hip Right Unstageable - Full thickness tissue loss in which the base of the injury is covered by slough (yellow, tan, gray, green or brown) and/or eschar (tan, brown or black) in the wound bed. 4x4 R hip  04/15/20  1900  -- 2           Labs/imaging that I havepersonally reviewed  (right click and "Reselect all SmartList Selections" daily)  CBC, BMP, Procal, Lactate, glucose, cultures, CXR  Resolved Hospital Problem list     Assessment & Plan:   Septic Shock due to Osteomyelitis of  Left heel s/p BKA R hip pressure  ulcer from sleeping on concrete (homeless) Severe Protein Caloric Malnutition H/o Seizure Disorder, Alcohol Abuse & Cigarrete Smoking Hepatitis C MRSA bacteremia - Wound vac care per Dr. Jess Barters team - PT/OT - DC central line/arterial line - CIWA scoring with PRN ativan, thiamine, folate - Check echo, would prefer we narrow abx but defer to ID (do not love cefepime in a patient with hx of seizures) - Does not seem like he was taking his AEDs, seizure hx probably just w/d seizures but will restart keppra for now - Local wound care to  R hip  Transfer to progressive, appreciate [facility] taking over care starting 04/18/20  Erskine Emery MD PCCM

## 2020-04-17 NOTE — Progress Notes (Signed)
PHARMACY - PHYSICIAN COMMUNICATION CRITICAL VALUE ALERT - BLOOD CULTURE IDENTIFICATION (BCID)  Andrew Chaney is an 65 y.o. male who presented to [facility] on [facility]/15/2020 with a chief complaint of MRSA bacteremia, osteomyelitis   Assessment: WBC trending down, now afebrile after starting antibiotics   Name of physician (or Provider) Contacted: [place]  Current antibiotics: Vancomycin 1,000mg  IV every 12 hours  Changes to prescribed antibiotics recommended:  Patient is on recommended antibiotics - No changes needed  Results for orders placed or performed during the hospital encounter of 04/15/20  Blood Culture ID Panel (Reflexed) (Collected: 04/15/2020  6:09 PM)  Result Value Ref Range   Enterococcus faecalis NOT DETECTED NOT DETECTED   Enterococcus Faecium NOT DETECTED NOT DETECTED   Listeria monocytogenes NOT DETECTED NOT DETECTED   Staphylococcus species DETECTED (A) NOT DETECTED   Staphylococcus aureus (BCID) DETECTED (A) NOT DETECTED   Staphylococcus epidermidis NOT DETECTED NOT DETECTED   Staphylococcus lugdunensis NOT DETECTED NOT DETECTED   Streptococcus species NOT DETECTED NOT DETECTED   Streptococcus agalactiae NOT DETECTED NOT DETECTED   Streptococcus pneumoniae NOT DETECTED NOT DETECTED   Streptococcus pyogenes NOT DETECTED NOT DETECTED   A.calcoaceticus-baumannii NOT DETECTED NOT DETECTED   Bacteroides fragilis NOT DETECTED NOT DETECTED   Enterobacterales NOT DETECTED NOT DETECTED   Enterobacter cloacae complex NOT DETECTED NOT DETECTED   Escherichia coli NOT DETECTED NOT DETECTED   Klebsiella aerogenes NOT DETECTED NOT DETECTED   Klebsiella oxytoca NOT DETECTED NOT DETECTED   Klebsiella pneumoniae NOT DETECTED NOT DETECTED   Proteus species NOT DETECTED NOT DETECTED   Salmonella species NOT DETECTED NOT DETECTED   Serratia marcescens NOT DETECTED NOT DETECTED   Haemophilus influenzae NOT DETECTED NOT DETECTED   Neisseria meningitidis NOT DETECTED NOT DETECTED    Pseudomonas aeruginosa NOT DETECTED NOT DETECTED   Stenotrophomonas maltophilia NOT DETECTED NOT DETECTED   Candida albicans NOT DETECTED NOT DETECTED   Candida auris NOT DETECTED NOT DETECTED   Candida glabrata NOT DETECTED NOT DETECTED   Candida krusei NOT DETECTED NOT DETECTED   Candida parapsilosis NOT DETECTED NOT DETECTED   Candida tropicalis NOT DETECTED NOT DETECTED   Cryptococcus neoformans/gattii NOT DETECTED NOT DETECTED   Meth resistant mecA/C and MREJ DETECTED (A) NOT Robertson, PharmD PGY1 Acute Care Pharmacy Resident Please refer to [facility] for unit-specific pharmacist

## 2020-04-17 NOTE — Progress Notes (Signed)
[facility] for Infectious Disease  Date of Admission:  04/15/2020     Total days of antibiotics 3         ASSESSMENT:  Andrew Chaney is POD #1 from left below knee amputation and now off vasopressors and has defervesced. Clinically appearing much better. Repeat blood cultures obtained earlier this morning and pending. Source control likely achieved with BKA although does have decubitus ulcer on right hip. Discontinue cefepime and metronidazole. TTE pending and likely need TEE to rule out endocarditis. Continue current dose of vancomycin. Disposition remains a challenge as he will require a prolonged course of antibiotics with duration to be determined by TTE/TEE. Wound care per orthopedic recommendations.   PLAN:  1. Continue vancomycin 2. Monitor repeat cultures for clearance of bacteremia. 3. Await TTE results and will likely need TEE.  4. Hold any central line placement pending clearance of blood cultures.  5. Evaluate disposition and alternatives for treatment as appropriate.  6. Wound care per orthopedic recommendations.   Active Problems:   Hyponatremia   Chronic hepatitis C (HCC)   Bipolar disorder, most recent episode manic (HCC)   Subacute osteomyelitis, left ankle and foot (HCC)   Septic shock (HCC)   Cutaneous abscess of left foot   . (feeding supplement) PROSource Plus  30 mL Oral BID BM  . vitamin C  1,000 mg Oral Daily  . Chlorhexidine Gluconate Cloth  6 each Topical Daily  . cyanocobalamin  1,000 mcg Intramuscular Daily   Followed by  . [START ON 04/19/2020] cyanocobalamin  1,000 mcg Intramuscular Weekly  . docusate sodium  100 mg Oral Daily  . feeding supplement  237 mL Oral TID BM  . folic acid  1 mg Oral Daily  . levETIRAcetam  500 mg Oral BID  . multivitamin with minerals  1 tablet Oral Daily  . pantoprazole  40 mg Oral Daily  . potassium chloride  40 mEq Oral Once  . senna  1 tablet Oral BID  . sodium chloride flush  10-40 mL Intracatheter Q12H  .  sodium hypochlorite   Irrigation BID  . thiamine injection  100 mg Intravenous Daily  . zinc sulfate  220 mg Oral Daily    SUBJECTIVE:  Afebrile overnight with no acute events. Able to be weaned off pressors following left BKA. Curious as to how he got this infection. Having some left lower extremity pain.  No Known Allergies   Review of Systems: Review of Systems  Constitutional: Negative for chills, fever and weight loss.  Respiratory: Negative for cough, shortness of breath and wheezing.   Cardiovascular: Negative for chest pain and leg swelling.  Gastrointestinal: Negative for abdominal pain, constipation, diarrhea, nausea and vomiting.  Musculoskeletal:       Positive for left lower extremity pain.  Skin: Negative for rash.      OBJECTIVE: Vitals:   04/17/20 0400 04/17/20 0500 04/17/20 0600 04/17/20 0743  BP:      Pulse: 75 80 65   Resp: 16 18 11    Temp:    (!) 97.4 F (36.3 C)  TempSrc:    Axillary  SpO2: 94% 93% 92%   Weight:      Height:       Body mass index is 21.23 kg/m.  Physical Exam Constitutional:      General: He is not in acute distress.    Appearance: He is well-developed.     Comments: Seated in the chair next to the bed; interactive.  Cardiovascular:  Rate and Rhythm: Normal rate and regular rhythm.     Heart sounds: Normal heart sounds.  Pulmonary:     Effort: Pulmonary effort is normal.     Breath sounds: Normal breath sounds.  Musculoskeletal:     Comments: Left below knee amputation with sleeve in place.   Skin:    General: Skin is warm and dry.  Neurological:     Mental Status: He is alert and oriented to person, place, and time.     Lab Results Lab Results  Component Value Date   WBC 11.8 (H) 04/17/2020   HGB 10.5 (L) 04/17/2020   HCT 30.9 (L) 04/17/2020   MCV 96.0 04/17/2020   PLT 292 04/17/2020    Lab Results  Component Value Date   CREATININE 0.64 04/17/2020   BUN 16 04/17/2020   NA 136 04/17/2020   K 4.2  04/17/2020   CL 110 04/17/2020   CO2 21 (L) 04/17/2020    Lab Results  Component Value Date   ALT 10 04/16/2020   AST 14 (L) 04/16/2020   ALKPHOS 51 04/16/2020   BILITOT 0.8 04/16/2020     Microbiology: Recent Results (from the past 240 hour(s))  Blood culture (routine x 2)     Status: Abnormal (Preliminary result)   Collection Time: 04/15/20  6:07 PM   Specimen: BLOOD  Result Value Ref Range Status   Specimen Description BLOOD BLOOD LEFT FOREARM  Final   Special Requests   Final    BOTTLES DRAWN AEROBIC AND ANAEROBIC Blood Culture results may not be optimal due to an inadequate volume of blood received in culture bottles   Culture  Setup Time   Final    GRAM POSITIVE COCCI IN BOTH AEROBIC AND ANAEROBIC BOTTLES CRITICAL VALUE NOTED.  VALUE IS CONSISTENT WITH PREVIOUSLY REPORTED AND CALLED VALUE. Performed at [facility], [place] [address], [place], [place] [address]    Culture STAPHYLOCOCCUS AUREUS (A)  Final   Report Status PENDING  Incomplete  Blood culture (routine x 2)     Status: Abnormal (Preliminary result)   Collection Time: 04/15/20  6:09 PM   Specimen: BLOOD LEFT HAND  Result Value Ref Range Status   Specimen Description BLOOD LEFT HAND  Final   Special Requests   Final    BOTTLES DRAWN AEROBIC AND ANAEROBIC Blood Culture results may not be optimal due to an inadequate volume of blood received in culture bottles   Culture  Setup Time   Final    GRAM POSITIVE COCCI IN BOTH AEROBIC AND ANAEROBIC BOTTLES CRITICAL RESULT CALLED TO, READ BACK BY AND VERIFIED WITH: PHARMD Staley AT 1219 ON 04/16/2020 BY T.SAAD Performed at [facility], [place] [address] [place], [place] [address]    Culture STAPHYLOCOCCUS AUREUS (A)  Final   Report Status PENDING  Incomplete  Blood Culture ID Panel (Reflexed)     Status: Abnormal   Collection Time: 04/15/20  6:09 PM  Result Value Ref Range Status   Enterococcus faecalis NOT DETECTED NOT DETECTED Final   Enterococcus  Faecium NOT DETECTED NOT DETECTED Final   Listeria monocytogenes NOT DETECTED NOT DETECTED Final   Staphylococcus species DETECTED (A) NOT DETECTED Final    Comment: CRITICAL RESULT CALLED TO, READ BACK BY AND VERIFIED WITH: PHAMD S.GRUBER AT 1219 ON 04/16/2020 BY T.SAAD.    Staphylococcus aureus (BCID) DETECTED (A) NOT DETECTED Final    Comment: Methicillin (oxacillin)-resistant Staphylococcus aureus (MRSA). MRSA is predictably resistant to beta-lactam antibiotics (except  ceftaroline). Preferred therapy is vancomycin unless clinically contraindicated. Patient requires contact precautions if  hospitalized. CRITICAL RESULT CALLED TO, READ BACK BY AND VERIFIED WITH: PHAMD S.GRUBER AT 1219 ON 04/16/2020 BY T.SAAD.    Staphylococcus epidermidis NOT DETECTED NOT DETECTED Final   Staphylococcus lugdunensis NOT DETECTED NOT DETECTED Final   Streptococcus species NOT DETECTED NOT DETECTED Final   Streptococcus agalactiae NOT DETECTED NOT DETECTED Final   Streptococcus pneumoniae NOT DETECTED NOT DETECTED Final   Streptococcus pyogenes NOT DETECTED NOT DETECTED Final   A.calcoaceticus-baumannii NOT DETECTED NOT DETECTED Final   Bacteroides fragilis NOT DETECTED NOT DETECTED Final   Enterobacterales NOT DETECTED NOT DETECTED Final   Enterobacter cloacae complex NOT DETECTED NOT DETECTED Final   Escherichia coli NOT DETECTED NOT DETECTED Final   Klebsiella aerogenes NOT DETECTED NOT DETECTED Final   Klebsiella oxytoca NOT DETECTED NOT DETECTED Final   Klebsiella pneumoniae NOT DETECTED NOT DETECTED Final   Proteus species NOT DETECTED NOT DETECTED Final   Salmonella species NOT DETECTED NOT DETECTED Final   Serratia marcescens NOT DETECTED NOT DETECTED Final   Haemophilus influenzae NOT DETECTED NOT DETECTED Final   Neisseria meningitidis NOT DETECTED NOT DETECTED Final   Pseudomonas aeruginosa NOT DETECTED NOT DETECTED Final   Stenotrophomonas maltophilia NOT DETECTED NOT DETECTED Final    Candida albicans NOT DETECTED NOT DETECTED Final   Candida auris NOT DETECTED NOT DETECTED Final   Candida glabrata NOT DETECTED NOT DETECTED Final   Candida krusei NOT DETECTED NOT DETECTED Final   Candida parapsilosis NOT DETECTED NOT DETECTED Final   Candida tropicalis NOT DETECTED NOT DETECTED Final   Cryptococcus neoformans/gattii NOT DETECTED NOT DETECTED Final   Meth resistant mecA/C and MREJ DETECTED (A) NOT DETECTED Final    Comment: CRITICAL RESULT CALLED TO, READ BACK BY AND VERIFIED WITH: PHAMD S.GRUBER AT 1219 ON 04/16/2020 BY T.SAAD. Performed at [facility], [place] [address], [place], [place] [address]   Resp Panel by RT-PCR (Flu A&B, Covid) Nasopharyngeal Swab     Status: None   Collection Time: 04/15/20  6:26 PM   Specimen: Nasopharyngeal Swab; Nasopharyngeal(NP) swabs in vial transport medium  Result Value Ref Range Status   SARS Coronavirus 2 by RT PCR NEGATIVE NEGATIVE Final    Comment: (NOTE) SARS-CoV-2 target nucleic acids are NOT DETECTED.  The SARS-CoV-2 RNA is generally detectable in upper respiratory specimens during the acute phase of infection. The lowest concentration of SARS-CoV-2 viral copies this assay can detect is 138 copies/mL. A negative result does not preclude SARS-Cov-2 infection and should not be used as the sole basis for treatment or other patient management decisions. A negative result may occur with  improper specimen collection/handling, submission of specimen other than nasopharyngeal swab, presence of viral mutation(s) within the areas targeted by this assay, and inadequate number of viral copies(<138 copies/mL). A negative result must be combined with clinical observations, patient history, and epidemiological information. The expected result is Negative.  Fact Sheet for Patients:  EntrepreneurPulse.com.au  Fact Sheet for Healthcare Providers:  IncredibleEmployment.be  This test is no t yet  approved or cleared by the [place] FDA and  has been authorized for detection and/or diagnosis of SARS-CoV-2 by FDA under an Emergency Use Authorization (EUA). This EUA will remain  in effect (meaning this test can be used) for the duration of the COVID-19 declaration under Section 564(b)(1) of the Act, 21 U.S.C.section 360bbb-3(b)(1), unless the authorization is terminated  or revoked sooner.  Influenza A by PCR NEGATIVE NEGATIVE Final   Influenza B by PCR NEGATIVE NEGATIVE Final    Comment: (NOTE) The Xpert Xpress SARS-CoV-2/FLU/RSV plus assay is intended as an aid in the diagnosis of influenza from Nasopharyngeal swab specimens and should not be used as a sole basis for treatment. Nasal washings and aspirates are unacceptable for Xpert Xpress SARS-CoV-2/FLU/RSV testing.  Fact Sheet for Patients: EntrepreneurPulse.com.au  Fact Sheet for Healthcare Providers: IncredibleEmployment.be  This test is not yet approved or cleared by the [place] FDA and has been authorized for detection and/or diagnosis of SARS-CoV-2 by FDA under an Emergency Use Authorization (EUA). This EUA will remain in effect (meaning this test can be used) for the duration of the COVID-19 declaration under Section 564(b)(1) of the Act, 21 U.S.C. section 360bbb-3(b)(1), unless the authorization is terminated or revoked.  Performed at [facility], [place] [address], [place], [place] [address]   MRSA PCR Screening     Status: None   Collection Time: 04/16/20  8:58 PM   Specimen: Nasopharyngeal  Result Value Ref Range Status   MRSA by PCR NEGATIVE NEGATIVE Final    Comment:        The GeneXpert MRSA Assay (FDA approved for NASAL specimens only), is one component of a comprehensive MRSA colonization surveillance program. It is not intended to diagnose MRSA infection nor to guide or monitor treatment for MRSA infections. Performed at [facility], [place] [address] [place], [place] [address]      Terri Piedra, [place] Infectious [facility]  04/17/2020  11:17 AM

## 2020-04-17 NOTE — Progress Notes (Signed)
Patient is postop day 1 status post left transtibial amputation.  He is resting in bed.  He answers minimal questions but does seem mildly confused.  He is comfortable.  Has questions about his leg.  Vital signs are stabilizing.  His white count is down to 11 this morning from 17.  Inflammatory markers also stand stabilizing.  Wound VAC has 0 cc in the canister 1 green check   We will continue to follow.  Patient seems much better than on admission.  Obviously placement will be difficult.

## 2020-04-17 NOTE — Progress Notes (Signed)
Spoke to family.  Longstanding homelessness, intermittent prison time, near constant alcohol and drug abuse when not in prison.  Bipolar 1 with no med compliance unless institutionalized (does not remember medication regimen).  Father did well with lithium.  Will start lithium and consult psychiatry to help with dosing regimen (or alternative agents at this discretion).  Erskine Emery MD PCCM
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
## 2020-04-17 NOTE — Progress Notes (Addendum)
Initial Nutrition Assessment  DOCUMENTATION CODES:   Not applicable  INTERVENTION:    Ensure Enlive po TID, each supplement provides 350 kcal and 20 grams of protein.  Continue MVI with minerals daily.  Continue vitamin C and zinc sulfate to promote wound healing.  Continue Prosource Plus 30 ml PO BID, each packet provides 100 kcal and 15 gm protein  Recommend liberalize diet to regular, no hx of DM noted.  NUTRITION DIAGNOSIS:   Increased nutrient needs related to wound healing as evidenced by estimated needs.  GOAL:   Patient will meet greater than or equal to 90% of their needs  MONITOR:   PO intake,Supplement acceptance,Skin  REASON FOR ASSESSMENT:   Consult Assessment of nutrition requirement/status  ASSESSMENT:   66 yo male admitted with left foot pain, osteomyelitis. PMH includes bipolar disorder, homelessness, recent calcaneous fracture, MSSA bacteremia, cannabis abuse, seizure disorder, psychosis, chronic hepatitis C.   S/P L BKA 4/6.  Wound VAC in place.    Labs reviewed.   Medications reviewed and include vitamin C, vitamin D-35, Colace, folic acid, Keppra, MVI with minerals, Klor-Con, Senokot, thiamine, zinc sulfate.  Currently on a carbohydrate modified diet.  No hx diabetes noted. CBG not being checked. Recommend change to regular diet.   NUTRITION - FOCUSED PHYSICAL EXAM:  unable to complete  Diet Order:   Diet Order            Diet Carb Modified Fluid consistency: Thin; Room service appropriate? Yes  Diet effective now                 EDUCATION NEEDS:   Not appropriate for education at this time  Skin:  Skin Assessment: Skin Integrity Issues: Skin Integrity Issues:: Other (Comment),Wound VAC Wound Vac: L BKA site Other: R hip wound  Last BM:  no BM documented  Height:   Ht Readings from Last 1 Encounters:  04/15/20 6' (1.829 m)    Weight:   Wt Readings from Last 1 Encounters:  04/15/20 71 kg  prior to  amputation  Ideal Body Weight:  80.9 kg (75.6 kg after amputation)  BMI:  Body mass index is 21.23 kg/m.  Estimated Nutritional Needs:   Kcal:  2300-2500  Protein:  130-140 gm  Fluid:  >/= 2.3 L    Lucas Mallow, RD, LDN, CNSC Please refer to Amion for contact information.

## 2020-04-18 LAB — CBC
HCT: 30.9 % — ABNORMAL LOW (ref 39.0–52.0)
Hemoglobin: 10.3 g/dL — ABNORMAL LOW (ref 13.0–17.0)
MCH: 32.3 pg (ref 26.0–34.0)
MCHC: 33.3 g/dL (ref 30.0–36.0)
MCV: 96.9 fL (ref 80.0–100.0)
Platelets: 357 10*3/uL (ref 150–400)
RBC: 3.19 MIL/uL — ABNORMAL LOW (ref 4.22–5.81)
RDW: 13.1 % (ref 11.5–15.5)
WBC: 16.2 10*3/uL — ABNORMAL HIGH (ref 4.0–10.5)
nRBC: 0 % (ref 0.0–0.2)

## 2020-04-18 LAB — BASIC METABOLIC PANEL
Anion gap: 4 — ABNORMAL LOW (ref 5–15)
BUN: 21 mg/dL (ref 8–23)
CO2: 23 mmol/L (ref 22–32)
Calcium: 8 mg/dL — ABNORMAL LOW (ref 8.9–10.3)
Chloride: 110 mmol/L (ref 98–111)
Creatinine, Ser: 0.79 mg/dL (ref 0.61–1.24)
GFR, Estimated: >60 mL/min (ref >60)
Glucose, Bld: 170 mg/dL — ABNORMAL HIGH (ref 70–99)
Potassium: 4.6 mmol/L (ref 3.5–5.1)
Sodium: 137 mmol/L (ref 135–145)

## 2020-04-18 LAB — CULTURE, BLOOD (ROUTINE X 2)

## 2020-04-18 LAB — PROCALCITONIN: Procalcitonin: 0.73 ng/mL

## 2020-04-18 LAB — SURGICAL PATHOLOGY

## 2020-04-18 LAB — VANCOMYCIN, PEAK: Vancomycin Pk: 25 ug/mL — ABNORMAL LOW (ref 30–40)

## 2020-04-18 MED ORDER — HYDROXYZINE HCL 25 MG PO TABS
50.0000 mg | ORAL_TABLET | Freq: Every day | ORAL | Status: DC
  Administered 2020-04-18 – 2020-05-08 (×21): 50 mg via ORAL
  Filled 2020-04-18 (×21): qty 2

## 2020-04-18 MED ORDER — MELATONIN 5 MG PO TABS
10.0000 mg | ORAL_TABLET | Freq: Every evening | ORAL | Status: DC | PRN
  Administered 2020-04-19 – 2020-04-22 (×2): 10 mg via ORAL
  Filled 2020-04-18 (×4): qty 2

## 2020-04-18 NOTE — Progress Notes (Signed)
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 Blood cultures from 4/7 have remained without growth to date and TTE without evidence of endocarditis. Will need TEE. Planning for at least 2 weeks of antibiotic therapy pending TEE results which may extend needed duration. Evaluated by Psychiatry with medication adjustments. Continue wound care per orthopedics.   PLAN:  1. Continue vancomycin 2. Recommend TEE 3. Monitor cultures for clearance of bacteremia.  4. Continue wound care per orthopedics. 5. Disposition and final recommendations pending TEE.   Dr. Tommy Medal is available over the weekend for any ID related concerns or questions.   Active Problems:   Hyponatremia   Chronic hepatitis C (HCC)   Bipolar disorder, most recent episode manic (HCC)   Subacute osteomyelitis, left ankle and foot (HCC)   Septic shock (HCC)   Cutaneous abscess of left foot   . (feeding supplement) PROSource Plus  30 mL Oral BID BM  . vitamin C  1,000 mg Oral Daily  . Chlorhexidine Gluconate Cloth  6 each Topical Daily  . [START ON 04/19/2020] cyanocobalamin  1,000 mcg Intramuscular Weekly  . docusate sodium  100 mg Oral Daily  . enoxaparin (LOVENOX) injection  40 mg Subcutaneous Q24H  . feeding supplement  237 mL Oral TID BM  . folic acid  1 mg Oral Daily  . hydrOXYzine  50 mg Oral QHS  . levETIRAcetam  500 mg Oral BID  . lithium carbonate  300 mg Oral BID WC  . multivitamin with minerals  1 tablet Oral Daily  . pantoprazole  40 mg Oral Daily  . senna  1 tablet Oral BID  . sodium chloride flush  10-40 mL Intracatheter Q12H  . sodium hypochlorite   Irrigation BID  . thiamine injection  100 mg Intravenous Daily  . zinc sulfate  220 mg Oral Daily    SUBJECTIVE:  Afebrile overnight with no acute events. Not feeling well today.  Denies fevers.   No Known Allergies   Review of Systems: Review of Systems  Constitutional: Negative for chills, fever and weight loss.  Respiratory: Negative for cough, shortness of breath and wheezing.   Cardiovascular: Negative for chest pain and leg swelling.  Gastrointestinal: Negative for abdominal pain, constipation, diarrhea, nausea and vomiting.  Skin: Negative for rash.      OBJECTIVE: Vitals:   04/18/20 0700 04/18/20 0731 04/18/20 0800 04/18/20 0900  BP: (!) 83/71  96/67 98/64  Pulse: 90  87 95  Resp:   (!) 9 19  Temp:  (!) 97.5 F (36.4 C)    TempSrc:  Oral    SpO2:   91% 93%  Weight:      Height:       Body mass index is 21.23 kg/m.  Physical Exam Constitutional:      General: He is not in acute distress.    Appearance: He is well-developed.     Comments: Lying in bed with head of bed elevated  Cardiovascular:     Rate and Rhythm: Normal rate and regular rhythm.     Heart sounds: Normal heart sounds.  Pulmonary:     Effort: Pulmonary effort is normal.     Breath sounds: Normal breath sounds.  Skin:    General: Skin is warm and dry.  Neurological:     Mental Status: He is alert and oriented to person, place, and time.     Lab Results Lab Results  Component Value Date   WBC 16.2 (H) 04/18/2020   HGB 10.3 (L) 04/18/2020   HCT 30.9 (L) 04/18/2020   MCV 96.9 04/18/2020   PLT 357 04/18/2020    Lab Results  Component Value Date   CREATININE 0.79 04/18/2020   BUN 21 04/18/2020   NA 137 04/18/2020   K 4.6 04/18/2020   CL 110 04/18/2020   CO2 23 04/18/2020    Lab Results  Component Value Date   ALT 10 04/16/2020   AST 14 (L) 04/16/2020   ALKPHOS 51 04/16/2020   BILITOT 0.8 04/16/2020     Microbiology: Recent Results (from the past 240 hour(s))  Blood culture (routine x 2)     Status: Abnormal   Collection Time: 04/15/20  6:07 PM   Specimen: BLOOD  Result Value Ref Range Status   Specimen Description BLOOD BLOOD LEFT FOREARM  Final    Special Requests   Final    BOTTLES DRAWN AEROBIC AND ANAEROBIC Blood Culture results may not be optimal due to an inadequate volume of blood received in culture bottles   Culture  Setup Time   Final    GRAM POSITIVE COCCI IN BOTH AEROBIC AND ANAEROBIC BOTTLES CRITICAL VALUE NOTED.  VALUE IS CONSISTENT WITH PREVIOUSLY REPORTED AND CALLED VALUE.    Culture (A)  Final    STAPHYLOCOCCUS AUREUS SUSCEPTIBILITIES PERFORMED ON PREVIOUS CULTURE WITHIN THE LAST 5 DAYS. Performed at [facility], [place] [address], [place], [place] [address]    Report Status 04/18/2020 FINAL  Final  Blood culture (routine x 2)     Status: Abnormal   Collection Time: 04/15/20  6:09 PM   Specimen: BLOOD LEFT HAND  Result Value Ref Range Status   Specimen Description BLOOD LEFT HAND  Final   Special Requests   Final    BOTTLES DRAWN AEROBIC AND ANAEROBIC Blood Culture results may not be optimal due to an inadequate volume of blood received in culture bottles   Culture  Setup Time   Final    GRAM POSITIVE COCCI IN BOTH AEROBIC AND ANAEROBIC BOTTLES CRITICAL RESULT CALLED TO, READ BACK BY AND VERIFIED WITH: PHARMD [place] AT 1219 ON 04/16/2020 BY T.SAAD Performed at [facility], [place] [address], [place], [place] [address]    Culture METHICILLIN RESISTANT STAPHYLOCOCCUS AUREUS (A)  Final   Report Status 04/18/2020 FINAL  Final   Organism ID, Bacteria METHICILLIN RESISTANT STAPHYLOCOCCUS AUREUS  Final      Susceptibility   Methicillin resistant staphylococcus aureus - MIC*    CIPROFLOXACIN >=8 RESISTANT Resistant     ERYTHROMYCIN >=8 RESISTANT Resistant     GENTAMICIN <=0.5 SENSITIVE Sensitive     OXACILLIN >=4 RESISTANT Resistant     TETRACYCLINE <=1 SENSITIVE Sensitive     VANCOMYCIN <=0.5 SENSITIVE Sensitive     TRIMETH/SULFA >=320 RESISTANT Resistant     CLINDAMYCIN <=0.25 SENSITIVE Sensitive     RIFAMPIN <=0.5 SENSITIVE Sensitive     Inducible Clindamycin NEGATIVE Sensitive     * METHICILLIN  RESISTANT STAPHYLOCOCCUS AUREUS  Blood Culture ID Panel (Reflexed)     Status: Abnormal   Collection Time: 04/15/20  6:09 PM  Result Value Ref Range Status   Enterococcus faecalis NOT DETECTED NOT DETECTED Final   Enterococcus Faecium NOT DETECTED NOT DETECTED Final   Listeria monocytogenes NOT DETECTED NOT  DETECTED Final   Staphylococcus species DETECTED (A) NOT DETECTED Final    Comment: CRITICAL RESULT CALLED TO, READ BACK BY AND VERIFIED WITH: PHAMD S.GRUBER AT 1219 ON 04/16/2020 BY T.SAAD.    Staphylococcus aureus (BCID) DETECTED (A) NOT DETECTED Final    Comment: Methicillin (oxacillin)-resistant Staphylococcus aureus (MRSA). MRSA is predictably resistant to beta-lactam antibiotics (except ceftaroline). Preferred therapy is vancomycin unless clinically contraindicated. Patient requires contact precautions if  hospitalized. CRITICAL RESULT CALLED TO, READ BACK BY AND VERIFIED WITH: PHAMD S.GRUBER AT 1219 ON 04/16/2020 BY T.SAAD.    Staphylococcus epidermidis NOT DETECTED NOT DETECTED Final   Staphylococcus lugdunensis NOT DETECTED NOT DETECTED Final   Streptococcus species NOT DETECTED NOT DETECTED Final   Streptococcus agalactiae NOT DETECTED NOT DETECTED Final   Streptococcus pneumoniae NOT DETECTED NOT DETECTED Final   Streptococcus pyogenes NOT DETECTED NOT DETECTED Final   A.calcoaceticus-baumannii NOT DETECTED NOT DETECTED Final   Bacteroides fragilis NOT DETECTED NOT DETECTED Final   Enterobacterales NOT DETECTED NOT DETECTED Final   Enterobacter cloacae complex NOT DETECTED NOT DETECTED Final   Escherichia coli NOT DETECTED NOT DETECTED Final   Klebsiella aerogenes NOT DETECTED NOT DETECTED Final   Klebsiella oxytoca NOT DETECTED NOT DETECTED Final   Klebsiella pneumoniae NOT DETECTED NOT DETECTED Final   Proteus species NOT DETECTED NOT DETECTED Final   Salmonella species NOT DETECTED NOT DETECTED Final   Serratia marcescens NOT DETECTED NOT DETECTED Final    Haemophilus influenzae NOT DETECTED NOT DETECTED Final   Neisseria meningitidis NOT DETECTED NOT DETECTED Final   Pseudomonas aeruginosa NOT DETECTED NOT DETECTED Final   Stenotrophomonas maltophilia NOT DETECTED NOT DETECTED Final   Candida albicans NOT DETECTED NOT DETECTED Final   Candida auris NOT DETECTED NOT DETECTED Final   Candida glabrata NOT DETECTED NOT DETECTED Final   Candida krusei NOT DETECTED NOT DETECTED Final   Candida parapsilosis NOT DETECTED NOT DETECTED Final   Candida tropicalis NOT DETECTED NOT DETECTED Final   Cryptococcus neoformans/gattii NOT DETECTED NOT DETECTED Final   Meth resistant mecA/C and MREJ DETECTED (A) NOT DETECTED Final    Comment: CRITICAL RESULT CALLED TO, READ BACK BY AND VERIFIED WITH: PHAMD S.GRUBER AT 1219 ON 04/16/2020 BY T.SAAD. Performed at [facility], [place] [address], [place], [place] [address]   Resp Panel by RT-PCR (Flu A&B, Covid) Nasopharyngeal Swab     Status: None   Collection Time: 04/15/20  6:26 PM   Specimen: Nasopharyngeal Swab; Nasopharyngeal(NP) swabs in vial transport medium  Result Value Ref Range Status   SARS Coronavirus 2 by RT PCR NEGATIVE NEGATIVE Final    Comment: (NOTE) SARS-CoV-2 target nucleic acids are NOT DETECTED.  The SARS-CoV-2 RNA is generally detectable in upper respiratory specimens during the acute phase of infection. The lowest concentration of SARS-CoV-2 viral copies this assay can detect is 138 copies/mL. A negative result does not preclude SARS-Cov-2 infection and should not be used as the sole basis for treatment or other patient management decisions. A negative result may occur with  improper specimen collection/handling, submission of specimen other than nasopharyngeal swab, presence of viral mutation(s) within the areas targeted by this assay, and inadequate number of viral copies(<138 copies/mL). A negative result must be combined with clinical observations, patient history, and  epidemiological information. The expected result is Negative.  Fact Sheet for Patients:  EntrepreneurPulse.com.au  Fact Sheet for Healthcare Providers:  IncredibleEmployment.be  This test is no t yet approved or cleared by the [place] and  has been authorized for detection and/or diagnosis of SARS-CoV-2 by FDA under an Emergency Use Authorization (EUA). This EUA will remain  in effect (meaning this test can be used) for the duration of the COVID-19 declaration under Section 564(b)(1) of the Act, 21 U.S.C.section 360bbb-3(b)(1), unless the authorization is terminated  or revoked sooner.       Influenza A by PCR NEGATIVE NEGATIVE Final   Influenza B by PCR NEGATIVE NEGATIVE Final    Comment: (NOTE) The Xpert Xpress SARS-CoV-2/FLU/RSV plus assay is intended as an aid in the diagnosis of influenza from Nasopharyngeal swab specimens and should not be used as a sole basis for treatment. Nasal washings and aspirates are unacceptable for Xpert Xpress SARS-CoV-2/FLU/RSV testing.  Fact Sheet for Patients: EntrepreneurPulse.com.au  Fact Sheet for Healthcare Providers: IncredibleEmployment.be  This test is not yet approved or cleared by the [place] FDA and has been authorized for detection and/or diagnosis of SARS-CoV-2 by FDA under an Emergency Use Authorization (EUA). This EUA will remain in effect (meaning this test can be used) for the duration of the COVID-19 declaration under Section 564(b)(1) of the Act, 21 U.S.C. section 360bbb-3(b)(1), unless the authorization is terminated or revoked.  Performed at [facility], [place] [address], [place], [place] [address]   MRSA PCR Screening     Status: None   Collection Time: 04/16/20  8:58 PM   Specimen: Nasopharyngeal  Result Value Ref Range Status   MRSA by PCR NEGATIVE NEGATIVE Final    Comment:        The GeneXpert MRSA Assay  (FDA approved for NASAL specimens only), is one component of a comprehensive MRSA colonization surveillance program. It is not intended to diagnose MRSA infection nor to guide or monitor treatment for MRSA infections. Performed at [facility], [place] [address], [place], [place] [address]   Culture, blood (routine x 2)     Status: None (Preliminary result)   Collection Time: 04/17/20  4:53 AM   Specimen: BLOOD LEFT HAND  Result Value Ref Range Status   Specimen Description BLOOD LEFT HAND  Final   Special Requests   Final    BOTTLES DRAWN AEROBIC AND ANAEROBIC Blood Culture adequate volume   Culture   Final    NO GROWTH 1 DAY Performed at [facility], [place] [address] [place], [place] [address]    Report Status PENDING  Incomplete  Culture, blood (routine x 2)     Status: None (Preliminary result)   Collection Time: 04/17/20  4:57 AM   Specimen: BLOOD  Result Value Ref Range Status   Specimen Description BLOOD LEFT ANTECUBITAL  Final   Special Requests   Final    BOTTLES DRAWN AEROBIC AND ANAEROBIC Blood Culture adequate volume   Culture   Final    NO GROWTH 1 DAY Performed at [facility], [place] [address] [place], [place] [address]    Report Status PENDING  Incomplete     Terri Piedra, NP [facility]  04/18/2020  11:09 AM

## 2020-04-18 NOTE — Progress Notes (Signed)
IP rehab admissions - I met with the patient at the bedside.  Patient is homeless per his description.  He has no DC plan.  Please see case manager note on 4/7.  Patient will likely need SNF placement.  Call me for questions.  [phone]

## 2020-04-18 NOTE — Consult Note (Addendum)
[facility] Consult   Reason for Consult: Medication management for Bipolar disorder Referring Physician: Ina Homes Patient Identification: Andrew Chaney MRN:  [id number] Principal Diagnosis: <principal problem not specified> Diagnosis:  Active Problems:   Hyponatremia   Chronic hepatitis C ([place])   Bipolar disorder, most recent episode manic ([place])   Subacute osteomyelitis, left ankle and foot ([place])   Septic shock ([place])   Cutaneous abscess of left foot   Total Time spent with patient: 45 minutes  Subjective:   HPI: Andrew Chaney is a 65 y.o. male patient with past history of bipolar, homelessness, seizure disorder, chronic hep C admitted for foot pain diagnosed with osteomyelitis and had below-knee amputation.  Psych consult was placed for longstanding history of bipolar and medication management.  Patient was started on lithium 300 mg twice daily yesterday.  Patient seen and examined.  Chart reviewed. Patient states he has history of bipolar but he has not been taking any medication for bipolar.  Patient states he is homeless and cannot keep his medication with him.  Patient states he has been feeling depression for a long time.  He rates his mood at 0 on a scale of 0-10 (0 being worst mood and 10 being best mood.  Patient endorses depressed mood, hopelessness, helplessness, worthlessness, poor appetite, poor sleep, decreased concentration and poor memory.  Patient states he wakes up in the middle of the night in cannot fall asleep.  Patient denies any racing thoughts, irritability, flight of ideas, or getting angry.  Patient denies active SI, but endorses passive SI states he feels sometimes that he better be dead.  Patient states he had SI in the past in 1991 but denies any history of suicidal attempt.  Patient denies HI, auditory visual hallucinations.  Patient denies paranoia.  Patient states he is interested in starting medication for his mood and will be compliant this  time.  Patient was also started on melatonin 3 mg nightly PRN for insomnia during this admission. Pt smokes marijuana everyday, and drinks alcohol (2 beers 24 oz) 3 times /week. Denies use of any other drugs. On examination, patient is lying down on the bed, disheveled, mildly somnolent but easily arousable.  Patient is cooperative, oriented x4. Patient mood is depressed, and affect is depressed.  Patient's speech is normal rate with normal volume.  Denies SI, HI, AVH,  endorses passive SI.  He is not responding to internal stimuli.  Does not appear manic or psychotic.  Chart review-patient was admitted to [facility] from 04/10/2020- 04/13/2018 with manic symptoms and was discharged on risperidone 4 mg at bedtime, benztropine 0.5 mg, Temazepam 30 mg at bedtime.  Patient states he did not take medication after discharge. Patient was seen by nurse practitioner on 03/23/2020.  Patient answered questions appropriately and was given prescription for hydroxyzine nightly for insomnia.  Past Psychiatric History: Bipolar 1 disorder  Risk to Self:  No Risk to Others:  No Prior Inpatient Therapy:  Yes during inpatient admission in 2020. Prior Outpatient Therapy:  No  Past Medical History:  Past Medical History:  Diagnosis Date  . Bipolar 1 disorder ([place])   . 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 Renal disorder     Past Surgical History:  Procedure Laterality Date  . AMPUTATION Left 04/16/2020   Procedure: AMPUTATION BELOW KNEE;  Surgeon: Newt Minion, MD;  Location: [facility];  Service: Orthopedics;  Laterality: Left;  . TEE WITHOUT CARDIOVERSION N/A 08/03/2019   Procedure: TRANSESOPHAGEAL  ECHOCARDIOGRAM (TEE);  Surgeon: Pixie Casino, MD;  [facility] ENDOSCOPY;  Service: Cardiovascular;  Laterality: N/A;   Family History:  Family History  Problem Relation Age of Onset  . Dementia Mother    Family Psychiatric  History: father- Bipolar- Did good on Lithium Social History:  Social History    Substance and Sexual Activity  Alcohol Use Yes     Social History   Substance and Sexual Activity  Drug Use Yes  . Types: Marijuana   Comment: States no longer uses marijuana because he is "on probation"    Social History   Socioeconomic History  . Marital status: Single    Spouse name: Not on file  . Number of children: Not on file  . Years of education: Not on file  . Highest education level: Not on file  Occupational History  . Not on file  Tobacco Use  . Smoking status: Current Every Day Smoker    Packs/day: 0.50    Types: Cigarettes  . Smokeless tobacco: Never Used  Vaping Use  . Vaping Use: Never used  Substance and Sexual Activity  . Alcohol use: Yes  . Drug use: Yes    Types: Marijuana    Comment: States no longer uses marijuana because he is "on probation"  . Sexual activity: Not Currently  Other Topics Concern  . Not on file  Social History Narrative  . Not on file   Social Determinants of Health   Financial Resource Strain: Not on file  Food Insecurity: Not on file  Transportation Needs: Not on file  Physical Activity: Not on file  Stress: Not on file  Social Connections: Not on file   Additional Social History:    Allergies:  No Known Allergies  Labs:  Results for orders placed or performed during the hospital encounter of 04/15/20 (from the past 48 hour(s))  Surgical pathology     Status: None   Collection Time: 04/16/20  4:55 PM  Result Value Ref Range   SURGICAL PATHOLOGY      SURGICAL PATHOLOGY CASE: [id number] PATIENT: Andrew Chaney Surgical Pathology Report     Clinical History: left foot osteomyelitis (cm)   FINAL MICROSCOPIC DIAGNOSIS:  A. LEG, LEFT BELOW KNEE, AMPUTATION: -  Ulcerated skin and subcutaneous tissue with necrosis and abscess formation -  Bone with acute inflammation -  Benign vessels -  Grossly viable margins   GROSS DESCRIPTION:  Received fresh is a 36 cm left below the knee amputation with  1.5 cm of tibia old 2 cm of fibula exposed at the soft tissue margin.  The skin and soft tissue at the margin of resection grossly appears viable.  The foot is edematous and there is a 3 cm blister over the lateral malleolus and a 2 cm blister over the medial malleolus with surrounding erythematous discoloration.  There are subcutaneous purulent abscesses at each site.  There is minimal atherosclerosis of the posterior tibial artery, peroneal artery and anterior tibial artery.  Sections are submitted in 7 cassettes.  1 = section of skin with subcutaneous abscess 2 = decalcified section of calcaneus 3 = posterior tibial artery 4 = peroneal artery 5 = anterior tibial artery 6 = proximal fibula marrow 7 = proximal tibia marrow (GRP 04/17/2020)   Final Diagnosis performed by Thressa Sheller, MD.   Electronically signed 04/18/2020 Technical and / or Professional components performed at [employer]. [facility], [place] [address] [place], [place] [address].  Immunohistochemistry Technical component (if applicable)  was performed at [facility] [address] [place], [place], [place] [address].   IMMUNOHISTOCHEMISTRY DISCLAIMER (if applicable): Some of these immunohistochemical stains may have been developed and the performance characteristics determine by [facility]. Some may not have been cleared or approved by the U.S. Food and Drug Administration. The FDA has determined that such clearance or approval is not necessary. This test is used for clinical  purposes. It should not be regarded as investigational or for research. This laboratory is certified under the [place] (CLIA-88) as qualified to perform high complexity clinical laboratory testing.  The controls stained appropriately.   I-STAT 7, (LYTES, BLD GAS, ICA, H+H)     Status: Abnormal   Collection Time: 04/16/20  5:31 PM  Result Value Ref Range   pH,  Arterial 7.364 7.350 - 7.450   pCO2 arterial 37.1 32.0 - 48.0 mmHg   pO2, Arterial 192 (H) 83.0 - 108.0 mmHg   Bicarbonate 21.5 20.0 - 28.0 mmol/L   TCO2 23 22 - 32 mmol/L   O2 Saturation 100.0 %   Acid-base deficit 4.0 (H) 0.0 - 2.0 mmol/L   Sodium 135 135 - 145 mmol/L   Potassium 3.7 3.5 - 5.1 mmol/L   Calcium, Ion 1.20 1.15 - 1.40 mmol/L   HCT 29.0 (L) 39.0 - 52.0 %   Hemoglobin 9.9 (L) 13.0 - 17.0 g/dL   Patient temperature 35.5 C    Sample type ARTERIAL   Type and screen [place]     Status: None   Collection Time: 04/16/20  5:33 PM  Result Value Ref Range   ABO/RH(D) A NEG    Antibody Screen NEG    Sample Expiration      04/19/2020,2359 Performed at [facility], [place] [address] [place], [place] [address]   ABO/Rh     Status: None   Collection Time: 04/16/20  5:35 PM  Result Value Ref Range   ABO/RH(D)      A NEG Performed at [facility] [address] [place], [place] [address]   Glucose, capillary     Status: Abnormal   Collection Time: 04/16/20  6:52 PM  Result Value Ref Range   Glucose-Capillary 63 (L) 70 - 99 mg/dL    Comment: Glucose reference range applies only to samples taken after fasting for at least 8 hours.  Glucose, capillary     Status: None   Collection Time: 04/16/20  6:54 PM  Result Value Ref Range   Glucose-Capillary 98 70 - 99 mg/dL    Comment: Glucose reference range applies only to samples taken after fasting for at least 8 hours.  MRSA PCR Screening     Status: None   Collection Time: 04/16/20  8:58 PM   Specimen: Nasopharyngeal  Result Value Ref Range   MRSA by PCR NEGATIVE NEGATIVE    Comment:        The GeneXpert MRSA Assay (FDA approved for NASAL specimens only), is one component of a comprehensive MRSA colonization surveillance program. It is not intended to diagnose MRSA infection nor to guide or monitor treatment for MRSA infections. Performed at [facility], [place] [address],  [place] [address]   CBC     Status: Abnormal   Collection Time: 04/17/20  4:07 AM  Result Value Ref Range   WBC 11.8 (H) 4.0 - 10.5 K/uL   RBC 3.22 (L) 4.22 - 5.81 MIL/uL  Hemoglobin 10.5 (L) 13.0 - 17.0 g/dL   HCT 30.9 (L) 39.0 - 52.0 %   MCV 96.0 80.0 - 100.0 fL   MCH 32.6 26.0 - 34.0 pg   MCHC 34.0 30.0 - 36.0 g/dL   RDW 13.2 11.5 - 15.5 %   Platelets 292 150 - 400 K/uL   nRBC 0.0 0.0 - 0.2 %    Comment: Performed at [facility] [address] [place], [place] [address]  Basic metabolic panel     Status: Abnormal   Collection Time: 04/17/20  4:07 AM  Result Value Ref Range   Sodium 136 135 - 145 mmol/L   Potassium 4.2 3.5 - 5.1 mmol/L   Chloride 110 98 - 111 mmol/L   CO2 21 (L) 22 - 32 mmol/L   Glucose, Bld 158 (H) 70 - 99 mg/dL    Comment: Glucose reference range applies only to samples taken after fasting for at least 8 hours.   BUN 16 8 - 23 mg/dL   Creatinine, Ser 0.64 0.61 - 1.24 mg/dL   Calcium 7.7 (L) 8.9 - 10.3 mg/dL   GFR, Estimated >60 >60 mL/min    Comment: (NOTE) Calculated using the CKD-EPI Creatinine Equation (2021)    Anion gap 5 5 - 15    Comment: Performed at [facility] [address], [place], [place] [address]  Lactic acid, plasma     Status: None   Collection Time: 04/17/20  4:07 AM  Result Value Ref Range   Lactic Acid, Venous 1.1 0.5 - 1.9 mmol/L    Comment: Performed at [facility] [address], [place], [place] [address]  Procalcitonin     Status: None   Collection Time: 04/17/20  4:07 AM  Result Value Ref Range   Procalcitonin 0.97 ng/mL    Comment:        Interpretation: PCT > 0.5 ng/mL and <= 2 ng/mL: Systemic infection (sepsis) is possible, but other conditions are known to elevate PCT as well. (NOTE)       Sepsis PCT Algorithm           Lower Respiratory Tract                                      Infection PCT Algorithm    ----------------------------     ----------------------------         PCT <  0.25 ng/mL                PCT < 0.10 ng/mL          Strongly encourage             Strongly discourage   discontinuation of antibiotics    initiation of antibiotics    ----------------------------     -----------------------------       PCT 0.25 - 0.50 ng/mL            PCT 0.10 - 0.25 ng/mL               OR       >80% decrease in PCT            Discourage initiation of  antibiotics      Encourage discontinuation           of antibiotics    ----------------------------     -----------------------------         PCT >= 0.50 ng/mL              PCT 0.26 - 0.50 ng/mL                AND       <80% decrease in PCT             Encourage initiation of                                             antibiotics       Encourage continuation           of antibiotics    ----------------------------     -----------------------------        PCT >= 0.50 ng/mL                  PCT > 0.50 ng/mL               AND         increase in PCT                  Strongly encourage                                      initiation of antibiotics    Strongly encourage escalation           of antibiotics                                     -----------------------------                                           PCT <= 0.25 ng/mL                                                 OR                                        > 80% decrease in PCT                                      Discontinue / Do not initiate                                             antibiotics  Performed at [facility], [place] [address], [place], [place] [address]   Culture, blood (routine x 2)     Status: None (Preliminary  result)   Collection Time: 04/17/20  4:53 AM   Specimen: BLOOD LEFT HAND  Result Value Ref Range   Specimen Description BLOOD LEFT HAND    Special Requests      BOTTLES DRAWN AEROBIC AND ANAEROBIC Blood Culture adequate volume   Culture      NO GROWTH 1 DAY Performed at [facility], [place] [address] [place] [address], [place], [place] [address]    Report Status PENDING   Culture, blood (routine x 2)     Status: None (Preliminary result)   Collection Time: 04/17/20  4:57 AM   Specimen: BLOOD  Result Value Ref Range   Specimen Description BLOOD LEFT ANTECUBITAL    Special Requests      BOTTLES DRAWN AEROBIC AND ANAEROBIC Blood Culture adequate volume   Culture      NO GROWTH 1 DAY Performed at [facility], [place] [address], [place], [place] [address]    Report Status PENDING   Magnesium     Status: None   Collection Time: 04/17/20  8:29 AM  Result Value Ref Range   Magnesium 2.0 1.7 - 2.4 mg/dL    Comment: Performed at [facility] [address] [place], [place] [address]  Phosphorus     Status: None   Collection Time: 04/17/20  8:29 AM  Result Value Ref Range   Phosphorus 2.5 2.5 - 4.6 mg/dL    Comment: Performed at [facility] [address], [place], [place] [address]  Procalcitonin     Status: None   Collection Time: 04/18/20  1:00 AM  Result Value Ref Range   Procalcitonin 0.73 ng/mL    Comment:        Interpretation: PCT > 0.5 ng/mL and <= 2 ng/mL: Systemic infection (sepsis) is possible, but other conditions are known to elevate PCT as well. (NOTE)       Sepsis PCT Algorithm           Lower Respiratory Tract                                      Infection PCT Algorithm    ----------------------------     ----------------------------         PCT < 0.25 ng/mL                PCT < 0.10 ng/mL          Strongly encourage             Strongly discourage   discontinuation of antibiotics    initiation of antibiotics    ----------------------------     -----------------------------       PCT 0.25 - 0.50 ng/mL            PCT 0.10 - 0.25 ng/mL               OR       >80% decrease in PCT            Discourage initiation of                                            antibiotics      Encourage discontinuation           of antibiotics     ----------------------------     -----------------------------  PCT >= 0.50 ng/mL              PCT 0.26 - 0.50 ng/mL                AND       <80% decrease in PCT             Encourage initiation of                                             antibiotics       Encourage continuation           of antibiotics    ----------------------------     -----------------------------        PCT >= 0.50 ng/mL                  PCT > 0.50 ng/mL               AND         increase in PCT                  Strongly encourage                                      initiation of antibiotics    Strongly encourage escalation           of antibiotics                                     -----------------------------                                           PCT <= 0.25 ng/mL                                                 OR                                        > 80% decrease in PCT                                      Discontinue / Do not initiate                                             antibiotics  Performed at [facility], [address], [place], [place] [address]   Basic metabolic panel     Status: Abnormal   Collection Time: 04/18/20  1:00 AM  Result Value Ref Range   Sodium 137 135 - 145 mmol/L   Potassium 4.6 3.5 - 5.1 mmol/L   Chloride 110 98 - 111 mmol/L   CO2  23 22 - 32 mmol/L   Glucose, Bld 170 (H) 70 - 99 mg/dL    Comment: Glucose reference range applies only to samples taken after fasting for at least 8 hours.   BUN 21 8 - 23 mg/dL   Creatinine, Ser 0.79 0.61 - 1.24 mg/dL   Calcium 8.0 (L) 8.9 - 10.3 mg/dL   GFR, Estimated >60 >60 mL/min    Comment: (NOTE) Calculated using the CKD-EPI Creatinine Equation (2021)    Anion gap 4 (L) 5 - 15    Comment: Performed at [facility] [address] [place], [place] [address]  CBC     Status: Abnormal   Collection Time: 04/18/20  1:00 AM  Result Value Ref Range   WBC 16.2 (H) 4.0 - 10.5 K/uL   RBC 3.19 (L) 4.22 -  5.81 MIL/uL   Hemoglobin 10.3 (L) 13.0 - 17.0 g/dL   HCT 30.9 (L) 39.0 - 52.0 %   MCV 96.9 80.0 - 100.0 fL   MCH 32.3 26.0 - 34.0 pg   MCHC 33.3 30.0 - 36.0 g/dL   RDW 13.1 11.5 - 15.5 %   Platelets 357 150 - 400 K/uL   nRBC 0.0 0.0 - 0.2 %    Comment: Performed at [facility], [place] [address] [place], [place] [address]    Current Facility-Administered Medications  Medication Dose Route Frequency Provider Last Rate Last Admin  . (feeding supplement) PROSource Plus liquid 30 mL  30 mL Oral BID BM Newt Minion, MD   30 mL at 04/17/20 1436  . 0.9 %  sodium chloride infusion  250 mL Intravenous Continuous Newt Minion, MD 10 mL/hr at 04/18/20 0600 Infusion Verify at 04/18/20 0600  . acetaminophen (TYLENOL) suppository 650 mg  650 mg Rectal Q6H PRN Newt Minion, MD      . acetaminophen (TYLENOL) tablet 325-650 mg  325-650 mg Oral Q6H PRN Newt Minion, MD      . albuterol (PROVENTIL) (2.5 MG/3ML) 0.083% nebulizer solution 2.5 mg  2.5 mg Nebulization Q2H PRN Newt Minion, MD      . alum & mag hydroxide-simeth (MAALOX/MYLANTA) 200-200-20 MG/5ML suspension 15-30 mL  15-30 mL Oral Q2H PRN Newt Minion, MD      . ascorbic acid (VITAMIN C) tablet 1,000 mg  1,000 mg Oral Daily Newt Minion, MD   1,000 mg at 04/17/20 1002  . bisacodyl (DULCOLAX) EC tablet 5 mg  5 mg Oral Daily PRN Newt Minion, MD      . Chlorhexidine Gluconate Cloth 2 % PADS 6 each  6 each Topical Daily [name], MD   6 each at 04/18/20 0237  . cyanocobalamin ((VITAMIN B-12)) injection 1,000 mcg  1,000 mcg Intramuscular Daily Newt Minion, MD   1,000 mcg at 04/17/20 1014   Followed by  . [START ON 04/19/2020] cyanocobalamin ((VITAMIN B-12)) injection 1,000 mcg  1,000 mcg Intramuscular Weekly Newt Minion, MD      . docusate sodium (COLACE) capsule 100 mg  100 mg Oral Daily Newt Minion, MD   100 mg at 04/17/20 1001  . enoxaparin (LOVENOX) injection 40 mg  40 mg Subcutaneous Q24H Candee Furbish, MD    40 mg at 04/17/20 1437  . feeding supplement (ENSURE ENLIVE / ENSURE PLUS) liquid 237 mL  237 mL Oral TID BM Candee Furbish, MD   237 mL at 04/17/20 1932  . folic acid (FOLVITE) tablet 1  mg  1 mg Oral Daily Newt Minion, MD   1 mg at 04/17/20 1002  . guaiFENesin-dextromethorphan (ROBITUSSIN DM) 100-10 MG/5ML syrup 15 mL  15 mL Oral Q4H PRN Newt Minion, MD      . hydrALAZINE (APRESOLINE) injection 5 mg  5 mg Intravenous Q20 Min PRN Newt Minion, MD      . HYDROcodone-acetaminophen (NORCO/VICODIN) 5-325 MG per tablet 1-2 tablet  1-2 tablet Oral Q4H PRN Newt Minion, MD   2 tablet at 04/16/20 1959  . HYDROmorphone (DILAUDID) injection 0.5-2 mg  0.5-2 mg Intravenous Q4H PRN Candee Furbish, MD   1 mg at 04/18/20 0157  . hydrOXYzine (ATARAX/VISTARIL) tablet 25 mg  25 mg Oral TID PRN Candee Furbish, MD      . labetalol (NORMODYNE) injection 10 mg  10 mg Intravenous Q10 min PRN Newt Minion, MD      . levETIRAcetam (KEPPRA) tablet 500 mg  500 mg Oral BID Candee Furbish, MD   500 mg at 04/17/20 2112  . lithium carbonate capsule 300 mg  300 mg Oral BID WC Candee Furbish, MD   300 mg at 04/18/20 6761  . LORazepam (ATIVAN) tablet 1-4 mg  1-4 mg Oral Q1H PRN Candee Furbish, MD   1 mg at 04/18/20 0107   Or  . LORazepam (ATIVAN) injection 1-4 mg  1-4 mg Intravenous Q1H PRN Candee Furbish, MD      . magnesium citrate solution 1 Bottle  1 Bottle Oral Once PRN Newt Minion, MD      . magnesium sulfate IVPB 2 g 50 mL  2 g Intravenous Daily PRN Newt Minion, MD      . melatonin tablet 3 mg  3 mg Oral QHS PRN Anders Simmonds, MD   3 mg at 04/17/20 2112  . metoprolol tartrate (LOPRESSOR) injection 2-5 mg  2-5 mg Intravenous Q2H PRN Newt Minion, MD      . multivitamin with minerals tablet 1 tablet  1 tablet Oral Daily Newt Minion, MD   1 tablet at 04/17/20 1000  . ondansetron (ZOFRAN) injection 4 mg  4 mg Intravenous Q6H PRN Newt Minion, MD      . oxyCODONE (Oxy IR/ROXICODONE) immediate  release tablet 10-15 mg  10-15 mg Oral Q4H PRN Newt Minion, MD   15 mg at 04/18/20 0107  . oxyCODONE (Oxy IR/ROXICODONE) immediate release tablet 5-10 mg  5-10 mg Oral Q4H PRN Newt Minion, MD      . pantoprazole (PROTONIX) EC tablet 40 mg  40 mg Oral Daily Newt Minion, MD   40 mg at 04/17/20 1001  . phenol (CHLORASEPTIC) mouth spray 1 spray  1 spray Mouth/Throat PRN Newt Minion, MD      . senna [facility]) tablet 8.6 mg  1 tablet Oral BID Newt Minion, MD   8.6 mg at 04/17/20 1001  . sodium chloride flush (NS) 0.9 % injection 10-40 mL  10-40 mL Intracatheter Q12H [name], MD   10 mL at 04/17/20 2115  . sodium chloride flush (NS) 0.9 % injection 10-40 mL  10-40 mL Intracatheter PRN [name], MD      . sodium hypochlorite (DAKIN'S 1/4 STRENGTH) topical solution   Irrigation BID Newt Minion, MD   Given at 04/18/20 [phone]  . thiamine (B-1) injection 100 mg  100 mg Intravenous Daily Newt Minion, MD   100 mg  at 04/17/20 1002  . vancomycin (VANCOREADY) IVPB 1000 mg/200 mL  1,000 mg Intravenous Q12H Newt Minion, MD 200 mL/hr at 04/18/20 0637 1,000 mg at 04/18/20 0637  . zinc sulfate capsule 220 mg  220 mg Oral Daily Newt Minion, MD   220 mg at 04/17/20 1001    Musculoskeletal: Strength & Muscle Tone: within normal limits Gait & Station: Not able to test Patient leans: N/A            Psychiatric Specialty Exam:  Presentation  General Appearance: Disheveled  Eye Contact:Minimal  Speech:Clear and Coherent  Speech Volume:Normal  Handedness:No data recorded  Mood and Affect  Mood:Anxious; Hopeless; Depressed  Affect:Depressed   Thought Process  Thought Processes:Coherent  Descriptions of Associations:Intact  Orientation:Full (Time, Place and Person)  Thought Content:Logical  History of Schizophrenia/Schizoaffective disorder:No  Duration of Psychotic Symptoms:No data recorded Hallucinations:Hallucinations: None  Ideas of  Reference:None  Suicidal Thoughts:Suicidal Thoughts: Yes, Passive SI Passive Intent and/or Plan: Without Intent  Homicidal Thoughts:Homicidal Thoughts: No   Sensorium  Memory:Immediate Fair  Judgment:No data recorded Insight:No data recorded  Executive Functions  Concentration:Fair  Attention Span:Poor  [place]   Psychomotor Activity  Psychomotor Activity:Psychomotor Activity: Normal   Assets  Assets:Desire for Improvement; Resilience   Sleep  Sleep:Sleep: Poor   Physical Exam: Physical Exam Vitals reviewed.  Constitutional:      General: He is in acute distress.     Appearance: Normal appearance. He is ill-appearing. He is not toxic-appearing or diaphoretic.  HENT:     Head: Normocephalic and atraumatic.  Pulmonary:     Effort: Pulmonary effort is normal.  Neurological:     General: No focal deficit present.     Mental Status: He is alert and oriented to person, place, and time.    Review of Systems  Constitutional: Negative for fever.  HENT: Negative for hearing loss.   Eyes: Negative for blurred vision.  Respiratory: Negative for cough.   Cardiovascular: Negative for chest pain.  Gastrointestinal: Negative for nausea and vomiting.  Genitourinary: Negative for dysuria.  Skin: Negative for rash.  Neurological: Negative for dizziness and headaches.  Psychiatric/Behavioral: Positive for depression, memory loss, substance abuse and suicidal ideas. Negative for hallucinations. The patient is nervous/anxious and has insomnia.   Passive SI Blood pressure 98/64, pulse 95, temperature (!) 97.5 F (36.4 C), temperature source Oral, resp. rate 19, height 6' (1.829 m), weight 71 kg, SpO2 93 %. Body mass index is 21.23 kg/m.  Treatment Plan Summary: Case discussed with Dr. Dwyane Dee. -Monitor for SI/HI/AVH -Continue lithium 300 mg twice daily -Send lithium level in the morning-ordered. -Increase melatonin to 10 mg  nightly PRN for insomnia.- Ordered -Start hydroxyzine 50 mg QHS for insomnia.- Ordered  Disposition: F/U at [facility] for medication management. Please come to [facility] (this facility) during walk in hours for appointment with psychiatrist for further medication management and for therapy.   Walk in hours are 8-11 AM Monday through Thursday for medication management.It is first come, first -serve; it is best to arrive by 7:00 AM. On Friday from 1 pm to 4 pm for therapy intake only. Please arrive by 12:00 pm as it is  first come, first -serve.   When you arrive please go upstairs for your appointment. If you are unsure of where to go, inform the front desk that you are here for a walk in appointment and they will assist you with directions upstairs.  Address:  [address] in [place], [place] Ph: [phone]    Armando Reichert, MD 04/18/2020 9:59 AM

## 2020-04-18 NOTE — Progress Notes (Addendum)
Physical Therapy Treatment Patient Details Name: Andrew Chaney MRN: [id number] DOB: 1955-11-15 Today's Date: 04/18/2020    History of Present Illness 65 y.o. male with medical history significant of bipolar disorder homelessness recent calcaneus fracture, history of MSSA bacteremia, cannabis abuse, seizure disorder, psychosis, chronic hepatitis C presented 04/15/20 with left foot pain with purulent drainage and rt lateral hip pressure ulcer. 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 Pt was able to stand and pivot with the RW to the recliner with min assist of 2 staff for safety.  Pt needing cues to use UEs to hop to the chair on the right LE.  Pt did well with max cues for safety.  Pt progressing.  Pt currently with functional limitations due to balance and endurance deficits.  Pt will benefit from skilled PT to increase their independence and safety with mobility to allow discharge to the venue listed below.   Supine BP: 102/71 Sitting EOB:  104/74 In chair: 107/76 O2 >91% on RA  Follow Up Recommendations  CIR     Equipment Recommendations  Wheelchair (measurements PT);Wheelchair cushion (measurements PT)    Recommendations for Other Services Rehab consult     Precautions / Restrictions Precautions Precautions: Fall, VAC Required Braces or Orthoses: Other Brace Other Brace: residual limb protector Restrictions Weight Bearing Restrictions: No LLE Weight Bearing: Non weight bearing    Mobility  Bed Mobility Overal bed mobility: Needs Assistance Bed Mobility: Supine to Sit     Supine to sit: Min guard     General bed mobility comments: with rail; incr time due to pain and anxiety; close guarding for safety, but no LOB    Transfers Overall transfer level: Needs assistance Equipment used: Rolling walker (2 wheeled) Transfers: Sit to/from [facility] Sit to Stand: Min guard;Min assist;+2 safety/equipment Stand  pivot transfers: Min assist;+2 safety/equipment       General transfer comment: Pt needed min assist to power up and cues for hand placement. Pt needed min assist to steady and cue for hopping to chair with pt able to push well on arms for a few hops to the chair.  Ambulation/Gait Ambulation/Gait assistance: Min assist;+2 safety/equipment Gait Distance (Feet): 3 Feet Assistive device: Rolling walker (2 wheeled) Gait Pattern/deviations: Step-to pattern   Gait velocity interpretation: <1.31 ft/sec, indicative of household ambulator General Gait Details: Pt was able to hop to the recliner with use of RW.   Stairs             Wheelchair Mobility    Modified Rankin (Stroke Patients Only)       Balance Overall balance assessment: Needs assistance Sitting-balance support: Feet supported;No upper extremity supported Sitting balance-Leahy Scale: Fair Sitting balance - Comments: able to sit with min guard assist once scooted to EOB   Standing balance support: Bilateral upper extremity supported;During functional activity Standing balance-Leahy Scale: Poor Standing balance comment: relies on UE support as well as external support                            Cognition Arousal/Alertness: Awake/alert Behavior During Therapy: Anxious Overall Cognitive Status: No family/caregiver present to determine baseline cognitive functioning                                 General Comments: highly anxious and does best with low tone of  voice, low volume and explaining everything that is attached to him and what you plan to do      Exercises General Exercises - Lower Extremity Quad Sets: AROM;Both;10 reps;Supine Straight Leg Raises: Left;AROM;5 reps;Supine    General Comments General comments (skin integrity, edema, etc.): Pt less anxious today and able to use RW with cues.  Still had some anxiety but was able to coach pt well.      Pertinent Vitals/Pain Pain  Assessment: Faces Faces Pain Scale: Hurts even more Pain Location: L BKA Pain Descriptors / Indicators: Guarding;Operative site guarding Pain Intervention(s): Limited activity within patient's tolerance;Monitored during session;Repositioned    Home Living                      Prior Function            PT Goals (current goals can now be found in the care plan section) Acute Rehab PT Goals Patient Stated Goal: To go to sleep Progress towards PT goals: Progressing toward goals    Frequency    Min 4X/week      PT Plan Current plan remains appropriate    Co-evaluation              AM-PAC PT "6 Clicks" Mobility   Outcome Measure  Help needed turning from your back to your side while in a flat bed without using bedrails?: None Help needed moving from lying on your back to sitting on the side of a flat bed without using bedrails?: A Little Help needed moving to and from a bed to a chair (including a wheelchair)?: Total Help needed standing up from a chair using your arms (e.g., wheelchair or bedside chair)?: Total Help needed to walk in hospital room?: Total Help needed climbing 3-5 steps with a railing? : Total 6 Click Score: 11    End of Session Equipment Utilized During Treatment: Other (comment) (residual limb protector) Activity Tolerance: Patient tolerated treatment well Patient left: with call bell/phone within reach;with chair alarm set;in chair Nurse Communication: Mobility status PT Visit Diagnosis: Difficulty in walking, not elsewhere classified (R26.2);Pain Pain - Right/Left: Left Pain - part of body: Leg     Time: 6237-6283 PT Time Calculation (min) (ACUTE ONLY): 25 min  Charges:  $Gait Training: 8-22 mins                     Andrew Chaney M,PT Acute Rehab Services [phone] (pager)   Andrew Chaney 04/18/2020, 4:21 PM

## 2020-04-18 NOTE — Progress Notes (Signed)
PROGRESS NOTE    Andrew Chaney  UEA:[id number] DOB: 03/05/1955 DOA: 04/15/2020 PCP: Patient, No Pcp Per (Inactive)   Brief Narrative:  Andrew Chaney is a 65 y.o. male with medical history significant of bipolar disorder homelessness recent calcaneus fracture, history of MSSA bacteremia, cannabis abuse, seizure disorder, psychosis, chronic hepatitis C. patient was diagnosed with left calcaneal fracture several months ago and has had trouble ambulating since. Presented with left foot pain, found on the side of the road by [employer] reporting multiple body aches.  In the ED, he was found to have osteomyelitis of left foot.  He was initially admitted to hospitalist service.  Orthopedics was consulted.  Started on broad-spectrum antibiotics.  Resuscitated with fluid initially.  He continued to have hypotension so PCCM was consulted.  He was then diagnosed with septic shock and required pressors.  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  Transferred under [facility] on 04/30/2020.  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  Has wound VAC attached.  Managed by orthopedics.  Pain controlled.  Right hip pressure ulcer from slipping on concrete: Due to being homeless.  Wound care.  Severe protein calorie malnutrition.  Nutrition on board.  MRSA bacteremia: Blood culture from 04/15/2020 +.  Blood culture from 04/17/2020 - thus far.  ID on board.  Patient remains on vancomycin.  Transthoracic echo with normal ejection fraction and no evidence of vegetation.  Will defer to ID if they would like TEE.  Homelessness: Will be difficult displacement.  TOC on board.  Bipolar disorder: Patient noncompliant and not on any medications.   Psychiatry consulted.  History of seizure disorder: Continue Keppra.  History of alcohol abuse: CIWA under 10.  Continue CIWA protocol.  No signs of withdrawal currently.  DVT prophylaxis: enoxaparin (LOVENOX) injection 40 mg Start: 04/17/20 1400 SCD's Start: 04/16/20 1925 SCDs Start: 04/16/20 1249   Code Status: Full Code  Family Communication: None present at bedside.  Plan of care discussed with patient in length and he verbalized understanding and agreed with it.  Status is: Inpatient  Remains inpatient appropriate because:Inpatient level of care appropriate due to severity of illness   Dispo: The patient is from: Homeless              Anticipated d/c is to: CIR              Patient currently is not medically stable to d/c.   Difficult to place patient Yes        Estimated body mass index is 21.23 kg/m as calculated from the following:   Height as of this encounter: 6' (1.829 m).   Weight as of this encounter: 71 kg.  Pressure Injury 04/15/20 Hip Right Unstageable - Full thickness tissue loss in which the base of the injury is covered by slough (yellow, tan, gray, green or brown) and/or eschar (tan, brown or black) in the wound bed. 4x4 R hip  (Active)  04/15/20 1900  Location: Hip  Location Orientation: Right  Staging: Unstageable - Full thickness tissue loss in which the base of the injury is covered by slough (yellow, tan, gray, green or brown) and/or eschar (tan, brown or black) in the  wound bed.  Wound Description (Comments): 4x4 R hip   Present on Admission: Yes     Nutritional status:  Nutrition Problem: Increased nutrient needs Etiology: wound healing   Signs/Symptoms: estimated needs   Interventions: Ensure Enlive (each supplement provides 350kcal and 20 grams of protein),Prostat,MVI    Consultants:   Orthopedics  Procedures:   Left BKA  Antimicrobials:  Anti-infectives (From admission, onward)   Start     Dose/Rate Route Frequency  Ordered Stop   04/16/20 0630  vancomycin (VANCOREADY) IVPB 1000 mg/200 mL       "Followed by" Linked Group Details   1,000 mg 200 mL/hr over 60 Minutes Intravenous Every 12 hours 04/15/20 1824     04/16/20 0200  metroNIDAZOLE (FLAGYL) IVPB 500 mg  Status:  Discontinued        500 mg 100 mL/hr over 60 Minutes Intravenous Every 8 hours 04/15/20 1858 04/17/20 0945   04/15/20 1830  ceFEPIme (MAXIPIME) 2 g in sodium chloride 0.9 % 100 mL IVPB  Status:  Discontinued        2 g 200 mL/hr over 30 Minutes Intravenous Every 8 hours 04/15/20 1824 04/17/20 0849   04/15/20 1830  vancomycin (VANCOREADY) IVPB 1500 mg/300 mL       "Followed by" Linked Group Details   1,500 mg 150 mL/hr over 120 Minutes Intravenous  Once 04/15/20 1824 04/16/20 0003   04/15/20 1745  metroNIDAZOLE (FLAGYL) IVPB 500 mg        500 mg 100 mL/hr over 60 Minutes Intravenous  Once 04/15/20 1737 04/15/20 1914         Subjective: Patient seen and examined.  He has no complaints.  Objective: Vitals:   04/18/20 0700 04/18/20 0731 04/18/20 0800 04/18/20 0900  BP: (!) 83/71  96/67 98/64  Pulse: 90  87 95  Resp:   (!) 9 19  Temp:  (!) 97.5 F (36.4 C)    TempSrc:  Oral    SpO2:   91% 93%  Weight:      Height:        Intake/Output Summary (Last 24 hours) at 04/18/2020 1037 Last data filed at 04/18/2020 0800 Gross per 24 hour  Intake 2311.16 ml  Output 615 ml  Net 1696.16 ml   Filed Weights   04/15/20 1800  Weight: 71 kg    Examination:  General exam: Appears calm and comfortable, chronically sick and malnourished Respiratory system: Clear to auscultation. Respiratory effort normal. Cardiovascular system: S1 & S2 heard, RRR. No JVD, murmurs, rubs, gallops or clicks. No pedal edema. Gastrointestinal system: Abdomen is nondistended, soft and nontender. No organomegaly or masses felt. Normal bowel sounds heard. Central nervous system: Alert and oriented. No focal neurological deficits. Extremities: Left  BKA Psychiatry: Judgement and insight appear poor   Data Reviewed: I have personally reviewed following labs and imaging studies  CBC: Recent Labs  Lab 04/15/20 1151 04/16/20 0251 04/16/20 1731 04/17/20 0407 04/18/20 0100  WBC 15.2* 17.5*  --  11.8* 16.2*  NEUTROABS 12.7* 14.4*  --   --   --   HGB 11.3* 10.3* 9.9* 10.5* 10.3*  HCT 33.8* 31.1* 29.0* 30.9* 30.9*  MCV 97.1 97.5  --  96.0 96.9  PLT 261 274  --  292 676   Basic Metabolic Panel: Recent Labs  Lab 04/15/20 1151 04/16/20 0250 04/16/20 0251 04/16/20 1731 04/17/20 0407 04/17/20 0829 04/18/20 0100  NA 134*  --  132* 135 136  --  137  K 3.5  --  3.2* 3.7 4.2  --  4.6  CL 102  --  104  --  110  --  110  CO2 26  --  22  --  21*  --  23  GLUCOSE 110*  --  105*  --  158*  --  170*  BUN 13  --  16  --  16  --  21  CREATININE 0.84  --  0.87  --  0.64  --  0.79  CALCIUM 8.8*  --  7.8*  --  7.7*  --  8.0*  MG  --  1.4*  --   --   --  2.0  --   PHOS  --  1.9*  --   --   --  2.5  --    GFR: Estimated Creatinine Clearance: 96.1 mL/min (by C-G formula based on SCr of 0.79 mg/dL). Liver Function Tests: Recent Labs  Lab 04/16/20 0250 04/16/20 0251  AST 14* 14*  ALT 10 10  ALKPHOS 53 51  BILITOT 0.8 0.8  PROT 4.7* 4.5*  ALBUMIN 1.8* 1.8*   No results for input(s): LIPASE, AMYLASE in the last 168 hours. No results for input(s): AMMONIA in the last 168 hours. Coagulation Profile: Recent Labs  Lab 04/16/20 0250  INR 1.4*   Cardiac Enzymes: Recent Labs  Lab 04/16/20 0250  CKTOTAL 84   BNP (last 3 results) No results for input(s): PROBNP in the last 8760 hours. HbA1C: No results for input(s): HGBA1C in the last 72 hours. CBG: Recent Labs  Lab 04/16/20 1852 04/16/20 1854  GLUCAP 63* 98   Lipid Profile: No results for input(s): CHOL, HDL, LDLCALC, TRIG, CHOLHDL, LDLDIRECT in the last 72 hours. Thyroid Function Tests: Recent Labs    04/16/20 0251  TSH 1.329   Anemia Panel: Recent Labs     04/16/20 0251  VITAMINB12 52*  FOLATE 11.0  FERRITIN 242  TIBC 140*  IRON 13*  RETICCTPCT 0.5   Sepsis Labs: Recent Labs  Lab 04/15/20 1734 04/16/20 0250 04/16/20 0251 04/17/20 0407 04/18/20 0100  PROCALCITON  --  0.94  --  0.97 0.73  LATICACIDVEN 1.9  --  2.0* 1.1  --     Recent Results (from the past 240 hour(s))  Blood culture (routine x 2)     Status: Abnormal   Collection Time: 04/15/20  6:07 PM   Specimen: BLOOD  Result Value Ref Range Status   Specimen Description BLOOD BLOOD LEFT FOREARM  Final   Special Requests   Final    BOTTLES DRAWN AEROBIC AND ANAEROBIC Blood Culture results may not be optimal due to an inadequate volume of blood received in culture bottles   Culture  Setup Time   Final    GRAM POSITIVE COCCI IN BOTH AEROBIC AND ANAEROBIC BOTTLES CRITICAL VALUE NOTED.  VALUE IS CONSISTENT WITH PREVIOUSLY REPORTED AND CALLED VALUE.    Culture (A)  Final    STAPHYLOCOCCUS AUREUS SUSCEPTIBILITIES PERFORMED ON PREVIOUS CULTURE WITHIN THE LAST 5 DAYS. Performed at [facility], [place] [address] [place], [place] [address]    Report Status 04/18/2020 FINAL  Final  Blood culture (routine x 2)     Status: Abnormal   Collection Time: 04/15/20  6:09 PM   Specimen: BLOOD LEFT HAND  Result Value Ref Range Status   Specimen Description BLOOD LEFT HAND  Final   Special Requests   Final    BOTTLES DRAWN AEROBIC AND ANAEROBIC Blood Culture results may not be optimal  due to an inadequate volume of blood received in culture bottles   Culture  Setup Time   Final    GRAM POSITIVE COCCI IN BOTH AEROBIC AND ANAEROBIC BOTTLES CRITICAL RESULT CALLED TO, READ BACK BY AND VERIFIED WITH: PHARMD [place] AT 1219 ON 04/16/2020 BY T.SAAD Performed at [facility], [place] [address], [place], [place] [address]    Culture METHICILLIN RESISTANT STAPHYLOCOCCUS AUREUS (A)  Final   Report Status 04/18/2020 FINAL  Final   Organism ID, Bacteria METHICILLIN RESISTANT  STAPHYLOCOCCUS AUREUS  Final      Susceptibility   Methicillin resistant staphylococcus aureus - MIC*    CIPROFLOXACIN >=8 RESISTANT Resistant     ERYTHROMYCIN >=8 RESISTANT Resistant     GENTAMICIN <=0.5 SENSITIVE Sensitive     OXACILLIN >=4 RESISTANT Resistant     TETRACYCLINE <=1 SENSITIVE Sensitive     VANCOMYCIN <=0.5 SENSITIVE Sensitive     TRIMETH/SULFA >=320 RESISTANT Resistant     CLINDAMYCIN <=0.25 SENSITIVE Sensitive     RIFAMPIN <=0.5 SENSITIVE Sensitive     Inducible Clindamycin NEGATIVE Sensitive     * METHICILLIN RESISTANT STAPHYLOCOCCUS AUREUS  Blood Culture ID Panel (Reflexed)     Status: Abnormal   Collection Time: 04/15/20  6:09 PM  Result Value Ref Range Status   Enterococcus faecalis NOT DETECTED NOT DETECTED Final   Enterococcus Faecium NOT DETECTED NOT DETECTED Final   Listeria monocytogenes NOT DETECTED NOT DETECTED Final   Staphylococcus species DETECTED (A) NOT DETECTED Final    Comment: CRITICAL RESULT CALLED TO, READ BACK BY AND VERIFIED WITH: PHAMD S.GRUBER AT 1219 ON 04/16/2020 BY T.SAAD.    Staphylococcus aureus (BCID) DETECTED (A) NOT DETECTED Final    Comment: Methicillin (oxacillin)-resistant Staphylococcus aureus (MRSA). MRSA is predictably resistant to beta-lactam antibiotics (except ceftaroline). Preferred therapy is vancomycin unless clinically contraindicated. Patient requires contact precautions if  hospitalized. CRITICAL RESULT CALLED TO, READ BACK BY AND VERIFIED WITH: PHAMD S.GRUBER AT 1219 ON 04/16/2020 BY T.SAAD.    Staphylococcus epidermidis NOT DETECTED NOT DETECTED Final   Staphylococcus lugdunensis NOT DETECTED NOT DETECTED Final   Streptococcus species NOT DETECTED NOT DETECTED Final   Streptococcus agalactiae NOT DETECTED NOT DETECTED Final   Streptococcus pneumoniae NOT DETECTED NOT DETECTED Final   Streptococcus pyogenes NOT DETECTED NOT DETECTED Final   A.calcoaceticus-baumannii NOT DETECTED NOT DETECTED Final   Bacteroides  fragilis NOT DETECTED NOT DETECTED Final   Enterobacterales NOT DETECTED NOT DETECTED Final   Enterobacter cloacae complex NOT DETECTED NOT DETECTED Final   Escherichia coli NOT DETECTED NOT DETECTED Final   Klebsiella aerogenes NOT DETECTED NOT DETECTED Final   Klebsiella oxytoca NOT DETECTED NOT DETECTED Final   Klebsiella pneumoniae NOT DETECTED NOT DETECTED Final   Proteus species NOT DETECTED NOT DETECTED Final   Salmonella species NOT DETECTED NOT DETECTED Final   Serratia marcescens NOT DETECTED NOT DETECTED Final   Haemophilus influenzae NOT DETECTED NOT DETECTED Final   Neisseria meningitidis NOT DETECTED NOT DETECTED Final   Pseudomonas aeruginosa NOT DETECTED NOT DETECTED Final   Stenotrophomonas maltophilia NOT DETECTED NOT DETECTED Final   Candida albicans NOT DETECTED NOT DETECTED Final   Candida auris NOT DETECTED NOT DETECTED Final   Candida glabrata NOT DETECTED NOT DETECTED Final   Candida krusei NOT DETECTED NOT DETECTED Final   Candida parapsilosis NOT DETECTED NOT DETECTED Final   Candida tropicalis NOT DETECTED NOT DETECTED Final   Cryptococcus neoformans/gattii NOT DETECTED NOT DETECTED Final   Meth resistant mecA/C and  MREJ DETECTED (A) NOT DETECTED Final    Comment: CRITICAL RESULT CALLED TO, READ BACK BY AND VERIFIED WITH: PHAMD S.GRUBER AT 1219 ON 04/16/2020 BY T.SAAD. Performed at [facility], [place] [address], [place], [place] [address]   Resp Panel by RT-PCR (Flu A&B, Covid) Nasopharyngeal Swab     Status: None   Collection Time: 04/15/20  6:26 PM   Specimen: Nasopharyngeal Swab; Nasopharyngeal(NP) swabs in vial transport medium  Result Value Ref Range Status   SARS Coronavirus 2 by RT PCR NEGATIVE NEGATIVE Final    Comment: (NOTE) SARS-CoV-2 target nucleic acids are NOT DETECTED.  The SARS-CoV-2 RNA is generally detectable in upper respiratory specimens during the acute phase of infection. The lowest concentration of SARS-CoV-2 viral copies this  assay can detect is 138 copies/mL. A negative result does not preclude SARS-Cov-2 infection and should not be used as the sole basis for treatment or other patient management decisions. A negative result may occur with  improper specimen collection/handling, submission of specimen other than nasopharyngeal swab, presence of viral mutation(s) within the areas targeted by this assay, and inadequate number of viral copies(<138 copies/mL). A negative result must be combined with clinical observations, patient history, and epidemiological information. The expected result is Negative.  Fact Sheet for Patients:  EntrepreneurPulse.com.au  Fact Sheet for Healthcare Providers:  IncredibleEmployment.be  This test is no t yet approved or cleared by the [place] FDA and  has been authorized for detection and/or diagnosis of SARS-CoV-2 by FDA under an Emergency Use Authorization (EUA). This EUA will remain  in effect (meaning this test can be used) for the duration of the COVID-19 declaration under Section 564(b)(1) of the Act, 21 U.S.C.section 360bbb-3(b)(1), unless the authorization is terminated  or revoked sooner.       Influenza A by PCR NEGATIVE NEGATIVE Final   Influenza B by PCR NEGATIVE NEGATIVE Final    Comment: (NOTE) The Xpert Xpress SARS-CoV-2/FLU/RSV plus assay is intended as an aid in the diagnosis of influenza from Nasopharyngeal swab specimens and should not be used as a sole basis for treatment. Nasal washings and aspirates are unacceptable for Xpert Xpress SARS-CoV-2/FLU/RSV testing.  Fact Sheet for Patients: EntrepreneurPulse.com.au  Fact Sheet for Healthcare Providers: IncredibleEmployment.be  This test is not yet approved or cleared by the [place] FDA and has been authorized for detection and/or diagnosis of SARS-CoV-2 by FDA under an Emergency Use Authorization (EUA). This EUA will  remain in effect (meaning this test can be used) for the duration of the COVID-19 declaration under Section 564(b)(1) of the Act, 21 U.S.C. section 360bbb-3(b)(1), unless the authorization is terminated or revoked.  Performed at [facility], [place] [address], [place], [place] [address]   MRSA PCR Screening     Status: None   Collection Time: 04/16/20  8:58 PM   Specimen: Nasopharyngeal  Result Value Ref Range Status   MRSA by PCR NEGATIVE NEGATIVE Final    Comment:        The GeneXpert MRSA Assay (FDA approved for NASAL specimens only), is one component of a comprehensive MRSA colonization surveillance program. It is not intended to diagnose MRSA infection nor to guide or monitor treatment for MRSA infections. Performed at [facility], [place] [address] [place] [address] [place], [place] [address]   Culture, blood (routine x 2)     Status: None (Preliminary result)   Collection Time: 04/17/20  4:53 AM   Specimen: BLOOD LEFT HAND  Result Value Ref Range  Status   Specimen Description BLOOD LEFT HAND  Final   Special Requests   Final    BOTTLES DRAWN AEROBIC AND ANAEROBIC Blood Culture adequate volume   Culture   Final    NO GROWTH 1 DAY Performed at [facility], [address], [place], [place] [address]    Report Status PENDING  Incomplete  Culture, blood (routine x 2)     Status: None (Preliminary result)   Collection Time: 04/17/20  4:57 AM   Specimen: BLOOD  Result Value Ref Range Status   Specimen Description BLOOD LEFT ANTECUBITAL  Final   Special Requests   Final    BOTTLES DRAWN AEROBIC AND ANAEROBIC Blood Culture adequate volume   Culture   Final    NO GROWTH 1 DAY Performed at [facility], [place] [address] [place], [place] [address]    Report Status PENDING  Incomplete      Radiology Studies: DG Chest Port 1 View  Result Date: 04/16/2020 CLINICAL DATA:  Sepsis after obstetrical procedure. Poor venous access. EXAM: PORTABLE CHEST 1 VIEW COMPARISON:   04/15/2020 FINDINGS: Cardiac enlargement. No vascular congestion. Developing infiltration in the left lung base may represent atelectasis or developing pneumonia. Right lung is clear. Mediastinal contours appear intact. A left central venous catheter has been placed with tip projecting over the likely region of the brachiocephalic vein. No pneumothorax. 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 IMPRESSION: Cardiac enlargement. Developing infiltration or atelectasis in the left lung base. Left central venous catheter tip projects over the brachiocephalic vein. Electronically Signed   By: Lucienne Capers M.D.   On: 04/16/2020 21:17   ECHOCARDIOGRAM COMPLETE  Result Date: 04/17/2020    ECHOCARDIOGRAM REPORT   Patient Name:   Andrew Chaney Date of Exam: 04/17/2020 Medical Rec #:  [id number]      Height:       72.0 in Accession #:    [id number]     Weight:       156.5 lb Date of Birth:  June 20, 1955     BSA:          1.920 m Patient Age:    73 years       BP:           100/64 mmHg Patient Gender: M              HR:           81 bpm. Exam Location:  Inpatient Procedure: 2D Echo, 3D Echo, Cardiac Doppler and Color Doppler Indications:    Fever.  History:        Patient has prior history of Echocardiogram examinations, most                 recent 08/03/2019. Signs/Symptoms:Altered Mental Status and                 Bacteremia. Cancer.  Sonographer:    Roseanna Rainbow RDCS Referring Phys: [place]  1. Left ventricular ejection fraction, by estimation, is 60 to 65%. The left ventricle has normal function. The left ventricle has no regional wall motion abnormalities. There is mild left ventricular hypertrophy. Left ventricular diastolic parameters were normal.  2. Right ventricular systolic function is normal. The right ventricular size is normal. There is normal pulmonary artery systolic pressure.  3. Some thickening of the base of the anterior mitral leaflet  does not look like a vegetation . The mitral  valve is abnormal. Trivial mitral valve regurgitation. No evidence of mitral stenosis.  4. The aortic valve is normal in structure. Aortic valve regurgitation is not visualized. No aortic stenosis is present.  5. Aortic dilatation noted. There is mild dilatation of the ascending aorta, measuring 41 mm.  6. The inferior vena cava is normal in size with greater than 50% respiratory variability, suggesting right atrial pressure of 3 mmHg. FINDINGS  Left Ventricle: Left ventricular ejection fraction, by estimation, is 60 to 65%. The left ventricle has normal function. The left ventricle has no regional wall motion abnormalities. The left ventricular internal cavity size was normal in size. There is  mild left ventricular hypertrophy. Left ventricular diastolic parameters were normal. Right Ventricle: The right ventricular size is normal. No increase in right ventricular wall thickness. Right ventricular systolic function is normal. There is normal pulmonary artery systolic pressure. The tricuspid regurgitant velocity is 2.30 m/s, and  with an assumed right atrial pressure of 3 mmHg, the estimated right ventricular systolic pressure is 51.7 mmHg. Left Atrium: Left atrial size was normal in size. Right Atrium: Right atrial size was normal in size. Pericardium: There is no evidence of pericardial effusion. Mitral Valve: Some thickening of the base of the anterior mitral leaflet does not look like a vegetation. The mitral valve is abnormal. Trivial mitral valve regurgitation. No evidence of mitral valve stenosis. Tricuspid Valve: The tricuspid valve is normal in structure. Tricuspid valve regurgitation is mild . No evidence of tricuspid stenosis. Aortic Valve: The aortic valve is normal in structure. Aortic valve regurgitation is not visualized. No aortic stenosis is present. Pulmonic Valve: The pulmonic valve was normal in structure. Pulmonic valve regurgitation is not  visualized. No evidence of pulmonic stenosis. Aorta: The aortic root is normal in size and structure and aortic dilatation noted. There is mild dilatation of the ascending aorta, measuring 41 mm. Venous: The inferior vena cava is normal in size with greater than 50% respiratory variability, suggesting right atrial pressure of 3 mmHg. IAS/Shunts: No atrial level shunt detected by color flow Doppler.  LEFT VENTRICLE PLAX 2D LVIDd:         5.60 cm      Diastology LVIDs:         3.50 cm      LV e' medial:    8.95 cm/s LV PW:         1.40 cm      LV E/e' medial:  9.8 LV IVS:        1.20 cm      LV e' lateral:   10.30 cm/s LVOT diam:     2.30 cm      LV E/e' lateral: 8.5 LV SV:         101 LV SV Index:   53 LVOT Area:     4.15 cm  LV Volumes (MOD) LV vol d, MOD A2C: 149.0 ml LV vol d, MOD A4C: 118.0 ml LV vol s, MOD A2C: 56.0 ml LV vol s, MOD A4C: 54.3 ml LV SV MOD A2C:     93.0 ml LV SV MOD A4C:     118.0 ml LV SV MOD BP:      77.2 ml RIGHT VENTRICLE             IVC RV S prime:     14.70 cm/s  IVC diam: 1.70 cm TAPSE (M-mode): 2.0 cm LEFT ATRIUM             Index  RIGHT ATRIUM           Index LA diam:        3.75 cm 1.95 cm/m  RA Area:     18.40 cm LA Vol (A2C):   50.2 ml 26.15 ml/m RA Volume:   49.50 ml  25.78 ml/m LA Vol (A4C):   37.6 ml 19.59 ml/m LA Biplane Vol: 48.0 ml 25.00 ml/m  AORTIC VALVE LVOT Vmax:   115.00 cm/s LVOT Vmean:  78.800 cm/s LVOT VTI:    0.244 m  AORTA Ao Root diam: 4.50 cm Ao Asc diam:  4.10 cm MITRAL VALVE               TRICUSPID VALVE MV Area (PHT): 4.06 cm    TR Peak grad:   21.2 mmHg MV Decel Time: 187 msec    TR Vmax:        230.00 cm/s MV E velocity: 87.80 cm/s MV A velocity: 89.20 cm/s  SHUNTS MV E/A ratio:  0.98        Systemic VTI:  0.24 m                            Systemic Diam: 2.30 cm Jenkins Rouge MD Electronically signed by Jenkins Rouge MD Signature Date/Time: 04/17/2020/3:17:27 PM    Final     Scheduled Meds: . (feeding supplement) PROSource Plus  30 mL Oral BID BM   . vitamin C  1,000 mg Oral Daily  . Chlorhexidine Gluconate Cloth  6 each Topical Daily  . cyanocobalamin  1,000 mcg Intramuscular Daily   Followed by  . [START ON 04/19/2020] cyanocobalamin  1,000 mcg Intramuscular Weekly  . docusate sodium  100 mg Oral Daily  . enoxaparin (LOVENOX) injection  40 mg Subcutaneous Q24H  . feeding supplement  237 mL Oral TID BM  . folic acid  1 mg Oral Daily  . hydrOXYzine  50 mg Oral QHS  . levETIRAcetam  500 mg Oral BID  . lithium carbonate  300 mg Oral BID WC  . multivitamin with minerals  1 tablet Oral Daily  . pantoprazole  40 mg Oral Daily  . senna  1 tablet Oral BID  . sodium chloride flush  10-40 mL Intracatheter Q12H  . sodium hypochlorite   Irrigation BID  . thiamine injection  100 mg Intravenous Daily  . zinc sulfate  220 mg Oral Daily   Continuous Infusions: . sodium chloride 10 mL/hr at 04/18/20 0600  . magnesium sulfate bolus IVPB    . vancomycin 1,000 mg (04/18/20 9379)     LOS: 3 days   Time spent: 35 minutes   Darliss Cheney, MD [facility]  04/18/2020, 10:37 AM   To contact the attending provider between 7A-7P or the covering provider during after hours 7P-7A, please log into the web site [email or web address].

## 2020-04-18 NOTE — Progress Notes (Signed)
Occupational Therapy Treatment Patient Details Name: Andrew Chaney MRN: [id number] DOB: [birth year] Today's Date: 04/18/2020    History of present illness 65 y.o. male with medical history significant of bipolar disorder homelessness recent calcaneus fracture, history of MSSA bacteremia, cannabis abuse, seizure disorder, psychosis, chronic hepatitis C presented 04/15/20 with left foot pain with purulent drainage and rt lateral hip pressure ulcer. 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 Session focus on functional ADL transfer from EOB>recliner. Pt continues to present with increased anxiety, increased pain and decreased activity tolerance. Pt currently requires supervision for seated grooming tasks, total A for LB ADLS and MINA  +2 for functional transfers. Pt would continue to benefit from skilled occupational therapy while admitted and after d/c to address the below listed limitations in order to improve overall functional mobility and facilitate independence with BADL participation. DC plan remains appropriate, will follow acutely per POC.   pt on 3L upon arrival however doffed O2 during session with SpO2 >91%.  BP supine 102/71 BP EOB 104/74 BP chair 107/76   Follow Up Recommendations  CIR;Supervision/Assistance - 24 hour    Equipment Recommendations  3 in 1 bedside commode;Wheelchair (measurements OT)    Recommendations for Other Services      Precautions / Restrictions Precautions Precautions: Fall Precaution Comments: VAC Required Braces or Orthoses: Other Brace Other Brace: residual limb protector Restrictions Weight Bearing Restrictions: No LLE Weight Bearing: Non weight bearing       Mobility Bed Mobility Overal bed mobility: Needs Assistance Bed Mobility: Supine to Sit     Supine to sit: Min assist     General bed mobility comments: light MIN A to assist with elevating trunk into sitting     Transfers Overall transfer level: Needs assistance Equipment used: Rolling walker (2 wheeled) Transfers: Sit to/from [facility] Sit to Stand: Min guard;Min assist;+2 safety/equipment Stand pivot transfers: Min assist;+2 safety/equipment       General transfer comment: Pt needed min assist to power up and cues for hand placement. Pt needed min assist to steady and cue for hopping to chair with pt able to push well on arms for a few hops to the chair.    Balance Overall balance assessment: Needs assistance Sitting-balance support: Feet supported;No upper extremity supported Sitting balance-Leahy Scale: Fair Sitting balance - Comments: able to sit with min guard assist once scooted to EOB   Standing balance support: Bilateral upper extremity supported;During functional activity Standing balance-Leahy Scale: Poor Standing balance comment: relies on UE support as well as external support                           ADL either performed or assessed with clinical judgement   ADL Overall ADL's : Needs assistance/impaired     Grooming: Wash/dry face;Sitting;Supervision/safety;Set up Grooming Details (indicate cue type and reason): encouragement needed for pt to wash his own face             Lower Body Dressing: Total assistance;Bed level Lower Body Dressing Details (indicate cue type and reason): to adjust sock Toilet Transfer: +2 for safety/equipment;Stand-pivot;Minimal assistance;RW Toilet Transfer Details (indicate cue type and reason): simulated via functional mobility with pt able to take hops to recliner         Functional mobility during ADLs: Minimal assistance;+2 for safety/equipment General ADL Comments: pt continues to be limited by anxiety but able to progress OOB to  chair with MINA +2. pt present with decreased activity tolerance, generalized deconditioning and increased pain     Vision       Perception     Praxis       Cognition Arousal/Alertness: Awake/alert Behavior During Therapy: Anxious Overall Cognitive Status: No family/caregiver present to determine baseline cognitive functioning                                 General Comments: highly anxious and does best with low tone of voice, low volume and explaining everything that is attached to him and what you plan to do        Exercises Exercises: General Lower Extremity General Exercises - Lower Extremity Quad Sets: AROM;Both;10 reps;Supine Straight Leg Raises: Left;AROM;5 reps;Supine   Shoulder Instructions       General Comments pt on 3L upon arrival howeve doffed O2 during session with SpO2 >91%. BP supine 102/71, BP EOB 104/74, BP chair 107/76    Pertinent Vitals/ Pain       Pain Assessment: Faces Faces Pain Scale: Hurts even more Pain Location: L BKA Pain Descriptors / Indicators: Guarding;Operative site guarding Pain Intervention(s): Limited activity within patient's tolerance;Monitored during session;Repositioned  Home Living                                          Prior Functioning/Environment              Frequency  Min 2X/week        Progress Toward Goals  OT Goals(current goals can now be found in the care plan section)  Progress towards OT goals: Progressing toward goals  Acute Rehab OT Goals Patient Stated Goal: To go to sleep OT Goal Formulation: With patient Time For Goal Achievement: 05/01/20 Potential to Achieve Goals: Good  Plan Discharge plan remains appropriate;Frequency remains appropriate    Co-evaluation      Reason for Co-Treatment: For patient/therapist safety;To address functional/ADL transfers          AM-PAC OT "6 Clicks" Daily Activity     Outcome Measure   Help from another person eating meals?: A Little Help from another person taking care of personal grooming?: A Lot Help from another person toileting, which includes using toliet, bedpan, or  urinal?: A Lot Help from another person bathing (including washing, rinsing, drying)?: A Lot Help from another person to put on and taking off regular upper body clothing?: A Lot Help from another person to put on and taking off regular lower body clothing?: Total 6 Click Score: 12    End of Session Equipment Utilized During Treatment: Gait belt;Rolling walker;Other (comment) (limb protector)  OT Visit Diagnosis: Unsteadiness on feet (R26.81);Hemiplegia and hemiparesis;Pain Pain - Right/Left: Left Pain - part of body: Leg   Activity Tolerance Patient tolerated treatment well   Patient Left in chair;with call bell/phone within reach;with chair alarm set   Nurse Communication Mobility status        Time: 0102-7253 OT Time Calculation (min): 25 min  Charges: OT General Charges $OT Visit: 1 Visit OT Treatments $Self Care/Home Management : 8-22 mins  Harley Alto., COTA/L Acute Rehabilitation Services [phone]    [name] 04/18/2020, 4:32 PM

## 2020-04-18 NOTE — Progress Notes (Signed)
Patient is postop day 2 status post below-knee amputation he is doing well sitting up in bed with nursing staff at his side much more alert and awake today  Wound VAC is functioning 0 cc in the canister.  Examined right hip wound.  Approximately 3 cm in diameter with some fibrinous tissue no foul odor no surrounding cellulitis being addressed by nursing and wound care with twice daily dressing changes

## 2020-04-19 LAB — LITHIUM LEVEL: Lithium Lvl: 0.26 mmol/L — ABNORMAL LOW (ref 0.60–1.20)

## 2020-04-19 LAB — CBC
HCT: 31.7 % — ABNORMAL LOW (ref 39.0–52.0)
Hemoglobin: 10.5 g/dL — ABNORMAL LOW (ref 13.0–17.0)
MCH: 32.1 pg (ref 26.0–34.0)
MCHC: 33.1 g/dL (ref 30.0–36.0)
MCV: 96.9 fL (ref 80.0–100.0)
Platelets: 401 10*3/uL — ABNORMAL HIGH (ref 150–400)
RBC: 3.27 MIL/uL — ABNORMAL LOW (ref 4.22–5.81)
RDW: 13.1 % (ref 11.5–15.5)
WBC: 9.7 10*3/uL (ref 4.0–10.5)
nRBC: 0 % (ref 0.0–0.2)

## 2020-04-19 LAB — VANCOMYCIN, TROUGH: Vancomycin Tr: 12 ug/mL — ABNORMAL LOW (ref 15–20)

## 2020-04-19 NOTE — Progress Notes (Signed)
    [facility] has been requested to perform a transesophageal echocardiogram on Andrew Chaney for bacteremia.  After careful review of history and examination, the risks and benefits of transesophageal echocardiogram have been explained including risks of esophageal damage, perforation (1:10,000 risk), bleeding, pharyngeal hematoma as well as other potential complications associated with conscious sedation including aspiration, arrhythmia, respiratory failure and death. Alternatives to treatment were discussed, questions were answered. Patient is willing to proceed.   Kathyrn Drown, NP  04/19/2020 1:36 PM

## 2020-04-19 NOTE — Progress Notes (Signed)
Pt sleeping at this time and refusing PRN melatonin that he was requesting earlier. Will reassess CIWA around MN.

## 2020-04-19 NOTE — Progress Notes (Signed)
Occupational Therapy Treatment Patient Details Name: Andrew Chaney MRN: [id number] DOB: 11-24-1955 Today's Date: 04/19/2020    History of present illness 65 y.o. male with medical history significant of bipolar disorder homelessness recent calcaneus fracture, history of MSSA bacteremia, cannabis abuse, seizure disorder, psychosis, chronic hepatitis C presented 04/15/20 with left foot pain with purulent drainage and rt lateral hip pressure ulcer. 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 Pt reports 10/10 pain ( RN had already premedicated) upon arrival initially declining session but agreeable with encouragement. Pt making good progress towards OT goals as pt improving activity tolerance with pt able to hop towards chair ~ 3 ft with RW and MIN A +2 for safety. Pt reports dizziness with mobility however BP WFL. Pt would continue to benefit from skilled occupational therapy while admitted and after d/c to address the below listed limitations in order to improve overall functional mobility and facilitate independence with BADL participation. DC plan remains appropriate, will follow acutely per POC.    pt on RA during session with SpO2 >92%, pt endorses dizziness upon sitting EOB- supine BP- 102/73, BP EOB 110/78   Follow Up Recommendations  CIR;Supervision/Assistance - 24 hour    Equipment Recommendations  3 in 1 bedside commode;Wheelchair (measurements OT)    Recommendations for Other Services      Precautions / Restrictions Precautions Precautions: Fall Precaution Comments: VAC Required Braces or Orthoses: Other Brace Other Brace: residual limb protector Restrictions Weight Bearing Restrictions: No       Mobility Bed Mobility Overal bed mobility: Needs Assistance Bed Mobility: Supine to Sit     Supine to sit: Min assist     General bed mobility comments: light MIN A to assist with elevating trunk into sitting     Transfers Overall transfer level: Needs assistance Equipment used: Rolling walker (2 wheeled) Transfers: Sit to/from Stand Sit to Stand: Min guard;+2 physical assistance;Min assist         General transfer comment: min guard assist +2 for safety to rise from EOB, cues for hand placment as pt preferring to pull on RW. MIN steadying assist needed upon initial stand    Balance Overall balance assessment: Needs assistance Sitting-balance support: Feet supported;No upper extremity supported Sitting balance-Leahy Scale: Fair Sitting balance - Comments: able to sit with min guard assist   Standing balance support: Bilateral upper extremity supported;During functional activity Standing balance-Leahy Scale: Poor Standing balance comment: relies on UE support                           ADL either performed or assessed with clinical judgement   ADL Overall ADL's : Needs assistance/impaired Eating/Feeding: Set up;Sitting Eating/Feeding Details (indicate cue type and reason): eating oatmeal from EOB and chair with set- up assist                     Toilet Transfer: +2 for safety/equipment;Stand-pivot;Minimal assistance;RW Toilet Transfer Details (indicate cue type and reason): pt able to take functional hops to recliner ~ 3 ft         Functional mobility during ADLs: Minimal assistance;+2 for safety/equipment General ADL Comments: pt making good progress with functional ADL transfer with pt able to get to chair taking functional hops ~ 3 ft with RW and MIN A +2 for safety     Vision       Perception     Praxis  Cognition Arousal/Alertness: Awake/alert Behavior During Therapy: Anxious Overall Cognitive Status: No family/caregiver present to determine baseline cognitive functioning                                 General Comments: pt continues to anxious with mobility needing a lot of encouragment, slightly self limiting at times         Exercises     Shoulder Instructions       General Comments pt on RA during sessin with SpO2 >92%, pt endorses dizziness upon sitting EOB- supine BP- 102/73, BP EOB 110/78    Pertinent Vitals/ Pain       Pain Assessment: 0-10 Pain Score: 10-Worst pain ever Pain Location: LLE Pain Descriptors / Indicators: Guarding;Operative site guarding Pain Intervention(s): Limited activity within patient's tolerance;Monitored during session;Repositioned;Premedicated before session  Home Living                                          Prior Functioning/Environment              Frequency  Min 2X/week        Progress Toward Goals  OT Goals(current goals can now be found in the care plan section)  Progress towards OT goals: Progressing toward goals  Acute Rehab OT Goals Patient Stated Goal: to eat his oatmeal with sugar OT Goal Formulation: With patient Time For Goal Achievement: 05/01/20 Potential to Achieve Goals: Good  Plan Discharge plan remains appropriate;Frequency remains appropriate    Co-evaluation                 AM-PAC OT "6 Clicks" Daily Activity     Outcome Measure   Help from another person eating meals?: A Little (set- up) Help from another person taking care of personal grooming?: A Little Help from another person toileting, which includes using toliet, bedpan, or urinal?: A Lot Help from another person bathing (including washing, rinsing, drying)?: A Lot Help from another person to put on and taking off regular upper body clothing?: A Little Help from another person to put on and taking off regular lower body clothing?: Total 6 Click Score: 14    End of Session Equipment Utilized During Treatment: Gait belt;Rolling walker;Other (comment) (limb protector)  OT Visit Diagnosis: Unsteadiness on feet (R26.81);Hemiplegia and hemiparesis;Pain Pain - Right/Left: Left Pain - part of body: Leg   Activity Tolerance Patient tolerated  treatment well   Patient Left in chair;with call bell/phone within reach;with chair alarm set   Nurse Communication Mobility status        Time: 1037-1100 OT Time Calculation (min): 23 min  Charges: OT General Charges $OT Visit: 1 Visit OT Treatments $Self Care/Home Management : 23-37 mins  Harley Alto., COTA/L Acute Rehabilitation Services [phone]    [name] 04/19/2020, 1:29 PM

## 2020-04-19 NOTE — Progress Notes (Signed)
PROGRESS NOTE    Kiernan Atkerson  KDX:[id number] DOB: 02/22/1955 DOA: 04/15/2020 PCP: Patient, No Pcp Per (Inactive)   Brief Narrative:  Fawzi Melman is a 65 y.o. male with medical history significant of bipolar disorder homelessness recent calcaneus fracture, history of MSSA bacteremia, cannabis abuse, seizure disorder, psychosis, chronic hepatitis C. patient was diagnosed with left calcaneal fracture several months ago and has had trouble ambulating since. Presented with left foot pain, found on the side of the road by [employer] reporting multiple body aches.  In the ED, he was found to have osteomyelitis of left foot.  He was initially admitted to hospitalist service.  Orthopedics was consulted.  Started on broad-spectrum antibiotics.  Resuscitated with fluid initially.  He continued to have hypotension so PCCM was consulted.  He was then diagnosed with septic shock and required pressors.  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  Transferred under [place] on 04/30/2020.  Scheduled for TEE on Monday.  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  Has wound VAC attached.  Managed by orthopedics.  Pain controlled.  Right hip pressure ulcer from sleeping on concrete: Due to being homeless.  Wound care.  Severe protein calorie malnutrition.  Nutrition on board.  MRSA bacteremia: Blood culture from 04/15/2020 +.  Blood culture from 04/17/2020 -ve thus far.  ID on board.  Patient remains on vancomycin.  Transthoracic echo with normal ejection fraction and no evidence of vegetation.  Per ID note, they have talked to the cardiology and patient is scheduled for TEE on Monday.  Homelessness: Will be difficult displacement.  TOC  on board.  Bipolar disorder: Patient noncompliant and not on any medications.  Patient was started on lithium.  Seen by psychiatry.  They recommended continuing this.  Lithium levels are low today.  Continue current management.  He has no symptoms at this point in time.  History of seizure disorder: Continue Keppra.  History of alcohol abuse: CIWA under 10.  Continue CIWA protocol.  No signs of withdrawal currently.  DVT prophylaxis: enoxaparin (LOVENOX) injection 40 mg Start: 04/17/20 1400 SCD's Start: 04/16/20 1925 SCDs Start: 04/16/20 1249   Code Status: Full Code  Family Communication: None present at bedside.  Plan of care discussed with patient in length and he verbalized understanding and agreed with it.  Status is: Inpatient  Remains inpatient appropriate because:Inpatient level of care appropriate due to severity of illness   Dispo: The patient is from: Homeless              Anticipated d/c is to: snf              Patient currently is not medically stable to d/c.   Difficult to place patient Yes        Estimated body mass index is 21.23 kg/m as calculated from the following:   Height as of this encounter: 6' (1.829 m).   Weight as of this encounter: 71 kg.  Pressure Injury 04/15/20 Hip Right Unstageable - Full thickness tissue loss in which the base of the injury is covered by slough (yellow, tan, gray, green or brown) and/or eschar (tan, brown or black) in the wound bed. 4x4 R  hip  (Active)  04/15/20 1900  Location: Hip  Location Orientation: Right  Staging: Unstageable - Full thickness tissue loss in which the base of the injury is covered by slough (yellow, tan, gray, green or brown) and/or eschar (tan, brown or black) in the wound bed.  Wound Description (Comments): 4x4 R hip   Present on Admission: Yes     Nutritional status:  Nutrition Problem: Increased nutrient needs Etiology: wound healing   Signs/Symptoms: estimated needs   Interventions:  Ensure Enlive (each supplement provides 350kcal and 20 grams of protein),Prostat,MVI    Consultants:   Orthopedics  Procedures:   Left BKA  Antimicrobials:  Anti-infectives (From admission, onward)   Start     Dose/Rate Route Frequency Ordered Stop   04/16/20 0630  vancomycin (VANCOREADY) IVPB 1000 mg/200 mL       "Followed by" Linked Group Details   1,000 mg 200 mL/hr over 60 Minutes Intravenous Every 12 hours 04/15/20 1824     04/16/20 0200  metroNIDAZOLE (FLAGYL) IVPB 500 mg  Status:  Discontinued        500 mg 100 mL/hr over 60 Minutes Intravenous Every 8 hours 04/15/20 1858 04/17/20 0945   04/15/20 1830  ceFEPIme (MAXIPIME) 2 g in sodium chloride 0.9 % 100 mL IVPB  Status:  Discontinued        2 g 200 mL/hr over 30 Minutes Intravenous Every 8 hours 04/15/20 1824 04/17/20 0849   04/15/20 1830  vancomycin (VANCOREADY) IVPB 1500 mg/300 mL       "Followed by" Linked Group Details   1,500 mg 150 mL/hr over 120 Minutes Intravenous  Once 04/15/20 1824 04/16/20 0003   04/15/20 1745  metroNIDAZOLE (FLAGYL) IVPB 500 mg        500 mg 100 mL/hr over 60 Minutes Intravenous  Once 04/15/20 1737 04/15/20 1914         Subjective: Patient seen and examined.  Has no complaints.  Objective: Vitals:   04/18/20 2030 04/18/20 2325 04/19/20 0400 04/19/20 0804  BP: 107/69 99/71 100/76 99/68  Pulse: 81 82 81 83  Resp: 20 14 18 17   Temp: 98.2 F (36.8 C) 98.2 F (36.8 C) 98 F (36.7 C) 98 F (36.7 C)  TempSrc: Oral Axillary Axillary Oral  SpO2: 92% 94% 94% 99%  Weight:      Height:        Intake/Output Summary (Last 24 hours) at 04/19/2020 1029 Last data filed at 04/18/2020 2050 Gross per 24 hour  Intake 609.38 ml  Output 1700 ml  Net -1090.62 ml   Filed Weights   04/15/20 1800  Weight: 71 kg    Examination:  General exam: Appears calm and comfortable  Respiratory system: Clear to auscultation. Respiratory effort normal. Cardiovascular system: S1 & S2 heard, RRR. No  JVD, murmurs, rubs, gallops or clicks. No pedal edema. Gastrointestinal system: Abdomen is nondistended, soft and nontender. No organomegaly or masses felt. Normal bowel sounds heard. Central nervous system: Alert and oriented. No focal neurological deficits. Extremities: Left BKA. Skin: No rashes, lesions or ulcers.  Psychiatry: Judgement and insight appear poor  Data Reviewed: I have personally reviewed following labs and imaging studies  CBC: Recent Labs  Lab 04/15/20 1151 04/16/20 0251 04/16/20 1731 04/17/20 0407 04/18/20 0100 04/19/20 0541  WBC 15.2* 17.5*  --  11.8* 16.2* 9.7  NEUTROABS 12.7* 14.4*  --   --   --   --   HGB 11.3* 10.3* 9.9* 10.5* 10.3* 10.5*  HCT 33.8* 31.1* 29.0*  30.9* 30.9* 31.7*  MCV 97.1 97.5  --  96.0 96.9 96.9  PLT 261 274  --  292 357 962*   Basic Metabolic Panel: Recent Labs  Lab 04/15/20 1151 04/16/20 0250 04/16/20 0251 04/16/20 1731 04/17/20 0407 04/17/20 0829 04/18/20 0100  NA 134*  --  132* 135 136  --  137  K 3.5  --  3.2* 3.7 4.2  --  4.6  CL 102  --  104  --  110  --  110  CO2 26  --  22  --  21*  --  23  GLUCOSE 110*  --  105*  --  158*  --  170*  BUN 13  --  16  --  16  --  21  CREATININE 0.84  --  0.87  --  0.64  --  0.79  CALCIUM 8.8*  --  7.8*  --  7.7*  --  8.0*  MG  --  1.4*  --   --   --  2.0  --   PHOS  --  1.9*  --   --   --  2.5  --    GFR: Estimated Creatinine Clearance: 96.1 mL/min (by C-G formula based on SCr of 0.79 mg/dL). Liver Function Tests: Recent Labs  Lab 04/16/20 0250 04/16/20 0251  AST 14* 14*  ALT 10 10  ALKPHOS 53 51  BILITOT 0.8 0.8  PROT 4.7* 4.5*  ALBUMIN 1.8* 1.8*   No results for input(s): LIPASE, AMYLASE in the last 168 hours. No results for input(s): AMMONIA in the last 168 hours. Coagulation Profile: Recent Labs  Lab 04/16/20 0250  INR 1.4*   Cardiac Enzymes: Recent Labs  Lab 04/16/20 0250  CKTOTAL 84   BNP (last 3 results) No results for input(s): PROBNP in the last 8760  hours. HbA1C: No results for input(s): HGBA1C in the last 72 hours. CBG: Recent Labs  Lab 04/16/20 1852 04/16/20 1854  GLUCAP 63* 98   Lipid Profile: No results for input(s): CHOL, HDL, LDLCALC, TRIG, CHOLHDL, LDLDIRECT in the last 72 hours. Thyroid Function Tests: No results for input(s): TSH, T4TOTAL, FREET4, T3FREE, THYROIDAB in the last 72 hours. Anemia Panel: No results for input(s): VITAMINB12, FOLATE, FERRITIN, TIBC, IRON, RETICCTPCT in the last 72 hours. Sepsis Labs: Recent Labs  Lab 04/15/20 1734 04/16/20 0250 04/16/20 0251 04/17/20 0407 04/18/20 0100  PROCALCITON  --  0.94  --  0.97 0.73  LATICACIDVEN 1.9  --  2.0* 1.1  --     Recent Results (from the past 240 hour(s))  Blood culture (routine x 2)     Status: Abnormal   Collection Time: 04/15/20  6:07 PM   Specimen: BLOOD  Result Value Ref Range Status   Specimen Description BLOOD BLOOD LEFT FOREARM  Final   Special Requests   Final    BOTTLES DRAWN AEROBIC AND ANAEROBIC Blood Culture results may not be optimal due to an inadequate volume of blood received in culture bottles   Culture  Setup Time   Final    GRAM POSITIVE COCCI IN BOTH AEROBIC AND ANAEROBIC BOTTLES CRITICAL VALUE NOTED.  VALUE IS CONSISTENT WITH PREVIOUSLY REPORTED AND CALLED VALUE.    Culture (A)  Final    STAPHYLOCOCCUS AUREUS SUSCEPTIBILITIES PERFORMED ON PREVIOUS CULTURE WITHIN THE LAST 5 DAYS. Performed at [facility], [place] [address], [place],  [address]    Report Status 04/18/2020 FINAL  Final  Blood culture (routine x 2)  Status: Abnormal   Collection Time: 04/15/20  6:09 PM   Specimen: BLOOD LEFT HAND  Result Value Ref Range Status   Specimen Description BLOOD LEFT HAND  Final   Special Requests   Final    BOTTLES DRAWN AEROBIC AND ANAEROBIC Blood Culture results may not be optimal due to an inadequate volume of blood received in culture bottles   Culture  Setup Time   Final    GRAM POSITIVE COCCI IN BOTH  AEROBIC AND ANAEROBIC BOTTLES CRITICAL RESULT CALLED TO, READ BACK BY AND VERIFIED WITH: PHARMD [place] AT 1219 ON 04/16/2020 BY T.SAAD Performed at [facility], [place] [address], [place], [place] [address]    Culture METHICILLIN RESISTANT STAPHYLOCOCCUS AUREUS (A)  Final   Report Status 04/18/2020 FINAL  Final   Organism ID, Bacteria METHICILLIN RESISTANT STAPHYLOCOCCUS AUREUS  Final      Susceptibility   Methicillin resistant staphylococcus aureus - MIC*    CIPROFLOXACIN >=8 RESISTANT Resistant     ERYTHROMYCIN >=8 RESISTANT Resistant     GENTAMICIN <=0.5 SENSITIVE Sensitive     OXACILLIN >=4 RESISTANT Resistant     TETRACYCLINE <=1 SENSITIVE Sensitive     VANCOMYCIN <=0.5 SENSITIVE Sensitive     TRIMETH/SULFA >=320 RESISTANT Resistant     CLINDAMYCIN <=0.25 SENSITIVE Sensitive     RIFAMPIN <=0.5 SENSITIVE Sensitive     Inducible Clindamycin NEGATIVE Sensitive     * METHICILLIN RESISTANT STAPHYLOCOCCUS AUREUS  Blood Culture ID Panel (Reflexed)     Status: Abnormal   Collection Time: 04/15/20  6:09 PM  Result Value Ref Range Status   Enterococcus faecalis NOT DETECTED NOT DETECTED Final   Enterococcus Faecium NOT DETECTED NOT DETECTED Final   Listeria monocytogenes NOT DETECTED NOT DETECTED Final   Staphylococcus species DETECTED (A) NOT DETECTED Final    Comment: CRITICAL RESULT CALLED TO, READ BACK BY AND VERIFIED WITH: PHAMD S.GRUBER AT 1219 ON 04/16/2020 BY T.SAAD.    Staphylococcus aureus (BCID) DETECTED (A) NOT DETECTED Final    Comment: Methicillin (oxacillin)-resistant Staphylococcus aureus (MRSA). MRSA is predictably resistant to beta-lactam antibiotics (except ceftaroline). Preferred therapy is vancomycin unless clinically contraindicated. Patient requires contact precautions if  hospitalized. CRITICAL RESULT CALLED TO, READ BACK BY AND VERIFIED WITH: PHAMD S.GRUBER AT 1219 ON 04/16/2020 BY T.SAAD.    Staphylococcus epidermidis NOT DETECTED NOT DETECTED Final    Staphylococcus lugdunensis NOT DETECTED NOT DETECTED Final   Streptococcus species NOT DETECTED NOT DETECTED Final   Streptococcus agalactiae NOT DETECTED NOT DETECTED Final   Streptococcus pneumoniae NOT DETECTED NOT DETECTED Final   Streptococcus pyogenes NOT DETECTED NOT DETECTED Final   A.calcoaceticus-baumannii NOT DETECTED NOT DETECTED Final   Bacteroides fragilis NOT DETECTED NOT DETECTED Final   Enterobacterales NOT DETECTED NOT DETECTED Final   Enterobacter cloacae complex NOT DETECTED NOT DETECTED Final   Escherichia coli NOT DETECTED NOT DETECTED Final   Klebsiella aerogenes NOT DETECTED NOT DETECTED Final   Klebsiella oxytoca NOT DETECTED NOT DETECTED Final   Klebsiella pneumoniae NOT DETECTED NOT DETECTED Final   Proteus species NOT DETECTED NOT DETECTED Final   Salmonella species NOT DETECTED NOT DETECTED Final   Serratia marcescens NOT DETECTED NOT DETECTED Final   Haemophilus influenzae NOT DETECTED NOT DETECTED Final   Neisseria meningitidis NOT DETECTED NOT DETECTED Final   Pseudomonas aeruginosa NOT DETECTED NOT DETECTED Final   Stenotrophomonas maltophilia NOT DETECTED NOT DETECTED Final   Candida albicans NOT DETECTED NOT DETECTED Final   Candida auris NOT DETECTED NOT  DETECTED Final   Candida glabrata NOT DETECTED NOT DETECTED Final   Candida krusei NOT DETECTED NOT DETECTED Final   Candida parapsilosis NOT DETECTED NOT DETECTED Final   Candida tropicalis NOT DETECTED NOT DETECTED Final   Cryptococcus neoformans/gattii NOT DETECTED NOT DETECTED Final   Meth resistant mecA/C and MREJ DETECTED (A) NOT DETECTED Final    Comment: CRITICAL RESULT CALLED TO, READ BACK BY AND VERIFIED WITH: PHAMD S.GRUBER AT 1219 ON 04/16/2020 BY T.SAAD. Performed at [facility], [place] [address], [place], [place] [address]   Resp Panel by RT-PCR (Flu A&B, Covid) Nasopharyngeal Swab     Status: None   Collection Time: 04/15/20  6:26 PM   Specimen: Nasopharyngeal Swab;  Nasopharyngeal(NP) swabs in vial transport medium  Result Value Ref Range Status   SARS Coronavirus 2 by RT PCR NEGATIVE NEGATIVE Final    Comment: (NOTE) SARS-CoV-2 target nucleic acids are NOT DETECTED.  The SARS-CoV-2 RNA is generally detectable in upper respiratory specimens during the acute phase of infection. The lowest concentration of SARS-CoV-2 viral copies this assay can detect is 138 copies/mL. A negative result does not preclude SARS-Cov-2 infection and should not be used as the sole basis for treatment or other patient management decisions. A negative result may occur with  improper specimen collection/handling, submission of specimen other than nasopharyngeal swab, presence of viral mutation(s) within the areas targeted by this assay, and inadequate number of viral copies(<138 copies/mL). A negative result must be combined with clinical observations, patient history, and epidemiological information. The expected result is Negative.  Fact Sheet for Patients:  EntrepreneurPulse.com.au  Fact Sheet for Healthcare Providers:  IncredibleEmployment.be  This test is no t yet approved or cleared by the [place] FDA and  has been authorized for detection and/or diagnosis of SARS-CoV-2 by FDA under an Emergency Use Authorization (EUA). This EUA will remain  in effect (meaning this test can be used) for the duration of the COVID-19 declaration under Section 564(b)(1) of the Act, 21 U.S.C.section 360bbb-3(b)(1), unless the authorization is terminated  or revoked sooner.       Influenza A by PCR NEGATIVE NEGATIVE Final   Influenza B by PCR NEGATIVE NEGATIVE Final    Comment: (NOTE) The Xpert Xpress SARS-CoV-2/FLU/RSV plus assay is intended as an aid in the diagnosis of influenza from Nasopharyngeal swab specimens and should not be used as a sole basis for treatment. Nasal washings and aspirates are unacceptable for Xpert Xpress  SARS-CoV-2/FLU/RSV testing.  Fact Sheet for Patients: EntrepreneurPulse.com.au  Fact Sheet for Healthcare Providers: IncredibleEmployment.be  This test is not yet approved or cleared by the [place] FDA and has been authorized for detection and/or diagnosis of SARS-CoV-2 by FDA under an Emergency Use Authorization (EUA). This EUA will remain in effect (meaning this test can be used) for the duration of the COVID-19 declaration under Section 564(b)(1) of the Act, 21 U.S.C. section 360bbb-3(b)(1), unless the authorization is terminated or revoked.  Performed at [facility], [place] [address], [place], [place] [address]   MRSA PCR Screening     Status: None   Collection Time: 04/16/20  8:58 PM   Specimen: Nasopharyngeal  Result Value Ref Range Status   MRSA by PCR NEGATIVE NEGATIVE Final    Comment:        The GeneXpert MRSA Assay (FDA approved for NASAL specimens only), is one component of a comprehensive MRSA colonization surveillance program. It is not intended to diagnose MRSA infection nor to guide  or monitor treatment for MRSA infections. Performed at [facility], [place] [address], [place], [place] [address]   Culture, blood (routine x 2)     Status: None (Preliminary result)   Collection Time: 04/17/20  4:53 AM   Specimen: BLOOD LEFT HAND  Result Value Ref Range Status   Specimen Description BLOOD LEFT HAND  Final   Special Requests   Final    BOTTLES DRAWN AEROBIC AND ANAEROBIC Blood Culture adequate volume   Culture   Final    NO GROWTH 1 DAY Performed at [facility], [place] [address] [place], [place] [address]    Report Status PENDING  Incomplete  Culture, blood (routine x 2)     Status: None (Preliminary result)   Collection Time: 04/17/20  4:57 AM   Specimen: BLOOD  Result Value Ref Range Status   Specimen Description BLOOD LEFT ANTECUBITAL  Final   Special Requests   Final    BOTTLES DRAWN AEROBIC  AND ANAEROBIC Blood Culture adequate volume   Culture   Final    NO GROWTH 1 DAY Performed at [facility], [place] [address], [place], [place] [address]    Report Status PENDING  Incomplete      Radiology Studies: ECHOCARDIOGRAM COMPLETE  Result Date: 04/17/2020    ECHOCARDIOGRAM REPORT   Patient Name:   CACE OSORTO Date of Exam: 04/17/2020 Medical Rec #:  [id number]      Height:       72.0 in Accession #:    [id number]     Weight:       156.5 lb Date of Birth:  March 27, 1955     BSA:          1.920 m Patient Age:    41 years       BP:           100/64 mmHg Patient Gender: M              HR:           81 bpm. Exam Location:  Inpatient Procedure: 2D Echo, 3D Echo, Cardiac Doppler and Color Doppler Indications:    Fever.  History:        Patient has prior history of Echocardiogram examinations, most                 recent 08/03/2019. Signs/Symptoms:Altered Mental Status and                 Bacteremia. Cancer.  Sonographer:    Roseanna Rainbow RDCS Referring Phys: [place]  1. Left ventricular ejection fraction, by estimation, is 60 to 65%. The left ventricle has normal function. The left ventricle has no regional wall motion abnormalities. There is mild left ventricular hypertrophy. Left ventricular diastolic parameters were normal.  2. Right ventricular systolic function is normal. The right ventricular size is normal. There is normal pulmonary artery systolic pressure.  3. Some thickening of the base of the anterior mitral leaflet does not look like a vegetation . The mitral valve is abnormal. Trivial mitral valve regurgitation. No evidence of mitral stenosis.  4. The aortic valve is normal in structure. Aortic valve regurgitation is not visualized. No aortic stenosis is present.  5. Aortic dilatation noted. There is mild dilatation of the ascending aorta, measuring 41 mm.  6. The inferior vena cava is normal in size with greater than 50% respiratory variability, suggesting right atrial  pressure of 3 mmHg. FINDINGS  Left Ventricle: Left ventricular ejection fraction, by estimation, is 60 to 65%. The left ventricle has normal function. The left ventricle has no regional wall motion abnormalities. The left ventricular internal cavity size was normal in size. There is  mild left ventricular hypertrophy. Left ventricular diastolic parameters were normal. Right Ventricle: The right ventricular size is normal. No increase in right ventricular wall thickness. Right ventricular systolic function is normal. There is normal pulmonary artery systolic pressure. The tricuspid regurgitant velocity is 2.30 m/s, and  with an assumed right atrial pressure of 3 mmHg, the estimated right ventricular systolic pressure is 54.2 mmHg. Left Atrium: Left atrial size was normal in size. Right Atrium: Right atrial size was normal in size. Pericardium: There is no evidence of pericardial effusion. Mitral Valve: Some thickening of the base of the anterior mitral leaflet does not look like a vegetation. The mitral valve is abnormal. Trivial mitral valve regurgitation. No evidence of mitral valve stenosis. Tricuspid Valve: The tricuspid valve is normal in structure. Tricuspid valve regurgitation is mild . No evidence of tricuspid stenosis. Aortic Valve: The aortic valve is normal in structure. Aortic valve regurgitation is not visualized. No aortic stenosis is present. Pulmonic Valve: The pulmonic valve was normal in structure. Pulmonic valve regurgitation is not visualized. No evidence of pulmonic stenosis. Aorta: The aortic root is normal in size and structure and aortic dilatation noted. There is mild dilatation of the ascending aorta, measuring 41 mm. Venous: The inferior vena cava is normal in size with greater than 50% respiratory variability, suggesting right atrial pressure of 3 mmHg. IAS/Shunts: No atrial level shunt detected by color flow Doppler.  LEFT VENTRICLE PLAX 2D LVIDd:         5.60 cm      Diastology LVIDs:          3.50 cm      LV e' medial:    8.95 cm/s LV PW:         1.40 cm      LV E/e' medial:  9.8 LV IVS:        1.20 cm      LV e' lateral:   10.30 cm/s LVOT diam:     2.30 cm      LV E/e' lateral: 8.5 LV SV:         101 LV SV Index:   53 LVOT Area:     4.15 cm  LV Volumes (MOD) LV vol d, MOD A2C: 149.0 ml LV vol d, MOD A4C: 118.0 ml LV vol s, MOD A2C: 56.0 ml LV vol s, MOD A4C: 54.3 ml LV SV MOD A2C:     93.0 ml LV SV MOD A4C:     118.0 ml LV SV MOD BP:      77.2 ml RIGHT VENTRICLE             IVC RV S prime:     14.70 cm/s  IVC diam: 1.70 cm TAPSE (M-mode): 2.0 cm LEFT ATRIUM             Index       RIGHT ATRIUM           Index LA diam:        3.75 cm 1.95 cm/m  RA Area:     18.40 cm LA Vol (A2C):   50.2 ml 26.15 ml/m RA Volume:   49.50 ml  25.78 ml/m LA Vol (A4C):   37.6 ml 19.59 ml/m LA Biplane Vol: 48.0 ml 25.00  ml/m  AORTIC VALVE LVOT Vmax:   115.00 cm/s LVOT Vmean:  78.800 cm/s LVOT VTI:    0.244 m  AORTA Ao Root diam: 4.50 cm Ao Asc diam:  4.10 cm MITRAL VALVE               TRICUSPID VALVE MV Area (PHT): 4.06 cm    TR Peak grad:   21.2 mmHg MV Decel Time: 187 msec    TR Vmax:        230.00 cm/s MV E velocity: 87.80 cm/s MV A velocity: 89.20 cm/s  SHUNTS MV E/A ratio:  0.98        Systemic VTI:  0.24 m                            Systemic Diam: 2.30 cm Jenkins Rouge MD Electronically signed by Jenkins Rouge MD Signature Date/Time: 04/17/2020/3:17:27 PM    Final     Scheduled Meds: . (feeding supplement) PROSource Plus  30 mL Oral BID BM  . vitamin C  1,000 mg Oral Daily  . Chlorhexidine Gluconate Cloth  6 each Topical Daily  . cyanocobalamin  1,000 mcg Intramuscular Weekly  . docusate sodium  100 mg Oral Daily  . enoxaparin (LOVENOX) injection  40 mg Subcutaneous Q24H  . feeding supplement  237 mL Oral TID BM  . folic acid  1 mg Oral Daily  . hydrOXYzine  50 mg Oral QHS  . levETIRAcetam  500 mg Oral BID  . lithium carbonate  300 mg Oral BID WC  . multivitamin with minerals  1 tablet Oral Daily   . pantoprazole  40 mg Oral Daily  . senna  1 tablet Oral BID  . sodium chloride flush  10-40 mL Intracatheter Q12H  . thiamine injection  100 mg Intravenous Daily  . zinc sulfate  220 mg Oral Daily   Continuous Infusions: . sodium chloride 10 mL/hr at 04/18/20 1800  . magnesium sulfate bolus IVPB    . vancomycin 1,000 mg (04/19/20 0854)     LOS: 4 days   Time spent: 30 minutes   Darliss Cheney, MD [facility]  04/19/2020, 10:29 AM   To contact the attending provider between 7A-7P or the covering provider during after hours 7P-7A, please log into the web site [email or web address].

## 2020-04-19 NOTE — Plan of Care (Signed)

## 2020-04-19 NOTE — Progress Notes (Signed)
Pharmacy Antibiotic Note  Andrew Chaney is a 65 y.o. male admitted on 04/15/2020 with several months of generalized body aches and fatigue. He was diagnosed with a left calcaneal fracture several months ago which was concerning for possible osteomyelitis on X-ray. 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 Bcx from 4/7 have remained without growth. TTE negative for vegetation but will likely need a TEE. ID recommending at least 2 weeks of antibiotics pending TEE results. Pharmacy has been consulted for vancomycin dosing.  Plan: Continue Vancomycin 1000mg  IV q12h (est AUC 450)  - Monitor clinical improvement, LOT, de-escalation - Monitor renal function - F/u TEE  Height: 6' (182.9 cm) Weight: 71 kg (156 lb 8.4 oz) IBW/kg (Calculated) : 77.6  Temp (24hrs), Avg:98 F (36.7 C), Min:97.5 F (36.4 C), Max:98.2 F (36.8 C)  Recent Labs  Lab 04/15/20 1151 04/15/20 1734 04/16/20 0251 04/17/20 0407 04/18/20 0100 04/18/20 2025 04/19/20 0541  WBC 15.2*  --  17.5* 11.8* 16.2*  --  9.7  CREATININE 0.84  --  0.87 0.64 0.79  --   --   LATICACIDVEN  --  1.9 2.0* 1.1  --   --   --   VANCOTROUGH  --   --   --   --   --   --  12*  VANCOPEAK  --   --   --   --   --  25*  --     Estimated Creatinine Clearance: 96.1 mL/min (by C-G formula based on SCr of 0.79 mg/dL).    No Known Allergies  Antimicrobials this admission: Flagyl 4/5 > 4/7 Cefepime 4/5 > 4/7 Vancomycin 4/5 >>   Microbiology results: 4/5 Bcx: GPC 4/4, staph aureus  4/6: BCID: MRSA 4/7 Bcx: ngtd  Thank you for allowing pharmacy to be a part of this patient's care.  Claudina Lick, PharmD PGY1 Acute Care Pharmacy Resident 04/19/2020 7:19 AM  Please check [email or web address] for unit-specific pharmacy phone numbers.

## 2020-04-19 NOTE — Progress Notes (Signed)
Pt presenting with symptoms consistent with CIWA score of 11. Pt has upcoming scheduled atarax and is requesting PRN pain medication and melatonin. Will reassess CIWA score after administering these meds in order to avoid oversedation. Pt c/o being uncomfortable but is unable to verbalize what is making him uncomfortable and how RN can help to make him more comfortable. Pt assisted with repositioning multiple times without relief. Pt requesting brace to be removed from LLE. No orders present stating brace can be removed at this time. Pt informed of this and he is requesting a doctor be contacted for an order. Will notifiy MD of patient request and continue to provide support as needed.

## 2020-04-19 NOTE — Plan of Care (Signed)
  Problem: Coping: Goal: Level of anxiety will decrease 04/19/2020 1958 by Brooke Bonito, RN Outcome: Progressing 04/19/2020 1957 by Brooke Bonito, RN Outcome: Progressing   Problem: Elimination: Goal: Will not experience complications related to bowel motility 04/19/2020 1958 by Brooke Bonito, RN Outcome: Progressing 04/19/2020 1957 by Brooke Bonito, RN Outcome: Progressing Goal: Will not experience complications related to urinary retention 04/19/2020 1958 by Brooke Bonito, RN Outcome: Progressing 04/19/2020 1957 by Brooke Bonito, RN Outcome: Progressing   Problem: Pain Managment: Goal: General experience of comfort will improve 04/19/2020 1958 by Brooke Bonito, RN Outcome: Progressing 04/19/2020 1957 by Brooke Bonito, RN Outcome: Progressing   Problem: Safety: Goal: Ability to remain free from injury will improve 04/19/2020 1958 by Brooke Bonito, RN Outcome: Progressing 04/19/2020 1957 by Brooke Bonito, RN Outcome: Progressing   Problem: Skin Integrity: Goal: Risk for impaired skin integrity will decrease 04/19/2020 1958 by Brooke Bonito, RN Outcome: Progressing 04/19/2020 1957 by Brooke Bonito, RN Outcome: Progressing

## 2020-04-20 LAB — CBC
HCT: 34.3 % — ABNORMAL LOW (ref 39.0–52.0)
Hemoglobin: 11.1 g/dL — ABNORMAL LOW (ref 13.0–17.0)
MCH: 32.2 pg (ref 26.0–34.0)
MCHC: 32.4 g/dL (ref 30.0–36.0)
MCV: 99.4 fL (ref 80.0–100.0)
Platelets: 424 10*3/uL — ABNORMAL HIGH (ref 150–400)
RBC: 3.45 MIL/uL — ABNORMAL LOW (ref 4.22–5.81)
RDW: 13.1 % (ref 11.5–15.5)
WBC: 10.3 10*3/uL (ref 4.0–10.5)
nRBC: 0 % (ref 0.0–0.2)

## 2020-04-20 LAB — COMPREHENSIVE METABOLIC PANEL
ALT: 19 U/L (ref 0–44)
AST: 33 U/L (ref 15–41)
Albumin: 1.9 g/dL — ABNORMAL LOW (ref 3.5–5.0)
Alkaline Phosphatase: 51 U/L (ref 38–126)
Anion gap: 3 — ABNORMAL LOW (ref 5–15)
BUN: 20 mg/dL (ref 8–23)
CO2: 27 mmol/L (ref 22–32)
Calcium: 8.2 mg/dL — ABNORMAL LOW (ref 8.9–10.3)
Chloride: 106 mmol/L (ref 98–111)
Creatinine, Ser: 0.78 mg/dL (ref 0.61–1.24)
GFR, Estimated: >60 mL/min (ref >60)
Glucose, Bld: 109 mg/dL — ABNORMAL HIGH (ref 70–99)
Potassium: 4.6 mmol/L (ref 3.5–5.1)
Sodium: 136 mmol/L (ref 135–145)
Total Bilirubin: 0.6 mg/dL (ref 0.3–1.2)
Total Protein: 4.7 g/dL — ABNORMAL LOW (ref 6.5–8.1)

## 2020-04-20 MED ORDER — HYDROMORPHONE HCL 1 MG/ML IJ SOLN: 0.5000 mg | INTRAMUSCULAR | Status: DC | PRN

## 2020-04-20 MED ORDER — OXYCODONE HCL 5 MG PO TABS
10.0000 mg | ORAL_TABLET | ORAL | Status: DC | PRN
  Administered 2020-04-20 – 2020-04-22 (×4): 10 mg via ORAL
  Filled 2020-04-20 (×4): qty 2

## 2020-04-20 MED ORDER — OXYCODONE HCL 5 MG PO TABS
5.0000 mg | ORAL_TABLET | ORAL | Status: DC | PRN
  Administered 2020-04-23 – 2020-04-27 (×7): 5 mg via ORAL
  Filled 2020-04-20 (×7): qty 1

## 2020-04-20 MED ORDER — SODIUM CHLORIDE 0.9 % IV SOLN: INTRAVENOUS | Status: DC

## 2020-04-20 MED ORDER — THIAMINE HCL 100 MG PO TABS
250.0000 mg | ORAL_TABLET | Freq: Every day | ORAL | Status: DC
  Administered 2020-04-21 – 2020-05-09 (×19): 250 mg via ORAL
  Filled 2020-04-20 (×19): qty 3

## 2020-04-20 NOTE — Plan of Care (Signed)
  Problem: Education: Goal: Knowledge of General Education information will improve Description: Including pain rating scale, medication(s)/side effects and non-pharmacologic comfort measures 04/20/2020 0234 by Brooke Bonito, RN Outcome: Progressing 04/19/2020 1958 by Brooke Bonito, RN Outcome: Progressing 04/19/2020 1957 by Brooke Bonito, RN Outcome: Progressing   Problem: Health Behavior/Discharge Planning: Goal: Ability to manage health-related needs will improve 04/20/2020 0234 by Brooke Bonito, RN Outcome: Progressing 04/19/2020 1958 by Brooke Bonito, RN Outcome: Progressing 04/19/2020 1957 by Brooke Bonito, RN Outcome: Progressing   Problem: Clinical Measurements: Goal: Ability to maintain clinical measurements within normal limits will improve 04/20/2020 0234 by Brooke Bonito, RN Outcome: Progressing 04/19/2020 1958 by Brooke Bonito, RN Outcome: Progressing 04/19/2020 1957 by Brooke Bonito, RN Outcome: Progressing Goal: Will remain free from infection 04/20/2020 0234 by Brooke Bonito, RN Outcome: Progressing 04/19/2020 1958 by Brooke Bonito, RN Outcome: Progressing 04/19/2020 1957 by Brooke Bonito, RN Outcome: Progressing Goal: Diagnostic test results will improve 04/20/2020 0234 by Brooke Bonito, RN Outcome: Progressing 04/19/2020 1958 by Brooke Bonito, RN Outcome: Progressing 04/19/2020 1957 by Brooke Bonito, RN Outcome: Progressing Goal: Respiratory complications will improve 04/20/2020 0234 by Brooke Bonito, RN Outcome: Progressing 04/19/2020 1958 by Brooke Bonito, RN Outcome: Progressing 04/19/2020 1957 by Brooke Bonito, RN Outcome: Progressing Goal: Cardiovascular complication will be avoided 04/20/2020 0234 by Brooke Bonito, RN Outcome: Progressing 04/19/2020 1958 by Brooke Bonito, RN Outcome: Progressing 04/19/2020 1957 by Brooke Bonito, RN Outcome: Progressing   Problem: Activity: Goal: Risk for activity intolerance will decrease 04/20/2020 0234 by Brooke Bonito, RN Outcome:  Progressing 04/19/2020 1958 by Brooke Bonito, RN Outcome: Progressing 04/19/2020 1957 by Brooke Bonito, RN Outcome: Progressing   Problem: Nutrition: Goal: Adequate nutrition will be maintained 04/20/2020 0234 by Brooke Bonito, RN Outcome: Progressing 04/19/2020 1958 by Brooke Bonito, RN Outcome: Progressing 04/19/2020 1957 by Brooke Bonito, RN Outcome: Progressing   Problem: Coping: Goal: Level of anxiety will decrease 04/20/2020 0234 by Brooke Bonito, RN Outcome: Progressing 04/19/2020 1958 by Brooke Bonito, RN Outcome: Progressing 04/19/2020 1957 by Brooke Bonito, RN Outcome: Progressing   Problem: Elimination: Goal: Will not experience complications related to bowel motility 04/20/2020 0234 by Brooke Bonito, RN Outcome: Progressing 04/19/2020 1958 by Brooke Bonito, RN Outcome: Progressing 04/19/2020 1957 by Brooke Bonito, RN Outcome: Progressing Goal: Will not experience complications related to urinary retention 04/20/2020 0234 by Brooke Bonito, RN Outcome: Progressing 04/19/2020 1958 by Brooke Bonito, RN Outcome: Progressing 04/19/2020 1957 by Brooke Bonito, RN Outcome: Progressing   Problem: Pain Managment: Goal: General experience of comfort will improve 04/20/2020 0234 by Brooke Bonito, RN Outcome: Progressing 04/19/2020 1958 by Brooke Bonito, RN Outcome: Progressing 04/19/2020 1957 by Brooke Bonito, RN Outcome: Progressing   Problem: Safety: Goal: Ability to remain free from injury will improve 04/20/2020 0234 by Brooke Bonito, RN Outcome: Progressing 04/19/2020 1958 by Brooke Bonito, RN Outcome: Progressing 04/19/2020 1957 by Brooke Bonito, RN Outcome: Progressing   Problem: Skin Integrity: Goal: Risk for impaired skin integrity will decrease 04/20/2020 0234 by Brooke Bonito, RN Outcome: Progressing 04/19/2020 1958 by Brooke Bonito, RN Outcome: Progressing 04/19/2020 1957 by Brooke Bonito, RN Outcome: Progressing

## 2020-04-20 NOTE — Anesthesia Postprocedure Evaluation (Signed)
Anesthesia Post Note  Patient: Andrew Chaney  Procedure(s) Performed: AMPUTATION BELOW KNEE (Left Knee)     Patient location during evaluation: PACU Anesthesia Type: General Level of consciousness: awake and alert Pain management: pain level controlled Vital Signs Assessment: post-procedure vital signs reviewed and stable Respiratory status: spontaneous breathing, nonlabored ventilation, respiratory function stable and patient connected to nasal cannula oxygen Cardiovascular status: blood pressure returned to baseline and stable Postop Assessment: no apparent nausea or vomiting Anesthetic complications: no   No complications documented.  Last Vitals:  Vitals:   04/20/20 0400 04/20/20 0600  BP: (!) 97/59 (!) 92/59  Pulse: 82 66  Resp: (!) 24 14  Temp: 37.1 C 37.1 C  SpO2: 93% 94%    Last Pain:  Vitals:   04/20/20 0600  TempSrc: Oral  PainSc:    Pain Goal: Patients Stated Pain Goal: 0 (04/18/20 1520)                 Densil Ottey COKER

## 2020-04-20 NOTE — Progress Notes (Signed)
PROGRESS NOTE    Andrew Chaney  YQI:[id number] DOB: 1955/05/09 DOA: 04/15/2020 PCP: Patient, No Pcp Per (Inactive)   Brief Narrative:  Andrew Chaney is a 65 y.o. male with medical history significant of bipolar disorder homelessness recent calcaneus fracture, history of MSSA bacteremia, cannabis abuse, seizure disorder, psychosis, chronic hepatitis C. patient was diagnosed with left calcaneal fracture several months ago and has had trouble ambulating since. Presented with left foot pain, found on the side of the road by [employer] reporting multiple body aches.  In the ED, he was found to have osteomyelitis of left foot.  He was initially admitted to hospitalist service.  Orthopedics was consulted.  Started on broad-spectrum antibiotics.  Resuscitated with fluid initially.  He continued to have hypotension so PCCM was consulted.  He was then diagnosed with septic shock and required pressors.  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  Transferred under [facility] on 04/30/2020.  Scheduled for TEE on Monday.  Subjective: Patient denies any complaints currently.   Assessment & Plan:   Active Problems:   Hyponatremia   Chronic hepatitis C (HCC)   Bipolar disorder, most recent episode manic (HCC)   Subacute osteomyelitis, left ankle and foot (HCC)   Septic shock (HCC)   Cutaneous abscess of left foot   Septic shock due to abscess and osteomyelitis of left hand foot/bacteremia. 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  Has wound VAC attached.  Managed by orthopedics.  Pain controlled.  MRSA bacteremia:  -Blood culture from 04/15/2020 +.  Blood culture from 04/17/2020 -ve thus far.  ID on board.  Patient remains on vancomycin.  Transthoracic echo with normal ejection fraction and no evidence of vegetation.  Per ID note, they have talked to the cardiology and patient is scheduled for TEE on Monday.  Right hip pressure ulcer from sleeping on concrete:  - Due to being homeless.  Wound care.  Severe protein  calorie malnutrition.   - Nutrition on board. -Magnesium and phosphorus level in a.m.  Homelessness: Will be difficult displacement.  TOC on board.  Bipolar disorder: - Patient noncompliant and not on any medications.  Patient was started on lithium.  Seen by psychiatry.  They recommended continuing this.  Lithium levels are low today.  Continue current management.  He has no symptoms at this point in time.  History of seizure disorder: Continue Keppra.  B12 defficency -Repleted  History of alcohol abuse: CIWA under 10.  Continue CIWA protocol.  No signs of withdrawal currently.  DVT prophylaxis: enoxaparin (LOVENOX) injection 40 mg Start: 04/17/20 1400 SCD's Start: 04/16/20 1925 SCDs Start: 04/16/20 1249   Code Status: Full Code  Family Communication: None present at bedside.  Plan of care discussed with patient in length and he verbalized understanding and agreed with it.  Consultants:   Orthopedics  PCCM  ID  Psychiatry  Procedures:   Left BKA  Status is: Inpatient  Remains inpatient appropriate because:Inpatient level of care appropriate due to severity of illness   Dispo: The patient is from: Homeless              Anticipated d/c is to: snf              Patient currently is not medically stable to d/c.   Difficult to place patient Yes        Estimated body mass index is 21.23 kg/m as calculated from the following:   Height as of this encounter: 6' (1.829 m).   Weight as of this  encounter: 71 kg.  Pressure Injury 04/15/20 Hip Right Unstageable - Full thickness tissue loss in which the base of the injury is covered by slough (yellow, tan, gray, green or brown) and/or eschar (tan, brown or black) in the wound bed. 4x4 R hip  (Active)  04/15/20 1900  Location: Hip  Location Orientation: Right  Staging: Unstageable - Full thickness tissue loss in which the base of the injury is covered by slough (yellow, tan, gray, green or brown) and/or eschar (tan, brown  or black) in the wound bed.  Wound Description (Comments): 4x4 R hip   Present on Admission: Yes     Nutritional status:  Nutrition Problem: Increased nutrient needs Etiology: wound healing   Signs/Symptoms: estimated needs   Interventions: Ensure Enlive (each supplement provides 350kcal and 20 grams of protein),Prostat,MVI       Antimicrobials:  Anti-infectives (From admission, onward)   Start     Dose/Rate Route Frequency Ordered Stop   04/16/20 0630  vancomycin (VANCOREADY) IVPB 1000 mg/200 mL       "Followed by" Linked Group Details   1,000 mg 200 mL/hr over 60 Minutes Intravenous Every 12 hours 04/15/20 1824     04/16/20 0200  metroNIDAZOLE (FLAGYL) IVPB 500 mg  Status:  Discontinued        500 mg 100 mL/hr over 60 Minutes Intravenous Every 8 hours 04/15/20 1858 04/17/20 0945   04/15/20 1830  ceFEPIme (MAXIPIME) 2 g in sodium chloride 0.9 % 100 mL IVPB  Status:  Discontinued        2 g 200 mL/hr over 30 Minutes Intravenous Every 8 hours 04/15/20 1824 04/17/20 0849   04/15/20 1830  vancomycin (VANCOREADY) IVPB 1500 mg/300 mL       "Followed by" Linked Group Details   1,500 mg 150 mL/hr over 120 Minutes Intravenous  Once 04/15/20 1824 04/16/20 0003   04/15/20 1745  metroNIDAZOLE (FLAGYL) IVPB 500 mg        500 mg 100 mL/hr over 60 Minutes Intravenous  Once 04/15/20 1737 04/15/20 1914          Objective: Vitals:   04/20/20 0400 04/20/20 0600 04/20/20 0755 04/20/20 1154  BP: (!) 97/59 (!) 92/59 108/81 102/64  Pulse: 82 66 79 84  Resp: (!) 24 14 14 16   Temp: 98.8 F (37.1 C) 98.7 F (37.1 C) 99 F (37.2 C) 98.5 F (36.9 C)  TempSrc: Oral Oral Oral Oral  SpO2: 93% 94% 96% 93%  Weight:      Height:        Intake/Output Summary (Last 24 hours) at 04/20/2020 1245 Last data filed at 04/20/2020 0000 Gross per 24 hour  Intake 1037.73 ml  Output 4550 ml  Net -3512.27 ml   Filed Weights   04/15/20 1800  Weight: 71 kg    Examination:  Awake Alert,  Oriented X 3, No new F.N deficits, Normal affect Symmetrical Chest wall movement, Good air movement bilaterally, CTAB RRR,No Gallops,Rubs or new Murmurs, No Parasternal Heave +ve B.Sounds, Abd Soft, No tenderness, No rebound - guarding or rigidity. No Cyanosis, left BKAeft BKA, right  hip wound   Data Reviewed: I have personally reviewed following labs and imaging studies  CBC: Recent Labs  Lab 04/15/20 1151 04/16/20 0251 04/16/20 1731 04/17/20 0407 04/18/20 0100 04/19/20 0541 04/20/20 0018  WBC 15.2* 17.5*  --  11.8* 16.2* 9.7 10.3  NEUTROABS 12.7* 14.4*  --   --   --   --   --   HGB  11.3* 10.3* 9.9* 10.5* 10.3* 10.5* 11.1*  HCT 33.8* 31.1* 29.0* 30.9* 30.9* 31.7* 34.3*  MCV 97.1 97.5  --  96.0 96.9 96.9 99.4  PLT 261 274  --  292 357 401* 308*   Basic Metabolic Panel: Recent Labs  Lab 04/15/20 1151 04/16/20 0250 04/16/20 0251 04/16/20 1731 04/17/20 0407 04/17/20 0829 04/18/20 0100 04/20/20 0018  NA 134*  --  132* 135 136  --  137 136  K 3.5  --  3.2* 3.7 4.2  --  4.6 4.6  CL 102  --  104  --  110  --  110 106  CO2 26  --  22  --  21*  --  23 27  GLUCOSE 110*  --  105*  --  158*  --  170* 109*  BUN 13  --  16  --  16  --  21 20  CREATININE 0.84  --  0.87  --  0.64  --  0.79 0.78  CALCIUM 8.8*  --  7.8*  --  7.7*  --  8.0* 8.2*  MG  --  1.4*  --   --   --  2.0  --   --   PHOS  --  1.9*  --   --   --  2.5  --   --    GFR: Estimated Creatinine Clearance: 96.1 mL/min (by C-G formula based on SCr of 0.78 mg/dL). Liver Function Tests: Recent Labs  Lab 04/16/20 0250 04/16/20 0251 04/20/20 0018  AST 14* 14* 33  ALT 10 10 19   ALKPHOS 53 51 51  BILITOT 0.8 0.8 0.6  PROT 4.7* 4.5* 4.7*  ALBUMIN 1.8* 1.8* 1.9*   No results for input(s): LIPASE, AMYLASE in the last 168 hours. No results for input(s): AMMONIA in the last 168 hours. Coagulation Profile: Recent Labs  Lab 04/16/20 0250  INR 1.4*   Cardiac Enzymes: Recent Labs  Lab 04/16/20 0250  CKTOTAL 84    BNP (last 3 results) No results for input(s): PROBNP in the last 8760 hours. HbA1C: No results for input(s): HGBA1C in the last 72 hours. CBG: Recent Labs  Lab 04/16/20 1852 04/16/20 1854  GLUCAP 63* 98   Lipid Profile: No results for input(s): CHOL, HDL, LDLCALC, TRIG, CHOLHDL, LDLDIRECT in the last 72 hours. Thyroid Function Tests: No results for input(s): TSH, T4TOTAL, FREET4, T3FREE, THYROIDAB in the last 72 hours. Anemia Panel: No results for input(s): VITAMINB12, FOLATE, FERRITIN, TIBC, IRON, RETICCTPCT in the last 72 hours. Sepsis Labs: Recent Labs  Lab 04/15/20 1734 04/16/20 0250 04/16/20 0251 04/17/20 0407 04/18/20 0100  PROCALCITON  --  0.94  --  0.97 0.73  LATICACIDVEN 1.9  --  2.0* 1.1  --     Recent Results (from the past 240 hour(s))  Blood culture (routine x 2)     Status: Abnormal   Collection Time: 04/15/20  6:07 PM   Specimen: BLOOD  Result Value Ref Range Status   Specimen Description BLOOD BLOOD LEFT FOREARM  Final   Special Requests   Final    BOTTLES DRAWN AEROBIC AND ANAEROBIC Blood Culture results may not be optimal due to an inadequate volume of blood received in culture bottles   Culture  Setup Time   Final    GRAM POSITIVE COCCI IN BOTH AEROBIC AND ANAEROBIC BOTTLES CRITICAL VALUE NOTED.  VALUE IS CONSISTENT WITH PREVIOUSLY REPORTED AND CALLED VALUE.    Culture (A)  Final    STAPHYLOCOCCUS AUREUS SUSCEPTIBILITIES  PERFORMED ON PREVIOUS CULTURE WITHIN THE LAST 5 DAYS. Performed at [facility], [place] [address], [place], [place] [address]    Report Status 04/18/2020 FINAL  Final  Blood culture (routine x 2)     Status: Abnormal   Collection Time: 04/15/20  6:09 PM   Specimen: BLOOD LEFT HAND  Result Value Ref Range Status   Specimen Description BLOOD LEFT HAND  Final   Special Requests   Final    BOTTLES DRAWN AEROBIC AND ANAEROBIC Blood Culture results may not be optimal due to an inadequate volume of blood received in culture  bottles   Culture  Setup Time   Final    GRAM POSITIVE COCCI IN BOTH AEROBIC AND ANAEROBIC BOTTLES CRITICAL RESULT CALLED TO, READ BACK BY AND VERIFIED WITH: PHARMD [place] AT 1219 ON 04/16/2020 BY T.SAAD Performed at [facility], [place] [address] [place], [place] [address]    Culture METHICILLIN RESISTANT STAPHYLOCOCCUS AUREUS (A)  Final   Report Status 04/18/2020 FINAL  Final   Organism ID, Bacteria METHICILLIN RESISTANT STAPHYLOCOCCUS AUREUS  Final      Susceptibility   Methicillin resistant staphylococcus aureus - MIC*    CIPROFLOXACIN >=8 RESISTANT Resistant     ERYTHROMYCIN >=8 RESISTANT Resistant     GENTAMICIN <=0.5 SENSITIVE Sensitive     OXACILLIN >=4 RESISTANT Resistant     TETRACYCLINE <=1 SENSITIVE Sensitive     VANCOMYCIN <=0.5 SENSITIVE Sensitive     TRIMETH/SULFA >=320 RESISTANT Resistant     CLINDAMYCIN <=0.25 SENSITIVE Sensitive     RIFAMPIN <=0.5 SENSITIVE Sensitive     Inducible Clindamycin NEGATIVE Sensitive     * METHICILLIN RESISTANT STAPHYLOCOCCUS AUREUS  Blood Culture ID Panel (Reflexed)     Status: Abnormal   Collection Time: 04/15/20  6:09 PM  Result Value Ref Range Status   Enterococcus faecalis NOT DETECTED NOT DETECTED Final   Enterococcus Faecium NOT DETECTED NOT DETECTED Final   Listeria monocytogenes NOT DETECTED NOT DETECTED Final   Staphylococcus species DETECTED (A) NOT DETECTED Final    Comment: CRITICAL RESULT CALLED TO, READ BACK BY AND VERIFIED WITH: PHAMD S.GRUBER AT 1219 ON 04/16/2020 BY T.SAAD.    Staphylococcus aureus (BCID) DETECTED (A) NOT DETECTED Final    Comment: Methicillin (oxacillin)-resistant Staphylococcus aureus (MRSA). MRSA is predictably resistant to beta-lactam antibiotics (except ceftaroline). Preferred therapy is vancomycin unless clinically contraindicated. Patient requires contact precautions if  hospitalized. CRITICAL RESULT CALLED TO, READ BACK BY AND VERIFIED WITH: PHAMD S.GRUBER AT 1219 ON 04/16/2020 BY  T.SAAD.    Staphylococcus epidermidis NOT DETECTED NOT DETECTED Final   Staphylococcus lugdunensis NOT DETECTED NOT DETECTED Final   Streptococcus species NOT DETECTED NOT DETECTED Final   Streptococcus agalactiae NOT DETECTED NOT DETECTED Final   Streptococcus pneumoniae NOT DETECTED NOT DETECTED Final   Streptococcus pyogenes NOT DETECTED NOT DETECTED Final   A.calcoaceticus-baumannii NOT DETECTED NOT DETECTED Final   Bacteroides fragilis NOT DETECTED NOT DETECTED Final   Enterobacterales NOT DETECTED NOT DETECTED Final   Enterobacter cloacae complex NOT DETECTED NOT DETECTED Final   Escherichia coli NOT DETECTED NOT DETECTED Final   Klebsiella aerogenes NOT DETECTED NOT DETECTED Final   Klebsiella oxytoca NOT DETECTED NOT DETECTED Final   Klebsiella pneumoniae NOT DETECTED NOT DETECTED Final   Proteus species NOT DETECTED NOT DETECTED Final   Salmonella species NOT DETECTED NOT DETECTED Final   Serratia marcescens NOT DETECTED NOT DETECTED Final   Haemophilus influenzae NOT DETECTED NOT DETECTED Final  Neisseria meningitidis NOT DETECTED NOT DETECTED Final   Pseudomonas aeruginosa NOT DETECTED NOT DETECTED Final   Stenotrophomonas maltophilia NOT DETECTED NOT DETECTED Final   Candida albicans NOT DETECTED NOT DETECTED Final   Candida auris NOT DETECTED NOT DETECTED Final   Candida glabrata NOT DETECTED NOT DETECTED Final   Candida krusei NOT DETECTED NOT DETECTED Final   Candida parapsilosis NOT DETECTED NOT DETECTED Final   Candida tropicalis NOT DETECTED NOT DETECTED Final   Cryptococcus neoformans/gattii NOT DETECTED NOT DETECTED Final   Meth resistant mecA/C and MREJ DETECTED (A) NOT DETECTED Final    Comment: CRITICAL RESULT CALLED TO, READ BACK BY AND VERIFIED WITH: PHAMD S.GRUBER AT 1219 ON 04/16/2020 BY T.SAAD. Performed at [facility], [place] [address], [place], [place] [address]   Resp Panel by RT-PCR (Flu A&B, Covid) Nasopharyngeal Swab     Status: None    Collection Time: 04/15/20  6:26 PM   Specimen: Nasopharyngeal Swab; Nasopharyngeal(NP) swabs in vial transport medium  Result Value Ref Range Status   SARS Coronavirus 2 by RT PCR NEGATIVE NEGATIVE Final    Comment: (NOTE) SARS-CoV-2 target nucleic acids are NOT DETECTED.  The SARS-CoV-2 RNA is generally detectable in upper respiratory specimens during the acute phase of infection. The lowest concentration of SARS-CoV-2 viral copies this assay can detect is 138 copies/mL. A negative result does not preclude SARS-Cov-2 infection and should not be used as the sole basis for treatment or other patient management decisions. A negative result may occur with  improper specimen collection/handling, submission of specimen other than nasopharyngeal swab, presence of viral mutation(s) within the areas targeted by this assay, and inadequate number of viral copies(<138 copies/mL). A negative result must be combined with clinical observations, patient history, and epidemiological information. The expected result is Negative.  Fact Sheet for Patients:  EntrepreneurPulse.com.au  Fact Sheet for Healthcare Providers:  IncredibleEmployment.be  This test is no t yet approved or cleared by the [place] FDA and  has been authorized for detection and/or diagnosis of SARS-CoV-2 by FDA under an Emergency Use Authorization (EUA). This EUA will remain  in effect (meaning this test can be used) for the duration of the COVID-19 declaration under Section 564(b)(1) of the Act, 21 U.S.C.section 360bbb-3(b)(1), unless the authorization is terminated  or revoked sooner.       Influenza A by PCR NEGATIVE NEGATIVE Final   Influenza B by PCR NEGATIVE NEGATIVE Final    Comment: (NOTE) The Xpert Xpress SARS-CoV-2/FLU/RSV plus assay is intended as an aid in the diagnosis of influenza from Nasopharyngeal swab specimens and should not be used as a sole basis for treatment.  Nasal washings and aspirates are unacceptable for Xpert Xpress SARS-CoV-2/FLU/RSV testing.  Fact Sheet for Patients: EntrepreneurPulse.com.au  Fact Sheet for Healthcare Providers: IncredibleEmployment.be  This test is not yet approved or cleared by the [place] FDA and has been authorized for detection and/or diagnosis of SARS-CoV-2 by FDA under an Emergency Use Authorization (EUA). This EUA will remain in effect (meaning this test can be used) for the duration of the COVID-19 declaration under Section 564(b)(1) of the Act, 21 U.S.C. section 360bbb-3(b)(1), unless the authorization is terminated or revoked.  Performed at [facility], [place] [address], [place], [place] [address]   MRSA PCR Screening     Status: None   Collection Time: 04/16/20  8:58 PM   Specimen: Nasopharyngeal  Result Value Ref Range Status   MRSA by PCR NEGATIVE NEGATIVE Final  Comment:        The GeneXpert MRSA Assay (FDA approved for NASAL specimens only), is one component of a comprehensive MRSA colonization surveillance program. It is not intended to diagnose MRSA infection nor to guide or monitor treatment for MRSA infections. Performed at [facility], [place] [address], [place], [place] [address]   Culture, blood (routine x 2)     Status: None (Preliminary result)   Collection Time: 04/17/20  4:53 AM   Specimen: BLOOD LEFT HAND  Result Value Ref Range Status   Specimen Description BLOOD LEFT HAND  Final   Special Requests   Final    BOTTLES DRAWN AEROBIC AND ANAEROBIC Blood Culture adequate volume   Culture   Final    NO GROWTH 3 DAYS Performed at [facility], [place] [address] [place], [place] [address]    Report Status PENDING  Incomplete  Culture, blood (routine x 2)     Status: None (Preliminary result)   Collection Time: 04/17/20  4:57 AM   Specimen: BLOOD  Result Value Ref Range Status   Specimen Description BLOOD LEFT  ANTECUBITAL  Final   Special Requests   Final    BOTTLES DRAWN AEROBIC AND ANAEROBIC Blood Culture adequate volume   Culture   Final    NO GROWTH 3 DAYS Performed at [facility], [place] [address], [place], [place] [address]    Report Status PENDING  Incomplete      Radiology Studies: No results found.  Scheduled Meds: . (feeding supplement) PROSource Plus  30 mL Oral BID BM  . vitamin C  1,000 mg Oral Daily  . Chlorhexidine Gluconate Cloth  6 each Topical Daily  . cyanocobalamin  1,000 mcg Intramuscular Weekly  . docusate sodium  100 mg Oral Daily  . enoxaparin (LOVENOX) injection  40 mg Subcutaneous Q24H  . feeding supplement  237 mL Oral TID BM  . folic acid  1 mg Oral Daily  . hydrOXYzine  50 mg Oral QHS  . levETIRAcetam  500 mg Oral BID  . lithium carbonate  300 mg Oral BID WC  . multivitamin with minerals  1 tablet Oral Daily  . pantoprazole  40 mg Oral Daily  . senna  1 tablet Oral BID  . sodium chloride flush  10-40 mL Intracatheter Q12H  . thiamine  250 mg Oral Daily  . zinc sulfate  220 mg Oral Daily   Continuous Infusions: . sodium chloride Stopped (04/20/20 1035)  . magnesium sulfate bolus IVPB    . vancomycin 1,000 mg (04/20/20 9798)     LOS: 5 days   Time spent: 30 minutes   Phillips Climes, MD [facility]  04/20/2020, 12:45 PM   To contact the attending provider between 7A-7P or the covering provider during after hours 7P-7A, please log into the web site [email or web address].

## 2020-04-21 ENCOUNTER — Inpatient Hospital Stay (HOSPITAL_COMMUNITY): Payer: Self-pay | Admitting: Anesthesiology

## 2020-04-21 ENCOUNTER — Encounter (HOSPITAL_COMMUNITY): Payer: Self-pay | Admitting: Pulmonary Disease

## 2020-04-21 ENCOUNTER — Inpatient Hospital Stay (HOSPITAL_COMMUNITY): Payer: Self-pay

## 2020-04-21 ENCOUNTER — Encounter (HOSPITAL_COMMUNITY)
Admission: EM | Disposition: A | Discharge status: Skilled Nursing Facility | Payer: Self-pay | Source: Home / Self Care | Attending: Internal Medicine

## 2020-04-21 DIAGNOSIS — R7881 Bacteremia: Secondary | ICD-10-CM

## 2020-04-21 DIAGNOSIS — Z720 Tobacco use: Secondary | ICD-10-CM

## 2020-04-21 HISTORY — PX: TEE WITHOUT CARDIOVERSION: SHX5443

## 2020-04-21 LAB — CBC
HCT: 31.6 % — ABNORMAL LOW (ref 39.0–52.0)
Hemoglobin: 10.6 g/dL — ABNORMAL LOW (ref 13.0–17.0)
MCH: 32.6 pg (ref 26.0–34.0)
MCHC: 33.5 g/dL (ref 30.0–36.0)
MCV: 97.2 fL (ref 80.0–100.0)
Platelets: 459 10*3/uL — ABNORMAL HIGH (ref 150–400)
RBC: 3.25 MIL/uL — ABNORMAL LOW (ref 4.22–5.81)
RDW: 13.2 % (ref 11.5–15.5)
WBC: 10.1 10*3/uL (ref 4.0–10.5)
nRBC: 0 % (ref 0.0–0.2)

## 2020-04-21 LAB — BASIC METABOLIC PANEL
Anion gap: 2 — ABNORMAL LOW (ref 5–15)
BUN: 15 mg/dL (ref 8–23)
CO2: 27 mmol/L (ref 22–32)
Calcium: 8.5 mg/dL — ABNORMAL LOW (ref 8.9–10.3)
Chloride: 105 mmol/L (ref 98–111)
Creatinine, Ser: 0.68 mg/dL (ref 0.61–1.24)
GFR, Estimated: >60 mL/min (ref >60)
Glucose, Bld: 100 mg/dL — ABNORMAL HIGH (ref 70–99)
Potassium: 4.4 mmol/L (ref 3.5–5.1)
Sodium: 134 mmol/L — ABNORMAL LOW (ref 135–145)

## 2020-04-21 LAB — ECHO TEE
AV Peak grad: 7.7 mmHg
Ao pk vel: 1.39 m/s

## 2020-04-21 LAB — PHOSPHORUS: Phosphorus: 3.4 mg/dL (ref 2.5–4.6)

## 2020-04-21 LAB — MAGNESIUM: Magnesium: 2 mg/dL (ref 1.7–2.4)

## 2020-04-21 SURGERY — ECHOCARDIOGRAM, TRANSESOPHAGEAL
Anesthesia: Monitor Anesthesia Care

## 2020-04-21 MED ORDER — FENTANYL CITRATE (PF) 100 MCG/2ML IJ SOLN
50.0000 ug | Freq: Once | INTRAMUSCULAR | Status: AC
  Administered 2020-04-21: 50 ug via INTRAVENOUS

## 2020-04-21 MED ORDER — LIDOCAINE 2% (20 MG/ML) 5 ML SYRINGE
INTRAMUSCULAR | Status: DC | PRN
  Administered 2020-04-21: 60 mg via INTRAVENOUS

## 2020-04-21 MED ORDER — FENTANYL CITRATE (PF) 100 MCG/2ML IJ SOLN
INTRAMUSCULAR | Status: AC
  Filled 2020-04-21: qty 2

## 2020-04-21 MED ORDER — PROPOFOL 500 MG/50ML IV EMUL
INTRAVENOUS | Status: DC | PRN
  Administered 2020-04-21: 100 ug/kg/min via INTRAVENOUS

## 2020-04-21 MED ORDER — SODIUM CHLORIDE 0.9 % IV SOLN
INTRAVENOUS | Status: AC | PRN
  Administered 2020-04-21: 20 mL via INTRAVENOUS

## 2020-04-21 MED ORDER — PROPOFOL 10 MG/ML IV BOLUS
INTRAVENOUS | Status: DC | PRN
  Administered 2020-04-21: 30 mg via INTRAVENOUS
  Administered 2020-04-21: 35 mg via INTRAVENOUS
  Administered 2020-04-21: 25 mg via INTRAVENOUS

## 2020-04-21 NOTE — Progress Notes (Signed)
Physical Therapy Treatment Patient Details Name: Andrew Chaney MRN: [id number] DOB: [birth year] Today's Date: 04/21/2020    History of Present Illness Pt is 65 y.o. male who presented on 04/15/20 with left foot pain (recent calcaneus fx) with purulent drainage and rt lateral hip pressure ulcer. 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  Pt with medical history significant of bipolar disorder homelessness recent calcaneus fracture, history of MSSA bacteremia, cannabis abuse, seizure disorder, psychosis, chronic hepatitis C    PT Comments    Pt making gradual progress but limited by anxiousness and pain.  Pt with complaints that pain meds not working but had been ~6 hr since he had them - encouraged him to take PRN meds to keep pain under control.  Pt required increased encouragement and education on transfer techniques, safety, and controlled breathing with focus on task at hand.  He was able to perform squat pivot to right side with min guard but requiring assist of 2 for standing transfers.   Follow Up Recommendations  CIR     Equipment Recommendations  Wheelchair (measurements PT);Wheelchair cushion (measurements PT)    Recommendations for Other Services Rehab consult     Precautions / Restrictions Precautions Precautions: Fall Precaution Comments: VAC Required Braces or Orthoses: Other Brace Other Brace: residual limb protector Restrictions LLE Weight Bearing: Non weight bearing    Mobility  Bed Mobility Overal bed mobility: Needs Assistance Bed Mobility: Supine to Sit;Sit to Supine     Supine to sit: Min assist Sit to supine: Min assist   General bed mobility comments: Light min A for L LE guarding due to pain; pt did throw himself backward onto pillow after first attempt to sit due to pain    Transfers Overall transfer level: Needs assistance Equipment used: Rolling walker (2 wheeled) Transfers: Sit to/from Air traffic controller  Transfers Sit to Stand: Min assist;+2 physical assistance Stand pivot transfers: Min assist;+2 physical assistance Squat pivot transfers: Min guard;+2 safety/equipment     General transfer comment: Performed multiple transfers during session with pt requiring cues for hand placement, RW/bedrail/armrest use, safety, and waiting for assist.  Performed sit to stand from bed x 1 and BSC x 1 with min A of 2 to rise, cues for RW, and cues to stand up right.  Stand pivot from bed to chair and from [facility] to chair with min A of 2 and cues to get all the way over before sitting.  Squat pivot to right from chair to bsc and from chair to bed - cues to wait for assist and use of rail but performed with min guard.  Ambulation/Gait Ambulation/Gait assistance: Min assist;+2 safety/equipment Gait Distance (Feet): 3 Feet Assistive device: Rolling walker (2 wheeled)   Gait velocity: decreased   General Gait Details: Pt was able to hop to the recliner with use of RW - increased pain requiring cues to get all the way to chair before sitting   Stairs             Wheelchair Mobility    Modified Rankin (Stroke Patients Only)       Balance Overall balance assessment: Needs assistance Sitting-balance support: Feet supported;No upper extremity supported Sitting balance-Leahy Scale: Fair     Standing balance support: Bilateral upper extremity supported;During functional activity Standing balance-Leahy Scale: Poor Standing balance comment: relies on UE support and min A at times  Cognition Arousal/Alertness: Awake/alert Behavior During Therapy: Anxious Overall Cognitive Status: No family/caregiver present to determine baseline cognitive functioning Area of Impairment: Memory;Safety/judgement;Problem solving                     Memory: Decreased short-term memory   Safety/Judgement: Decreased awareness of safety   Problem Solving: Slow  processing;Difficulty sequencing;Requires tactile cues;Requires verbal cues General Comments: Pt continues to anxious with mobility needing a lot of encouragment/education, slightly self limiting at times      Exercises Amputee Exercises Quad Sets: AROM;Both;10 reps;Supine Hip ABduction/ADduction: AROM;Left;10 reps;Supine Knee Flexion: AAROM;Left;5 reps;Seated (too tolerance) Knee Extension: AAROM;Left;5 reps;Seated (too tolerance) Straight Leg Raises: AROM;Left;10 reps;Supine Other Exercises Other Exercises: ues for exercise techniques and provided assist as needed for pain control    General Comments General comments (skin integrity, edema, etc.): VSS; Pt does reports some dizziness with sitting but all VSS including BP.  Pt does report significant pain and with complaints that meds not working.  Noted pt had not had meds since 935 and is scheduled every 4 hour prn (it was now 1530).  Discussed trying to keep pain under control rather than waiting to take meds until pain out of control with PRN pain meds.  Provided lots of calm encouragement and education during transfers and exercises      Pertinent Vitals/Pain Pain Assessment: 0-10 Pain Score: 9  Pain Location: LLE Pain Descriptors / Indicators: Guarding;Operative site guarding Pain Intervention(s): Limited activity within patient's tolerance;Monitored during session;Repositioned;RN gave pain meds during session    Home Living                      Prior Function            PT Goals (current goals can now be found in the care plan section) Acute Rehab PT Goals Patient Stated Goal: decrease pain PT Goal Formulation: With patient Time For Goal Achievement: 05/01/20 Potential to Achieve Goals: Good Progress towards PT goals: Progressing toward goals    Frequency    Min 4X/week      PT Plan Current plan remains appropriate    Co-evaluation              AM-PAC PT "6 Clicks" Mobility   Outcome  Measure  Help needed turning from your back to your side while in a flat bed without using bedrails?: None Help needed moving from lying on your back to sitting on the side of a flat bed without using bedrails?: A Little Help needed moving to and from a bed to a chair (including a wheelchair)?: A Little Help needed standing up from a chair using your arms (e.g., wheelchair or bedside chair)?: Total Help needed to walk in hospital room?: Total Help needed climbing 3-5 steps with a railing? : Total 6 Click Score: 13    End of Session Equipment Utilized During Treatment: Gait belt;Other (comment) (residual limb protector) Activity Tolerance: Patient tolerated treatment well Patient left: in bed;with call bell/phone within reach;with bed alarm set (limb protector in place) Nurse Communication: Mobility status;Patient requests pain meds PT Visit Diagnosis: Difficulty in walking, not elsewhere classified (R26.2);Pain Pain - Right/Left: Left Pain - part of body: Leg     Time: 0981-1914 PT Time Calculation (min) (ACUTE ONLY): 45 min  Charges:  $Therapeutic Exercise: 8-22 mins $Therapeutic Activity: 23-37 mins                     Kaili Castille, PT Acute  Rehab Services Pager [phone] [facility] [place] 04/21/2020, 5:02 PM

## 2020-04-21 NOTE — CV Procedure (Signed)
    TRANSESOPHAGEAL ECHOCARDIOGRAM   NAME:  Andrew Chaney   MRN: [id number] DOB:  02/09/1955   ADMIT DATE: 04/15/2020  INDICATIONS: Bacteremia  PROCEDURE:   Informed consent was obtained prior to the procedure. The risks, benefits and alternatives for the procedure were discussed and the patient comprehended these risks.  Risks include, but are not limited to, cough, sore throat, vomiting, nausea, somnolence, esophageal and stomach trauma or perforation, bleeding, low blood pressure, aspiration, pneumonia, infection, trauma to the teeth and death.    Procedural time out performed.   Patient received monitored anesthesia care under the supervision of Dr. Lanetta Inch. Patient received a total of 211 mg propofol during the procedure.  The transesophageal probe was inserted in the esophagus and stomach without difficulty and multiple views were obtained.    COMPLICATIONS:    There were no immediate complications.  FINDINGS:  LEFT VENTRICLE: EF = 60-65%. No regional wall motion abnormalities.  RIGHT VENTRICLE: Normal size and function.   LEFT ATRIUM: No thrombus/mass.  LEFT ATRIAL APPENDAGE: No thrombus/mass.   RIGHT ATRIUM: No thrombus/mass.  AORTIC VALVE:  Trileaflet. No regurgitation. No vegetation.  MITRAL VALVE:    Slightly degenerative structure. Trivial regurgitation. No vegetation.  TRICUSPID VALVE: Normal structure. Mild regurgitation. No vegetation.  PULMONIC VALVE: Grossly normal structure. Trivial regurgitation. No apparent vegetation.  INTERATRIAL SEPTUM: No PFO or ASD seen by color Doppler.  PERICARDIUM: Trivial effusion noted.  DESCENDING AORTA: No significant plaque seen   CONCLUSION: No evidence of endocarditis   Buford Dresser, MD, PhD [facility]  [address] [place], [place] [address] [phone]   8:02 AM

## 2020-04-21 NOTE — Progress Notes (Signed)
OT Cancellation Note  Patient Details Name: Andrew Chaney MRN: [id number] DOB: August 22, 1955   Cancelled Treatment:    Reason Eval/Treat Not Completed: Patient declined, no reason specified. Pt stated "no thank you, not today". Pt with eyes closed in fetal position in bed. OT will follow up next available time  Britt Bottom 04/21/2020, 2:00 PM

## 2020-04-21 NOTE — Interval H&P Note (Signed)
History and Physical Interval Note:  04/21/2020 7:23 AM  Andrew Chaney  has presented today for surgery, with the diagnosis of Bacteremia.  The various methods of treatment have been discussed with the patient and family. After consideration of risks, benefits and other options for treatment, the patient has consented to  Procedure(s): TRANSESOPHAGEAL ECHOCARDIOGRAM (TEE) (N/A) as a surgical intervention.  The patient's history has been reviewed, patient examined, no change in status, stable for surgery.  I have reviewed the patient's chart and labs.  Questions were answered to the patient's satisfaction.     Saraiya Kozma Harrell Gave

## 2020-04-21 NOTE — Progress Notes (Signed)
[facility] for Infectious Disease  Date of Admission:  04/15/2020     Total days of antibiotics 7         ASSESSMENT:  Mr. Oshita TEE was without evidence of endocarditis and blood cultures from 4/7 remain without growth to date. POD #5 from left below knee amputation and appears to be healing without complication. Wound care continues on right hip pressure ulcer. Ideally would like to continue treatment for 4 weeks and may consider switch to oral regimen or long acting antibiotic near the end of treatment. Last vancomycin trough was 12 and renal function remains stable. Continue current dose of vancomycin.   PLAN:  1. Continue current dose of vancomycin.  2. Wound care per orthopedic recommendations.  3. Monitor cultures for final clearance of bacteremia. 4. Bipolar disorder and pain management per primary team.   Active Problems:   Hyponatremia   Chronic hepatitis C (HCC)   Bipolar disorder, most recent episode manic (HCC)   Subacute osteomyelitis, left ankle and foot ([place])   Septic shock (HCC)   Cutaneous abscess of left foot   . (feeding supplement) PROSource Plus  30 mL Oral BID BM  . vitamin C  1,000 mg Oral Daily  . Chlorhexidine Gluconate Cloth  6 each Topical Daily  . cyanocobalamin  1,000 mcg Intramuscular Weekly  . docusate sodium  100 mg Oral Daily  . enoxaparin (LOVENOX) injection  40 mg Subcutaneous Q24H  . feeding supplement  237 mL Oral TID BM  . folic acid  1 mg Oral Daily  . hydrOXYzine  50 mg Oral QHS  . levETIRAcetam  500 mg Oral BID  . lithium carbonate  300 mg Oral BID WC  . multivitamin with minerals  1 tablet Oral Daily  . pantoprazole  40 mg Oral Daily  . senna  1 tablet Oral BID  . sodium chloride flush  10-40 mL Intracatheter Q12H  . thiamine  250 mg Oral Daily  . zinc sulfate  220 mg Oral Daily    SUBJECTIVE:  Afebrile overnight with no acute events. 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   No Known Allergies   Review  of Systems: Review of Systems  Constitutional: Negative for chills, fever and weight loss.  Respiratory: Negative for cough, shortness of breath and wheezing.   Cardiovascular: Negative for chest pain and leg swelling.  Gastrointestinal: Negative for abdominal pain, constipation, diarrhea, nausea and vomiting.  Skin: Negative for rash.      OBJECTIVE: Vitals:   04/21/20 0818 04/21/20 0823 04/21/20 0846 04/21/20 1231  BP: (!) 97/52 (!) 91/47 112/74 108/63  Pulse: 81 70 87 96  Resp: 19 15 13 20   Temp:   98 F (36.7 C) 97.7 F (36.5 C)  TempSrc:   Oral Oral  SpO2: 94% 93% 95% 95%  Weight:      Height:       Body mass index is 21.23 kg/m.  Physical Exam Constitutional:      General: He is not in acute distress.    Appearance: He is well-developed.     Comments: Lying in bed with face mask over his eyes.   Cardiovascular:     Rate and Rhythm: Normal rate and regular rhythm.     Heart sounds: Normal heart sounds.  Pulmonary:     Effort: Pulmonary effort is normal.     Breath sounds: Normal breath sounds.  Musculoskeletal:     Comments: Right hip dressing is clean and dry.  Skin:    General: Skin is warm and dry.  Neurological:     Mental Status: He is alert and oriented to person, place, and time.     Lab Results Lab Results  Component Value Date   WBC 10.1 04/21/2020   HGB 10.6 (L) 04/21/2020   HCT 31.6 (L) 04/21/2020   MCV 97.2 04/21/2020   PLT 459 (H) 04/21/2020    Lab Results  Component Value Date   CREATININE 0.68 04/21/2020   BUN 15 04/21/2020   NA 134 (L) 04/21/2020   K 4.4 04/21/2020   CL 105 04/21/2020   CO2 27 04/21/2020    Lab Results  Component Value Date   ALT 19 04/20/2020   AST 33 04/20/2020   ALKPHOS 51 04/20/2020   BILITOT 0.6 04/20/2020     Microbiology: Recent Results (from the past 240 hour(s))  Blood culture (routine x 2)     Status: Abnormal   Collection Time: 04/15/20  6:07 PM   Specimen: BLOOD  Result Value Ref Range  Status   Specimen Description BLOOD BLOOD LEFT FOREARM  Final   Special Requests   Final    BOTTLES DRAWN AEROBIC AND ANAEROBIC Blood Culture results may not be optimal due to an inadequate volume of blood received in culture bottles   Culture  Setup Time   Final    GRAM POSITIVE COCCI IN BOTH AEROBIC AND ANAEROBIC BOTTLES CRITICAL VALUE NOTED.  VALUE IS CONSISTENT WITH PREVIOUSLY REPORTED AND CALLED VALUE.    Culture (A)  Final    STAPHYLOCOCCUS AUREUS SUSCEPTIBILITIES PERFORMED ON PREVIOUS CULTURE WITHIN THE LAST 5 DAYS. Performed at [facility], [place] [address], [place], [place] [address]    Report Status 04/18/2020 FINAL  Final  Blood culture (routine x 2)     Status: Abnormal   Collection Time: 04/15/20  6:09 PM   Specimen: BLOOD LEFT HAND  Result Value Ref Range Status   Specimen Description BLOOD LEFT HAND  Final   Special Requests   Final    BOTTLES DRAWN AEROBIC AND ANAEROBIC Blood Culture results may not be optimal due to an inadequate volume of blood received in culture bottles   Culture  Setup Time   Final    GRAM POSITIVE COCCI IN BOTH AEROBIC AND ANAEROBIC BOTTLES CRITICAL RESULT CALLED TO, READ BACK BY AND VERIFIED WITH: PHARMD [place] AT 1219 ON 04/16/2020 BY T.SAAD Performed at [facility], [place] [address], [place], [place] [address]    Culture METHICILLIN RESISTANT STAPHYLOCOCCUS AUREUS (A)  Final   Report Status 04/18/2020 FINAL  Final   Organism ID, Bacteria METHICILLIN RESISTANT STAPHYLOCOCCUS AUREUS  Final      Susceptibility   Methicillin resistant staphylococcus aureus - MIC*    CIPROFLOXACIN >=8 RESISTANT Resistant     ERYTHROMYCIN >=8 RESISTANT Resistant     GENTAMICIN <=0.5 SENSITIVE Sensitive     OXACILLIN >=4 RESISTANT Resistant     TETRACYCLINE <=1 SENSITIVE Sensitive     VANCOMYCIN <=0.5 SENSITIVE Sensitive     TRIMETH/SULFA >=320 RESISTANT Resistant     CLINDAMYCIN <=0.25 SENSITIVE Sensitive     RIFAMPIN <=0.5 SENSITIVE Sensitive      Inducible Clindamycin NEGATIVE Sensitive     * METHICILLIN RESISTANT STAPHYLOCOCCUS AUREUS  Blood Culture ID Panel (Reflexed)     Status: Abnormal   Collection Time: 04/15/20  6:09 PM  Result Value Ref Range Status   Enterococcus faecalis NOT DETECTED NOT DETECTED Final   Enterococcus Faecium NOT  DETECTED NOT DETECTED Final   Listeria monocytogenes NOT DETECTED NOT DETECTED Final   Staphylococcus species DETECTED (A) NOT DETECTED Final    Comment: CRITICAL RESULT CALLED TO, READ BACK BY AND VERIFIED WITH: PHAMD S.GRUBER AT 1219 ON 04/16/2020 BY T.SAAD.    Staphylococcus aureus (BCID) DETECTED (A) NOT DETECTED Final    Comment: Methicillin (oxacillin)-resistant Staphylococcus aureus (MRSA). MRSA is predictably resistant to beta-lactam antibiotics (except ceftaroline). Preferred therapy is vancomycin unless clinically contraindicated. Patient requires contact precautions if  hospitalized. CRITICAL RESULT CALLED TO, READ BACK BY AND VERIFIED WITH: PHAMD S.GRUBER AT 1219 ON 04/16/2020 BY T.SAAD.    Staphylococcus epidermidis NOT DETECTED NOT DETECTED Final   Staphylococcus lugdunensis NOT DETECTED NOT DETECTED Final   Streptococcus species NOT DETECTED NOT DETECTED Final   Streptococcus agalactiae NOT DETECTED NOT DETECTED Final   Streptococcus pneumoniae NOT DETECTED NOT DETECTED Final   Streptococcus pyogenes NOT DETECTED NOT DETECTED Final   A.calcoaceticus-baumannii NOT DETECTED NOT DETECTED Final   Bacteroides fragilis NOT DETECTED NOT DETECTED Final   Enterobacterales NOT DETECTED NOT DETECTED Final   Enterobacter cloacae complex NOT DETECTED NOT DETECTED Final   Escherichia coli NOT DETECTED NOT DETECTED Final   Klebsiella aerogenes NOT DETECTED NOT DETECTED Final   Klebsiella oxytoca NOT DETECTED NOT DETECTED Final   Klebsiella pneumoniae NOT DETECTED NOT DETECTED Final   Proteus species NOT DETECTED NOT DETECTED Final   Salmonella species NOT DETECTED NOT DETECTED Final    Serratia marcescens NOT DETECTED NOT DETECTED Final   Haemophilus influenzae NOT DETECTED NOT DETECTED Final   Neisseria meningitidis NOT DETECTED NOT DETECTED Final   Pseudomonas aeruginosa NOT DETECTED NOT DETECTED Final   Stenotrophomonas maltophilia NOT DETECTED NOT DETECTED Final   Candida albicans NOT DETECTED NOT DETECTED Final   Candida auris NOT DETECTED NOT DETECTED Final   Candida glabrata NOT DETECTED NOT DETECTED Final   Candida krusei NOT DETECTED NOT DETECTED Final   Candida parapsilosis NOT DETECTED NOT DETECTED Final   Candida tropicalis NOT DETECTED NOT DETECTED Final   Cryptococcus neoformans/gattii NOT DETECTED NOT DETECTED Final   Meth resistant mecA/C and MREJ DETECTED (A) NOT DETECTED Final    Comment: CRITICAL RESULT CALLED TO, READ BACK BY AND VERIFIED WITH: PHAMD S.GRUBER AT 1219 ON 04/16/2020 BY T.SAAD. Performed at [facility], [place] [address] [place], [place] [address]   Resp Panel by RT-PCR (Flu A&B, Covid) Nasopharyngeal Swab     Status: None   Collection Time: 04/15/20  6:26 PM   Specimen: Nasopharyngeal Swab; Nasopharyngeal(NP) swabs in vial transport medium  Result Value Ref Range Status   SARS Coronavirus 2 by RT PCR NEGATIVE NEGATIVE Final    Comment: (NOTE) SARS-CoV-2 target nucleic acids are NOT DETECTED.  The SARS-CoV-2 RNA is generally detectable in upper respiratory specimens during the acute phase of infection. The lowest concentration of SARS-CoV-2 viral copies this assay can detect is 138 copies/mL. A negative result does not preclude SARS-Cov-2 infection and should not be used as the sole basis for treatment or other patient management decisions. A negative result may occur with  improper specimen collection/handling, submission of specimen other than nasopharyngeal swab, presence of viral mutation(s) within the areas targeted by this assay, and inadequate number of viral copies(<138 copies/mL). A negative result must be combined  with clinical observations, patient history, and epidemiological information. The expected result is Negative.  Fact Sheet for Patients:  EntrepreneurPulse.com.au  Fact Sheet for Healthcare Providers:  IncredibleEmployment.be  This test is no  t yet approved or cleared by the [place] and  has been authorized for detection and/or diagnosis of SARS-CoV-2 by FDA under an Emergency Use Authorization (EUA). This EUA will remain  in effect (meaning this test can be used) for the duration of the COVID-19 declaration under Section 564(b)(1) of the Act, 21 U.S.C.section 360bbb-3(b)(1), unless the authorization is terminated  or revoked sooner.       Influenza A by PCR NEGATIVE NEGATIVE Final   Influenza B by PCR NEGATIVE NEGATIVE Final    Comment: (NOTE) The Xpert Xpress SARS-CoV-2/FLU/RSV plus assay is intended as an aid in the diagnosis of influenza from Nasopharyngeal swab specimens and should not be used as a sole basis for treatment. Nasal washings and aspirates are unacceptable for Xpert Xpress SARS-CoV-2/FLU/RSV testing.  Fact Sheet for Patients: EntrepreneurPulse.com.au  Fact Sheet for Healthcare Providers: IncredibleEmployment.be  This test is not yet approved or cleared by the [place] FDA and has been authorized for detection and/or diagnosis of SARS-CoV-2 by FDA under an Emergency Use Authorization (EUA). This EUA will remain in effect (meaning this test can be used) for the duration of the COVID-19 declaration under Section 564(b)(1) of the Act, 21 U.S.C. section 360bbb-3(b)(1), unless the authorization is terminated or revoked.  Performed at [facility], [place] [address], [place], [place] [address]   MRSA PCR Screening     Status: None   Collection Time: 04/16/20  8:58 PM   Specimen: Nasopharyngeal  Result Value Ref Range Status   MRSA by PCR NEGATIVE NEGATIVE Final     Comment:        The GeneXpert MRSA Assay (FDA approved for NASAL specimens only), is one component of a comprehensive MRSA colonization surveillance program. It is not intended to diagnose MRSA infection nor to guide or monitor treatment for MRSA infections. Performed at [facility], [place] [address] [place], [place] [address]   Culture, blood (routine x 2)     Status: None (Preliminary result)   Collection Time: 04/17/20  4:53 AM   Specimen: BLOOD LEFT HAND  Result Value Ref Range Status   Specimen Description BLOOD LEFT HAND  Final   Special Requests   Final    BOTTLES DRAWN AEROBIC AND ANAEROBIC Blood Culture adequate volume   Culture   Final    NO GROWTH 4 DAYS Performed at [facility], [place] [address] [place], [place] [address]    Report Status PENDING  Incomplete  Culture, blood (routine x 2)     Status: None (Preliminary result)   Collection Time: 04/17/20  4:57 AM   Specimen: BLOOD  Result Value Ref Range Status   Specimen Description BLOOD LEFT ANTECUBITAL  Final   Special Requests   Final    BOTTLES DRAWN AEROBIC AND ANAEROBIC Blood Culture adequate volume   Culture   Final    NO GROWTH 4 DAYS Performed at [facility], [place] [address] [place], [place] [address]    Report Status PENDING  Incomplete     Terri Piedra, [facility] for Infectious Disease [facility]  04/21/2020  2:36 PM

## 2020-04-21 NOTE — Anesthesia Preprocedure Evaluation (Addendum)
Anesthesia Evaluation  Patient identified by MRN, date of birth, ID band Patient awake    Reviewed: Allergy & Precautions, NPO status , Patient's Chart, lab work & pertinent test results  Airway Mallampati: II  TM Distance: >3 FB Neck ROM: Full    Dental  (+) Poor Dentition, Dental Advisory Given, Missing,    Pulmonary Current Smoker,    Pulmonary exam normal breath sounds clear to auscultation       Cardiovascular Normal cardiovascular exam Rhythm:Regular Rate:Normal  Echo 04/17/20: 1. Left ventricular ejection fraction, by estimation, is 60 to 65%. The  left ventricle has normal function. The left ventricle has no regional  wall motion abnormalities. There is mild left ventricular hypertrophy.  Left ventricular diastolic parameters  were normal.  2. Right ventricular systolic function is normal. The right ventricular  size is normal. There is normal pulmonary artery systolic pressure.  3. Some thickening of the base of the anterior mitral leaflet does not  look like a vegetation . The mitral valve is abnormal. Trivial mitral  valve regurgitation. No evidence of mitral stenosis.  4. The aortic valve is normal in structure. Aortic valve regurgitation is  not visualized. No aortic stenosis is present.  5. Aortic dilatation noted. There is mild dilatation of the ascending  aorta, measuring 41 mm.  6. The inferior vena cava is normal in size with greater than 50%  respiratory variability, suggesting right atrial pressure of 3 mmHg.    Neuro/Psych Seizures -,  PSYCHIATRIC DISORDERS Bipolar Disorder Schizophrenia    GI/Hepatic negative GI ROS, (+)     substance abuse  marijuana use, Hepatitis -, C  Endo/Other  negative endocrine ROS  Renal/GU negative Renal ROS  negative genitourinary   Musculoskeletal negative musculoskeletal ROS (+)   Abdominal   Peds  Hematology  (+) Blood dyscrasia, anemia , 11.1/34.3, plt  424   Anesthesia Other Findings Bacteremia- homelessness, recent calcaneus fracture, history of MSSA bacteremia, cannabis abuse, seizure disorder, psychosis, chronic HCV. 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

## 2020-04-21 NOTE — Transfer of Care (Signed)
Immediate Anesthesia Transfer of Care Note  Patient: Andrew Chaney  Procedure(s) Performed: TRANSESOPHAGEAL ECHOCARDIOGRAM (TEE) (N/A )  Patient Location: Endoscopy Unit  Anesthesia Type:MAC  Level of Consciousness: awake, alert , oriented and patient cooperative  Airway & Oxygen Therapy: Patient Spontanous Breathing  Post-op Assessment: Report given to RN and Post -op Vital signs reviewed and stable  Post vital signs: Reviewed and stable  Last Vitals:  Vitals Value Taken Time  BP    Temp    Pulse    Resp 14   SpO2 98     Last Pain:  Vitals:   04/21/20 0723  TempSrc: Temporal  PainSc: 10-Worst pain ever      Patients Stated Pain Goal: 0 (71/06/26 9485)  Complications: No complications documented.

## 2020-04-21 NOTE — Progress Notes (Signed)
  Echocardiogram 2D Echocardiogram has been performed.  Andrew Chaney 04/21/2020, 8:11 AM

## 2020-04-21 NOTE — Anesthesia Procedure Notes (Signed)
Procedure Name: MAC Date/Time: 04/21/2020 7:43 AM Performed by: Colin Benton, CRNA Pre-anesthesia Checklist: Patient identified, Emergency Drugs available, Suction available and Patient being monitored Patient Re-evaluated:Patient Re-evaluated prior to induction Oxygen Delivery Method: Simple face mask Preoxygenation: Pre-oxygenation with 100% oxygen Induction Type: IV induction Airway Equipment and Method: Bite block Placement Confirmation: positive ETCO2 Dental Injury: Teeth and Oropharynx as per pre-operative assessment

## 2020-04-21 NOTE — Progress Notes (Signed)
PROGRESS NOTE    Andrew Chaney  WGN:[id number] DOB: 1955/08/28 DOA: 04/15/2020 PCP: Patient, No Pcp Per (Inactive)   Brief Narrative:  Andrew Chaney is a 65 y.o. male with medical history significant of bipolar disorder homelessness recent calcaneus fracture, history of MSSA bacteremia, cannabis abuse, seizure disorder, psychosis, chronic hepatitis C. patient was diagnosed with left calcaneal fracture several months ago and has had trouble ambulating since. Presented with left foot pain, found on the side of the road by [employer] reporting multiple body aches.  In the ED, he was found to have osteomyelitis of left foot.  He was initially admitted to hospitalist service.  Orthopedics was consulted.  Started on broad-spectrum antibiotics.  Resuscitated with fluid initially.  He continued to have hypotension so PCCM was consulted.  He was then diagnosed with septic shock and required pressors.  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  Transferred under [facility] on 04/30/2020.  Scheduled for TEE on Monday.  Subjective:  Patient denies any complaints currently.  Asking if he can drink Ensure.   Assessment & Plan:   Active Problems:   Hyponatremia   Chronic hepatitis C (HCC)   Bipolar disorder, most recent episode manic (HCC)   Subacute osteomyelitis, left ankle and foot (HCC)   Septic shock (HCC)   Cutaneous abscess of left foot   Septic shock due to abscess and osteomyelitis of left hand foot/bacteremia. 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  Has wound VAC attached.  Managed by orthopedics.  Pain controlled.  MRSA bacteremia:  -Blood culture from 04/15/2020 +.  Blood culture from 04/17/2020 -ve thus far.  ID on board.  Patient remains on vancomycin.  Transthoracic echo with normal ejection fraction and no evidence of vegetation.  TEE on 4/11 with no evidence of vegetation/endocarditis Right hip pressure ulcer from sleeping on concrete:  - Due to being homeless.  Wound care.  Severe protein  calorie malnutrition.   - Nutrition on board. -Magnesium and phosphorus level in a.m.  Homelessness: Will be difficult displacement.  TOC on board.  Bipolar disorder: - Patient noncompliant and not on any medications.  Patient was started on lithium.  Seen by psychiatry.  They recommended continuing this.  Lithium levels are low today.  Continue current management.  He has no symptoms at this point in time.  History of seizure disorder: Continue Keppra.  B12 defficency -Repleted  History of alcohol abuse: CIWA under 10.  Continue CIWA protocol.  No signs of withdrawal currently.  DVT prophylaxis: enoxaparin (LOVENOX) injection 40 mg Start: 04/17/20 1400 SCD's Start: 04/16/20 1925 SCDs Start: 04/16/20 1249   Code Status: Full Code  Family Communication: None present at bedside.  Plan of care discussed with patient in length and he verbalized understanding and agreed with it.  Consultants:   Orthopedics  PCCM  ID  Psychiatry  Procedures:   Left BKA  Status is: Inpatient  Remains inpatient appropriate because:Inpatient level of care appropriate due to severity of illness   Dispo: The patient is from: Homeless              Anticipated d/c is to: snf              Patient currently is not medically stable to d/c.   Difficult to place patient Yes        Estimated body mass index is 21.23 kg/m as calculated from the following:   Height as of this encounter: 6' (1.829 m).   Weight as of this encounter: 51  kg.  Pressure Injury 04/15/20 Hip Right Unstageable - Full thickness tissue loss in which the base of the injury is covered by slough (yellow, tan, gray, green or brown) and/or eschar (tan, brown or black) in the wound bed. 4x4 R hip  (Active)  04/15/20 1900  Location: Hip  Location Orientation: Right  Staging: Unstageable - Full thickness tissue loss in which the base of the injury is covered by slough (yellow, tan, gray, green or brown) and/or eschar (tan, brown  or black) in the wound bed.  Wound Description (Comments): 4x4 R hip   Present on Admission: Yes     Nutritional status:  Nutrition Problem: Increased nutrient needs Etiology: wound healing   Signs/Symptoms: estimated needs   Interventions: Ensure Enlive (each supplement provides 350kcal and 20 grams of protein),Prostat,MVI       Antimicrobials:  Anti-infectives (From admission, onward)   Start     Dose/Rate Route Frequency Ordered Stop   04/16/20 0630  vancomycin (VANCOREADY) IVPB 1000 mg/200 mL       "Followed by" Linked Group Details   1,000 mg 200 mL/hr over 60 Minutes Intravenous Every 12 hours 04/15/20 1824     04/16/20 0200  metroNIDAZOLE (FLAGYL) IVPB 500 mg  Status:  Discontinued        500 mg 100 mL/hr over 60 Minutes Intravenous Every 8 hours 04/15/20 1858 04/17/20 0945   04/15/20 1830  ceFEPIme (MAXIPIME) 2 g in sodium chloride 0.9 % 100 mL IVPB  Status:  Discontinued        2 g 200 mL/hr over 30 Minutes Intravenous Every 8 hours 04/15/20 1824 04/17/20 0849   04/15/20 1830  vancomycin (VANCOREADY) IVPB 1500 mg/300 mL       "Followed by" Linked Group Details   1,500 mg 150 mL/hr over 120 Minutes Intravenous  Once 04/15/20 1824 04/16/20 0003   04/15/20 1745  metroNIDAZOLE (FLAGYL) IVPB 500 mg        500 mg 100 mL/hr over 60 Minutes Intravenous  Once 04/15/20 1737 04/15/20 1914          Objective: Vitals:   04/21/20 0818 04/21/20 0823 04/21/20 0846 04/21/20 1231  BP: (!) 97/52 (!) 91/47 112/74 108/63  Pulse: 81 70 87 96  Resp: 19 15 13 20   Temp:   98 F (36.7 C) 97.7 F (36.5 C)  TempSrc:   Oral Oral  SpO2: 94% 93% 95% 95%  Weight:      Height:        Intake/Output Summary (Last 24 hours) at 04/21/2020 1345 Last data filed at 04/21/2020 0809 Gross per 24 hour  Intake 1255.6 ml  Output 3500 ml  Net -2244.4 ml   Filed Weights   04/15/20 1800  Weight: 71 kg    Examination:  Awake Alert, Oriented X 3, No new F.N deficits, Normal  affect Symmetrical Chest wall movement, Good air movement bilaterally, CTAB RRR,No Gallops,Rubs or new Murmurs, No Parasternal Heave +ve B.Sounds, Abd Soft, No tenderness, No rebound - guarding or rigidity. No Cyanosis, left BKAeft BKA, right  hip wound   Data Reviewed: I have personally reviewed following labs and imaging studies  CBC: Recent Labs  Lab 04/15/20 1151 04/16/20 0251 04/16/20 1731 04/17/20 0407 04/18/20 0100 04/19/20 0541 04/20/20 0018 04/21/20 0417  WBC 15.2* 17.5*  --  11.8* 16.2* 9.7 10.3 10.1  NEUTROABS 12.7* 14.4*  --   --   --   --   --   --   HGB 11.3* 10.3*   < >  10.5* 10.3* 10.5* 11.1* 10.6*  HCT 33.8* 31.1*   < > 30.9* 30.9* 31.7* 34.3* 31.6*  MCV 97.1 97.5  --  96.0 96.9 96.9 99.4 97.2  PLT 261 274  --  292 357 401* 424* 459*   < > = values in this interval not displayed.   Basic Metabolic Panel: Recent Labs  Lab 04/16/20 0250 04/16/20 0251 04/16/20 1731 04/17/20 0407 04/17/20 0829 04/18/20 0100 04/20/20 0018 04/21/20 0417  NA  --  132* 135 136  --  137 136 134*  K  --  3.2* 3.7 4.2  --  4.6 4.6 4.4  CL  --  104  --  110  --  110 106 105  CO2  --  22  --  21*  --  23 27 27   GLUCOSE  --  105*  --  158*  --  170* 109* 100*  BUN  --  16  --  16  --  21 20 15   CREATININE  --  0.87  --  0.64  --  0.79 0.78 0.68  CALCIUM  --  7.8*  --  7.7*  --  8.0* 8.2* 8.5*  MG 1.4*  --   --   --  2.0  --   --  2.0  PHOS 1.9*  --   --   --  2.5  --   --  3.4   GFR: Estimated Creatinine Clearance: 96.1 mL/min (by C-G formula based on SCr of 0.68 mg/dL). Liver Function Tests: Recent Labs  Lab 04/16/20 0250 04/16/20 0251 04/20/20 0018  AST 14* 14* 33  ALT 10 10 19   ALKPHOS 53 51 51  BILITOT 0.8 0.8 0.6  PROT 4.7* 4.5* 4.7*  ALBUMIN 1.8* 1.8* 1.9*   No results for input(s): LIPASE, AMYLASE in the last 168 hours. No results for input(s): AMMONIA in the last 168 hours. Coagulation Profile: Recent Labs  Lab 04/16/20 0250  INR 1.4*   Cardiac  Enzymes: Recent Labs  Lab 04/16/20 0250  CKTOTAL 84   BNP (last 3 results) No results for input(s): PROBNP in the last 8760 hours. HbA1C: No results for input(s): HGBA1C in the last 72 hours. CBG: Recent Labs  Lab 04/16/20 1852 04/16/20 1854  GLUCAP 63* 98   Lipid Profile: No results for input(s): CHOL, HDL, LDLCALC, TRIG, CHOLHDL, LDLDIRECT in the last 72 hours. Thyroid Function Tests: No results for input(s): TSH, T4TOTAL, FREET4, T3FREE, THYROIDAB in the last 72 hours. Anemia Panel: No results for input(s): VITAMINB12, FOLATE, FERRITIN, TIBC, IRON, RETICCTPCT in the last 72 hours. Sepsis Labs: Recent Labs  Lab 04/15/20 1734 04/16/20 0250 04/16/20 0251 04/17/20 0407 04/18/20 0100  PROCALCITON  --  0.94  --  0.97 0.73  LATICACIDVEN 1.9  --  2.0* 1.1  --     Recent Results (from the past 240 hour(s))  Blood culture (routine x 2)     Status: Abnormal   Collection Time: 04/15/20  6:07 PM   Specimen: BLOOD  Result Value Ref Range Status   Specimen Description BLOOD BLOOD LEFT FOREARM  Final   Special Requests   Final    BOTTLES DRAWN AEROBIC AND ANAEROBIC Blood Culture results may not be optimal due to an inadequate volume of blood received in culture bottles   Culture  Setup Time   Final    GRAM POSITIVE COCCI IN BOTH AEROBIC AND ANAEROBIC BOTTLES CRITICAL VALUE NOTED.  VALUE IS CONSISTENT WITH PREVIOUSLY REPORTED AND CALLED VALUE.  Culture (A)  Final    STAPHYLOCOCCUS AUREUS SUSCEPTIBILITIES PERFORMED ON PREVIOUS CULTURE WITHIN THE LAST 5 DAYS. Performed at [facility], [place] [address] [place], [place] [address]    Report Status 04/18/2020 FINAL  Final  Blood culture (routine x 2)     Status: Abnormal   Collection Time: 04/15/20  6:09 PM   Specimen: BLOOD LEFT HAND  Result Value Ref Range Status   Specimen Description BLOOD LEFT HAND  Final   Special Requests   Final    BOTTLES DRAWN AEROBIC AND ANAEROBIC Blood Culture results may not be optimal due  to an inadequate volume of blood received in culture bottles   Culture  Setup Time   Final    GRAM POSITIVE COCCI IN BOTH AEROBIC AND ANAEROBIC BOTTLES CRITICAL RESULT CALLED TO, READ BACK BY AND VERIFIED WITH: PHARMD [place] AT 1219 ON 04/16/2020 BY T.SAAD Performed at [facility], [place] [address], [place], [place] [address]    Culture METHICILLIN RESISTANT STAPHYLOCOCCUS AUREUS (A)  Final   Report Status 04/18/2020 FINAL  Final   Organism ID, Bacteria METHICILLIN RESISTANT STAPHYLOCOCCUS AUREUS  Final      Susceptibility   Methicillin resistant staphylococcus aureus - MIC*    CIPROFLOXACIN >=8 RESISTANT Resistant     ERYTHROMYCIN >=8 RESISTANT Resistant     GENTAMICIN <=0.5 SENSITIVE Sensitive     OXACILLIN >=4 RESISTANT Resistant     TETRACYCLINE <=1 SENSITIVE Sensitive     VANCOMYCIN <=0.5 SENSITIVE Sensitive     TRIMETH/SULFA >=320 RESISTANT Resistant     CLINDAMYCIN <=0.25 SENSITIVE Sensitive     RIFAMPIN <=0.5 SENSITIVE Sensitive     Inducible Clindamycin NEGATIVE Sensitive     * METHICILLIN RESISTANT STAPHYLOCOCCUS AUREUS  Blood Culture ID Panel (Reflexed)     Status: Abnormal   Collection Time: 04/15/20  6:09 PM  Result Value Ref Range Status   Enterococcus faecalis NOT DETECTED NOT DETECTED Final   Enterococcus Faecium NOT DETECTED NOT DETECTED Final   Listeria monocytogenes NOT DETECTED NOT DETECTED Final   Staphylococcus species DETECTED (A) NOT DETECTED Final    Comment: CRITICAL RESULT CALLED TO, READ BACK BY AND VERIFIED WITH: PHAMD S.GRUBER AT 1219 ON 04/16/2020 BY T.SAAD.    Staphylococcus aureus (BCID) DETECTED (A) NOT DETECTED Final    Comment: Methicillin (oxacillin)-resistant Staphylococcus aureus (MRSA). MRSA is predictably resistant to beta-lactam antibiotics (except ceftaroline). Preferred therapy is vancomycin unless clinically contraindicated. Patient requires contact precautions if  hospitalized. CRITICAL RESULT CALLED TO, READ BACK BY AND  VERIFIED WITH: PHAMD S.GRUBER AT 1219 ON 04/16/2020 BY T.SAAD.    Staphylococcus epidermidis NOT DETECTED NOT DETECTED Final   Staphylococcus lugdunensis NOT DETECTED NOT DETECTED Final   Streptococcus species NOT DETECTED NOT DETECTED Final   Streptococcus agalactiae NOT DETECTED NOT DETECTED Final   Streptococcus pneumoniae NOT DETECTED NOT DETECTED Final   Streptococcus pyogenes NOT DETECTED NOT DETECTED Final   A.calcoaceticus-baumannii NOT DETECTED NOT DETECTED Final   Bacteroides fragilis NOT DETECTED NOT DETECTED Final   Enterobacterales NOT DETECTED NOT DETECTED Final   Enterobacter cloacae complex NOT DETECTED NOT DETECTED Final   Escherichia coli NOT DETECTED NOT DETECTED Final   Klebsiella aerogenes NOT DETECTED NOT DETECTED Final   Klebsiella oxytoca NOT DETECTED NOT DETECTED Final   Klebsiella pneumoniae NOT DETECTED NOT DETECTED Final   Proteus species NOT DETECTED NOT DETECTED Final   Salmonella species NOT DETECTED NOT DETECTED Final   Serratia marcescens NOT DETECTED NOT DETECTED Final  Haemophilus influenzae NOT DETECTED NOT DETECTED Final   Neisseria meningitidis NOT DETECTED NOT DETECTED Final   Pseudomonas aeruginosa NOT DETECTED NOT DETECTED Final   Stenotrophomonas maltophilia NOT DETECTED NOT DETECTED Final   Candida albicans NOT DETECTED NOT DETECTED Final   Candida auris NOT DETECTED NOT DETECTED Final   Candida glabrata NOT DETECTED NOT DETECTED Final   Candida krusei NOT DETECTED NOT DETECTED Final   Candida parapsilosis NOT DETECTED NOT DETECTED Final   Candida tropicalis NOT DETECTED NOT DETECTED Final   Cryptococcus neoformans/gattii NOT DETECTED NOT DETECTED Final   Meth resistant mecA/C and MREJ DETECTED (A) NOT DETECTED Final    Comment: CRITICAL RESULT CALLED TO, READ BACK BY AND VERIFIED WITH: PHAMD S.GRUBER AT 1219 ON 04/16/2020 BY T.SAAD. Performed at [facility], [place] [address], [place], [place] [address]   Resp Panel by RT-PCR (Flu  A&B, Covid) Nasopharyngeal Swab     Status: None   Collection Time: 04/15/20  6:26 PM   Specimen: Nasopharyngeal Swab; Nasopharyngeal(NP) swabs in vial transport medium  Result Value Ref Range Status   SARS Coronavirus 2 by RT PCR NEGATIVE NEGATIVE Final    Comment: (NOTE) SARS-CoV-2 target nucleic acids are NOT DETECTED.  The SARS-CoV-2 RNA is generally detectable in upper respiratory specimens during the acute phase of infection. The lowest concentration of SARS-CoV-2 viral copies this assay can detect is 138 copies/mL. A negative result does not preclude SARS-Cov-2 infection and should not be used as the sole basis for treatment or other patient management decisions. A negative result may occur with  improper specimen collection/handling, submission of specimen other than nasopharyngeal swab, presence of viral mutation(s) within the areas targeted by this assay, and inadequate number of viral copies(<138 copies/mL). A negative result must be combined with clinical observations, patient history, and epidemiological information. The expected result is Negative.  Fact Sheet for Patients:  EntrepreneurPulse.com.au  Fact Sheet for Healthcare Providers:  IncredibleEmployment.be  This test is no t yet approved or cleared by the [place] FDA and  has been authorized for detection and/or diagnosis of SARS-CoV-2 by FDA under an Emergency Use Authorization (EUA). This EUA will remain  in effect (meaning this test can be used) for the duration of the COVID-19 declaration under Section 564(b)(1) of the Act, 21 U.S.C.section 360bbb-3(b)(1), unless the authorization is terminated  or revoked sooner.       Influenza A by PCR NEGATIVE NEGATIVE Final   Influenza B by PCR NEGATIVE NEGATIVE Final    Comment: (NOTE) The Xpert Xpress SARS-CoV-2/FLU/RSV plus assay is intended as an aid in the diagnosis of influenza from Nasopharyngeal swab specimens  and should not be used as a sole basis for treatment. Nasal washings and aspirates are unacceptable for Xpert Xpress SARS-CoV-2/FLU/RSV testing.  Fact Sheet for Patients: EntrepreneurPulse.com.au  Fact Sheet for Healthcare Providers: IncredibleEmployment.be  This test is not yet approved or cleared by the [place] FDA and has been authorized for detection and/or diagnosis of SARS-CoV-2 by FDA under an Emergency Use Authorization (EUA). This EUA will remain in effect (meaning this test can be used) for the duration of the COVID-19 declaration under Section 564(b)(1) of the Act, 21 U.S.C. section 360bbb-3(b)(1), unless the authorization is terminated or revoked.  Performed at [facility], [place] [address] [place], [place] [address]   MRSA PCR Screening     Status: None   Collection Time: 04/16/20  8:58 PM   Specimen: Nasopharyngeal  Result Value Ref Range Status  MRSA by PCR NEGATIVE NEGATIVE Final    Comment:        The GeneXpert MRSA Assay (FDA approved for NASAL specimens only), is one component of a comprehensive MRSA colonization surveillance program. It is not intended to diagnose MRSA infection nor to guide or monitor treatment for MRSA infections. Performed at [facility], [place] [address], [place], [place] [address]   Culture, blood (routine x 2)     Status: None (Preliminary result)   Collection Time: 04/17/20  4:53 AM   Specimen: BLOOD LEFT HAND  Result Value Ref Range Status   Specimen Description BLOOD LEFT HAND  Final   Special Requests   Final    BOTTLES DRAWN AEROBIC AND ANAEROBIC Blood Culture adequate volume   Culture   Final    NO GROWTH 4 DAYS Performed at [facility], [place] [address] [place], [place] [address]    Report Status PENDING  Incomplete  Culture, blood (routine x 2)     Status: None (Preliminary result)   Collection Time: 04/17/20  4:57 AM   Specimen: BLOOD  Result Value Ref  Range Status   Specimen Description BLOOD LEFT ANTECUBITAL  Final   Special Requests   Final    BOTTLES DRAWN AEROBIC AND ANAEROBIC Blood Culture adequate volume   Culture   Final    NO GROWTH 4 DAYS Performed at [facility], [place] [address], [place], [place] [address]    Report Status PENDING  Incomplete      Radiology Studies: No results found.  Scheduled Meds: . (feeding supplement) PROSource Plus  30 mL Oral BID BM  . vitamin C  1,000 mg Oral Daily  . Chlorhexidine Gluconate Cloth  6 each Topical Daily  . cyanocobalamin  1,000 mcg Intramuscular Weekly  . docusate sodium  100 mg Oral Daily  . enoxaparin (LOVENOX) injection  40 mg Subcutaneous Q24H  . feeding supplement  237 mL Oral TID BM  . folic acid  1 mg Oral Daily  . hydrOXYzine  50 mg Oral QHS  . levETIRAcetam  500 mg Oral BID  . lithium carbonate  300 mg Oral BID WC  . multivitamin with minerals  1 tablet Oral Daily  . pantoprazole  40 mg Oral Daily  . senna  1 tablet Oral BID  . sodium chloride flush  10-40 mL Intracatheter Q12H  . thiamine  250 mg Oral Daily  . zinc sulfate  220 mg Oral Daily   Continuous Infusions: . sodium chloride Stopped (04/20/20 0858)  . magnesium sulfate bolus IVPB    . vancomycin 1,000 mg (04/21/20 0625)     LOS: 6 days     Phillips Climes, MD [facility]  04/21/2020, 1:45 PM   To contact the attending provider between 7A-7P or the covering provider during after hours 7P-7A, please log into the web site [email or web address].

## 2020-04-21 NOTE — Anesthesia Postprocedure Evaluation (Signed)
Anesthesia Post Note  Patient: Sun Wilensky  Procedure(s) Performed: TRANSESOPHAGEAL ECHOCARDIOGRAM (TEE) (N/A )     Patient location during evaluation: Endoscopy Anesthesia Type: MAC Level of consciousness: awake and alert Pain management: pain level controlled Vital Signs Assessment: post-procedure vital signs reviewed and stable Respiratory status: spontaneous breathing, nonlabored ventilation, respiratory function stable and patient connected to nasal cannula oxygen Cardiovascular status: blood pressure returned to baseline and stable Postop Assessment: no apparent nausea or vomiting Anesthetic complications: no   No complications documented.  Last Vitals:  Vitals:   04/21/20 0823 04/21/20 0846  BP: (!) 91/47 112/74  Pulse: 70 87  Resp: 15 13  Temp:  36.7 C  SpO2: 93% 95%    Last Pain:  Vitals:   04/21/20 0846  TempSrc: Oral  PainSc:                  Simi Briel L Aston Lawhorn

## 2020-04-22 DIAGNOSIS — Z5900 Homelessness unspecified: Secondary | ICD-10-CM

## 2020-04-22 DIAGNOSIS — R7881 Bacteremia: Secondary | ICD-10-CM

## 2020-04-22 LAB — CULTURE, BLOOD (ROUTINE X 2)
Culture: NO GROWTH
Culture: NO GROWTH
Special Requests: ADEQUATE
Special Requests: ADEQUATE

## 2020-04-22 MED ORDER — QUETIAPINE FUMARATE 50 MG PO TABS
50.0000 mg | ORAL_TABLET | Freq: Every day | ORAL | Status: DC
  Administered 2020-04-22 (×2): 50 mg via ORAL
  Filled 2020-04-22 (×3): qty 1

## 2020-04-22 NOTE — Progress Notes (Signed)
Pt pulled wound vac off MD aware. Pt refusing dry dressing to stump. Pt still refusing IV access. Will continue to monitor.

## 2020-04-22 NOTE — Progress Notes (Signed)
Physical Therapy Treatment Patient Details Name: Andrew Chaney MRN: [id number] DOB: 1955/11/23 Today's Date: 04/22/2020    History of Present Illness Pt is 65 y.o. male who presented on 04/15/20 with left foot pain (recent calcaneus fx) with purulent drainage and rt lateral hip pressure ulcer. 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  Pt with medical history significant of bipolar disorder homelessness recent calcaneus fracture, history of MSSA bacteremia, cannabis abuse, seizure disorder, psychosis, chronic hepatitis C    PT Comments    Pt making slow, steady progress. Needs cues to focus on task at hand. I was able to place stump shrinker on pt's leg. Pt with anxiety that limits progress.    Follow Up Recommendations  CIR     Equipment Recommendations  Wheelchair (measurements PT);Wheelchair cushion (measurements PT)    Recommendations for Other Services Rehab consult     Precautions / Restrictions Precautions Precautions: Fall Required Braces or Orthoses: Other Brace Other Brace: residual limb protector (pt refused use) Restrictions LLE Weight Bearing: Non weight bearing    Mobility  Bed Mobility Overal bed mobility: Needs Assistance Bed Mobility: Supine to Sit     Supine to sit: Min guard     General bed mobility comments: Assist for safety    Transfers Overall transfer level: Needs assistance Equipment used: Rolling walker (2 wheeled) Transfers: Sit to/from Stand;Lateral/Scoot Transfers Sit to Stand: Min assist;+2 physical assistance        Lateral/Scoot Transfers: Min guard General transfer comment: Bed to drop arm recliner with lateral scoot with min guard for safety. Sit to stand from recliner with assist to bring hips up and for balance. Verbal cues for hand placement.  Ambulation/Gait Ambulation/Gait assistance: Min assist;+2 safety/equipment Gait Distance (Feet): 6 Feet Assistive device: Rolling walker (2 wheeled) Gait  Pattern/deviations: Step-to pattern (hop to) Gait velocity: decreased Gait velocity interpretation: <1.31 ft/sec, indicative of household ambulator General Gait Details: assist for balance and support. Intermittent flexing forward at the hip quickly due to pain   Stairs             Wheelchair Mobility    Modified Rankin (Stroke Patients Only)       Balance Overall balance assessment: Needs assistance Sitting-balance support: Feet supported;No upper extremity supported Sitting balance-Leahy Scale: Fair     Standing balance support: Bilateral upper extremity supported;During functional activity Standing balance-Leahy Scale: Poor Standing balance comment: walker and min assist for static standing                            Cognition Arousal/Alertness: Awake/alert Behavior During Therapy: Anxious Overall Cognitive Status: No family/caregiver present to determine baseline cognitive functioning Area of Impairment: Memory;Safety/judgement;Problem solving                     Memory: Decreased short-term memory   Safety/Judgement: Decreased awareness of safety   Problem Solving: Slow processing;Requires tactile cues;Requires verbal cues        Exercises      General Comments        Pertinent Vitals/Pain Pain Assessment: Faces Faces Pain Scale: Hurts even more Pain Location: LLE Pain Descriptors / Indicators: Operative site guarding;Grimacing Pain Intervention(s): Limited activity within patient's tolerance;Repositioned    Home Living                      Prior Function  PT Goals (current goals can now be found in the care plan section) Acute Rehab PT Goals Patient Stated Goal: decrease pain Progress towards PT goals: Progressing toward goals    Frequency    Min 4X/week      PT Plan Current plan remains appropriate    Co-evaluation              AM-PAC PT "6 Clicks" Mobility   Outcome Measure  Help  needed turning from your back to your side while in a flat bed without using bedrails?: None Help needed moving from lying on your back to sitting on the side of a flat bed without using bedrails?: A Little Help needed moving to and from a bed to a chair (including a wheelchair)?: A Little Help needed standing up from a chair using your arms (e.g., wheelchair or bedside chair)?: Total Help needed to walk in hospital room?: Total Help needed climbing 3-5 steps with a railing? : Total 6 Click Score: 13    End of Session Equipment Utilized During Treatment: Gait belt Activity Tolerance: Other (comment) (anxiety) Patient left: with call bell/phone within reach;in chair;with chair alarm set (limb protector in place) Nurse Communication: Mobility status PT Visit Diagnosis: Difficulty in walking, not elsewhere classified (R26.2);Pain Pain - Right/Left: Left Pain - part of body: Leg     Time: 1740-8144 PT Time Calculation (min) (ACUTE ONLY): 28 min  Charges:  $Gait Training: 8-22 mins $Therapeutic Activity: 8-22 mins                     [place] Pager [phone] Office [place] 04/22/2020, 5:40 PM

## 2020-04-22 NOTE — Progress Notes (Signed)
Patient is postop day 4 status post below-knee amputation.  Patient did remove his wound VAC yesterday.  Has well apposed wound edges swelling is controlled currently not wearing any dressing.  Wound is overall dry  Could not locate shrinkers in the room.  Have placed order for more shrinkers which should be applied directly to the skin

## 2020-04-22 NOTE — Progress Notes (Signed)
PROGRESS NOTE    Andrew Chaney  PQD:[id number] DOB: Feb 05, 1955 DOA: 04/15/2020 PCP: Patient, No Pcp Per (Inactive)   Brief Narrative:  Andrew Chaney is a 65 y.o. male with medical history significant of bipolar disorder homelessness recent calcaneus fracture, history of MSSA bacteremia, cannabis abuse, seizure disorder, psychosis, chronic hepatitis C. patient was diagnosed with left calcaneal fracture several months ago and has had trouble ambulating since. Presented with left foot pain, found on the side of the road by [employer] reporting multiple body aches.  In the ED, he was found to have osteomyelitis of left foot.  He was initially admitted to hospitalist service.  Orthopedics was consulted.  Started on broad-spectrum antibiotics.  Resuscitated with fluid initially.  He continued to have hypotension so PCCM was consulted.  He was then diagnosed with septic shock and required pressors.  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  Transferred under [facility] on 04/30/2020.  Scheduled for TEE on Monday.  Subjective:  Patient denies any complaints currently.  For more Ensure, patient took off his wound VAC overnight.    Assessment & Plan:   Active Problems:   Hyponatremia   Chronic hepatitis C (HCC)   Bipolar disorder, most recent episode manic (HCC)   Subacute osteomyelitis, left ankle and foot (HCC)   Septic shock (HCC)   Cutaneous abscess of left foot   Septic shock due to abscess and osteomyelitis of left hand foot/bacteremia. 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  Has wound VAC attached.  Managed by orthopedics.  Pain controlled. -Took off his wound VAC overnight, currently has shrinker, initially declining, but agreeable after I discussed with him, discussed with RN who will apply.  MRSA bacteremia:  -Blood culture from 04/15/2020 +.  Blood culture from 04/17/2020 -ve thus far.  ID on board.  Patient remains on vancomycin.  Transthoracic echo with normal ejection fraction and no evidence of  vegetation.  TEE on 4/11 with no evidence of vegetation/endocarditis  Right hip pressure ulcer from sleeping on concrete:  - Due to being homeless.  Wound care.  Severe protein calorie malnutrition.   - Nutrition on board. -Magnesium and phosphorus level in a.m.  Homelessness: -  Will be difficult displacement.  TOC on board.  Bipolar disorder: - Patient noncompliant and not on any medications.  Patient was started on lithium.  Seen by psychiatry.  They recommended continuing this.  Lithium levels are low today.  Continue current management.  He has no symptoms at this point in time.  History of seizure disorder: Continue Keppra.  B12 defficency -Repleted  History of alcohol abuse: CIWA under 10.  Continue CIWA protocol.  No signs of withdrawal currently.  DVT prophylaxis: enoxaparin (LOVENOX) injection 40 mg Start: 04/17/20 1400 SCD's Start: 04/16/20 1925 SCDs Start: 04/16/20 1249   Code Status: Full Code  Family Communication: None present at bedside.  Plan of care discussed with patient in length and he verbalized understanding and agreed with it.  Consultants:   Orthopedics  PCCM  ID  Psychiatry  Procedures:   Left BKA  Status is: Inpatient  Remains inpatient appropriate because:Inpatient level of care appropriate due to severity of illness   Dispo: The patient is from: Homeless              Anticipated d/c is to: snf              Patient currently is not medically stable to d/c.   Difficult to place patient Yes  Estimated body mass index is 21.23 kg/m as calculated from the following:   Height as of this encounter: 6' (1.829 m).   Weight as of this encounter: 71 kg.  Pressure Injury 04/15/20 Hip Right Unstageable - Full thickness tissue loss in which the base of the injury is covered by slough (yellow, tan, gray, green or brown) and/or eschar (tan, brown or black) in the wound bed. 4x4 R hip  (Active)  04/15/20 1900  Location: Hip  Location  Orientation: Right  Staging: Unstageable - Full thickness tissue loss in which the base of the injury is covered by slough (yellow, tan, gray, green or brown) and/or eschar (tan, brown or black) in the wound bed.  Wound Description (Comments): 4x4 R hip   Present on Admission: Yes     Nutritional status:  Nutrition Problem: Increased nutrient needs Etiology: wound healing   Signs/Symptoms: estimated needs   Interventions: Ensure Enlive (each supplement provides 350kcal and 20 grams of protein),Prostat,MVI       Antimicrobials:  Anti-infectives (From admission, onward)   Start     Dose/Rate Route Frequency Ordered Stop   04/16/20 0630  vancomycin (VANCOREADY) IVPB 1000 mg/200 mL       "Followed by" Linked Group Details   1,000 mg 200 mL/hr over 60 Minutes Intravenous Every 12 hours 04/15/20 1824     04/16/20 0200  metroNIDAZOLE (FLAGYL) IVPB 500 mg  Status:  Discontinued        500 mg 100 mL/hr over 60 Minutes Intravenous Every 8 hours 04/15/20 1858 04/17/20 0945   04/15/20 1830  ceFEPIme (MAXIPIME) 2 g in sodium chloride 0.9 % 100 mL IVPB  Status:  Discontinued        2 g 200 mL/hr over 30 Minutes Intravenous Every 8 hours 04/15/20 1824 04/17/20 0849   04/15/20 1830  vancomycin (VANCOREADY) IVPB 1500 mg/300 mL       "Followed by" Linked Group Details   1,500 mg 150 mL/hr over 120 Minutes Intravenous  Once 04/15/20 1824 04/16/20 0003   04/15/20 1745  metroNIDAZOLE (FLAGYL) IVPB 500 mg        500 mg 100 mL/hr over 60 Minutes Intravenous  Once 04/15/20 1737 04/15/20 1914          Objective: Vitals:   04/21/20 1558 04/21/20 2005 04/22/20 0020 04/22/20 0316  BP: (!) 96/58 (!) 106/59 108/63 (!) 180/65  Pulse: 97 82 90 90  Resp: 20 14 16 17   Temp: 99.1 F (37.3 C) 98.9 F (37.2 C) 98.9 F (37.2 C) 98.8 F (37.1 C)  TempSrc: Oral Axillary Axillary Axillary  SpO2: 95% 92% 95% 98%  Weight:      Height:        Intake/Output Summary (Last 24 hours) at 04/22/2020  1313 Last data filed at 04/22/2020 1200 Gross per 24 hour  Intake 617.46 ml  Output 3750 ml  Net -3132.54 ml   Filed Weights   04/15/20 1800  Weight: 71 kg    Examination:  Awake 7 answer questions appropriately, and follow commands. Symmetrical Chest wall movement, Good air movement bilaterally, CTAB RRR,No Gallops,Rubs or new Murmurs, No Parasternal Heave +ve B.Sounds, Abd Soft, No tenderness, No rebound - guarding or rigidity.  No Cyanosis, left BKAeft BKA, right  hip wound           Data Reviewed: I have personally reviewed following labs and imaging studies  CBC: Recent Labs  Lab 04/16/20 0251 04/16/20 1731 04/17/20 0407 04/18/20 0100 04/19/20 0541 04/20/20 0018  04/21/20 0417  WBC 17.5*  --  11.8* 16.2* 9.7 10.3 10.1  NEUTROABS 14.4*  --   --   --   --   --   --   HGB 10.3*   < > 10.5* 10.3* 10.5* 11.1* 10.6*  HCT 31.1*   < > 30.9* 30.9* 31.7* 34.3* 31.6*  MCV 97.5  --  96.0 96.9 96.9 99.4 97.2  PLT 274  --  292 357 401* 424* 459*   < > = values in this interval not displayed.   Basic Metabolic Panel: Recent Labs  Lab 04/16/20 0250 04/16/20 0251 04/16/20 0251 04/16/20 1731 04/17/20 0407 04/17/20 0829 04/18/20 0100 04/20/20 0018 04/21/20 0417  NA  --  132*   < > 135 136  --  137 136 134*  K  --  3.2*   < > 3.7 4.2  --  4.6 4.6 4.4  CL  --  104  --   --  110  --  110 106 105  CO2  --  22  --   --  21*  --  23 27 27   GLUCOSE  --  105*  --   --  158*  --  170* 109* 100*  BUN  --  16  --   --  16  --  21 20 15   CREATININE  --  0.87  --   --  0.64  --  0.79 0.78 0.68  CALCIUM  --  7.8*  --   --  7.7*  --  8.0* 8.2* 8.5*  MG 1.4*  --   --   --   --  2.0  --   --  2.0  PHOS 1.9*  --   --   --   --  2.5  --   --  3.4   < > = values in this interval not displayed.   GFR: Estimated Creatinine Clearance: 96.1 mL/min (by C-G formula based on SCr of 0.68 mg/dL). Liver Function Tests: Recent Labs  Lab 04/16/20 0250 04/16/20 0251 04/20/20 0018  AST  14* 14* 33  ALT 10 10 19   ALKPHOS 53 51 51  BILITOT 0.8 0.8 0.6  PROT 4.7* 4.5* 4.7*  ALBUMIN 1.8* 1.8* 1.9*   No results for input(s): LIPASE, AMYLASE in the last 168 hours. No results for input(s): AMMONIA in the last 168 hours. Coagulation Profile: Recent Labs  Lab 04/16/20 0250  INR 1.4*   Cardiac Enzymes: Recent Labs  Lab 04/16/20 0250  CKTOTAL 84   BNP (last 3 results) No results for input(s): PROBNP in the last 8760 hours. HbA1C: No results for input(s): HGBA1C in the last 72 hours. CBG: Recent Labs  Lab 04/16/20 1852 04/16/20 1854  GLUCAP 63* 98   Lipid Profile: No results for input(s): CHOL, HDL, LDLCALC, TRIG, CHOLHDL, LDLDIRECT in the last 72 hours. Thyroid Function Tests: No results for input(s): TSH, T4TOTAL, FREET4, T3FREE, THYROIDAB in the last 72 hours. Anemia Panel: No results for input(s): VITAMINB12, FOLATE, FERRITIN, TIBC, IRON, RETICCTPCT in the last 72 hours. Sepsis Labs: Recent Labs  Lab 04/15/20 1734 04/16/20 0250 04/16/20 0251 04/17/20 0407 04/18/20 0100  PROCALCITON  --  0.94  --  0.97 0.73  LATICACIDVEN 1.9  --  2.0* 1.1  --     Recent Results (from the past 240 hour(s))  Blood culture (routine x 2)     Status: Abnormal   Collection Time: 04/15/20  6:07 PM   Specimen: BLOOD  Result  Value Ref Range Status   Specimen Description BLOOD BLOOD LEFT FOREARM  Final   Special Requests   Final    BOTTLES DRAWN AEROBIC AND ANAEROBIC Blood Culture results may not be optimal due to an inadequate volume of blood received in culture bottles   Culture  Setup Time   Final    GRAM POSITIVE COCCI IN BOTH AEROBIC AND ANAEROBIC BOTTLES CRITICAL VALUE NOTED.  VALUE IS CONSISTENT WITH PREVIOUSLY REPORTED AND CALLED VALUE.    Culture (A)  Final    STAPHYLOCOCCUS AUREUS SUSCEPTIBILITIES PERFORMED ON PREVIOUS CULTURE WITHIN THE LAST 5 DAYS. Performed at [facility], [place] [address] [place], [place] [address]    Report Status 04/18/2020  FINAL  Final  Blood culture (routine x 2)     Status: Abnormal   Collection Time: 04/15/20  6:09 PM   Specimen: BLOOD LEFT HAND  Result Value Ref Range Status   Specimen Description BLOOD LEFT HAND  Final   Special Requests   Final    BOTTLES DRAWN AEROBIC AND ANAEROBIC Blood Culture results may not be optimal due to an inadequate volume of blood received in culture bottles   Culture  Setup Time   Final    GRAM POSITIVE COCCI IN BOTH AEROBIC AND ANAEROBIC BOTTLES CRITICAL RESULT CALLED TO, READ BACK BY AND VERIFIED WITH: PHARMD [place] AT 1219 ON 04/16/2020 BY T.SAAD Performed at [facility], [place] [address] [place], [place] [address]    Culture METHICILLIN RESISTANT STAPHYLOCOCCUS AUREUS (A)  Final   Report Status 04/18/2020 FINAL  Final   Organism ID, Bacteria METHICILLIN RESISTANT STAPHYLOCOCCUS AUREUS  Final      Susceptibility   Methicillin resistant staphylococcus aureus - MIC*    CIPROFLOXACIN >=8 RESISTANT Resistant     ERYTHROMYCIN >=8 RESISTANT Resistant     GENTAMICIN <=0.5 SENSITIVE Sensitive     OXACILLIN >=4 RESISTANT Resistant     TETRACYCLINE <=1 SENSITIVE Sensitive     VANCOMYCIN <=0.5 SENSITIVE Sensitive     TRIMETH/SULFA >=320 RESISTANT Resistant     CLINDAMYCIN <=0.25 SENSITIVE Sensitive     RIFAMPIN <=0.5 SENSITIVE Sensitive     Inducible Clindamycin NEGATIVE Sensitive     * METHICILLIN RESISTANT STAPHYLOCOCCUS AUREUS  Blood Culture ID Panel (Reflexed)     Status: Abnormal   Collection Time: 04/15/20  6:09 PM  Result Value Ref Range Status   Enterococcus faecalis NOT DETECTED NOT DETECTED Final   Enterococcus Faecium NOT DETECTED NOT DETECTED Final   Listeria monocytogenes NOT DETECTED NOT DETECTED Final   Staphylococcus species DETECTED (A) NOT DETECTED Final    Comment: CRITICAL RESULT CALLED TO, READ BACK BY AND VERIFIED WITH: PHAMD S.GRUBER AT 1219 ON 04/16/2020 BY T.SAAD.    Staphylococcus aureus (BCID) DETECTED (A) NOT DETECTED Final     Comment: Methicillin (oxacillin)-resistant Staphylococcus aureus (MRSA). MRSA is predictably resistant to beta-lactam antibiotics (except ceftaroline). Preferred therapy is vancomycin unless clinically contraindicated. Patient requires contact precautions if  hospitalized. CRITICAL RESULT CALLED TO, READ BACK BY AND VERIFIED WITH: PHAMD S.GRUBER AT 1219 ON 04/16/2020 BY T.SAAD.    Staphylococcus epidermidis NOT DETECTED NOT DETECTED Final   Staphylococcus lugdunensis NOT DETECTED NOT DETECTED Final   Streptococcus species NOT DETECTED NOT DETECTED Final   Streptococcus agalactiae NOT DETECTED NOT DETECTED Final   Streptococcus pneumoniae NOT DETECTED NOT DETECTED Final   Streptococcus pyogenes NOT DETECTED NOT DETECTED Final   A.calcoaceticus-baumannii NOT DETECTED NOT DETECTED Final   Bacteroides fragilis NOT DETECTED NOT  DETECTED Final   Enterobacterales NOT DETECTED NOT DETECTED Final   Enterobacter cloacae complex NOT DETECTED NOT DETECTED Final   Escherichia coli NOT DETECTED NOT DETECTED Final   Klebsiella aerogenes NOT DETECTED NOT DETECTED Final   Klebsiella oxytoca NOT DETECTED NOT DETECTED Final   Klebsiella pneumoniae NOT DETECTED NOT DETECTED Final   Proteus species NOT DETECTED NOT DETECTED Final   Salmonella species NOT DETECTED NOT DETECTED Final   Serratia marcescens NOT DETECTED NOT DETECTED Final   Haemophilus influenzae NOT DETECTED NOT DETECTED Final   Neisseria meningitidis NOT DETECTED NOT DETECTED Final   Pseudomonas aeruginosa NOT DETECTED NOT DETECTED Final   Stenotrophomonas maltophilia NOT DETECTED NOT DETECTED Final   Candida albicans NOT DETECTED NOT DETECTED Final   Candida auris NOT DETECTED NOT DETECTED Final   Candida glabrata NOT DETECTED NOT DETECTED Final   Candida krusei NOT DETECTED NOT DETECTED Final   Candida parapsilosis NOT DETECTED NOT DETECTED Final   Candida tropicalis NOT DETECTED NOT DETECTED Final   Cryptococcus neoformans/gattii NOT  DETECTED NOT DETECTED Final   Meth resistant mecA/C and MREJ DETECTED (A) NOT DETECTED Final    Comment: CRITICAL RESULT CALLED TO, READ BACK BY AND VERIFIED WITH: PHAMD S.GRUBER AT 1219 ON 04/16/2020 BY T.SAAD. Performed at [facility], [place] [address] [place], [place] [address]   Resp Panel by RT-PCR (Flu A&B, Covid) Nasopharyngeal Swab     Status: None   Collection Time: 04/15/20  6:26 PM   Specimen: Nasopharyngeal Swab; Nasopharyngeal(NP) swabs in vial transport medium  Result Value Ref Range Status   SARS Coronavirus 2 by RT PCR NEGATIVE NEGATIVE Final    Comment: (NOTE) SARS-CoV-2 target nucleic acids are NOT DETECTED.  The SARS-CoV-2 RNA is generally detectable in upper respiratory specimens during the acute phase of infection. The lowest concentration of SARS-CoV-2 viral copies this assay can detect is 138 copies/mL. A negative result does not preclude SARS-Cov-2 infection and should not be used as the sole basis for treatment or other patient management decisions. A negative result may occur with  improper specimen collection/handling, submission of specimen other than nasopharyngeal swab, presence of viral mutation(s) within the areas targeted by this assay, and inadequate number of viral copies(<138 copies/mL). A negative result must be combined with clinical observations, patient history, and epidemiological information. The expected result is Negative.  Fact Sheet for Patients:  EntrepreneurPulse.com.au  Fact Sheet for Healthcare Providers:  IncredibleEmployment.be  This test is no t yet approved or cleared by the [place] FDA and  has been authorized for detection and/or diagnosis of SARS-CoV-2 by FDA under an Emergency Use Authorization (EUA). This EUA will remain  in effect (meaning this test can be used) for the duration of the COVID-19 declaration under Section 564(b)(1) of the Act, 21 U.S.C.section 360bbb-3(b)(1),  unless the authorization is terminated  or revoked sooner.       Influenza A by PCR NEGATIVE NEGATIVE Final   Influenza B by PCR NEGATIVE NEGATIVE Final    Comment: (NOTE) The Xpert Xpress SARS-CoV-2/FLU/RSV plus assay is intended as an aid in the diagnosis of influenza from Nasopharyngeal swab specimens and should not be used as a sole basis for treatment. Nasal washings and aspirates are unacceptable for Xpert Xpress SARS-CoV-2/FLU/RSV testing.  Fact Sheet for Patients: EntrepreneurPulse.com.au  Fact Sheet for Healthcare Providers: IncredibleEmployment.be  This test is not yet approved or cleared by the [place] FDA and has been authorized for detection and/or diagnosis of SARS-CoV-2 by FDA  under an Emergency Use Authorization (EUA). This EUA will remain in effect (meaning this test can be used) for the duration of the COVID-19 declaration under Section 564(b)(1) of the Act, 21 U.S.C. section 360bbb-3(b)(1), unless the authorization is terminated or revoked.  Performed at [facility], [place] [address] [place], [place] [address]   MRSA PCR Screening     Status: None   Collection Time: 04/16/20  8:58 PM   Specimen: Nasopharyngeal  Result Value Ref Range Status   MRSA by PCR NEGATIVE NEGATIVE Final    Comment:        The GeneXpert MRSA Assay (FDA approved for NASAL specimens only), is one component of a comprehensive MRSA colonization surveillance program. It is not intended to diagnose MRSA infection nor to guide or monitor treatment for MRSA infections. Performed at [facility], [place] [address] [place], [place] [address]   Culture, blood (routine x 2)     Status: None   Collection Time: 04/17/20  4:53 AM   Specimen: BLOOD LEFT HAND  Result Value Ref Range Status   Specimen Description BLOOD LEFT HAND  Final   Special Requests   Final    BOTTLES DRAWN AEROBIC AND ANAEROBIC Blood Culture adequate volume    Culture   Final    NO GROWTH 5 DAYS Performed at [facility], [place] [address], [place], [place] [address]    Report Status 04/22/2020 FINAL  Final  Culture, blood (routine x 2)     Status: None   Collection Time: 04/17/20  4:57 AM   Specimen: BLOOD  Result Value Ref Range Status   Specimen Description BLOOD LEFT ANTECUBITAL  Final   Special Requests   Final    BOTTLES DRAWN AEROBIC AND ANAEROBIC Blood Culture adequate volume   Culture   Final    NO GROWTH 5 DAYS Performed at [facility], [place] [address] [place],  [address]    Report Status 04/22/2020 FINAL  Final      Radiology Studies: ECHO TEE  Result Date: 04/21/2020    TRANSESOPHOGEAL ECHO REPORT   Patient Name:   ANGAD NABERS Date of Exam: 04/21/2020 Medical Rec #:  [id number]      Height:       72.0 in Accession #:    [id number]     Weight:       156.5 lb Date of Birth:  September 09, 1955     BSA:          1.920 m Patient Age:    65 years       BP:           112/67 mmHg Patient Gender: M              HR:           81 bpm. Exam Location:  Inpatient Procedure: Transesophageal Echo, Cardiac Doppler and Color Doppler Indications:     Bacteremia  History:         Patient has prior history of Echocardiogram examinations, most                  recent 04/17/2020. Signs/Symptoms:Bacteremia; Risk                  Factors:Current Smoker. Hep. C., substance abuse.  Sonographer:     Dustin Flock Referring Phys:  [phone] JILL D MCDANIEL Diagnosing Phys: Buford Dresser MD PROCEDURE: After discussion of the risks and benefits of a TEE,  an informed consent was obtained from the patient. The transesophogeal probe was passed without difficulty through the esophogus of the patient. Sedation performed by different physician. The patient was monitored while under deep sedation. Anesthestetic sedation was provided intravenously by Anesthesiology: 211mg  of Propofol, 60mg  of Lidocaine. Image quality was good. The patient's vital signs;  including heart rate, blood pressure, and oxygen saturation; remained stable throughout the procedure. The patient developed no complications during the procedure. IMPRESSIONS  1. Left ventricular ejection fraction, by estimation, is 60 to 65%. The left ventricle has normal function. The left ventricle has no regional wall motion abnormalities.  2. Right ventricular systolic function is normal. The right ventricular size is normal.  3. No left atrial/left atrial appendage thrombus was detected.  4. The mitral valve is degenerative. Trivial mitral valve regurgitation.  5. The aortic valve is tricuspid. Aortic valve regurgitation is not visualized. No aortic stenosis is present. Conclusion(s)/Recommendation(s): No evidence of vegetation/infective endocarditis on this transesophageal echocardiogram. FINDINGS  Left Ventricle: Left ventricular ejection fraction, by estimation, is 60 to 65%. The left ventricle has normal function. The left ventricle has no regional wall motion abnormalities. The left ventricular internal cavity size was normal in size. Right Ventricle: The right ventricular size is normal. No increase in right ventricular wall thickness. Right ventricular systolic function is normal. Left Atrium: Left atrial size was normal in size. No left atrial/left atrial appendage thrombus was detected. Right Atrium: Right atrial size was normal in size. Pericardium: Trivial pericardial effusion is present. Mitral Valve: Mildly degenerative at leaflet tips, without evidence of vegetation. The mitral valve is degenerative in appearance. Trivial mitral valve regurgitation. There is no evidence of mitral valve vegetation. Tricuspid Valve: The tricuspid valve is normal in structure. Tricuspid valve regurgitation is mild. There is no evidence of tricuspid valve vegetation. Aortic Valve: The aortic valve is tricuspid. Aortic valve regurgitation is not visualized. No aortic stenosis is present. Aortic valve peak gradient  measures 7.7 mmHg. There is no evidence of aortic valve vegetation. Pulmonic Valve: The pulmonic valve was grossly normal. Pulmonic valve regurgitation is trivial. There is no evidence of pulmonic valve vegetation. Aorta: The aortic root is normal in size and structure. IAS/Shunts: No atrial level shunt detected by color flow Doppler.  AORTIC VALVE AV Vmax:      139.00 cm/s AV Peak Grad: 7.7 mmHg Buford Dresser MD Electronically signed by Buford Dresser MD Signature Date/Time: 04/21/2020/2:06:56 PM    Final     Scheduled Meds: . (feeding supplement) PROSource Plus  30 mL Oral BID BM  . vitamin C  1,000 mg Oral Daily  . Chlorhexidine Gluconate Cloth  6 each Topical Daily  . cyanocobalamin  1,000 mcg Intramuscular Weekly  . docusate sodium  100 mg Oral Daily  . enoxaparin (LOVENOX) injection  40 mg Subcutaneous Q24H  . feeding supplement  237 mL Oral TID BM  . folic acid  1 mg Oral Daily  . hydrOXYzine  50 mg Oral QHS  . levETIRAcetam  500 mg Oral BID  . lithium carbonate  300 mg Oral BID WC  . multivitamin with minerals  1 tablet Oral Daily  . pantoprazole  40 mg Oral Daily  . QUEtiapine  50 mg Oral QHS  . senna  1 tablet Oral BID  . sodium chloride flush  10-40 mL Intracatheter Q12H  . thiamine  250 mg Oral Daily  . zinc sulfate  220 mg Oral Daily   Continuous Infusions: . sodium chloride Stopped (04/20/20 0858)  .  magnesium sulfate bolus IVPB    . vancomycin 1,000 mg (04/21/20 1931)     LOS: 7 days     Phillips Climes, MD [facility]  04/22/2020, 1:13 PM   To contact the attending provider between 7A-7P or the covering provider during after hours 7P-7A, please log into the web site [email or web address].

## 2020-04-22 NOTE — Progress Notes (Signed)
Orthopedic Tech Progress Note Patient Details:  Andrew Chaney 1955/06/20 [id number] Called in order to [facility] for a VIVE SHRINKER Patient ID: Andrew Chaney, male   DOB: 13-Dec-1955, 65 y.o.   MRN: [id number]   Janit Pagan 04/22/2020, 8:46 AM

## 2020-04-22 NOTE — Progress Notes (Signed)
Refused stump shrinker despite education.

## 2020-04-22 NOTE — Progress Notes (Addendum)
OT Cancellation Note  Patient Details Name: Andrew Chaney MRN: [id number] DOB: 02-21-1955   Cancelled Treatment:    Reason Eval/Treat Not Completed: Patient at procedure or test/ unavailable;Other (comment) Pt working with PT upon arrival, will check back as time allows for OT session.  As PT stepped out of room from working with pt, pt noted to roll himself back into bed from recliner. Entered room to encourage pt to stay up in chair. Pt reports he is "too tired" to stay in the chair and needs to sleep. Education provided on importance of staying up in chair to increase activity tolerance for potential CIR admit however pt still declining. Encouraged pt to get up later with nursing. Nutrition services did enter room to drop off food tray with pt agreeable to eat in unsuported long sitting but still would not agree to OOB transfer to chair to eat his lunch.   Will continue efforts as time allows for OT session.   Corinne Ports K., COTA/L Acute Rehabilitation Services [phone]   [name] 04/22/2020, 2:10 PM

## 2020-04-22 NOTE — Progress Notes (Signed)
Pharmacy Antibiotic Note  Andrew Chaney is a 65 y.o. male admitted on 04/15/2020 with several months of generalized body aches and fatigue. He was diagnosed with a left calcaneal fracture several months ago which was concerning for possible osteomyelitis on X-ray. 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 Bcx from 4/7 negative. TTE and TEE negative for vegetation. Pharmacy has been consulted for vancomycin dosing. Patient has been receiving vancomycin 1000mg  q12h with last levels obtained on 4/9. Afebrile. WBC 10.1. Scr stable yesterday. UOP stable.   Plan: Continue Vancomycin 1000mg  IV q12h  Follow up repeating vancomycin level  Monitor renal function  Follow up ID recommendations on length of placement     Height: 6' (182.9 cm) Weight: 71 kg (156 lb 8.4 oz) IBW/kg (Calculated) : 77.6  Temp (24hrs), Avg:98.7 F (37.1 C), Min:97.7 F (36.5 C), Max:99.1 F (37.3 C)  Recent Labs  Lab 04/15/20 1734 04/16/20 0251 04/17/20 0407 04/18/20 0100 04/18/20 2025 04/19/20 0541 04/20/20 0018 04/21/20 0417  WBC  --  17.5* 11.8* 16.2*  --  9.7 10.3 10.1  CREATININE  --  0.87 0.64 0.79  --   --  0.78 0.68  LATICACIDVEN 1.9 2.0* 1.1  --   --   --   --   --   VANCOTROUGH  --   --   --   --   --  12*  --   --   VANCOPEAK  --   --   --   --  25*  --   --   --     Estimated Creatinine Clearance: 96.1 mL/min (by C-G formula based on SCr of 0.68 mg/dL).    No Known Allergies  Antimicrobials this admission: Flagyl 4/5 > 4/7 Cefepime 4/5 > 4/7 Vancomycin 4/5 >>   Microbiology results: 4/5 Bcx: GPC 4/4, staph aureus  4/6: BCID: MRSA 4/7 Bcx: negative  Cristela Felt, PharmD Clinical Pharmacist  04/22/2020 9:59 AM

## 2020-04-22 NOTE — Progress Notes (Signed)
Subjective: No new complaints   Antibiotics:  Anti-infectives (From admission, onward)   Start     Dose/Rate Route Frequency Ordered Stop   04/16/20 0630  vancomycin (VANCOREADY) IVPB 1000 mg/200 mL       "Followed by" Linked Group Details   1,000 mg 200 mL/hr over 60 Minutes Intravenous Every 12 hours 04/15/20 1824     04/16/20 0200  metroNIDAZOLE (FLAGYL) IVPB 500 mg  Status:  Discontinued        500 mg 100 mL/hr over 60 Minutes Intravenous Every 8 hours 04/15/20 1858 04/17/20 0945   04/15/20 1830  ceFEPIme (MAXIPIME) 2 g in sodium chloride 0.9 % 100 mL IVPB  Status:  Discontinued        2 g 200 mL/hr over 30 Minutes Intravenous Every 8 hours 04/15/20 1824 04/17/20 0849   04/15/20 1830  vancomycin (VANCOREADY) IVPB 1500 mg/300 mL       "Followed by" Linked Group Details   1,500 mg 150 mL/hr over 120 Minutes Intravenous  Once 04/15/20 1824 04/16/20 0003   04/15/20 1745  metroNIDAZOLE (FLAGYL) IVPB 500 mg        500 mg 100 mL/hr over 60 Minutes Intravenous  Once 04/15/20 1737 04/15/20 1914      Medications: Scheduled Meds: . (feeding supplement) PROSource Plus  30 mL Oral BID BM  . vitamin C  1,000 mg Oral Daily  . Chlorhexidine Gluconate Cloth  6 each Topical Daily  . cyanocobalamin  1,000 mcg Intramuscular Weekly  . docusate sodium  100 mg Oral Daily  . enoxaparin (LOVENOX) injection  40 mg Subcutaneous Q24H  . feeding supplement  237 mL Oral TID BM  . folic acid  1 mg Oral Daily  . hydrOXYzine  50 mg Oral QHS  . levETIRAcetam  500 mg Oral BID  . lithium carbonate  300 mg Oral BID WC  . multivitamin with minerals  1 tablet Oral Daily  . pantoprazole  40 mg Oral Daily  . QUEtiapine  50 mg Oral QHS  . senna  1 tablet Oral BID  . sodium chloride flush  10-40 mL Intracatheter Q12H  . thiamine  250 mg Oral Daily  . zinc sulfate  220 mg Oral Daily   Continuous Infusions: . sodium chloride Stopped (04/20/20 0858)  . magnesium sulfate bolus IVPB    .  vancomycin 1,000 mg (04/21/20 1931)   PRN Meds:.[DISCONTINUED] acetaminophen **OR** acetaminophen, acetaminophen, albuterol, alum & mag hydroxide-simeth, bisacodyl, guaiFENesin-dextromethorphan, hydrALAZINE, HYDROcodone-acetaminophen, hydrOXYzine, labetalol, magnesium citrate, magnesium sulfate bolus IVPB, melatonin, metoprolol tartrate, ondansetron, oxyCODONE, phenol, sodium chloride flush    Objective: Weight change:   Intake/Output Summary (Last 24 hours) at 04/22/2020 1644 Last data filed at 04/22/2020 1422 Gross per 24 hour  Intake 617.46 ml  Output 4350 ml  Net -3732.54 ml   Blood pressure (!) 180/65, pulse 90, temperature 98.8 F (37.1 C), temperature source Axillary, resp. rate 17, height 6' (1.829 m), weight 71 kg, SpO2 98 %. Temp:  [98.8 F (37.1 C)-98.9 F (37.2 C)] 98.8 F (37.1 C) (04/12 0316) Pulse Rate:  [82-90] 90 (04/12 0316) Resp:  [14-17] 17 (04/12 0316) BP: (106-180)/(59-65) 180/65 (04/12 0316) SpO2:  [92 %-98 %] 98 % (04/12 0316)  Physical Exam: Physical Exam Cardiovascular:     Rate and Rhythm: Tachycardia present.  Pulmonary:     Effort: Pulmonary effort is normal. No respiratory distress.  Abdominal:     General: There is no distension.  Neurological:  General: No focal deficit present.     Mental Status: He is oriented to person, place, and time.    stump site back in the shrink  CBC:    BMET Recent Labs    04/20/20 0018 04/21/20 0417  NA 136 134*  K 4.6 4.4  CL 106 105  CO2 27 27  GLUCOSE 109* 100*  BUN 20 15  CREATININE 0.78 0.68  CALCIUM 8.2* 8.5*     Liver Panel  Recent Labs    04/20/20 0018  PROT 4.7*  ALBUMIN 1.9*  AST 33  ALT 19  ALKPHOS 51  BILITOT 0.6       Sedimentation Rate No results for input(s): ESRSEDRATE in the last 72 hours. C-Reactive Protein No results for input(s): CRP in the last 72 hours.  Micro Results: Recent Results (from the past 720 hour(s))  Blood culture (routine x 2)     Status:  Abnormal   Collection Time: 04/15/20  6:07 PM   Specimen: BLOOD  Result Value Ref Range Status   Specimen Description BLOOD BLOOD LEFT FOREARM  Final   Special Requests   Final    BOTTLES DRAWN AEROBIC AND ANAEROBIC Blood Culture results may not be optimal due to an inadequate volume of blood received in culture bottles   Culture  Setup Time   Final    GRAM POSITIVE COCCI IN BOTH AEROBIC AND ANAEROBIC BOTTLES CRITICAL VALUE NOTED.  VALUE IS CONSISTENT WITH PREVIOUSLY REPORTED AND CALLED VALUE.    Culture (A)  Final    STAPHYLOCOCCUS AUREUS SUSCEPTIBILITIES PERFORMED ON PREVIOUS CULTURE WITHIN THE LAST 5 DAYS. Performed at [facility], [place] [address], [place], [place] [address]    Report Status 04/18/2020 FINAL  Final  Blood culture (routine x 2)     Status: Abnormal   Collection Time: 04/15/20  6:09 PM   Specimen: BLOOD LEFT HAND  Result Value Ref Range Status   Specimen Description BLOOD LEFT HAND  Final   Special Requests   Final    BOTTLES DRAWN AEROBIC AND ANAEROBIC Blood Culture results may not be optimal due to an inadequate volume of blood received in culture bottles   Culture  Setup Time   Final    GRAM POSITIVE COCCI IN BOTH AEROBIC AND ANAEROBIC BOTTLES CRITICAL RESULT CALLED TO, READ BACK BY AND VERIFIED WITH: PHARMD Garfield AT 1219 ON 04/16/2020 BY T.SAAD Performed at [facility], [place] [address], [place], [place] [address]    Culture METHICILLIN RESISTANT STAPHYLOCOCCUS AUREUS (A)  Final   Report Status 04/18/2020 FINAL  Final   Organism ID, Bacteria METHICILLIN RESISTANT STAPHYLOCOCCUS AUREUS  Final      Susceptibility   Methicillin resistant staphylococcus aureus - MIC*    CIPROFLOXACIN >=8 RESISTANT Resistant     ERYTHROMYCIN >=8 RESISTANT Resistant     GENTAMICIN <=0.5 SENSITIVE Sensitive     OXACILLIN >=4 RESISTANT Resistant     TETRACYCLINE <=1 SENSITIVE Sensitive     VANCOMYCIN <=0.5 SENSITIVE Sensitive     TRIMETH/SULFA >=320 RESISTANT  Resistant     CLINDAMYCIN <=0.25 SENSITIVE Sensitive     RIFAMPIN <=0.5 SENSITIVE Sensitive     Inducible Clindamycin NEGATIVE Sensitive     * METHICILLIN RESISTANT STAPHYLOCOCCUS AUREUS  Blood Culture ID Panel (Reflexed)     Status: Abnormal   Collection Time: 04/15/20  6:09 PM  Result Value Ref Range Status   Enterococcus faecalis NOT DETECTED NOT DETECTED Final   Enterococcus Faecium NOT DETECTED NOT  DETECTED Final   Listeria monocytogenes NOT DETECTED NOT DETECTED Final   Staphylococcus species DETECTED (A) NOT DETECTED Final    Comment: CRITICAL RESULT CALLED TO, READ BACK BY AND VERIFIED WITH: PHAMD S.GRUBER AT 1219 ON 04/16/2020 BY T.SAAD.    Staphylococcus aureus (BCID) DETECTED (A) NOT DETECTED Final    Comment: Methicillin (oxacillin)-resistant Staphylococcus aureus (MRSA). MRSA is predictably resistant to beta-lactam antibiotics (except ceftaroline). Preferred therapy is vancomycin unless clinically contraindicated. Patient requires contact precautions if  hospitalized. CRITICAL RESULT CALLED TO, READ BACK BY AND VERIFIED WITH: PHAMD S.GRUBER AT 1219 ON 04/16/2020 BY T.SAAD.    Staphylococcus epidermidis NOT DETECTED NOT DETECTED Final   Staphylococcus lugdunensis NOT DETECTED NOT DETECTED Final   Streptococcus species NOT DETECTED NOT DETECTED Final   Streptococcus agalactiae NOT DETECTED NOT DETECTED Final   Streptococcus pneumoniae NOT DETECTED NOT DETECTED Final   Streptococcus pyogenes NOT DETECTED NOT DETECTED Final   A.calcoaceticus-baumannii NOT DETECTED NOT DETECTED Final   Bacteroides fragilis NOT DETECTED NOT DETECTED Final   Enterobacterales NOT DETECTED NOT DETECTED Final   Enterobacter cloacae complex NOT DETECTED NOT DETECTED Final   Escherichia coli NOT DETECTED NOT DETECTED Final   Klebsiella aerogenes NOT DETECTED NOT DETECTED Final   Klebsiella oxytoca NOT DETECTED NOT DETECTED Final   Klebsiella pneumoniae NOT DETECTED NOT DETECTED Final   Proteus  species NOT DETECTED NOT DETECTED Final   Salmonella species NOT DETECTED NOT DETECTED Final   Serratia marcescens NOT DETECTED NOT DETECTED Final   Haemophilus influenzae NOT DETECTED NOT DETECTED Final   Neisseria meningitidis NOT DETECTED NOT DETECTED Final   Pseudomonas aeruginosa NOT DETECTED NOT DETECTED Final   Stenotrophomonas maltophilia NOT DETECTED NOT DETECTED Final   Candida albicans NOT DETECTED NOT DETECTED Final   Candida auris NOT DETECTED NOT DETECTED Final   Candida glabrata NOT DETECTED NOT DETECTED Final   Candida krusei NOT DETECTED NOT DETECTED Final   Candida parapsilosis NOT DETECTED NOT DETECTED Final   Candida tropicalis NOT DETECTED NOT DETECTED Final   Cryptococcus neoformans/gattii NOT DETECTED NOT DETECTED Final   Meth resistant mecA/C and MREJ DETECTED (A) NOT DETECTED Final    Comment: CRITICAL RESULT CALLED TO, READ BACK BY AND VERIFIED WITH: PHAMD S.GRUBER AT 1219 ON 04/16/2020 BY T.SAAD. Performed at [facility], [place] [address], [place], [place] [address]   Resp Panel by RT-PCR (Flu A&B, Covid) Nasopharyngeal Swab     Status: None   Collection Time: 04/15/20  6:26 PM   Specimen: Nasopharyngeal Swab; Nasopharyngeal(NP) swabs in vial transport medium  Result Value Ref Range Status   SARS Coronavirus 2 by RT PCR NEGATIVE NEGATIVE Final    Comment: (NOTE) SARS-CoV-2 target nucleic acids are NOT DETECTED.  The SARS-CoV-2 RNA is generally detectable in upper respiratory specimens during the acute phase of infection. The lowest concentration of SARS-CoV-2 viral copies this assay can detect is 138 copies/mL. A negative result does not preclude SARS-Cov-2 infection and should not be used as the sole basis for treatment or other patient management decisions. A negative result may occur with  improper specimen collection/handling, submission of specimen other than nasopharyngeal swab, presence of viral mutation(s) within the areas targeted by this  assay, and inadequate number of viral copies(<138 copies/mL). A negative result must be combined with clinical observations, patient history, and epidemiological information. The expected result is Negative.  Fact Sheet for Patients:  EntrepreneurPulse.com.au  Fact Sheet for Healthcare Providers:  IncredibleEmployment.be  This test is no t yet  approved or cleared by the [place] and  has been authorized for detection and/or diagnosis of SARS-CoV-2 by FDA under an Emergency Use Authorization (EUA). This EUA will remain  in effect (meaning this test can be used) for the duration of the COVID-19 declaration under Section 564(b)(1) of the Act, 21 U.S.C.section 360bbb-3(b)(1), unless the authorization is terminated  or revoked sooner.       Influenza A by PCR NEGATIVE NEGATIVE Final   Influenza B by PCR NEGATIVE NEGATIVE Final    Comment: (NOTE) The Xpert Xpress SARS-CoV-2/FLU/RSV plus assay is intended as an aid in the diagnosis of influenza from Nasopharyngeal swab specimens and should not be used as a sole basis for treatment. Nasal washings and aspirates are unacceptable for Xpert Xpress SARS-CoV-2/FLU/RSV testing.  Fact Sheet for Patients: EntrepreneurPulse.com.au  Fact Sheet for Healthcare Providers: IncredibleEmployment.be  This test is not yet approved or cleared by the [place] FDA and has been authorized for detection and/or diagnosis of SARS-CoV-2 by FDA under an Emergency Use Authorization (EUA). This EUA will remain in effect (meaning this test can be used) for the duration of the COVID-19 declaration under Section 564(b)(1) of the Act, 21 U.S.C. section 360bbb-3(b)(1), unless the authorization is terminated or revoked.  Performed at [facility], [place] [address] [place], [place] [address]   MRSA PCR Screening     Status: None   Collection Time: 04/16/20  8:58 PM    Specimen: Nasopharyngeal  Result Value Ref Range Status   MRSA by PCR NEGATIVE NEGATIVE Final    Comment:        The GeneXpert MRSA Assay (FDA approved for NASAL specimens only), is one component of a comprehensive MRSA colonization surveillance program. It is not intended to diagnose MRSA infection nor to guide or monitor treatment for MRSA infections. Performed at [facility], [place] [address] [place], [place] [address]   Culture, blood (routine x 2)     Status: None   Collection Time: 04/17/20  4:53 AM   Specimen: BLOOD LEFT HAND  Result Value Ref Range Status   Specimen Description BLOOD LEFT HAND  Final   Special Requests   Final    BOTTLES DRAWN AEROBIC AND ANAEROBIC Blood Culture adequate volume   Culture   Final    NO GROWTH 5 DAYS Performed at [facility], [place] [address] [place], [place] [address]    Report Status 04/22/2020 FINAL  Final  Culture, blood (routine x 2)     Status: None   Collection Time: 04/17/20  4:57 AM   Specimen: BLOOD  Result Value Ref Range Status   Specimen Description BLOOD LEFT ANTECUBITAL  Final   Special Requests   Final    BOTTLES DRAWN AEROBIC AND ANAEROBIC Blood Culture adequate volume   Culture   Final    NO GROWTH 5 DAYS Performed at [facility], [place] [address] [place], [place] [address]    Report Status 04/22/2020 FINAL  Final    Studies/Results: ECHO TEE  Result Date: 04/21/2020    TRANSESOPHOGEAL ECHO REPORT   Patient Name:   Andrew Chaney Date of Exam: 04/21/2020 Medical Rec #:  [id number]      Height:       72.0 in Accession #:    [id number]     Weight:       156.5 lb Date of Birth:  [birth year]     BSA:  1.920 m Patient Age:    22 years       BP:           112/67 mmHg Patient Gender: M              HR:           81 bpm. Exam Location:  Inpatient Procedure: Transesophageal Echo, Cardiac Doppler and Color Doppler Indications:     Bacteremia  History:         Patient has prior history of Echocardiogram  examinations, most                  recent 04/17/2020. Signs/Symptoms:Bacteremia; Risk                  Factors:Current Smoker. Hep. C., substance abuse.  Sonographer:     Dustin Flock Referring Phys:  [phone] JILL D MCDANIEL Diagnosing Phys: Buford Dresser MD PROCEDURE: After discussion of the risks and benefits of a TEE, an informed consent was obtained from the patient. The transesophogeal probe was passed without difficulty through the esophogus of the patient. Sedation performed by different physician. The patient was monitored while under deep sedation. Anesthestetic sedation was provided intravenously by Anesthesiology: 211mg  of Propofol, 60mg  of Lidocaine. Image quality was good. The patient's vital signs; including heart rate, blood pressure, and oxygen saturation; remained stable throughout the procedure. The patient developed no complications during the procedure. IMPRESSIONS  1. Left ventricular ejection fraction, by estimation, is 60 to 65%. The left ventricle has normal function. The left ventricle has no regional wall motion abnormalities.  2. Right ventricular systolic function is normal. The right ventricular size is normal.  3. No left atrial/left atrial appendage thrombus was detected.  4. The mitral valve is degenerative. Trivial mitral valve regurgitation.  5. The aortic valve is tricuspid. Aortic valve regurgitation is not visualized. No aortic stenosis is present. Conclusion(s)/Recommendation(s): No evidence of vegetation/infective endocarditis on this transesophageal echocardiogram. FINDINGS  Left Ventricle: Left ventricular ejection fraction, by estimation, is 60 to 65%. The left ventricle has normal function. The left ventricle has no regional wall motion abnormalities. The left ventricular internal cavity size was normal in size. Right Ventricle: The right ventricular size is normal. No increase in right ventricular wall thickness. Right ventricular systolic function is normal.  Left Atrium: Left atrial size was normal in size. No left atrial/left atrial appendage thrombus was detected. Right Atrium: Right atrial size was normal in size. Pericardium: Trivial pericardial effusion is present. Mitral Valve: Mildly degenerative at leaflet tips, without evidence of vegetation. The mitral valve is degenerative in appearance. Trivial mitral valve regurgitation. There is no evidence of mitral valve vegetation. Tricuspid Valve: The tricuspid valve is normal in structure. Tricuspid valve regurgitation is mild. There is no evidence of tricuspid valve vegetation. Aortic Valve: The aortic valve is tricuspid. Aortic valve regurgitation is not visualized. No aortic stenosis is present. Aortic valve peak gradient measures 7.7 mmHg. There is no evidence of aortic valve vegetation. Pulmonic Valve: The pulmonic valve was grossly normal. Pulmonic valve regurgitation is trivial. There is no evidence of pulmonic valve vegetation. Aorta: The aortic root is normal in size and structure. IAS/Shunts: No atrial level shunt detected by color flow Doppler.  AORTIC VALVE AV Vmax:      139.00 cm/s AV Peak Grad: 7.7 mmHg Buford Dresser MD Electronically signed by Buford Dresser MD Signature Date/Time: 04/21/2020/2:06:56 PM    Final       Assessment/Plan:  INTERVAL HISTORY: pt  took off device last night from amputation site   Active Problems:   Hyponatremia   Chronic hepatitis C (HCC)   Bipolar disorder, most recent episode manic (HCC)   Subacute osteomyelitis, left ankle and foot (HCC)   Septic shock (HCC)   Cutaneous abscess of left foot    Andrew Chaney is a 65 y.o. male with hx of IVDU, homelessness, HCV MRSA bacteremia secondary to abscess and osteomyelitis of left ankle, status post BKA.  TEE does not show evidence of vegetation   MRSA bacteremia:  Continue vancomycin   Would like to complete least 2 weeks of systemic antibiotics in the hospital.   Sp BKA:  I would think  he is going to need placement somewhere to heal up his BKA site. Would not be appropriate to dc to shelter  HCV:   Can rx as an outpatient.   LOS: 7 days   Andrew Chaney 04/22/2020, 4:44 PM

## 2020-04-22 NOTE — Progress Notes (Signed)
Pt has been uncooperative and aggressive this shift. Pt continues to pull telemetry leads off, pulled out IV and has taken off ortho brace. MD aware and came to beside to speak with pt. Seroquel ordered. Pt refused medication. Pt has been educated on risk and benefits of need for an IV for abx, tele monitoring and the need for his ortho brace. Pt states understanding but still refuses. Pt states "I just wants the tube down his throat". Pt state " I'm going to come back and kill y'all" Will continue to monitor.

## 2020-04-23 ENCOUNTER — Inpatient Hospital Stay: Payer: Self-pay

## 2020-04-23 LAB — COMPREHENSIVE METABOLIC PANEL
ALT: 24 U/L (ref 0–44)
AST: 29 U/L (ref 15–41)
Albumin: 2.3 g/dL — ABNORMAL LOW (ref 3.5–5.0)
Alkaline Phosphatase: 48 U/L (ref 38–126)
Anion gap: 5 (ref 5–15)
BUN: 15 mg/dL (ref 8–23)
CO2: 26 mmol/L (ref 22–32)
Calcium: 8.5 mg/dL — ABNORMAL LOW (ref 8.9–10.3)
Chloride: 105 mmol/L (ref 98–111)
Creatinine, Ser: 0.78 mg/dL (ref 0.61–1.24)
GFR, Estimated: >60 mL/min (ref >60)
Glucose, Bld: 93 mg/dL (ref 70–99)
Potassium: 4.2 mmol/L (ref 3.5–5.1)
Sodium: 136 mmol/L (ref 135–145)
Total Bilirubin: 0.8 mg/dL (ref 0.3–1.2)
Total Protein: 5.1 g/dL — ABNORMAL LOW (ref 6.5–8.1)

## 2020-04-23 LAB — CBC
HCT: 32.7 % — ABNORMAL LOW (ref 39.0–52.0)
Hemoglobin: 10.7 g/dL — ABNORMAL LOW (ref 13.0–17.0)
MCH: 31.7 pg (ref 26.0–34.0)
MCHC: 32.7 g/dL (ref 30.0–36.0)
MCV: 96.7 fL (ref 80.0–100.0)
Platelets: 506 10*3/uL — ABNORMAL HIGH (ref 150–400)
RBC: 3.38 MIL/uL — ABNORMAL LOW (ref 4.22–5.81)
RDW: 13.9 % (ref 11.5–15.5)
WBC: 9.5 10*3/uL (ref 4.0–10.5)
nRBC: 0 % (ref 0.0–0.2)

## 2020-04-23 LAB — GLUCOSE, CAPILLARY
Glucose-Capillary: 92 mg/dL (ref 70–99)
Glucose-Capillary: 117 mg/dL — ABNORMAL HIGH (ref 70–99)

## 2020-04-23 MED ORDER — LINEZOLID 600 MG PO TABS
600.0000 mg | ORAL_TABLET | Freq: Two times a day (BID) | ORAL | Status: DC
  Administered 2020-04-23: 600 mg via ORAL
  Filled 2020-04-23 (×2): qty 1

## 2020-04-23 MED ORDER — SODIUM CHLORIDE 0.9% FLUSH: 10.0000 mL | INTRAVENOUS | Status: DC | PRN

## 2020-04-23 NOTE — Progress Notes (Signed)
Peripherally Inserted Central Catheter Placement  The IV Nurse has discussed with the patient and/or persons authorized to consent for the patient, the purpose of this procedure and the potential benefits and risks involved with this procedure.  The benefits include less needle sticks, lab draws from the catheter, and the patient may be discharged home with the catheter. Risks include, but not limited to, infection, bleeding, blood clot (thrombus formation), and puncture of an artery; nerve damage and irregular heartbeat and possibility to perform a PICC exchange if needed/ordered by physician.  Alternatives to this procedure were also discussed.  Bard Power PICC patient education guide, fact sheet on infection prevention and patient information card has been provided to patient /or left at bedside.    PICC Placement Documentation  PICC Single Lumen 85/46/27 Right Basilic 40 cm 0 cm (Active)  Indication for Insertion or Continuance of Line Prolonged intravenous therapies 04/23/20 1643  Exposed Catheter (cm) 0 cm 04/23/20 1643  Site Assessment Clean;Intact;Dry 04/23/20 1643  Line Status Flushed;Blood return noted 04/23/20 1643  Dressing Type Transparent 04/23/20 1643  Dressing Status Clean;Dry;Intact 04/23/20 1643  Antimicrobial disc in place? Yes 04/23/20 1643  Dressing Intervention New dressing;Other (Comment) 04/23/20 1643  Dressing Change Due 04/30/20 04/23/20 1643       Christella Noa Albarece 04/23/2020, 4:44 PM

## 2020-04-23 NOTE — Progress Notes (Signed)
PT Cancellation Note  Patient Details Name: Andrew Chaney MRN: [id number] DOB: November 02, 1955   Cancelled Treatment:    Reason Eval/Treat Not Completed: Patient declined, no reason specified. Pt adamantly refusing this PM as he did with OT earlier. Will continue attempts. Updated dc recommendations to SNF. Realize SNF may not be possible. Will continue to work with pt to maximize independence at w/c level.    [name] [facility] 04/23/2020, 3:34 PM [name] Pager [phone] Office [phone]

## 2020-04-23 NOTE — Progress Notes (Signed)
PROGRESS NOTE        PATIENT DETAILS Name: Andrew Chaney Age: 65 y.o. Sex: male Date of Birth: 03-28-1955 Admit Date: 04/15/2020 Admitting Physician [name], MD BPZ:WCHENID, No Pcp Per (Inactive)  Brief Narrative: Patient is a 65 y.o. male with history of bipolar disorder, MSSA bacteremia, chronic hepatitis C-who was diagnosed with left calcaneal fracture several months ago-homelessness-who was brought to the ED by [employer]-he was found to have osteomyelitis of his left foot-with septic shock-he was managed in the ICU by PCCM-upon stability-transfer to [facility].  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  See below for further details.  Significant events: 4/5>> admit for left foot osteomyelitis 4/6>> septic shock-staph aureus bacteremia-PCCM consulted 4/6>> left BKA 4/11>> TEE  Significant studies: 4/5>> x-ray left foot: Comminuted fracture of the posterior calcaneus-osteomyelitis. 4/5>> chest x-ray: Right base atelectasis 4/7>> TTE: EF 60-65% 4/11>> TEE: No vegetations, EF 60-65%  Antimicrobial therapy: Vancomycin: 4/5>> Flagyl: 4/5>> 4/6 Cefepime: 4/5>> 4/6  Microbiology data: 4/5>> blood culture: MRSA 4/7>> blood culture: No growth  Procedures : 4/6>> left BKA (Dr. Sharol Given) 4/11>> TEE: No vegetations, EF 60-65%  Consults: PCCM, orthopedics, infectious disease, psychiatry  DVT Prophylaxis : enoxaparin (LOVENOX) injection 40 mg Start: 04/17/20 1400 SCDs Start: 04/16/20 1249   Subjective: Lying comfortably in bed-denies any chest pain or shortness of breath.   Assessment/Plan: Septic shock due to MRSA bacteremia/left foot osteomyelitis with abscess involving his left foot: Sepsis physiology has resolved-remains on IV vancomycin-patient is now s/p left BKA.  TEE negative for vegetations.  Needs 2 weeks of antibiotics from date of negative blood cultures.  Right hip pressure ulcer: Due to slipping on concrete-patient home  meds-continue wound care.  Bipolar disorder: Previously noncompliant with medications-evaluated by psychiatry-started on lithium  Seizure disorder: Continue Keppra  Vitamin B12 deficiency: Continue supplementation  Hyponatremia: Resolved  Normocytic anemia: Due to acute illness-chronic osteomyelitis-follow CBC periodically  Chronic hepatitis C: Per ID-plans are for outpatient treatment.  Nutrition Problem: Nutrition Problem: Increased nutrient needs Etiology: wound healing Signs/Symptoms: estimated needs Interventions: Ensure Enlive (each supplement provides 350kcal and 20 grams of protein),Prostat,MVI   Diet: Diet Order            Diet regular Room service appropriate? Yes; Fluid consistency: Thin  Diet effective now                  Code Status: Full code   Family Communication: None at bedside   Disposition Plan: Status is: Inpatient  Remains inpatient appropriate because:Inpatient level of care appropriate due to severity of illness   Dispo: The patient is from: Home              Anticipated d/c is to: TBD              Patient currently is not medically stable to d/c.   Difficult to place patient Yes   Barriers to Discharge: MRSA bacteremia-on IV vancomycin-requires SNF placement  Antimicrobial agents: Anti-infectives (From admission, onward)   Start     Dose/Rate Route Frequency Ordered Stop   04/23/20 0130  linezolid (ZYVOX) tablet 600 mg  Status:  Discontinued        600 mg Oral Every 12 hours 04/23/20 0039 04/23/20 0854   04/16/20 0630  vancomycin (VANCOREADY) IVPB 1000 mg/200 mL       "Followed by" Linked Group  Details   1,000 mg 200 mL/hr over 60 Minutes Intravenous Every 12 hours 04/15/20 1824     04/16/20 0200  metroNIDAZOLE (FLAGYL) IVPB 500 mg  Status:  Discontinued        500 mg 100 mL/hr over 60 Minutes Intravenous Every 8 hours 04/15/20 1858 04/17/20 0945   04/15/20 1830  ceFEPIme (MAXIPIME) 2 g in sodium chloride 0.9 % 100 mL IVPB   Status:  Discontinued        2 g 200 mL/hr over 30 Minutes Intravenous Every 8 hours 04/15/20 1824 04/17/20 0849   04/15/20 1830  vancomycin (VANCOREADY) IVPB 1500 mg/300 mL       "Followed by" Linked Group Details   1,500 mg 150 mL/hr over 120 Minutes Intravenous  Once 04/15/20 1824 04/16/20 0003   04/15/20 1745  metroNIDAZOLE (FLAGYL) IVPB 500 mg        500 mg 100 mL/hr over 60 Minutes Intravenous  Once 04/15/20 1737 04/15/20 1914       Time spent: 25- minutes-Greater than 50% of this time was spent in counseling, explanation of diagnosis, planning of further management, and coordination of care.  MEDICATIONS: Scheduled Meds: . (feeding supplement) PROSource Plus  30 mL Oral BID BM  . vitamin C  1,000 mg Oral Daily  . Chlorhexidine Gluconate Cloth  6 each Topical Daily  . cyanocobalamin  1,000 mcg Intramuscular Weekly  . docusate sodium  100 mg Oral Daily  . enoxaparin (LOVENOX) injection  40 mg Subcutaneous Q24H  . feeding supplement  237 mL Oral TID BM  . folic acid  1 mg Oral Daily  . hydrOXYzine  50 mg Oral QHS  . levETIRAcetam  500 mg Oral BID  . lithium carbonate  300 mg Oral BID WC  . multivitamin with minerals  1 tablet Oral Daily  . pantoprazole  40 mg Oral Daily  . QUEtiapine  50 mg Oral QHS  . senna  1 tablet Oral BID  . sodium chloride flush  10-40 mL Intracatheter Q12H  . thiamine  250 mg Oral Daily  . zinc sulfate  220 mg Oral Daily   Continuous Infusions: . sodium chloride Stopped (04/20/20 0858)  . magnesium sulfate bolus IVPB    . vancomycin 1,000 mg (04/22/20 1830)   PRN Meds:.[DISCONTINUED] acetaminophen **OR** acetaminophen, acetaminophen, albuterol, alum & mag hydroxide-simeth, bisacodyl, guaiFENesin-dextromethorphan, hydrALAZINE, HYDROcodone-acetaminophen, hydrOXYzine, labetalol, magnesium citrate, magnesium sulfate bolus IVPB, melatonin, metoprolol tartrate, ondansetron, oxyCODONE, phenol, sodium chloride flush   PHYSICAL EXAM: Vital  signs: Vitals:   04/22/20 2000 04/22/20 2344 04/23/20 0424 04/23/20 1207  BP: 130/82 134/80 136/85 107/68  Pulse: 90 92 90 78  Resp: 18 20 (!) 21 20  Temp: 98.7 F (37.1 C) 98.8 F (37.1 C) 98.7 F (37.1 C) 98.1 F (36.7 C)  TempSrc: Axillary Axillary Axillary Oral  SpO2: 99% 98% 98%   Weight:      Height:       Filed Weights   04/15/20 1800  Weight: 71 kg   Body mass index is 21.23 kg/m.   Gen Exam:Alert awake-not in any distress HEENT:atraumatic, normocephalic Chest: B/L clear to auscultation anteriorly CVS:S1S2 regular Abdomen:soft non tender, non distended Extremities: Left BKA-site appears intact. Neurology: Non focal Skin: no rash  I have personally reviewed following labs and imaging studies  LABORATORY DATA: CBC: Recent Labs  Lab 04/18/20 0100 04/19/20 0541 04/20/20 0018 04/21/20 0417 04/23/20 0244  WBC 16.2* 9.7 10.3 10.1 9.5  HGB 10.3* 10.5* 11.1* 10.6* 10.7*  HCT  30.9* 31.7* 34.3* 31.6* 32.7*  MCV 96.9 96.9 99.4 97.2 96.7  PLT 357 401* 424* 459* 506*    Basic Metabolic Panel: Recent Labs  Lab 04/17/20 0407 04/17/20 0829 04/18/20 0100 04/20/20 0018 04/21/20 0417 04/23/20 0244  NA 136  --  137 136 134* 136  K 4.2  --  4.6 4.6 4.4 4.2  CL 110  --  110 106 105 105  CO2 21*  --  23 27 27 26   GLUCOSE 158*  --  170* 109* 100* 93  BUN 16  --  21 20 15 15   CREATININE 0.64  --  0.79 0.78 0.68 0.78  CALCIUM 7.7*  --  8.0* 8.2* 8.5* 8.5*  MG  --  2.0  --   --  2.0  --   PHOS  --  2.5  --   --  3.4  --     GFR: Estimated Creatinine Clearance: 96.1 mL/min (by C-G formula based on SCr of 0.78 mg/dL).  Liver Function Tests: Recent Labs  Lab 04/20/20 0018 04/23/20 0244  AST 33 29  ALT 19 24  ALKPHOS 51 48  BILITOT 0.6 0.8  PROT 4.7* 5.1*  ALBUMIN 1.9* 2.3*   No results for input(s): LIPASE, AMYLASE in the last 168 hours. No results for input(s): AMMONIA in the last 168 hours.  Coagulation Profile: No results for input(s): INR,  PROTIME in the last 168 hours.  Cardiac Enzymes: No results for input(s): CKTOTAL, CKMB, CKMBINDEX, TROPONINI in the last 168 hours.  BNP (last 3 results) No results for input(s): PROBNP in the last 8760 hours.  Lipid Profile: No results for input(s): CHOL, HDL, LDLCALC, TRIG, CHOLHDL, LDLDIRECT in the last 72 hours.  Thyroid Function Tests: No results for input(s): TSH, T4TOTAL, FREET4, T3FREE, THYROIDAB in the last 72 hours.  Anemia Panel: No results for input(s): VITAMINB12, FOLATE, FERRITIN, TIBC, IRON, RETICCTPCT in the last 72 hours.  Urine analysis:    Component Value Date/Time   COLORURINE AMBER (A) 04/16/2020 0103   APPEARANCEUR CLEAR 04/16/2020 0103   LABSPEC 1.031 (H) 04/16/2020 0103   PHURINE 5.0 04/16/2020 0103   GLUCOSEU NEGATIVE 04/16/2020 0103   HGBUR NEGATIVE 04/16/2020 0103   BILIRUBINUR NEGATIVE 04/16/2020 0103   KETONESUR NEGATIVE 04/16/2020 0103   PROTEINUR 30 (A) 04/16/2020 0103   UROBILINOGEN 0.2 03/13/2013 1933   NITRITE NEGATIVE 04/16/2020 0103   LEUKOCYTESUR NEGATIVE 04/16/2020 0103    Sepsis Labs: Lactic Acid, Venous    Component Value Date/Time   LATICACIDVEN 1.1 04/17/2020 0407    MICROBIOLOGY: Recent Results (from the past 240 hour(s))  Blood culture (routine x 2)     Status: Abnormal   Collection Time: 04/15/20  6:07 PM   Specimen: BLOOD  Result Value Ref Range Status   Specimen Description BLOOD BLOOD LEFT FOREARM  Final   Special Requests   Final    BOTTLES DRAWN AEROBIC AND ANAEROBIC Blood Culture results may not be optimal due to an inadequate volume of blood received in culture bottles   Culture  Setup Time   Final    GRAM POSITIVE COCCI IN BOTH AEROBIC AND ANAEROBIC BOTTLES CRITICAL VALUE NOTED.  VALUE IS CONSISTENT WITH PREVIOUSLY REPORTED AND CALLED VALUE.    Culture (A)  Final    STAPHYLOCOCCUS AUREUS SUSCEPTIBILITIES PERFORMED ON PREVIOUS CULTURE WITHIN THE LAST 5 DAYS. Performed at [facility], [place] [address] [place], [place] [address]    Report Status 04/18/2020 FINAL  Final  Blood culture (  routine x 2)     Status: Abnormal   Collection Time: 04/15/20  6:09 PM   Specimen: BLOOD LEFT HAND  Result Value Ref Range Status   Specimen Description BLOOD LEFT HAND  Final   Special Requests   Final    BOTTLES DRAWN AEROBIC AND ANAEROBIC Blood Culture results may not be optimal due to an inadequate volume of blood received in culture bottles   Culture  Setup Time   Final    GRAM POSITIVE COCCI IN BOTH AEROBIC AND ANAEROBIC BOTTLES CRITICAL RESULT CALLED TO, READ BACK BY AND VERIFIED WITH: PHARMD Atkins AT 1219 ON 04/16/2020 BY T.SAAD Performed at [facility], [place] [address], [place], [place] [address]    Culture METHICILLIN RESISTANT STAPHYLOCOCCUS AUREUS (A)  Final   Report Status 04/18/2020 FINAL  Final   Organism ID, Bacteria METHICILLIN RESISTANT STAPHYLOCOCCUS AUREUS  Final      Susceptibility   Methicillin resistant staphylococcus aureus - MIC*    CIPROFLOXACIN >=8 RESISTANT Resistant     ERYTHROMYCIN >=8 RESISTANT Resistant     GENTAMICIN <=0.5 SENSITIVE Sensitive     OXACILLIN >=4 RESISTANT Resistant     TETRACYCLINE <=1 SENSITIVE Sensitive     VANCOMYCIN <=0.5 SENSITIVE Sensitive     TRIMETH/SULFA >=320 RESISTANT Resistant     CLINDAMYCIN <=0.25 SENSITIVE Sensitive     RIFAMPIN <=0.5 SENSITIVE Sensitive     Inducible Clindamycin NEGATIVE Sensitive     * METHICILLIN RESISTANT STAPHYLOCOCCUS AUREUS  Blood Culture ID Panel (Reflexed)     Status: Abnormal   Collection Time: 04/15/20  6:09 PM  Result Value Ref Range Status   Enterococcus faecalis NOT DETECTED NOT DETECTED Final   Enterococcus Faecium NOT DETECTED NOT DETECTED Final   Listeria monocytogenes NOT DETECTED NOT DETECTED Final   Staphylococcus species DETECTED (A) NOT DETECTED Final    Comment: CRITICAL RESULT CALLED TO, READ BACK BY AND VERIFIED WITH: PHAMD S.GRUBER AT 1219 ON 04/16/2020 BY T.SAAD.     Staphylococcus aureus (BCID) DETECTED (A) NOT DETECTED Final    Comment: Methicillin (oxacillin)-resistant Staphylococcus aureus (MRSA). MRSA is predictably resistant to beta-lactam antibiotics (except ceftaroline). Preferred therapy is vancomycin unless clinically contraindicated. Patient requires contact precautions if  hospitalized. CRITICAL RESULT CALLED TO, READ BACK BY AND VERIFIED WITH: PHAMD S.GRUBER AT 1219 ON 04/16/2020 BY T.SAAD.    Staphylococcus epidermidis NOT DETECTED NOT DETECTED Final   Staphylococcus lugdunensis NOT DETECTED NOT DETECTED Final   Streptococcus species NOT DETECTED NOT DETECTED Final   Streptococcus agalactiae NOT DETECTED NOT DETECTED Final   Streptococcus pneumoniae NOT DETECTED NOT DETECTED Final   Streptococcus pyogenes NOT DETECTED NOT DETECTED Final   A.calcoaceticus-baumannii NOT DETECTED NOT DETECTED Final   Bacteroides fragilis NOT DETECTED NOT DETECTED Final   Enterobacterales NOT DETECTED NOT DETECTED Final   Enterobacter cloacae complex NOT DETECTED NOT DETECTED Final   Escherichia coli NOT DETECTED NOT DETECTED Final   Klebsiella aerogenes NOT DETECTED NOT DETECTED Final   Klebsiella oxytoca NOT DETECTED NOT DETECTED Final   Klebsiella pneumoniae NOT DETECTED NOT DETECTED Final   Proteus species NOT DETECTED NOT DETECTED Final   Salmonella species NOT DETECTED NOT DETECTED Final   Serratia marcescens NOT DETECTED NOT DETECTED Final   Haemophilus influenzae NOT DETECTED NOT DETECTED Final   Neisseria meningitidis NOT DETECTED NOT DETECTED Final   Pseudomonas aeruginosa NOT DETECTED NOT DETECTED Final   Stenotrophomonas maltophilia NOT DETECTED NOT DETECTED Final   Candida albicans NOT DETECTED NOT DETECTED Final  Candida auris NOT DETECTED NOT DETECTED Final   Candida glabrata NOT DETECTED NOT DETECTED Final   Candida krusei NOT DETECTED NOT DETECTED Final   Candida parapsilosis NOT DETECTED NOT DETECTED Final   Candida tropicalis NOT  DETECTED NOT DETECTED Final   Cryptococcus neoformans/gattii NOT DETECTED NOT DETECTED Final   Meth resistant mecA/C and MREJ DETECTED (A) NOT DETECTED Final    Comment: CRITICAL RESULT CALLED TO, READ BACK BY AND VERIFIED WITH: PHAMD S.GRUBER AT 1219 ON 04/16/2020 BY T.SAAD. Performed at [facility], [place] [address], [place], [place] [address]   Resp Panel by RT-PCR (Flu A&B, Covid) Nasopharyngeal Swab     Status: None   Collection Time: 04/15/20  6:26 PM   Specimen: Nasopharyngeal Swab; Nasopharyngeal(NP) swabs in vial transport medium  Result Value Ref Range Status   SARS Coronavirus 2 by RT PCR NEGATIVE NEGATIVE Final    Comment: (NOTE) SARS-CoV-2 target nucleic acids are NOT DETECTED.  The SARS-CoV-2 RNA is generally detectable in upper respiratory specimens during the acute phase of infection. The lowest concentration of SARS-CoV-2 viral copies this assay can detect is 138 copies/mL. A negative result does not preclude SARS-Cov-2 infection and should not be used as the sole basis for treatment or other patient management decisions. A negative result may occur with  improper specimen collection/handling, submission of specimen other than nasopharyngeal swab, presence of viral mutation(s) within the areas targeted by this assay, and inadequate number of viral copies(<138 copies/mL). A negative result must be combined with clinical observations, patient history, and epidemiological information. The expected result is Negative.  Fact Sheet for Patients:  EntrepreneurPulse.com.au  Fact Sheet for Healthcare Providers:  IncredibleEmployment.be  This test is no t yet approved or cleared by the [place] FDA and  has been authorized for detection and/or diagnosis of SARS-CoV-2 by FDA under an Emergency Use Authorization (EUA). This EUA will remain  in effect (meaning this test can be used) for the duration of the COVID-19 declaration  under Section 564(b)(1) of the Act, 21 U.S.C.section 360bbb-3(b)(1), unless the authorization is terminated  or revoked sooner.       Influenza A by PCR NEGATIVE NEGATIVE Final   Influenza B by PCR NEGATIVE NEGATIVE Final    Comment: (NOTE) The Xpert Xpress SARS-CoV-2/FLU/RSV plus assay is intended as an aid in the diagnosis of influenza from Nasopharyngeal swab specimens and should not be used as a sole basis for treatment. Nasal washings and aspirates are unacceptable for Xpert Xpress SARS-CoV-2/FLU/RSV testing.  Fact Sheet for Patients: EntrepreneurPulse.com.au  Fact Sheet for Healthcare Providers: IncredibleEmployment.be  This test is not yet approved or cleared by the [place] FDA and has been authorized for detection and/or diagnosis of SARS-CoV-2 by FDA under an Emergency Use Authorization (EUA). This EUA will remain in effect (meaning this test can be used) for the duration of the COVID-19 declaration under Section 564(b)(1) of the Act, 21 U.S.C. section 360bbb-3(b)(1), unless the authorization is terminated or revoked.  Performed at [facility], [place] [address], [place], [place] [address]   MRSA PCR Screening     Status: None   Collection Time: 04/16/20  8:58 PM   Specimen: Nasopharyngeal  Result Value Ref Range Status   MRSA by PCR NEGATIVE NEGATIVE Final    Comment:        The GeneXpert MRSA Assay (FDA approved for NASAL specimens only), is one component of a comprehensive MRSA colonization surveillance program. It is not intended to diagnose  MRSA infection nor to guide or monitor treatment for MRSA infections. Performed at [facility], [place] [address], [place], [place] [address]   Culture, blood (routine x 2)     Status: None   Collection Time: 04/17/20  4:53 AM   Specimen: BLOOD LEFT HAND  Result Value Ref Range Status   Specimen Description BLOOD LEFT HAND  Final   Special Requests   Final     BOTTLES DRAWN AEROBIC AND ANAEROBIC Blood Culture adequate volume   Culture   Final    NO GROWTH 5 DAYS Performed at [facility], [place] [address], [place], [place] [address]    Report Status 04/22/2020 FINAL  Final  Culture, blood (routine x 2)     Status: None   Collection Time: 04/17/20  4:57 AM   Specimen: BLOOD  Result Value Ref Range Status   Specimen Description BLOOD LEFT ANTECUBITAL  Final   Special Requests   Final    BOTTLES DRAWN AEROBIC AND ANAEROBIC Blood Culture adequate volume   Culture   Final    NO GROWTH 5 DAYS Performed at [facility], [place] [address], [place], [place] [address]    Report Status 04/22/2020 FINAL  Final    RADIOLOGY STUDIES/RESULTS: No results found.   LOS: 8 days   Oren Binet, MD  [facility]    To contact the attending provider between 7A-7P or the covering provider during after hours 7P-7A, please log into the web site [email or web address] and access using universal [place] password for that web site. If you do not have the password, please call the hospital operator.  04/23/2020, 12:17 PM

## 2020-04-23 NOTE — Progress Notes (Signed)
Okay with Dr. Tommy Medal, C  to place the PICC line.

## 2020-04-23 NOTE — Progress Notes (Signed)
OT Cancellation Note  Patient Details Name: Andrew Chaney MRN: [id number] DOB: Mar 30, 1955   Cancelled Treatment:    Reason Eval/Treat Not Completed: Patient declined, no reason specified On entry, pt in fetal position in bed. As therapist introduced self, pt reports repeatedly "no, thank you. Im ok". Educated on purpose of therapy to maximize independence for safe DC though pt declined again, citing headache and requesting therapist to return later. Will follow-up again as able.   Layla Maw 04/23/2020, 10:46 AM

## 2020-04-24 DIAGNOSIS — M79672 Pain in left foot: Secondary | ICD-10-CM

## 2020-04-24 DIAGNOSIS — F1990 Other psychoactive substance use, unspecified, uncomplicated: Secondary | ICD-10-CM

## 2020-04-24 NOTE — Progress Notes (Signed)
Patient has pulled out PICC line, PICC line intact. RN Held pressure to site upon discovering and vaseline gauze applied along with tegaderm dressing to site. Patient states he removed the PICC line because he "can't get comfortable". This RN educated patient on important of PICC line and dangers of removing line without RN supervision. Patient verbalizes understanding but seems disinterested in teaching. Patient currentlu denies having any complaints, vitals WNL. Spoke with Dr. Tonie Griffith, MD notified of patient removing PICC line and patient status. MD verbalizes understanding. IV team at bedside at this time to start new IV. Will continue to monitor.

## 2020-04-24 NOTE — Progress Notes (Addendum)
Subjective: No new complaints   Antibiotics:  Anti-infectives (From admission, onward)   Start     Dose/Rate Route Frequency Ordered Stop   04/23/20 0130  linezolid (ZYVOX) tablet 600 mg  Status:  Discontinued        600 mg Oral Every 12 hours 04/23/20 0039 04/23/20 0854   04/16/20 0630  vancomycin (VANCOREADY) IVPB 1000 mg/200 mL       "Followed by" Linked Group Details   1,000 mg 200 mL/hr over 60 Minutes Intravenous Every 12 hours 04/15/20 1824     04/16/20 0200  metroNIDAZOLE (FLAGYL) IVPB 500 mg  Status:  Discontinued        500 mg 100 mL/hr over 60 Minutes Intravenous Every 8 hours 04/15/20 1858 04/17/20 0945   04/15/20 1830  ceFEPIme (MAXIPIME) 2 g in sodium chloride 0.9 % 100 mL IVPB  Status:  Discontinued        2 g 200 mL/hr over 30 Minutes Intravenous Every 8 hours 04/15/20 1824 04/17/20 0849   04/15/20 1830  vancomycin (VANCOREADY) IVPB 1500 mg/300 mL       "Followed by" Linked Group Details   1,500 mg 150 mL/hr over 120 Minutes Intravenous  Once 04/15/20 1824 04/16/20 0003   04/15/20 1745  metroNIDAZOLE (FLAGYL) IVPB 500 mg        500 mg 100 mL/hr over 60 Minutes Intravenous  Once 04/15/20 1737 04/15/20 1914      Medications: Scheduled Meds: . (feeding supplement) PROSource Plus  30 mL Oral BID BM  . vitamin C  1,000 mg Oral Daily  . Chlorhexidine Gluconate Cloth  6 each Topical Daily  . cyanocobalamin  1,000 mcg Intramuscular Weekly  . docusate sodium  100 mg Oral Daily  . enoxaparin (LOVENOX) injection  40 mg Subcutaneous Q24H  . feeding supplement  237 mL Oral TID BM  . folic acid  1 mg Oral Daily  . hydrOXYzine  50 mg Oral QHS  . levETIRAcetam  500 mg Oral BID  . lithium carbonate  300 mg Oral BID WC  . multivitamin with minerals  1 tablet Oral Daily  . pantoprazole  40 mg Oral Daily  . senna  1 tablet Oral BID  . sodium chloride flush  10-40 mL Intracatheter Q12H  . thiamine  250 mg Oral Daily  . zinc sulfate  220 mg Oral Daily    Continuous Infusions: . sodium chloride Stopped (04/20/20 0858)  . magnesium sulfate bolus IVPB    . vancomycin 1,000 mg (04/24/20 0637)   PRN Meds:.[DISCONTINUED] acetaminophen **OR** acetaminophen, acetaminophen, albuterol, alum & mag hydroxide-simeth, bisacodyl, guaiFENesin-dextromethorphan, hydrALAZINE, HYDROcodone-acetaminophen, hydrOXYzine, labetalol, magnesium citrate, magnesium sulfate bolus IVPB, melatonin, metoprolol tartrate, ondansetron, oxyCODONE, phenol, sodium chloride flush, sodium chloride flush    Objective: Weight change:   Intake/Output Summary (Last 24 hours) at 04/24/2020 1519 Last data filed at 04/24/2020 0500 Gross per 24 hour  Intake 240 ml  Output 1460 ml  Net -1220 ml   Blood pressure (!) 93/54, pulse 84, temperature 98.2 F (36.8 C), temperature source Axillary, resp. rate 18, height 6' (1.829 m), weight 71 kg, SpO2 94 %. Temp:  [97.9 F (36.6 C)-98.4 F (36.9 C)] 98.2 F (36.8 C) (04/14 1241) Pulse Rate:  [78-84] 84 (04/14 1241) Resp:  [18-20] 18 (04/14 1241) BP: (92-122)/(54-70) 93/54 (04/14 1241) SpO2:  [94 %-100 %] 94 % (04/14 1241)  Physical Exam: Physical Exam Cardiovascular:     Rate and Rhythm: Tachycardia present.  Pulmonary:  Effort: Pulmonary effort is normal. No respiratory distress.  Abdominal:     General: There is no distension.  Neurological:     General: No focal deficit present.     Mental Status: He is alert and oriented to person, place, and time.  Psychiatric:        Attention and Perception: Attention normal.        Mood and Affect: Mood is depressed.        Speech: Speech is delayed.        Cognition and Memory: Cognition normal.    stump site back in the shrink  CBC:    BMET Recent Labs    04/23/20 0244  NA 136  K 4.2  CL 105  CO2 26  GLUCOSE 93  BUN 15  CREATININE 0.78  CALCIUM 8.5*     Liver Panel  Recent Labs    04/23/20 0244  PROT 5.1*  ALBUMIN 2.3*  AST 29  ALT 24  ALKPHOS 48   BILITOT 0.8       Sedimentation Rate No results for input(s): ESRSEDRATE in the last 72 hours. C-Reactive Protein No results for input(s): CRP in the last 72 hours.  Micro Results: Recent Results (from the past 720 hour(s))  Blood culture (routine x 2)     Status: Abnormal   Collection Time: 04/15/20  6:07 PM   Specimen: BLOOD  Result Value Ref Range Status   Specimen Description BLOOD BLOOD LEFT FOREARM  Final   Special Requests   Final    BOTTLES DRAWN AEROBIC AND ANAEROBIC Blood Culture results may not be optimal due to an inadequate volume of blood received in culture bottles   Culture  Setup Time   Final    GRAM POSITIVE COCCI IN BOTH AEROBIC AND ANAEROBIC BOTTLES CRITICAL VALUE NOTED.  VALUE IS CONSISTENT WITH PREVIOUSLY REPORTED AND CALLED VALUE.    Culture (A)  Final    STAPHYLOCOCCUS AUREUS SUSCEPTIBILITIES PERFORMED ON PREVIOUS CULTURE WITHIN THE LAST 5 DAYS. Performed at [facility], [place] [address], [place], [place] [address]    Report Status 04/18/2020 FINAL  Final  Blood culture (routine x 2)     Status: Abnormal   Collection Time: 04/15/20  6:09 PM   Specimen: BLOOD LEFT HAND  Result Value Ref Range Status   Specimen Description BLOOD LEFT HAND  Final   Special Requests   Final    BOTTLES DRAWN AEROBIC AND ANAEROBIC Blood Culture results may not be optimal due to an inadequate volume of blood received in culture bottles   Culture  Setup Time   Final    GRAM POSITIVE COCCI IN BOTH AEROBIC AND ANAEROBIC BOTTLES CRITICAL RESULT CALLED TO, READ BACK BY AND VERIFIED WITH: PHARMD [place] AT 1219 ON 04/16/2020 BY T.SAAD Performed at [facility], [place] [address], [place], [place] [address]    Culture METHICILLIN RESISTANT STAPHYLOCOCCUS AUREUS (A)  Final   Report Status 04/18/2020 FINAL  Final   Organism ID, Bacteria METHICILLIN RESISTANT STAPHYLOCOCCUS AUREUS  Final      Susceptibility   Methicillin resistant staphylococcus aureus - MIC*     CIPROFLOXACIN >=8 RESISTANT Resistant     ERYTHROMYCIN >=8 RESISTANT Resistant     GENTAMICIN <=0.5 SENSITIVE Sensitive     OXACILLIN >=4 RESISTANT Resistant     TETRACYCLINE <=1 SENSITIVE Sensitive     VANCOMYCIN <=0.5 SENSITIVE Sensitive     TRIMETH/SULFA >=320 RESISTANT Resistant     CLINDAMYCIN <=0.25 SENSITIVE Sensitive  RIFAMPIN <=0.5 SENSITIVE Sensitive     Inducible Clindamycin NEGATIVE Sensitive     * METHICILLIN RESISTANT STAPHYLOCOCCUS AUREUS  Blood Culture ID Panel (Reflexed)     Status: Abnormal   Collection Time: 04/15/20  6:09 PM  Result Value Ref Range Status   Enterococcus faecalis NOT DETECTED NOT DETECTED Final   Enterococcus Faecium NOT DETECTED NOT DETECTED Final   Listeria monocytogenes NOT DETECTED NOT DETECTED Final   Staphylococcus species DETECTED (A) NOT DETECTED Final    Comment: CRITICAL RESULT CALLED TO, READ BACK BY AND VERIFIED WITH: PHAMD S.GRUBER AT 1219 ON 04/16/2020 BY T.SAAD.    Staphylococcus aureus (BCID) DETECTED (A) NOT DETECTED Final    Comment: Methicillin (oxacillin)-resistant Staphylococcus aureus (MRSA). MRSA is predictably resistant to beta-lactam antibiotics (except ceftaroline). Preferred therapy is vancomycin unless clinically contraindicated. Patient requires contact precautions if  hospitalized. CRITICAL RESULT CALLED TO, READ BACK BY AND VERIFIED WITH: PHAMD S.GRUBER AT 1219 ON 04/16/2020 BY T.SAAD.    Staphylococcus epidermidis NOT DETECTED NOT DETECTED Final   Staphylococcus lugdunensis NOT DETECTED NOT DETECTED Final   Streptococcus species NOT DETECTED NOT DETECTED Final   Streptococcus agalactiae NOT DETECTED NOT DETECTED Final   Streptococcus pneumoniae NOT DETECTED NOT DETECTED Final   Streptococcus pyogenes NOT DETECTED NOT DETECTED Final   A.calcoaceticus-baumannii NOT DETECTED NOT DETECTED Final   Bacteroides fragilis NOT DETECTED NOT DETECTED Final   Enterobacterales NOT DETECTED NOT DETECTED Final   Enterobacter  cloacae complex NOT DETECTED NOT DETECTED Final   Escherichia coli NOT DETECTED NOT DETECTED Final   Klebsiella aerogenes NOT DETECTED NOT DETECTED Final   Klebsiella oxytoca NOT DETECTED NOT DETECTED Final   Klebsiella pneumoniae NOT DETECTED NOT DETECTED Final   Proteus species NOT DETECTED NOT DETECTED Final   Salmonella species NOT DETECTED NOT DETECTED Final   Serratia marcescens NOT DETECTED NOT DETECTED Final   Haemophilus influenzae NOT DETECTED NOT DETECTED Final   Neisseria meningitidis NOT DETECTED NOT DETECTED Final   Pseudomonas aeruginosa NOT DETECTED NOT DETECTED Final   Stenotrophomonas maltophilia NOT DETECTED NOT DETECTED Final   Candida albicans NOT DETECTED NOT DETECTED Final   Candida auris NOT DETECTED NOT DETECTED Final   Candida glabrata NOT DETECTED NOT DETECTED Final   Candida krusei NOT DETECTED NOT DETECTED Final   Candida parapsilosis NOT DETECTED NOT DETECTED Final   Candida tropicalis NOT DETECTED NOT DETECTED Final   Cryptococcus neoformans/gattii NOT DETECTED NOT DETECTED Final   Meth resistant mecA/C and MREJ DETECTED (A) NOT DETECTED Final    Comment: CRITICAL RESULT CALLED TO, READ BACK BY AND VERIFIED WITH: PHAMD S.GRUBER AT 1219 ON 04/16/2020 BY T.SAAD. Performed at [facility], [place] [address], [place], [place] [address]   Resp Panel by RT-PCR (Flu A&B, Covid) Nasopharyngeal Swab     Status: None   Collection Time: 04/15/20  6:26 PM   Specimen: Nasopharyngeal Swab; Nasopharyngeal(NP) swabs in vial transport medium  Result Value Ref Range Status   SARS Coronavirus 2 by RT PCR NEGATIVE NEGATIVE Final    Comment: (NOTE) SARS-CoV-2 target nucleic acids are NOT DETECTED.  The SARS-CoV-2 RNA is generally detectable in upper respiratory specimens during the acute phase of infection. The lowest concentration of SARS-CoV-2 viral copies this assay can detect is 138 copies/mL. A negative result does not preclude SARS-Cov-2 infection and should not  be used as the sole basis for treatment or other patient management decisions. A negative result may occur with  improper specimen collection/handling, submission of specimen  other than nasopharyngeal swab, presence of viral mutation(s) within the areas targeted by this assay, and inadequate number of viral copies(<138 copies/mL). A negative result must be combined with clinical observations, patient history, and epidemiological information. The expected result is Negative.  Fact Sheet for Patients:  EntrepreneurPulse.com.au  Fact Sheet for Healthcare Providers:  IncredibleEmployment.be  This test is no t yet approved or cleared by the [place] FDA and  has been authorized for detection and/or diagnosis of SARS-CoV-2 by FDA under an Emergency Use Authorization (EUA). This EUA will remain  in effect (meaning this test can be used) for the duration of the COVID-19 declaration under Section 564(b)(1) of the Act, 21 U.S.C.section 360bbb-3(b)(1), unless the authorization is terminated  or revoked sooner.       Influenza A by PCR NEGATIVE NEGATIVE Final   Influenza B by PCR NEGATIVE NEGATIVE Final    Comment: (NOTE) The Xpert Xpress SARS-CoV-2/FLU/RSV plus assay is intended as an aid in the diagnosis of influenza from Nasopharyngeal swab specimens and should not be used as a sole basis for treatment. Nasal washings and aspirates are unacceptable for Xpert Xpress SARS-CoV-2/FLU/RSV testing.  Fact Sheet for Patients: EntrepreneurPulse.com.au  Fact Sheet for Healthcare Providers: IncredibleEmployment.be  This test is not yet approved or cleared by the [place] FDA and has been authorized for detection and/or diagnosis of SARS-CoV-2 by FDA under an Emergency Use Authorization (EUA). This EUA will remain in effect (meaning this test can be used) for the duration of the COVID-19 declaration under Section  564(b)(1) of the Act, 21 U.S.C. section 360bbb-3(b)(1), unless the authorization is terminated or revoked.  Performed at [facility], [place] [address], [place], [place] [address]   MRSA PCR Screening     Status: None   Collection Time: 04/16/20  8:58 PM   Specimen: Nasopharyngeal  Result Value Ref Range Status   MRSA by PCR NEGATIVE NEGATIVE Final    Comment:        The GeneXpert MRSA Assay (FDA approved for NASAL specimens only), is one component of a comprehensive MRSA colonization surveillance program. It is not intended to diagnose MRSA infection nor to guide or monitor treatment for MRSA infections. Performed at [facility], [place] [address], [place], [place] [address]   Culture, blood (routine x 2)     Status: None   Collection Time: 04/17/20  4:53 AM   Specimen: BLOOD LEFT HAND  Result Value Ref Range Status   Specimen Description BLOOD LEFT HAND  Final   Special Requests   Final    BOTTLES DRAWN AEROBIC AND ANAEROBIC Blood Culture adequate volume   Culture   Final    NO GROWTH 5 DAYS Performed at [facility], [place] [address], [place], [place] [address]    Report Status 04/22/2020 FINAL  Final  Culture, blood (routine x 2)     Status: None   Collection Time: 04/17/20  4:57 AM   Specimen: BLOOD  Result Value Ref Range Status   Specimen Description BLOOD LEFT ANTECUBITAL  Final   Special Requests   Final    BOTTLES DRAWN AEROBIC AND ANAEROBIC Blood Culture adequate volume   Culture   Final    NO GROWTH 5 DAYS Performed at [facility], [place] [address], [place], [place] [address]    Report Status 04/22/2020 FINAL  Final    Studies/Results: [place] EKG SITE RITE  Result Date: 04/23/2020 If Site Rite image not attached, placement could  not be confirmed due to current cardiac rhythm.     Assessment/Plan:  INTERVAL HISTORY: Patient pulled out his PICC line last night   Active Problems:   Hyponatremia   Chronic hepatitis C (HCC)    Bipolar disorder, most recent episode manic (HCC)   MRSA bacteremia   Subacute osteomyelitis, left ankle and foot ([place])   Septic shock (HCC)   Cutaneous abscess of left foot    Andrew Chaney is a 65 y.o. male with hx of IVDU, homelessness, HCV MRSA bacteremia secondary to abscess and osteomyelitis of left ankle, status post BKA.  TEE does not show evidence of vegetation   MRSA bacteremia:  Continue vancomycin   Would like to complete least 2 weeks of systemic antibiotics in the hospital.  And I would then give him a month of doxycyline 100 mg bid that he can take with him (presumably at SNF   Sp BKA:  I would think he is going to need placement somewhere to heal up his BKA site. Would not be appropriate to dc to shelter  HCV:   Can rx as an outpatient.   Andrew Chaney has an appointment on 05/28/2020 at 330 PM with Dr. Tommy Medal  The [facility] is located in the [facility] at  [place] in [place].  [address], which is located to the left of the elevators.  Phone: [phone]  Fax: [phone]  [email or web address]  He should arrive 15 to 30 minutes prior to his appointment.  I will sign off for now please call with further questions.    LOS: 9 days   Alcide Evener 04/24/2020, 3:19 PM

## 2020-04-24 NOTE — Progress Notes (Signed)
PT Cancellation Note  Patient Details Name: Andrew Chaney MRN: [id number] DOB: 08-Sep-1955   Cancelled Treatment:    Reason Eval/Treat Not Completed: Patient declined, no reason specified. Pt adamantly refusing any therapy just stating over and over "I can't". Explained to pt that he would have to participate in therapy to be able to go to any rehab. Pt would not verbalize what he planned to do at time of dc from the hospital. Pt continued to lie in bed with eyes covered and eventually would not speak to me.    [name] [facility] 04/24/2020, 10:48 AM [place] Pager [phone] Office [phone]

## 2020-04-24 NOTE — Progress Notes (Addendum)
Nutrition Follow-up  DOCUMENTATION CODES:  Severe malnutrition in context of acute illness/injury  INTERVENTION:   Ensure Enlive po TID, each supplement provides 350 kcal and 20 grams of protein.  Continue MVI with minerals daily.  Continue vitamin C and zinc sulfate to promote wound healing.  Continue Prosource Plus 30 ml PO BID, each packet provides 100 kcal and 15 gm protein  NUTRITION DIAGNOSIS:  Severe Malnutrition related to acute illness as evidenced by moderate fat depletion,moderate muscle depletion.  - progressing  GOAL:  Patient will meet greater than or equal to 90% of their needs  - progressing  MONITOR:  PO intake,Supplement acceptance  REASON FOR ASSESSMENT:  Consult Assessment of nutrition requirement/status  ASSESSMENT:  65 yo male admitted with left foot pain, osteomyelitis. PMH includes bipolar disorder, homelessness, recent calcaneous fracture, MSSA bacteremia, cannabis abuse, seizure disorder, psychosis, chronic hepatitis C.  Pt resting in bed with eyes covered at the time of assessment. Was able to answer some nutrition questions, but unable to stay focused on conversation at times. Breakfast tray recently arrived. Pt eager to eat and requested assistance getting set up. Unable to complete full physical exam at this time. Pt reports good intake, 0-100% average intake this admission (average 58% intake). States he is drinking supplements. Noted an empty Nepro and glucerna at bedside, which are not supplements that are ordered for pt. Noted pt refusing to work with PT and removing various lines and drains over the last few days.  4/6 - Op, Left BKA, application of wound vac 4/11 - TEE, no evidence of endocarditis 4/12 - pt pulled off wound vac 4/13 - PICC pulled out by pt  Nutritionally Relevant Meds: . vitamin C  1,000 mg Oral Daily  . cyanocobalamin  1,000 mcg Intramuscular Weekly  . docusate sodium  100 mg Oral Daily  . folic acid  1 mg Oral Daily   . multivitamin with minerals  1 tablet Oral Daily  . pantoprazole  40 mg Oral Daily  . senna  1 tablet Oral BID  . thiamine  250 mg Oral Daily  . zinc sulfate  220 mg Oral Daily   PRN Meds: alum & mag hydroxide-simeth, bisacodyl, magnesium citrate, magnesium sulfate bolus IVPB, ondansetron  Labs reviewed  NUTRITION - FOCUSED PHYSICAL EXAM: Flowsheet Row Most Recent Value  Orbital Region Mild depletion  Upper Arm Region Moderate depletion  Thoracic and Lumbar Region Moderate depletion  Buccal Region Mild depletion  Temple Region Moderate depletion  Clavicle Bone Region Mild depletion  Clavicle and Acromion Bone Region Moderate depletion  Scapular Bone Region Moderate depletion  Dorsal Hand Severe depletion  Patellar Region Unable to assess  Anterior Thigh Region Unable to assess  Posterior Calf Region Unable to assess  Edema (RD Assessment) None  Hair Reviewed  Eyes Unable to assess  Mouth Unable to assess  Skin Unable to assess  Nails Unable to assess      Diet Order:   Diet Order            Diet regular Room service appropriate? Yes; Fluid consistency: Thin  Diet effective now                EDUCATION NEEDS:  No education needs have been identified at this time  Skin:  Skin Assessment: Skin Integrity Issues: Skin Integrity Issues:: Incisions,Unstageable Unstageable: Ulcer to the hip from a fall Incisions: L BKA site  Last BM:  4/12 per RN documentation  Height:  Ht Readings from Last 1  Encounters:  04/15/20 6' (1.829 m)    Weight:  Wt Readings from Last 1 Encounters:  04/15/20 71 kg    Ideal Body Weight:  76.1 kg (adjusted by 5.9% for BKA)  BMI:  Body mass index is 22.6 kg/m. (adjusted by 5.9% for BKA)  Estimated Nutritional Needs:   Kcal:  2300-2500  Protein:  130-140 gm  Fluid:  >/= 2.3 L   Ranell Patrick, RD, LDN Clinical Dietitian Pager on Amion

## 2020-04-24 NOTE — [place] FL2 (Signed)
[place] MEDICAID FL2 LEVEL OF CARE SCREENING TOOL     IDENTIFICATION  Patient Name: Andrew Chaney Birthdate: 06-04-1955 Sex: male Admission Date (Current Location): 04/15/2020  [facility] and [place] Number:  Herbalist and Address:  [facility] [place] [facility]. [facility], [place] [address] [place], [place] [address]      Provider Number: [id number]  Attending Physician Name and Address:  Jonetta Osgood, MD  Relative Name and Phone Number:  Sister Vania Rea) [phone]    Current Level of Care: Hospital Recommended Level of Care: [place] Prior Approval Number:    Date Approved/Denied:   PASRR Number: Pending  Discharge Plan: SNF    Current Diagnoses: Patient Active Problem List   Diagnosis Date Noted  . Acute osteomyelitis of left calcaneus (HCC)   . Cutaneous abscess of left foot   . Subacute osteomyelitis, left ankle and foot ([place]) 04/15/2020  . Septic shock ([place]) 04/15/2020  . Insomnia 03/23/2020  . Dupuytren's contracture 08/05/2019  . MRSA bacteremia 07/31/2019  . Altered mental status   . Fever 07/30/2019  . Non-purulent Cellulitis of right upper arm 07/30/2019  . 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 Seizure ([place]) 07/26/2019  . Status epilepticus ([place]) 07/26/2019  . Psychosis ([place]) 09/05/2018  . Bipolar disorder, most recent episode manic ([place]) 04/11/2018  . Bipolar I disorder, current or most recent episode manic, with psychotic features ([place]) 04/08/2018  . Confusion 01/03/2015  . Chronic hepatitis C ([place]) 12/20/2012  . Renal failure 12/17/2012  . Acute encephalopathy 12/17/2012  . ARF (acute renal failure) ([place]) 12/17/2012  . Hyponatremia 12/17/2012  . CAP (community acquired pneumonia) 12/17/2012  . Rhabdomyolysis 12/17/2012  . 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[place]) 12/17/2012    Orientation RESPIRATION BLADDER Height & Weight     Self,Time,Situation,Place  Normal Continent Weight:  156 lb 8.4 oz (71 kg) Height:  6' (182.9 cm)  BEHAVIORAL SYMPTOMS/MOOD NEUROLOGICAL BOWEL NUTRITION STATUS      Continent Diet (See DC summary)  AMBULATORY STATUS COMMUNICATION OF NEEDS Skin   Limited Assist Verbally Other (Comment) (Unstagable pressure injury on right hip, closed incision on left leg)                       Personal Care Assistance Level of Assistance  Bathing,Feeding,Dressing Bathing Assistance: Limited assistance Feeding assistance: Independent Dressing Assistance: Limited assistance     Functional Limitations Info  Sight,Hearing,Speech Sight Info: Adequate Hearing Info: Adequate Speech Info: Adequate    SPECIAL CARE FACTORS FREQUENCY  PT (By licensed PT),OT (By licensed OT)     PT Frequency: 5X per week OT Frequency: 5X per week            Contractures      Additional Factors Info  Code Status,Allergies,Psychotropic,Isolation Precautions Code Status Info: FULL Allergies Info: NKA Psychotropic Info: Lithium   Isolation Precautions Info: MRSA     Current Medications (04/24/2020):  This is the current hospital active medication list Current Facility-Administered Medications  Medication Dose Route Frequency Provider Last Rate Last Admin  . (feeding supplement) PROSource Plus liquid 30 mL  30 mL Oral BID BM Newt Minion, MD   30 mL at 04/23/20 1348  . 0.9 %  sodium chloride infusion  250 mL Intravenous Continuous Newt Minion, MD   Stopped at 04/20/20 [phone]  . acetaminophen (TYLENOL) suppository 650 mg  650 mg Rectal Q6H PRN Newt Minion, MD      .  acetaminophen (TYLENOL) tablet 325-650 mg  325-650 mg Oral Q6H PRN Newt Minion, MD   650 mg at 04/23/20 0127  . albuterol (PROVENTIL) (2.5 MG/3ML) 0.083% nebulizer solution 2.5 mg  2.5 mg Nebulization Q2H PRN Newt Minion, MD      . alum & mag hydroxide-simeth (MAALOX/MYLANTA) 200-200-20 MG/5ML suspension 15-30 mL  15-30 mL Oral Q2H PRN Newt Minion, MD      . ascorbic acid (VITAMIN C)  tablet 1,000 mg  1,000 mg Oral Daily Newt Minion, MD   1,000 mg at 04/23/20 0914  . bisacodyl (DULCOLAX) EC tablet 5 mg  5 mg Oral Daily PRN Newt Minion, MD   5 mg at 04/22/20 0221  . Chlorhexidine Gluconate Cloth 2 % PADS 6 each  6 each Topical Daily [name], MD   6 each at 04/23/20 1348  . cyanocobalamin ((VITAMIN B-12)) injection 1,000 mcg  1,000 mcg Intramuscular Weekly Elgergawy, Silver Huguenin, MD   1,000 mcg at 04/19/20 0856  . docusate sodium (COLACE) capsule 100 mg  100 mg Oral Daily Newt Minion, MD   100 mg at 04/23/20 0915  . enoxaparin (LOVENOX) injection 40 mg  40 mg Subcutaneous Q24H Candee Furbish, MD   40 mg at 04/23/20 0919  . feeding supplement (ENSURE ENLIVE / ENSURE PLUS) liquid 237 mL  237 mL Oral TID BM Candee Furbish, MD   237 mL at 71/69/67 8938  . folic acid (FOLVITE) tablet 1 mg  1 mg Oral Daily Newt Minion, MD   1 mg at 04/23/20 0914  . guaiFENesin-dextromethorphan (ROBITUSSIN DM) 100-10 MG/5ML syrup 15 mL  15 mL Oral Q4H PRN Newt Minion, MD      . hydrALAZINE (APRESOLINE) injection 5 mg  5 mg Intravenous Q20 Min PRN Newt Minion, MD      . HYDROcodone-acetaminophen (NORCO/VICODIN) 5-325 MG per tablet 1-2 tablet  1-2 tablet Oral Q4H PRN Newt Minion, MD   2 tablet at 04/23/20 1758  . hydrOXYzine (ATARAX/VISTARIL) tablet 25 mg  25 mg Oral TID PRN Candee Furbish, MD      . hydrOXYzine (ATARAX/VISTARIL) tablet 50 mg  50 mg Oral QHS Armando Reichert, MD   50 mg at 04/23/20 2121  . labetalol (NORMODYNE) injection 10 mg  10 mg Intravenous Q10 min PRN Newt Minion, MD      . levETIRAcetam (KEPPRA) tablet 500 mg  500 mg Oral BID Candee Furbish, MD   500 mg at 04/23/20 2121  . lithium carbonate capsule 300 mg  300 mg Oral BID WC Candee Furbish, MD   300 mg at 04/23/20 1758  . magnesium citrate solution 1 Bottle  1 Bottle Oral Once PRN Newt Minion, MD      . magnesium sulfate IVPB 2 g 50 mL  2 g Intravenous Daily PRN Newt Minion, MD      .  melatonin tablet 10 mg  10 mg Oral QHS PRN Armando Reichert, MD   10 mg at 04/22/20 2159  . metoprolol tartrate (LOPRESSOR) injection 2-5 mg  2-5 mg Intravenous Q2H PRN Newt Minion, MD      . multivitamin with minerals tablet 1 tablet  1 tablet Oral Daily Newt Minion, MD   1 tablet at 04/23/20 0915  . ondansetron (ZOFRAN) injection 4 mg  4 mg Intravenous Q6H PRN Newt Minion, MD      . oxyCODONE (Oxy IR/ROXICODONE) immediate release  tablet 5 mg  5 mg Oral Q4H PRN Elgergawy, Silver Huguenin, MD   5 mg at 04/23/20 2139  . pantoprazole (PROTONIX) EC tablet 40 mg  40 mg Oral Daily Newt Minion, MD   40 mg at 04/23/20 0915  . phenol (CHLORASEPTIC) mouth spray 1 spray  1 spray Mouth/Throat PRN Newt Minion, MD      . senna [facility]) tablet 8.6 mg  1 tablet Oral BID Newt Minion, MD   8.6 mg at 04/23/20 2121  . sodium chloride flush (NS) 0.9 % injection 10-40 mL  10-40 mL Intracatheter Q12H [name], MD   10 mL at 04/23/20 2121  . sodium chloride flush (NS) 0.9 % injection 10-40 mL  10-40 mL Intracatheter PRN Freda Jackson B, MD      . sodium chloride flush (NS) 0.9 % injection 10-40 mL  10-40 mL Intracatheter PRN Ghimire, Henreitta Leber, MD      . thiamine tablet 250 mg  250 mg Oral Daily Elgergawy, Silver Huguenin, MD   250 mg at 04/23/20 0914  . vancomycin (VANCOREADY) IVPB 1000 mg/200 mL  1,000 mg Intravenous Q12H Newt Minion, MD 200 mL/hr at 04/24/20 0637 1,000 mg at 04/24/20 0637  . zinc sulfate capsule 220 mg  220 mg Oral Daily Newt Minion, MD   220 mg at 04/23/20 4540     Discharge Medications: Please see discharge summary for a list of discharge medications.  Relevant Imaging Results:  Relevant Lab Results:   Additional Information SSN: [id number]  Glennon Hamilton, Student-Social Work

## 2020-04-24 NOTE — Progress Notes (Signed)
PROGRESS NOTE        PATIENT DETAILS Name: Alfard Cochrane Age: 65 y.o. Sex: male Date of Birth: 11-19-1955 Admit Date: 04/15/2020 Admitting Physician [name], MD ASN:[id number], No Pcp Per (Inactive)  Brief Narrative: Patient is a 65 y.o. male with history of bipolar disorder, MSSA bacteremia, chronic hepatitis C-who was diagnosed with left calcaneal fracture several months ago-homelessness-who was brought to the ED by [employer]-he was found to have osteomyelitis of his left foot-with septic shock-he was managed in the ICU by PCCM-upon stability-transfer to the [facility].  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  See below for further details.  Significant events: 4/5>> admit for left foot osteomyelitis 4/6>> septic shock-staph aureus bacteremia-PCCM consulted 4/6>> left BKA 4/11>> TEE  Significant studies: 4/5>> x-ray left foot: Comminuted fracture of the posterior calcaneus-osteomyelitis. 4/5>> chest x-ray: Right base atelectasis 4/6>> vitamin B12 level: 52 4/7>> TTE: EF 60-65% 4/11>> TEE: No vegetations, EF 60-65%  Antimicrobial therapy: Vancomycin: 4/5>> Flagyl: 4/5>> 4/6 Cefepime: 4/5>> 4/6  Microbiology data: 4/5>> blood culture: MRSA 4/7>> blood culture: No growth  Procedures : 4/6>> left BKA (Dr. Sharol Given) 4/11>> TEE: No vegetations, EF 60-65%  Consults: PCCM, orthopedics, infectious disease, psychiatry  DVT Prophylaxis : enoxaparin (LOVENOX) injection 40 mg Start: 04/17/20 1400 SCDs Start: 04/16/20 1249   Subjective: Pulled out PICC line yesterday-explained to patient that he needs a IV line to complete vancomycin treatment.  Understands that he is at risk of worsening infection/recurrence of bacteremia if he does not complete antimicrobial therapy.   Assessment/Plan: Septic shock due to MRSA bacteremia/left foot osteomyelitis with abscess involving his left foot: Sepsis physiology has resolved-remains on IV vancomycin-patient  is now s/p left BKA.  TEE negative for vegetations.  Needs 2 weeks of antibiotics from date of negative blood cultures.  Right hip pressure ulcer: Due to slipping on concrete-patient home meds-continue wound care.  Bipolar disorder: Previously noncompliant with medications-evaluated by psychiatry-started on lithium  Seizure disorder: Continue Keppra  Vitamin B12 deficiency: Continue daily IM supplementation-switch to once weekly dosing on 4//16  Hyponatremia: Resolved  Normocytic anemia: Due to acute illness-chronic osteomyelitis-follow CBC periodically  Chronic hepatitis C: Per ID-plans are for outpatient treatment.  Nutrition Problem: Nutrition Problem: Increased nutrient needs Etiology: wound healing Signs/Symptoms: estimated needs Interventions: Ensure Enlive (each supplement provides 350kcal and 20 grams of protein),Prostat,MVI   Diet: Diet Order            Diet regular Room service appropriate? Yes; Fluid consistency: Thin  Diet effective now                  Code Status: Full code   Family Communication: None at bedside   Disposition Plan: Status is: Inpatient  Remains inpatient appropriate because:Inpatient level of care appropriate due to severity of illness   Dispo: The patient is from: Home              Anticipated d/c is to: TBD              Patient currently is not medically stable to d/c.   Difficult to place patient Yes   Barriers to Discharge: MRSA bacteremia-on IV vancomycin-requires SNF placement  Antimicrobial agents: Anti-infectives (From admission, onward)   Start     Dose/Rate Route Frequency Ordered Stop   04/23/20 0130  linezolid (ZYVOX) tablet 600 mg  Status:  Discontinued  600 mg Oral Every 12 hours 04/23/20 0039 04/23/20 0854   04/16/20 0630  vancomycin (VANCOREADY) IVPB 1000 mg/200 mL       "Followed by" Linked Group Details   1,000 mg 200 mL/hr over 60 Minutes Intravenous Every 12 hours 04/15/20 1824     04/16/20 0200   metroNIDAZOLE (FLAGYL) IVPB 500 mg  Status:  Discontinued        500 mg 100 mL/hr over 60 Minutes Intravenous Every 8 hours 04/15/20 1858 04/17/20 0945   04/15/20 1830  ceFEPIme (MAXIPIME) 2 g in sodium chloride 0.9 % 100 mL IVPB  Status:  Discontinued        2 g 200 mL/hr over 30 Minutes Intravenous Every 8 hours 04/15/20 1824 04/17/20 0849   04/15/20 1830  vancomycin (VANCOREADY) IVPB 1500 mg/300 mL       "Followed by" Linked Group Details   1,500 mg 150 mL/hr over 120 Minutes Intravenous  Once 04/15/20 1824 04/16/20 0003   04/15/20 1745  metroNIDAZOLE (FLAGYL) IVPB 500 mg        500 mg 100 mL/hr over 60 Minutes Intravenous  Once 04/15/20 1737 04/15/20 1914       Time spent: 15- minutes-Greater than 50% of this time was spent in counseling, explanation of diagnosis, planning of further management, and coordination of care.  MEDICATIONS: Scheduled Meds: . (feeding supplement) PROSource Plus  30 mL Oral BID BM  . vitamin C  1,000 mg Oral Daily  . Chlorhexidine Gluconate Cloth  6 each Topical Daily  . cyanocobalamin  1,000 mcg Intramuscular Weekly  . docusate sodium  100 mg Oral Daily  . enoxaparin (LOVENOX) injection  40 mg Subcutaneous Q24H  . feeding supplement  237 mL Oral TID BM  . folic acid  1 mg Oral Daily  . hydrOXYzine  50 mg Oral QHS  . levETIRAcetam  500 mg Oral BID  . lithium carbonate  300 mg Oral BID WC  . multivitamin with minerals  1 tablet Oral Daily  . pantoprazole  40 mg Oral Daily  . senna  1 tablet Oral BID  . sodium chloride flush  10-40 mL Intracatheter Q12H  . thiamine  250 mg Oral Daily  . zinc sulfate  220 mg Oral Daily   Continuous Infusions: . sodium chloride Stopped (04/20/20 0858)  . magnesium sulfate bolus IVPB    . vancomycin 1,000 mg (04/24/20 0637)   PRN Meds:.[DISCONTINUED] acetaminophen **OR** acetaminophen, acetaminophen, albuterol, alum & mag hydroxide-simeth, bisacodyl, guaiFENesin-dextromethorphan, hydrALAZINE,  HYDROcodone-acetaminophen, hydrOXYzine, labetalol, magnesium citrate, magnesium sulfate bolus IVPB, melatonin, metoprolol tartrate, ondansetron, oxyCODONE, phenol, sodium chloride flush, sodium chloride flush   PHYSICAL EXAM: Vital signs: Vitals:   04/23/20 2000 04/24/20 0029 04/24/20 0816 04/24/20 1241  BP: 120/68 122/70 (!) 92/54 (!) 93/54  Pulse: 80 80 78 84  Resp: 20 20 18 18   Temp: 98.1 F (36.7 C) 98.4 F (36.9 C) 97.9 F (36.6 C) 98.2 F (36.8 C)  TempSrc: Axillary Axillary Axillary Axillary  SpO2: 99% 100% 94% 94%  Weight:      Height:       Filed Weights   04/15/20 1800  Weight: 71 kg   Body mass index is 21.23 kg/m.   Gen Exam:Alert awake-not in any distress HEENT:atraumatic, normocephalic Chest: B/L clear to auscultation anteriorly CVS:S1S2 regular Abdomen:soft non tender, non distended Extremities: Left BKA-site appears intact. Neurology: Non focal Skin: no rash  I have personally reviewed following labs and imaging studies  LABORATORY DATA: CBC: Recent Labs  Lab 04/18/20 0100 04/19/20 0541 04/20/20 0018 04/21/20 0417 04/23/20 0244  WBC 16.2* 9.7 10.3 10.1 9.5  HGB 10.3* 10.5* 11.1* 10.6* 10.7*  HCT 30.9* 31.7* 34.3* 31.6* 32.7*  MCV 96.9 96.9 99.4 97.2 96.7  PLT 357 401* 424* 459* 506*    Basic Metabolic Panel: Recent Labs  Lab 04/18/20 0100 04/20/20 0018 04/21/20 0417 04/23/20 0244  NA 137 136 134* 136  K 4.6 4.6 4.4 4.2  CL 110 106 105 105  CO2 23 27 27 26   GLUCOSE 170* 109* 100* 93  BUN 21 20 15 15   CREATININE 0.79 0.78 0.68 0.78  CALCIUM 8.0* 8.2* 8.5* 8.5*  MG  --   --  2.0  --   PHOS  --   --  3.4  --     GFR: Estimated Creatinine Clearance: 96.1 mL/min (by C-G formula based on SCr of 0.78 mg/dL).  Liver Function Tests: Recent Labs  Lab 04/20/20 0018 04/23/20 0244  AST 33 29  ALT 19 24  ALKPHOS 51 48  BILITOT 0.6 0.8  PROT 4.7* 5.1*  ALBUMIN 1.9* 2.3*   No results for input(s): LIPASE, AMYLASE in the last 168  hours. No results for input(s): AMMONIA in the last 168 hours.  Coagulation Profile: No results for input(s): INR, PROTIME in the last 168 hours.  Cardiac Enzymes: No results for input(s): CKTOTAL, CKMB, CKMBINDEX, TROPONINI in the last 168 hours.  BNP (last 3 results) No results for input(s): PROBNP in the last 8760 hours.  Lipid Profile: No results for input(s): CHOL, HDL, LDLCALC, TRIG, CHOLHDL, LDLDIRECT in the last 72 hours.  Thyroid Function Tests: No results for input(s): TSH, T4TOTAL, FREET4, T3FREE, THYROIDAB in the last 72 hours.  Anemia Panel: No results for input(s): VITAMINB12, FOLATE, FERRITIN, TIBC, IRON, RETICCTPCT in the last 72 hours.  Urine analysis:    Component Value Date/Time   COLORURINE AMBER (A) 04/16/2020 0103   APPEARANCEUR CLEAR 04/16/2020 0103   LABSPEC 1.031 (H) 04/16/2020 0103   PHURINE 5.0 04/16/2020 0103   GLUCOSEU NEGATIVE 04/16/2020 0103   HGBUR NEGATIVE 04/16/2020 0103   BILIRUBINUR NEGATIVE 04/16/2020 0103   KETONESUR NEGATIVE 04/16/2020 0103   PROTEINUR 30 (A) 04/16/2020 0103   UROBILINOGEN 0.2 03/13/2013 1933   NITRITE NEGATIVE 04/16/2020 0103   LEUKOCYTESUR NEGATIVE 04/16/2020 0103    Sepsis Labs: Lactic Acid, Venous    Component Value Date/Time   LATICACIDVEN 1.1 04/17/2020 0407    MICROBIOLOGY: Recent Results (from the past 240 hour(s))  Blood culture (routine x 2)     Status: Abnormal   Collection Time: 04/15/20  6:07 PM   Specimen: BLOOD  Result Value Ref Range Status   Specimen Description BLOOD BLOOD LEFT FOREARM  Final   Special Requests   Final    BOTTLES DRAWN AEROBIC AND ANAEROBIC Blood Culture results may not be optimal due to an inadequate volume of blood received in culture bottles   Culture  Setup Time   Final    GRAM POSITIVE COCCI IN BOTH AEROBIC AND ANAEROBIC BOTTLES CRITICAL VALUE NOTED.  VALUE IS CONSISTENT WITH PREVIOUSLY REPORTED AND CALLED VALUE.    Culture (A)  Final    STAPHYLOCOCCUS  AUREUS SUSCEPTIBILITIES PERFORMED ON PREVIOUS CULTURE WITHIN THE LAST 5 DAYS. Performed at [facility], [place] [address], [place], [place] [address]    Report Status 04/18/2020 FINAL  Final  Blood culture (routine x 2)     Status: Abnormal   Collection Time: 04/15/20  6:09 PM  Specimen: BLOOD LEFT HAND  Result Value Ref Range Status   Specimen Description BLOOD LEFT HAND  Final   Special Requests   Final    BOTTLES DRAWN AEROBIC AND ANAEROBIC Blood Culture results may not be optimal due to an inadequate volume of blood received in culture bottles   Culture  Setup Time   Final    GRAM POSITIVE COCCI IN BOTH AEROBIC AND ANAEROBIC BOTTLES CRITICAL RESULT CALLED TO, READ BACK BY AND VERIFIED WITH: PHARMD [place] AT 1219 ON 04/16/2020 BY T.SAAD Performed at [facility], [place] [address], [place], [place] [address]    Culture METHICILLIN RESISTANT STAPHYLOCOCCUS AUREUS (A)  Final   Report Status 04/18/2020 FINAL  Final   Organism ID, Bacteria METHICILLIN RESISTANT STAPHYLOCOCCUS AUREUS  Final      Susceptibility   Methicillin resistant staphylococcus aureus - MIC*    CIPROFLOXACIN >=8 RESISTANT Resistant     ERYTHROMYCIN >=8 RESISTANT Resistant     GENTAMICIN <=0.5 SENSITIVE Sensitive     OXACILLIN >=4 RESISTANT Resistant     TETRACYCLINE <=1 SENSITIVE Sensitive     VANCOMYCIN <=0.5 SENSITIVE Sensitive     TRIMETH/SULFA >=320 RESISTANT Resistant     CLINDAMYCIN <=0.25 SENSITIVE Sensitive     RIFAMPIN <=0.5 SENSITIVE Sensitive     Inducible Clindamycin NEGATIVE Sensitive     * METHICILLIN RESISTANT STAPHYLOCOCCUS AUREUS  Blood Culture ID Panel (Reflexed)     Status: Abnormal   Collection Time: 04/15/20  6:09 PM  Result Value Ref Range Status   Enterococcus faecalis NOT DETECTED NOT DETECTED Final   Enterococcus Faecium NOT DETECTED NOT DETECTED Final   Listeria monocytogenes NOT DETECTED NOT DETECTED Final   Staphylococcus species DETECTED (A) NOT DETECTED Final     Comment: CRITICAL RESULT CALLED TO, READ BACK BY AND VERIFIED WITH: PHAMD S.GRUBER AT 1219 ON 04/16/2020 BY T.SAAD.    Staphylococcus aureus (BCID) DETECTED (A) NOT DETECTED Final    Comment: Methicillin (oxacillin)-resistant Staphylococcus aureus (MRSA). MRSA is predictably resistant to beta-lactam antibiotics (except ceftaroline). Preferred therapy is vancomycin unless clinically contraindicated. Patient requires contact precautions if  hospitalized. CRITICAL RESULT CALLED TO, READ BACK BY AND VERIFIED WITH: PHAMD S.GRUBER AT 1219 ON 04/16/2020 BY T.SAAD.    Staphylococcus epidermidis NOT DETECTED NOT DETECTED Final   Staphylococcus lugdunensis NOT DETECTED NOT DETECTED Final   Streptococcus species NOT DETECTED NOT DETECTED Final   Streptococcus agalactiae NOT DETECTED NOT DETECTED Final   Streptococcus pneumoniae NOT DETECTED NOT DETECTED Final   Streptococcus pyogenes NOT DETECTED NOT DETECTED Final   A.calcoaceticus-baumannii NOT DETECTED NOT DETECTED Final   Bacteroides fragilis NOT DETECTED NOT DETECTED Final   Enterobacterales NOT DETECTED NOT DETECTED Final   Enterobacter cloacae complex NOT DETECTED NOT DETECTED Final   Escherichia coli NOT DETECTED NOT DETECTED Final   Klebsiella aerogenes NOT DETECTED NOT DETECTED Final   Klebsiella oxytoca NOT DETECTED NOT DETECTED Final   Klebsiella pneumoniae NOT DETECTED NOT DETECTED Final   Proteus species NOT DETECTED NOT DETECTED Final   Salmonella species NOT DETECTED NOT DETECTED Final   Serratia marcescens NOT DETECTED NOT DETECTED Final   Haemophilus influenzae NOT DETECTED NOT DETECTED Final   Neisseria meningitidis NOT DETECTED NOT DETECTED Final   Pseudomonas aeruginosa NOT DETECTED NOT DETECTED Final   Stenotrophomonas maltophilia NOT DETECTED NOT DETECTED Final   Candida albicans NOT DETECTED NOT DETECTED Final   Candida auris NOT DETECTED NOT DETECTED Final   Candida glabrata NOT DETECTED NOT DETECTED Final  Candida  krusei NOT DETECTED NOT DETECTED Final   Candida parapsilosis NOT DETECTED NOT DETECTED Final   Candida tropicalis NOT DETECTED NOT DETECTED Final   Cryptococcus neoformans/gattii NOT DETECTED NOT DETECTED Final   Meth resistant mecA/C and MREJ DETECTED (A) NOT DETECTED Final    Comment: CRITICAL RESULT CALLED TO, READ BACK BY AND VERIFIED WITH: PHAMD S.GRUBER AT 1219 ON 04/16/2020 BY T.SAAD. Performed at [facility], [place] [address] [place], [place] [address]   Resp Panel by RT-PCR (Flu A&B, Covid) Nasopharyngeal Swab     Status: None   Collection Time: 04/15/20  6:26 PM   Specimen: Nasopharyngeal Swab; Nasopharyngeal(NP) swabs in vial transport medium  Result Value Ref Range Status   SARS Coronavirus 2 by RT PCR NEGATIVE NEGATIVE Final    Comment: (NOTE) SARS-CoV-2 target nucleic acids are NOT DETECTED.  The SARS-CoV-2 RNA is generally detectable in upper respiratory specimens during the acute phase of infection. The lowest concentration of SARS-CoV-2 viral copies this assay can detect is 138 copies/mL. A negative result does not preclude SARS-Cov-2 infection and should not be used as the sole basis for treatment or other patient management decisions. A negative result may occur with  improper specimen collection/handling, submission of specimen other than nasopharyngeal swab, presence of viral mutation(s) within the areas targeted by this assay, and inadequate number of viral copies(<138 copies/mL). A negative result must be combined with clinical observations, patient history, and epidemiological information. The expected result is Negative.  Fact Sheet for Patients:  EntrepreneurPulse.com.au  Fact Sheet for Healthcare Providers:  IncredibleEmployment.be  This test is no t yet approved or cleared by the [place] FDA and  has been authorized for detection and/or diagnosis of SARS-CoV-2 by FDA under an Emergency Use Authorization  (EUA). This EUA will remain  in effect (meaning this test can be used) for the duration of the COVID-19 declaration under Section 564(b)(1) of the Act, 21 U.S.C.section 360bbb-3(b)(1), unless the authorization is terminated  or revoked sooner.       Influenza A by PCR NEGATIVE NEGATIVE Final   Influenza B by PCR NEGATIVE NEGATIVE Final    Comment: (NOTE) The Xpert Xpress SARS-CoV-2/FLU/RSV plus assay is intended as an aid in the diagnosis of influenza from Nasopharyngeal swab specimens and should not be used as a sole basis for treatment. Nasal washings and aspirates are unacceptable for Xpert Xpress SARS-CoV-2/FLU/RSV testing.  Fact Sheet for Patients: EntrepreneurPulse.com.au  Fact Sheet for Healthcare Providers: IncredibleEmployment.be  This test is not yet approved or cleared by the [place] FDA and has been authorized for detection and/or diagnosis of SARS-CoV-2 by FDA under an Emergency Use Authorization (EUA). This EUA will remain in effect (meaning this test can be used) for the duration of the COVID-19 declaration under Section 564(b)(1) of the Act, 21 U.S.C. section 360bbb-3(b)(1), unless the authorization is terminated or revoked.  Performed at [facility], [place] [address] [place], [place] [address]   MRSA PCR Screening     Status: None   Collection Time: 04/16/20  8:58 PM   Specimen: Nasopharyngeal  Result Value Ref Range Status   MRSA by PCR NEGATIVE NEGATIVE Final    Comment:        The GeneXpert MRSA Assay (FDA approved for NASAL specimens only), is one component of a comprehensive MRSA colonization surveillance program. It is not intended to diagnose MRSA infection nor to guide or monitor treatment for MRSA infections. Performed at [facility], [address] [place], [place] [address]   Culture, blood (routine x 2)     Status: None   Collection Time: 04/17/20  4:53 AM   Specimen: BLOOD LEFT  HAND  Result Value Ref Range Status   Specimen Description BLOOD LEFT HAND  Final   Special Requests   Final    BOTTLES DRAWN AEROBIC AND ANAEROBIC Blood Culture adequate volume   Culture   Final    NO GROWTH 5 DAYS Performed at [facility], [place] [address], [place], [place] [address]    Report Status 04/22/2020 FINAL  Final  Culture, blood (routine x 2)     Status: None   Collection Time: 04/17/20  4:57 AM   Specimen: BLOOD  Result Value Ref Range Status   Specimen Description BLOOD LEFT ANTECUBITAL  Final   Special Requests   Final    BOTTLES DRAWN AEROBIC AND ANAEROBIC Blood Culture adequate volume   Culture   Final    NO GROWTH 5 DAYS Performed at [facility], [place] [address], [place], [place] [address]    Report Status 04/22/2020 FINAL  Final    RADIOLOGY STUDIES/RESULTS: [place] EKG SITE RITE  Result Date: 04/23/2020 If Site Rite image not attached, placement could not be confirmed due to current cardiac rhythm.    LOS: 9 days   Oren Binet, MD  [facility]    To contact the attending provider between 7A-7P or the covering provider during after hours 7P-7A, please log into the web site [email or web address] and access using universal [place] password for that web site. If you do not have the password, please call the hospital operator.  04/24/2020, 1:41 PM

## 2020-04-24 NOTE — Progress Notes (Addendum)
CSW sent request to [facility], Kristopher Oppenheim., to screen patient for Medicaid/Disability as lack of insurance is a SNF barrier.  Sohum Delillo LCSW

## 2020-04-25 DIAGNOSIS — E43 Unspecified severe protein-calorie malnutrition: Secondary | ICD-10-CM | POA: Insufficient documentation

## 2020-04-25 NOTE — Progress Notes (Signed)
Paged Dr. Marlowe Sax, MD notified of patient continuing to refuse telemetry monitoring. No new orders received at this time.

## 2020-04-25 NOTE — Progress Notes (Signed)
Patient still refuses to wear brace on amputation.

## 2020-04-25 NOTE — TOC Initial Note (Signed)
Transition of Care [facility]) - Initial/Assessment Note    Patient Details  Name: Andrew Chaney MRN: [id number] Date of Birth: [birth year]  [facility] CM/SW Contact:    Andrew Halsted, LCSW Phone Number: 04/25/2020, 12:26 PM  Clinical Narrative:                 CSW received call from patient's brother, Andrew Chaney. He requested an update on patient's discharge plan and if anyone would be able to help him apply for Medicaid/Disability. CSW explained that beyond remaining in the hospital for IV antibiotics, there is no plan in place yet as patient has multiple social barriers for placement. CSW [employer], [place], with Andrew Chaney's contact info in case they need it.   Expected Discharge Plan: [place] (vs SNF) Barriers to Discharge: Inadequate or no insurance,No SNF bed,SNF Pending Medicaid,Active Substance Use - Placement,Homeless with medical needs   Patient Goals and CMS Choice        Expected Discharge Plan and Services Expected Discharge Plan: [place] (vs SNF) In-house Referral: Clinical Social Work,Financial Counselor     Living arrangements for the past 2 months: Homeless                                      Prior Living Arrangements/Services Living arrangements for the past 2 months: Homeless Lives with:: Self Patient language and need for interpreter reviewed:: Yes        Need for Family Participation in Patient Care: Yes (Comment) Care giver support system in place?: No (comment)   Criminal Activity/Legal Involvement Pertinent to Current Situation/Hospitalization: No - Comment as needed  Activities of Daily Living      Permission Sought/Granted      Share Information with NAME: Andrew Chaney     Permission granted to share info w Relationship: Brother  Permission granted to share info w Contact Information: [phone]  Emotional Assessment Appearance:: Appears stated age     Orientation: : Oriented to Self,Oriented to  Place,Oriented to  Time,Oriented to Situation Alcohol / Substance Use: Illicit Drugs Psych Involvement: No (comment)  Admission diagnosis:  Osteomyelitis ([place]) [M86.9] Left foot pain [M79.672] Sepsis ([place]) [A41.9] Fever, unspecified fever cause [R50.9] Patient Active Problem List   Diagnosis Date Noted  . Protein-calorie malnutrition, severe 04/25/2020  . Left foot pain   . IVDU (intravenous drug user)   . Acute osteomyelitis of left calcaneus (HCC)   . Cutaneous abscess of left foot   . Subacute osteomyelitis, left ankle and foot ([place]) 04/15/2020  . Septic shock ([place]) 04/15/2020  . Insomnia 03/23/2020  . Dupuytren's contracture 08/05/2019  . MRSA bacteremia 07/31/2019  . Altered mental status   . Fever 07/30/2019  . Non-purulent Cellulitis of right upper arm 07/30/2019  . 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[place]) 07/29/2019  . Seizure ([place]) 07/26/2019  . Status epilepticus ([place]) 07/26/2019  . Psychosis ([place]) 09/05/2018  . Bipolar disorder, most recent episode manic ([place]) 04/11/2018  . Bipolar I disorder, current or most recent episode manic, with psychotic features ([place]) 04/08/2018  . Confusion 01/03/2015  . Chronic hepatitis C ([place]) 12/20/2012  . Renal failure 12/17/2012  . Acute encephalopathy 12/17/2012  . ARF (acute renal failure) ([place]) 12/17/2012  . Hyponatremia 12/17/2012  . CAP (community acquired pneumonia) 12/17/2012  . Rhabdomyolysis 12/17/2012  . 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[place]) 12/17/2012   PCP:  Patient, No Pcp  Per (Inactive) Pharmacy:   [facility] [address] [place] [address] Phone: [phone] Fax: [phone]  [id number] [place], [place] [address] [place] [address] Phone: [phone] Fax: [phone]     Social Determinants of Health (SDOH) Interventions    Readmission Risk  Interventions Readmission Risk Prevention Plan 04/25/2020  Transportation Screening Complete  Medication Review (RN Care Manager) Referral to Pharmacy  PCP or Specialist appointment within 3-5 days of discharge Complete  HRI or Home Care Consult Complete  SW Recovery Care/Counseling Consult Complete  Palliative Care Screening Not [place] Complete  Some recent data might be hidden

## 2020-04-25 NOTE — Progress Notes (Signed)
Patient refusing to wear brace on amputation. Patient states he can not keep his leg straight and the brace is uncomfortable.

## 2020-04-25 NOTE — Progress Notes (Signed)
PROGRESS NOTE        PATIENT DETAILS Name: Andrew Chaney Age: 65 y.o. Sex: male Date of Birth: 1955/05/07 Admit Date: 04/15/2020 Admitting Physician [name], MD MWN:[id number], No Pcp Per (Inactive)  Brief Narrative: Patient is a 65 y.o. male with history of bipolar disorder, MSSA bacteremia, chronic hepatitis C-who was diagnosed with left calcaneal fracture several months ago-homelessness-who was brought to the ED by [facility]-he was found to have osteomyelitis of his left foot-with septic shock-he was managed in the ICU by PCCM-upon stability-transfer to the [facility].  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  See below for further details.  Significant events: 4/5>> admit for left foot osteomyelitis 4/6>> septic shock-staph aureus bacteremia-PCCM consulted 4/6>> left BKA 4/11>> TEE  Significant studies: 4/5>> x-ray left foot: Comminuted fracture of the posterior calcaneus-osteomyelitis. 4/5>> chest x-ray: Right base atelectasis 4/6>> vitamin B12 level: 52 4/7>> TTE: EF 60-65% 4/11>> TEE: No vegetations, EF 60-65%  Antimicrobial therapy: Vancomycin: 4/5>> Flagyl: 4/5>> 4/6 Cefepime: 4/5>> 4/6  Microbiology data: 4/5>> blood culture: MRSA 4/7>> blood culture: No growth  Procedures : 4/6>> left BKA (Dr. Sharol Given) 4/11>> TEE: No vegetations, EF 60-65%  Consults: PCCM, orthopedics, infectious disease, psychiatry  DVT Prophylaxis : enoxaparin (LOVENOX) injection 40 mg Start: 04/17/20 1400 SCDs Start: 04/16/20 1249   Subjective: No major issues overnight-lying comfortably in bed.   Assessment/Plan: Septic shock due to MRSA bacteremia/left foot osteomyelitis with abscess involving his left foot: Sepsis physiology has resolved-remains on IV vancomycin-patient is now s/p left BKA.  TEE negative for vegetations.  Needs 2 weeks of antibiotics from date of negative blood cultures.  Right hip pressure ulcer: Due to slipping on concrete-patient  home meds-continue wound care.  Bipolar disorder: Previously noncompliant with medications-evaluated by psychiatry-started on lithium  Seizure disorder: Continue Keppra  Vitamin B12 deficiency: Continue daily IM supplementation-switch to once weekly dosing on 4//16  Hyponatremia: Resolved  Normocytic anemia: Due to acute illness-chronic osteomyelitis-follow CBC periodically  Chronic hepatitis C: Per ID-plans are for outpatient treatment.  Nutrition Problem: Nutrition Problem: Severe Malnutrition Etiology: acute illness Signs/Symptoms: moderate fat depletion,moderate muscle depletion Interventions: Refer to RD note for recommendations   Diet: Diet Order            Diet regular Room service appropriate? Yes; Fluid consistency: Thin  Diet effective now                  Code Status: Full code   Family Communication: None at bedside   Disposition Plan: Status is: Inpatient  Remains inpatient appropriate because:Inpatient level of care appropriate due to severity of illness   Dispo: The patient is from: Home              Anticipated d/c is to: TBD              Patient currently is not medically stable to d/c.   Difficult to place patient Yes   Barriers to Discharge: MRSA bacteremia-on IV vancomycin-requires SNF placement  Antimicrobial agents: Anti-infectives (From admission, onward)   Start     Dose/Rate Route Frequency Ordered Stop   04/23/20 0130  linezolid (ZYVOX) tablet 600 mg  Status:  Discontinued        600 mg Oral Every 12 hours 04/23/20 0039 04/23/20 0854   04/16/20 0630  vancomycin (VANCOREADY) IVPB 1000 mg/200 mL       "  Followed by" Linked Group Details   1,000 mg 200 mL/hr over 60 Minutes Intravenous Every 12 hours 04/15/20 1824     04/16/20 0200  metroNIDAZOLE (FLAGYL) IVPB 500 mg  Status:  Discontinued        500 mg 100 mL/hr over 60 Minutes Intravenous Every 8 hours 04/15/20 1858 04/17/20 0945   04/15/20 1830  ceFEPIme (MAXIPIME) 2 g in sodium  chloride 0.9 % 100 mL IVPB  Status:  Discontinued        2 g 200 mL/hr over 30 Minutes Intravenous Every 8 hours 04/15/20 1824 04/17/20 0849   04/15/20 1830  vancomycin (VANCOREADY) IVPB 1500 mg/300 mL       "Followed by" Linked Group Details   1,500 mg 150 mL/hr over 120 Minutes Intravenous  Once 04/15/20 1824 04/16/20 0003   04/15/20 1745  metroNIDAZOLE (FLAGYL) IVPB 500 mg        500 mg 100 mL/hr over 60 Minutes Intravenous  Once 04/15/20 1737 04/15/20 1914       Time spent: 15- minutes-Greater than 50% of this time was spent in counseling, explanation of diagnosis, planning of further management, and coordination of care.  MEDICATIONS: Scheduled Meds: . (feeding supplement) PROSource Plus  30 mL Oral BID BM  . vitamin C  1,000 mg Oral Daily  . Chlorhexidine Gluconate Cloth  6 each Topical Daily  . cyanocobalamin  1,000 mcg Intramuscular Weekly  . docusate sodium  100 mg Oral Daily  . enoxaparin (LOVENOX) injection  40 mg Subcutaneous Q24H  . feeding supplement  237 mL Oral TID BM  . folic acid  1 mg Oral Daily  . hydrOXYzine  50 mg Oral QHS  . levETIRAcetam  500 mg Oral BID  . lithium carbonate  300 mg Oral BID WC  . multivitamin with minerals  1 tablet Oral Daily  . pantoprazole  40 mg Oral Daily  . senna  1 tablet Oral BID  . sodium chloride flush  10-40 mL Intracatheter Q12H  . thiamine  250 mg Oral Daily  . zinc sulfate  220 mg Oral Daily   Continuous Infusions: . sodium chloride Stopped (04/20/20 0858)  . magnesium sulfate bolus IVPB    . vancomycin 1,000 mg (04/25/20 0541)   PRN Meds:.[DISCONTINUED] acetaminophen **OR** acetaminophen, acetaminophen, albuterol, alum & mag hydroxide-simeth, bisacodyl, guaiFENesin-dextromethorphan, hydrALAZINE, HYDROcodone-acetaminophen, hydrOXYzine, labetalol, magnesium citrate, magnesium sulfate bolus IVPB, melatonin, metoprolol tartrate, ondansetron, oxyCODONE, phenol, sodium chloride flush, sodium chloride flush   PHYSICAL  EXAM: Vital signs: Vitals:   04/24/20 2119 04/25/20 0003 04/25/20 0400 04/25/20 0800  BP: (!) 96/50 100/63 102/60 110/68  Pulse: 80 75 70 78  Resp: 19 16 15 16   Temp: 98.1 F (36.7 C) 98 F (36.7 C) 98.2 F (36.8 C) 97.6 F (36.4 C)  TempSrc: Axillary Axillary Axillary Oral  SpO2: 95% 94% 96% 95%  Weight:      Height:       Filed Weights   04/15/20 1800  Weight: 71 kg   Body mass index is 21.23 kg/m.   Gen Exam:Alert awake-not in any distress HEENT:atraumatic, normocephalic Chest: B/L clear to auscultation anteriorly CVS:S1S2 regular Abdomen:soft non tender, non distended Extremities: Left BKA-site appears intact. Neurology: Non focal Skin: no rash  I have personally reviewed following labs and imaging studies  LABORATORY DATA: CBC: Recent Labs  Lab 04/19/20 0541 04/20/20 0018 04/21/20 0417 04/23/20 0244  WBC 9.7 10.3 10.1 9.5  HGB 10.5* 11.1* 10.6* 10.7*  HCT 31.7* 34.3* 31.6* 32.7*  MCV 96.9 99.4 97.2 96.7  PLT 401* 424* 459* 506*    Basic Metabolic Panel: Recent Labs  Lab 04/20/20 0018 04/21/20 0417 04/23/20 0244  NA 136 134* 136  K 4.6 4.4 4.2  CL 106 105 105  CO2 27 27 26   GLUCOSE 109* 100* 93  BUN 20 15 15   CREATININE 0.78 0.68 0.78  CALCIUM 8.2* 8.5* 8.5*  MG  --  2.0  --   PHOS  --  3.4  --     GFR: Estimated Creatinine Clearance: 96.1 mL/min (by C-G formula based on SCr of 0.78 mg/dL).  Liver Function Tests: Recent Labs  Lab 04/20/20 0018 04/23/20 0244  AST 33 29  ALT 19 24  ALKPHOS 51 48  BILITOT 0.6 0.8  PROT 4.7* 5.1*  ALBUMIN 1.9* 2.3*   No results for input(s): LIPASE, AMYLASE in the last 168 hours. No results for input(s): AMMONIA in the last 168 hours.  Coagulation Profile: No results for input(s): INR, PROTIME in the last 168 hours.  Cardiac Enzymes: No results for input(s): CKTOTAL, CKMB, CKMBINDEX, TROPONINI in the last 168 hours.  BNP (last 3 results) No results for input(s): PROBNP in the last 8760  hours.  Lipid Profile: No results for input(s): CHOL, HDL, LDLCALC, TRIG, CHOLHDL, LDLDIRECT in the last 72 hours.  Thyroid Function Tests: No results for input(s): TSH, T4TOTAL, FREET4, T3FREE, THYROIDAB in the last 72 hours.  Anemia Panel: No results for input(s): VITAMINB12, FOLATE, FERRITIN, TIBC, IRON, RETICCTPCT in the last 72 hours.  Urine analysis:    Component Value Date/Time   COLORURINE AMBER (A) 04/16/2020 0103   APPEARANCEUR CLEAR 04/16/2020 0103   LABSPEC 1.031 (H) 04/16/2020 0103   PHURINE 5.0 04/16/2020 0103   GLUCOSEU NEGATIVE 04/16/2020 0103   HGBUR NEGATIVE 04/16/2020 0103   BILIRUBINUR NEGATIVE 04/16/2020 0103   KETONESUR NEGATIVE 04/16/2020 0103   PROTEINUR 30 (A) 04/16/2020 0103   UROBILINOGEN 0.2 03/13/2013 1933   NITRITE NEGATIVE 04/16/2020 0103   LEUKOCYTESUR NEGATIVE 04/16/2020 0103    Sepsis Labs: Lactic Acid, Venous    Component Value Date/Time   LATICACIDVEN 1.1 04/17/2020 0407    MICROBIOLOGY: Recent Results (from the past 240 hour(s))  Blood culture (routine x 2)     Status: Abnormal   Collection Time: 04/15/20  6:07 PM   Specimen: BLOOD  Result Value Ref Range Status   Specimen Description BLOOD BLOOD LEFT FOREARM  Final   Special Requests   Final    BOTTLES DRAWN AEROBIC AND ANAEROBIC Blood Culture results may not be optimal due to an inadequate volume of blood received in culture bottles   Culture  Setup Time   Final    GRAM POSITIVE COCCI IN BOTH AEROBIC AND ANAEROBIC BOTTLES CRITICAL VALUE NOTED.  VALUE IS CONSISTENT WITH PREVIOUSLY REPORTED AND CALLED VALUE.    Culture (A)  Final    STAPHYLOCOCCUS AUREUS SUSCEPTIBILITIES PERFORMED ON PREVIOUS CULTURE WITHIN THE LAST 5 DAYS. Performed at [facility], [place] [address], [place], [place] [address]    Report Status 04/18/2020 FINAL  Final  Blood culture (routine x 2)     Status: Abnormal   Collection Time: 04/15/20  6:09 PM   Specimen: BLOOD LEFT HAND  Result Value Ref  Range Status   Specimen Description BLOOD LEFT HAND  Final   Special Requests   Final    BOTTLES DRAWN AEROBIC AND ANAEROBIC Blood Culture results may not be optimal due to an inadequate volume of blood received  in culture bottles   Culture  Setup Time   Final    GRAM POSITIVE COCCI IN BOTH AEROBIC AND ANAEROBIC BOTTLES CRITICAL RESULT CALLED TO, READ BACK BY AND VERIFIED WITH: PHARMD S.GRUBER AT 1219 ON 04/16/2020 BY T.SAAD Performed at [facility], [place] [address] [place], [place] [address]    Culture METHICILLIN RESISTANT STAPHYLOCOCCUS AUREUS (A)  Final   Report Status 04/18/2020 FINAL  Final   Organism ID, Bacteria METHICILLIN RESISTANT STAPHYLOCOCCUS AUREUS  Final      Susceptibility   Methicillin resistant staphylococcus aureus - MIC*    CIPROFLOXACIN >=8 RESISTANT Resistant     ERYTHROMYCIN >=8 RESISTANT Resistant     GENTAMICIN <=0.5 SENSITIVE Sensitive     OXACILLIN >=4 RESISTANT Resistant     TETRACYCLINE <=1 SENSITIVE Sensitive     VANCOMYCIN <=0.5 SENSITIVE Sensitive     TRIMETH/SULFA >=320 RESISTANT Resistant     CLINDAMYCIN <=0.25 SENSITIVE Sensitive     RIFAMPIN <=0.5 SENSITIVE Sensitive     Inducible Clindamycin NEGATIVE Sensitive     * METHICILLIN RESISTANT STAPHYLOCOCCUS AUREUS  Blood Culture ID Panel (Reflexed)     Status: Abnormal   Collection Time: 04/15/20  6:09 PM  Result Value Ref Range Status   Enterococcus faecalis NOT DETECTED NOT DETECTED Final   Enterococcus Faecium NOT DETECTED NOT DETECTED Final   Listeria monocytogenes NOT DETECTED NOT DETECTED Final   Staphylococcus species DETECTED (A) NOT DETECTED Final    Comment: CRITICAL RESULT CALLED TO, READ BACK BY AND VERIFIED WITH: PHAMD S.GRUBER AT 1219 ON 04/16/2020 BY T.SAAD.    Staphylococcus aureus (BCID) DETECTED (A) NOT DETECTED Final    Comment: Methicillin (oxacillin)-resistant Staphylococcus aureus (MRSA). MRSA is predictably resistant to beta-lactam antibiotics (except ceftaroline).  Preferred therapy is vancomycin unless clinically contraindicated. Patient requires contact precautions if  hospitalized. CRITICAL RESULT CALLED TO, READ BACK BY AND VERIFIED WITH: PHAMD S.GRUBER AT 1219 ON 04/16/2020 BY T.SAAD.    Staphylococcus epidermidis NOT DETECTED NOT DETECTED Final   Staphylococcus lugdunensis NOT DETECTED NOT DETECTED Final   Streptococcus species NOT DETECTED NOT DETECTED Final   Streptococcus agalactiae NOT DETECTED NOT DETECTED Final   Streptococcus pneumoniae NOT DETECTED NOT DETECTED Final   Streptococcus pyogenes NOT DETECTED NOT DETECTED Final   A.calcoaceticus-baumannii NOT DETECTED NOT DETECTED Final   Bacteroides fragilis NOT DETECTED NOT DETECTED Final   Enterobacterales NOT DETECTED NOT DETECTED Final   Enterobacter cloacae complex NOT DETECTED NOT DETECTED Final   Escherichia coli NOT DETECTED NOT DETECTED Final   Klebsiella aerogenes NOT DETECTED NOT DETECTED Final   Klebsiella oxytoca NOT DETECTED NOT DETECTED Final   Klebsiella pneumoniae NOT DETECTED NOT DETECTED Final   Proteus species NOT DETECTED NOT DETECTED Final   Salmonella species NOT DETECTED NOT DETECTED Final   Serratia marcescens NOT DETECTED NOT DETECTED Final   Haemophilus influenzae NOT DETECTED NOT DETECTED Final   Neisseria meningitidis NOT DETECTED NOT DETECTED Final   Pseudomonas aeruginosa NOT DETECTED NOT DETECTED Final   Stenotrophomonas maltophilia NOT DETECTED NOT DETECTED Final   Candida albicans NOT DETECTED NOT DETECTED Final   Candida auris NOT DETECTED NOT DETECTED Final   Candida glabrata NOT DETECTED NOT DETECTED Final   Candida krusei NOT DETECTED NOT DETECTED Final   Candida parapsilosis NOT DETECTED NOT DETECTED Final   Candida tropicalis NOT DETECTED NOT DETECTED Final   Cryptococcus neoformans/gattii NOT DETECTED NOT DETECTED Final   Meth resistant mecA/C and MREJ DETECTED (A) NOT DETECTED Final  Comment: CRITICAL RESULT CALLED TO, READ BACK BY AND  VERIFIED WITH: PHAMD S.GRUBER AT 1219 ON 04/16/2020 BY T.SAAD. Performed at [facility], [place] [address] [place], [place] [address]   Resp Panel by RT-PCR (Flu A&B, Covid) Nasopharyngeal Swab     Status: None   Collection Time: 04/15/20  6:26 PM   Specimen: Nasopharyngeal Swab; Nasopharyngeal(NP) swabs in vial transport medium  Result Value Ref Range Status   SARS Coronavirus 2 by RT PCR NEGATIVE NEGATIVE Final    Comment: (NOTE) SARS-CoV-2 target nucleic acids are NOT DETECTED.  The SARS-CoV-2 RNA is generally detectable in upper respiratory specimens during the acute phase of infection. The lowest concentration of SARS-CoV-2 viral copies this assay can detect is 138 copies/mL. A negative result does not preclude SARS-Cov-2 infection and should not be used as the sole basis for treatment or other patient management decisions. A negative result may occur with  improper specimen collection/handling, submission of specimen other than nasopharyngeal swab, presence of viral mutation(s) within the areas targeted by this assay, and inadequate number of viral copies(<138 copies/mL). A negative result must be combined with clinical observations, patient history, and epidemiological information. The expected result is Negative.  Fact Sheet for Patients:  EntrepreneurPulse.com.au  Fact Sheet for Healthcare Providers:  IncredibleEmployment.be  This test is no t yet approved or cleared by the [place] FDA and  has been authorized for detection and/or diagnosis of SARS-CoV-2 by FDA under an Emergency Use Authorization (EUA). This EUA will remain  in effect (meaning this test can be used) for the duration of the COVID-19 declaration under Section 564(b)(1) of the Act, 21 U.S.C.section 360bbb-3(b)(1), unless the authorization is terminated  or revoked sooner.       Influenza A by PCR NEGATIVE NEGATIVE Final   Influenza B by PCR NEGATIVE  NEGATIVE Final    Comment: (NOTE) The Xpert Xpress SARS-CoV-2/FLU/RSV plus assay is intended as an aid in the diagnosis of influenza from Nasopharyngeal swab specimens and should not be used as a sole basis for treatment. Nasal washings and aspirates are unacceptable for Xpert Xpress SARS-CoV-2/FLU/RSV testing.  Fact Sheet for Patients: EntrepreneurPulse.com.au  Fact Sheet for Healthcare Providers: IncredibleEmployment.be  This test is not yet approved or cleared by the [place] FDA and has been authorized for detection and/or diagnosis of SARS-CoV-2 by FDA under an Emergency Use Authorization (EUA). This EUA will remain in effect (meaning this test can be used) for the duration of the COVID-19 declaration under Section 564(b)(1) of the Act, 21 U.S.C. section 360bbb-3(b)(1), unless the authorization is terminated or revoked.  Performed at [facility], [place] [address], [place], [place] [address]   MRSA PCR Screening     Status: None   Collection Time: 04/16/20  8:58 PM   Specimen: Nasopharyngeal  Result Value Ref Range Status   MRSA by PCR NEGATIVE NEGATIVE Final    Comment:        The GeneXpert MRSA Assay (FDA approved for NASAL specimens only), is one component of a comprehensive MRSA colonization surveillance program. It is not intended to diagnose MRSA infection nor to guide or monitor treatment for MRSA infections. Performed at [facility], [place] [address] [place], [place] [address]   Culture, blood (routine x 2)     Status: None   Collection Time: 04/17/20  4:53 AM   Specimen: BLOOD LEFT HAND  Result Value Ref Range Status   Specimen Description BLOOD LEFT HAND  Final  Special Requests   Final    BOTTLES DRAWN AEROBIC AND ANAEROBIC Blood Culture adequate volume   Culture   Final    NO GROWTH 5 DAYS Performed at [facility], [place] [address] [place], [place] [address]    Report Status 04/22/2020 FINAL   Final  Culture, blood (routine x 2)     Status: None   Collection Time: 04/17/20  4:57 AM   Specimen: BLOOD  Result Value Ref Range Status   Specimen Description BLOOD LEFT ANTECUBITAL  Final   Special Requests   Final    BOTTLES DRAWN AEROBIC AND ANAEROBIC Blood Culture adequate volume   Culture   Final    NO GROWTH 5 DAYS Performed at [facility], [place] [address] [place], [place] [address]    Report Status 04/22/2020 FINAL  Final    RADIOLOGY STUDIES/RESULTS: [place] EKG SITE RITE  Result Date: 04/23/2020 If Site Rite image not attached, placement could not be confirmed due to current cardiac rhythm.    LOS: 10 days   Oren Binet, MD  [facility]    To contact the attending provider between 7A-7P or the covering provider during after hours 7P-7A, please log into the web site [email or web address] and access using universal Muse password for that web site. If you do not have the password, please call the hospital operator.  04/25/2020, 11:17 AM

## 2020-04-25 NOTE — Progress Notes (Signed)
PT Cancellation Note  Patient Details Name: Andrew Chaney MRN: [id number] DOB: 11-27-1955   Cancelled Treatment:    Reason Eval/Treat Not Completed: Patient declined, no reason specified.  Refusing therapy, will retry as time and pt allow.   Ramond Dial 04/25/2020, 8:30 AM   Mee Hives, PT MS Acute Rehab Dept. Number: [place] and [place]

## 2020-04-25 NOTE — Progress Notes (Signed)
Pharmacy Antibiotic Note  Andrew Chaney is a 65 y.o. male admitted on 04/15/2020 with MRSA bacteremia s/p L BKA.  Pharmacy has been consulted for Vancomycin dosing.  ID: MRSA bacteremia s/p L BKA, ?OM, also w/ ulcer on R hip no plan for surgical intervention. Chronic Hep C - TTE 4/7 w/o vegetation - TEE 4/11: no vegetations  - L BKA 4/6 - WBC 9.5. Afeb.   Linezolid 4/13>>4/13 Vanc 4/5 >> (missed doses 4/12 AM, , 4/13 AM, pulls out IV) Flagyl 4/5 > 4/7 Cefepime 4/5 > 4/7  4/8 - 4/9 VP 25; VT 12 - AUC 450  4/5 Bcx: GPC 4/4, MRSA 4/6: BCID: MRSA 4/7 Bcx: neg   Plan: Vanco 1g IV q12 hrs Repeat levels as soon as he'll keep IV in and back at [facility], hopefully this weekend. Plan for 2 weeks IV abx inpatient with discharge 30d Doxy       Height: 6' (182.9 cm) Weight: 71 kg (156 lb 8.4 oz) IBW/kg (Calculated) : 77.6  Temp (24hrs), Avg:98.1 F (36.7 C), Min:97.6 F (36.4 C), Max:98.2 F (36.8 C)  Recent Labs  Lab 04/18/20 2025 04/19/20 0541 04/20/20 0018 04/21/20 0417 04/23/20 0244  WBC  --  9.7 10.3 10.1 9.5  CREATININE  --   --  0.78 0.68 0.78  VANCOTROUGH  --  12*  --   --   --   VANCOPEAK 25*  --   --   --   --     Estimated Creatinine Clearance: 96.1 mL/min (by C-G formula based on SCr of 0.78 mg/dL).    No Known Allergies  Nataley Bahri S. Alford Highland, PharmD, BCPS Clinical Staff Pharmacist [email or web address]  [name] 04/25/2020 12:16 PM

## 2020-04-26 NOTE — Progress Notes (Signed)
Patient refuses to place brace on amputation.

## 2020-04-26 NOTE — Progress Notes (Signed)
PROGRESS NOTE        PATIENT DETAILS Name: Andrew Chaney Age: 65 y.o. Sex: male Date of Birth: 23-Jun-1955 Admit Date: 04/15/2020 Admitting Physician [name], MD [id number], No Pcp Per (Inactive)  Brief Narrative: Patient is a 65 y.o. male with history of bipolar disorder, MSSA bacteremia, chronic hepatitis C-who was diagnosed with left calcaneal fracture several months ago-homelessness-who was brought to the ED by [employer]-he was found to have osteomyelitis of his left foot-with septic shock-he was managed in the ICU by PCCM-upon stability-transfer to the [facility].  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  See below for further details.  Significant events: 4/5>> admit for left foot osteomyelitis 4/6>> septic shock-staph aureus bacteremia-PCCM consulted 4/6>> left BKA 4/11>> TEE  Significant studies: 4/5>> x-ray left foot: Comminuted fracture of the posterior calcaneus-osteomyelitis. 4/5>> chest x-ray: Right base atelectasis 4/6>> vitamin B12 level: 52 4/7>> TTE: EF 60-65% 4/11>> TEE: No vegetations, EF 60-65%  Antimicrobial therapy: Vancomycin: 4/5>> Flagyl: 4/5>> 4/6 Cefepime: 4/5>> 4/6  Microbiology data: 4/5>> blood culture: MRSA 4/7>> blood culture: No growth  Procedures : 4/6>> left BKA (Dr. Sharol Given) 4/11>> TEE: No vegetations, EF 60-65%  Consults: PCCM, orthopedics, infectious disease, psychiatry  DVT Prophylaxis : enoxaparin (LOVENOX) injection 40 mg Start: 04/17/20 1400 SCDs Start: 04/16/20 1249   Subjective: Lying comfortably in bed-sleeping-Per nursing staff-has refused some of his medications.  Does not want to put on his shrinker to his stump.  Refuses to get out of bed to chair.  Assessment/Plan: Septic shock due to MRSA bacteremia/left foot osteomyelitis with abscess involving his left foot: Sepsis physiology has resolved-remains on IV vancomycin-patient is now s/p left BKA.  TEE negative for vegetations.  Needs 2  weeks of antibiotics from date of negative blood cultures.  Right hip pressure ulcer: Due to slipping on concrete-patient home meds-continue wound care.  Bipolar disorder: Previously noncompliant with medications-evaluated by psychiatry-started on lithium  Seizure disorder: Continue Keppra  Vitamin B12 deficiency: Continue weekly IM supplementation.  Hyponatremia: Resolved  Normocytic anemia: Due to acute illness-chronic osteomyelitis-follow CBC periodically  Chronic hepatitis C: Per ID-plans are for outpatient treatment.  Nutrition Problem: Nutrition Problem: Severe Malnutrition Etiology: acute illness Signs/Symptoms: moderate fat depletion,moderate muscle depletion Interventions: Refer to RD note for recommendations   Diet: Diet Order            Diet regular Room service appropriate? Yes; Fluid consistency: Thin  Diet effective now                  Code Status: Full code   Family Communication: None at bedside   Disposition Plan: Status is: Inpatient  Remains inpatient appropriate because:Inpatient level of care appropriate due to severity of illness   Dispo: The patient is from: Home              Anticipated d/c is to: TBD              Patient currently is not medically stable to d/c.   Difficult to place patient Yes   Barriers to Discharge: MRSA bacteremia-on IV vancomycin-requires SNF placement  Antimicrobial agents: Anti-infectives (From admission, onward)   Start     Dose/Rate Route Frequency Ordered Stop   04/23/20 0130  linezolid (ZYVOX) tablet 600 mg  Status:  Discontinued        600 mg Oral Every 12 hours 04/23/20  1517 04/23/20 0854   04/16/20 0630  vancomycin (VANCOREADY) IVPB 1000 mg/200 mL       "Followed by" Linked Group Details   1,000 mg 200 mL/hr over 60 Minutes Intravenous Every 12 hours 04/15/20 1824     04/16/20 0200  metroNIDAZOLE (FLAGYL) IVPB 500 mg  Status:  Discontinued        500 mg 100 mL/hr over 60 Minutes Intravenous Every  8 hours 04/15/20 1858 04/17/20 0945   04/15/20 1830  ceFEPIme (MAXIPIME) 2 g in sodium chloride 0.9 % 100 mL IVPB  Status:  Discontinued        2 g 200 mL/hr over 30 Minutes Intravenous Every 8 hours 04/15/20 1824 04/17/20 0849   04/15/20 1830  vancomycin (VANCOREADY) IVPB 1500 mg/300 mL       "Followed by" Linked Group Details   1,500 mg 150 mL/hr over 120 Minutes Intravenous  Once 04/15/20 1824 04/16/20 0003   04/15/20 1745  metroNIDAZOLE (FLAGYL) IVPB 500 mg        500 mg 100 mL/hr over 60 Minutes Intravenous  Once 04/15/20 1737 04/15/20 1914       Time spent: 15- minutes-Greater than 50% of this time was spent in counseling, explanation of diagnosis, planning of further management, and coordination of care.  MEDICATIONS: Scheduled Meds: . (feeding supplement) PROSource Plus  30 mL Oral BID BM  . vitamin C  1,000 mg Oral Daily  . Chlorhexidine Gluconate Cloth  6 each Topical Daily  . cyanocobalamin  1,000 mcg Intramuscular Weekly  . docusate sodium  100 mg Oral Daily  . enoxaparin (LOVENOX) injection  40 mg Subcutaneous Q24H  . feeding supplement  237 mL Oral TID BM  . folic acid  1 mg Oral Daily  . hydrOXYzine  50 mg Oral QHS  . levETIRAcetam  500 mg Oral BID  . lithium carbonate  300 mg Oral BID WC  . multivitamin with minerals  1 tablet Oral Daily  . pantoprazole  40 mg Oral Daily  . senna  1 tablet Oral BID  . sodium chloride flush  10-40 mL Intracatheter Q12H  . thiamine  250 mg Oral Daily  . zinc sulfate  220 mg Oral Daily   Continuous Infusions: . sodium chloride Stopped (04/20/20 0858)  . magnesium sulfate bolus IVPB    . vancomycin 1,000 mg (04/26/20 0535)   PRN Meds:.[DISCONTINUED] acetaminophen **OR** acetaminophen, acetaminophen, albuterol, alum & mag hydroxide-simeth, bisacodyl, guaiFENesin-dextromethorphan, hydrALAZINE, HYDROcodone-acetaminophen, hydrOXYzine, labetalol, magnesium citrate, magnesium sulfate bolus IVPB, melatonin, metoprolol tartrate,  ondansetron, oxyCODONE, phenol, sodium chloride flush, sodium chloride flush   PHYSICAL EXAM: Vital signs: Vitals:   04/25/20 1633 04/25/20 2006 04/25/20 2351 04/26/20 0749  BP: (!) 93/57 121/76 110/67 103/63  Pulse: 89 100 89 83  Resp: 20 18 15 18   Temp: 98.2 F (36.8 C) 98 F (36.7 C) 98.1 F (36.7 C) 98 F (36.7 C)  TempSrc: Oral Axillary Axillary Axillary  SpO2: 95% 99% 97% 97%  Weight:      Height:       Filed Weights   04/15/20 1800  Weight: 71 kg   Body mass index is 21.23 kg/m.   Gen Exam:Alert awake-not in any distress HEENT:atraumatic, normocephalic Chest: B/L clear to auscultation anteriorly CVS:S1S2 regular Abdomen:soft non tender, non distended Extremities: Left BKA-site appears intact. Neurology: Non focal Skin: no rash  I have personally reviewed following labs and imaging studies  LABORATORY DATA: CBC: Recent Labs  Lab 04/20/20 0018 04/21/20 0417 04/23/20 0244  WBC 10.3 10.1 9.5  HGB 11.1* 10.6* 10.7*  HCT 34.3* 31.6* 32.7*  MCV 99.4 97.2 96.7  PLT 424* 459* 506*    Basic Metabolic Panel: Recent Labs  Lab 04/20/20 0018 04/21/20 0417 04/23/20 0244  NA 136 134* 136  K 4.6 4.4 4.2  CL 106 105 105  CO2 27 27 26   GLUCOSE 109* 100* 93  BUN 20 15 15   CREATININE 0.78 0.68 0.78  CALCIUM 8.2* 8.5* 8.5*  MG  --  2.0  --   PHOS  --  3.4  --     GFR: Estimated Creatinine Clearance: 96.1 mL/min (by C-G formula based on SCr of 0.78 mg/dL).  Liver Function Tests: Recent Labs  Lab 04/20/20 0018 04/23/20 0244  AST 33 29  ALT 19 24  ALKPHOS 51 48  BILITOT 0.6 0.8  PROT 4.7* 5.1*  ALBUMIN 1.9* 2.3*   No results for input(s): LIPASE, AMYLASE in the last 168 hours. No results for input(s): AMMONIA in the last 168 hours.  Coagulation Profile: No results for input(s): INR, PROTIME in the last 168 hours.  Cardiac Enzymes: No results for input(s): CKTOTAL, CKMB, CKMBINDEX, TROPONINI in the last 168 hours.  BNP (last 3 results) No  results for input(s): PROBNP in the last 8760 hours.  Lipid Profile: No results for input(s): CHOL, HDL, LDLCALC, TRIG, CHOLHDL, LDLDIRECT in the last 72 hours.  Thyroid Function Tests: No results for input(s): TSH, T4TOTAL, FREET4, T3FREE, THYROIDAB in the last 72 hours.  Anemia Panel: No results for input(s): VITAMINB12, FOLATE, FERRITIN, TIBC, IRON, RETICCTPCT in the last 72 hours.  Urine analysis:    Component Value Date/Time   COLORURINE AMBER (A) 04/16/2020 0103   APPEARANCEUR CLEAR 04/16/2020 0103   LABSPEC 1.031 (H) 04/16/2020 0103   PHURINE 5.0 04/16/2020 0103   GLUCOSEU NEGATIVE 04/16/2020 0103   HGBUR NEGATIVE 04/16/2020 0103   BILIRUBINUR NEGATIVE 04/16/2020 0103   KETONESUR NEGATIVE 04/16/2020 0103   PROTEINUR 30 (A) 04/16/2020 0103   UROBILINOGEN 0.2 03/13/2013 1933   NITRITE NEGATIVE 04/16/2020 0103   LEUKOCYTESUR NEGATIVE 04/16/2020 0103    Sepsis Labs: Lactic Acid, Venous    Component Value Date/Time   LATICACIDVEN 1.1 04/17/2020 0407    MICROBIOLOGY: Recent Results (from the past 240 hour(s))  MRSA PCR Screening     Status: None   Collection Time: 04/16/20  8:58 PM   Specimen: Nasopharyngeal  Result Value Ref Range Status   MRSA by PCR NEGATIVE NEGATIVE Final    Comment:        The GeneXpert MRSA Assay (FDA approved for NASAL specimens only), is one component of a comprehensive MRSA colonization surveillance program. It is not intended to diagnose MRSA infection nor to guide or monitor treatment for MRSA infections. Performed at [facility], [place] [address], [place], [place] [address]   Culture, blood (routine x 2)     Status: None   Collection Time: 04/17/20  4:53 AM   Specimen: BLOOD LEFT HAND  Result Value Ref Range Status   Specimen Description BLOOD LEFT HAND  Final   Special Requests   Final    BOTTLES DRAWN AEROBIC AND ANAEROBIC Blood Culture adequate volume   Culture   Final    NO GROWTH 5 DAYS Performed at [facility], [place] [address], [place], [place] [address]    Report Status 04/22/2020 FINAL  Final  Culture, blood (routine x 2)     Status: None   Collection Time:  04/17/20  4:57 AM   Specimen: BLOOD  Result Value Ref Range Status   Specimen Description BLOOD LEFT ANTECUBITAL  Final   Special Requests   Final    BOTTLES DRAWN AEROBIC AND ANAEROBIC Blood Culture adequate volume   Culture   Final    NO GROWTH 5 DAYS Performed at [facility], [address] [place],  [address]    Report Status 04/22/2020 FINAL  Final    RADIOLOGY STUDIES/RESULTS: No results found.   LOS: 11 days   Oren Binet, MD  [facility]    To contact the attending provider between 7A-7P or the covering provider during after hours 7P-7A, please log into the web site [email or web address] and access using universal [place] password for that web site. If you do not have the password, please call the hospital operator.  04/26/2020, 12:57 PM

## 2020-04-26 NOTE — Progress Notes (Signed)
Patient refuses to wear brace on amputation. MD is aware.

## 2020-04-27 MED ORDER — OXYCODONE HCL 5 MG PO TABS
5.0000 mg | ORAL_TABLET | Freq: Four times a day (QID) | ORAL | Status: DC | PRN
  Administered 2020-04-30 – 2020-05-02 (×4): 5 mg via ORAL
  Filled 2020-04-27 (×4): qty 1

## 2020-04-27 NOTE — Progress Notes (Signed)
Patient lying in bed alert and oriented. Patient refusing to wear brace on amputation and to get out of bed. Patient wants to be left alone. Administered medication. Patient reports no pain at this time. Will continue to monitor.

## 2020-04-27 NOTE — Progress Notes (Signed)
PROGRESS NOTE        PATIENT DETAILS Name: Andrew Chaney Age: 65 y.o. Sex: male Date of Birth: [birth year] Admit Date: 04/15/2020 Admitting Physician [name], MD [id number], No Pcp Per (Inactive)  Brief Narrative: Patient is a 65 y.o. male with history of bipolar disorder, MSSA bacteremia, chronic hepatitis C-who was diagnosed with left calcaneal fracture several months ago-homelessness-who was brought to the ED by [employer]-he was found to have osteomyelitis of his left foot-with septic shock-he was managed in the ICU by PCCM-upon stability-transfer to the [facility].  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  See below for further details.  Significant events: 4/5>> admit for left foot osteomyelitis 4/6>> septic shock-staph aureus bacteremia-PCCM consulted 4/6>> left BKA 4/11>> TEE  Significant studies: 4/5>> x-ray left foot: Comminuted fracture of the posterior calcaneus-osteomyelitis. 4/5>> chest x-ray: Right base atelectasis 4/6>> vitamin B12 level: 52 4/7>> TTE: EF 60-65% 4/11>> TEE: No vegetations, EF 60-65%  Antimicrobial therapy: Vancomycin: 4/5>> Flagyl: 4/5>> 4/6 Cefepime: 4/5>> 4/6  Microbiology data: 4/5>> blood culture: MRSA 4/7>> blood culture: No growth  Procedures : 4/6>> left BKA (Dr. Sharol Given) 4/11>> TEE: No vegetations, EF 60-65%  Consults: PCCM, orthopedics, infectious disease, psychiatry  DVT Prophylaxis : enoxaparin (LOVENOX) injection 40 mg Start: 04/17/20 1400 SCDs Start: 04/16/20 1249   Subjective: Continues to refuse care-does not want to wear brace/shrinker on the stump.  Lying comfortably in bed-Per nursing staff-mostly sleeping  Assessment/Plan: Septic shock due to MRSA bacteremia/left foot osteomyelitis with abscess involving his left foot: Sepsis physiology has resolved-remains on IV vancomycin-patient is now s/p left BKA.  TEE negative for vegetations.  Needs 2 weeks of antibiotics from date of negative  blood cultures.  Right hip pressure ulcer: Due to slipping on concrete-patient home meds-continue wound care.  Bipolar disorder: Previously noncompliant with medications-evaluated by psychiatry-started on lithium  Seizure disorder: Continue Keppra  Vitamin B12 deficiency: Continue weekly IM supplementation.  Hyponatremia: Resolved  Normocytic anemia: Due to acute illness-chronic osteomyelitis-follow CBC periodically  Chronic hepatitis C: Per ID-plans are for outpatient treatment.  Nutrition Problem: Nutrition Problem: Severe Malnutrition Etiology: acute illness Signs/Symptoms: moderate fat depletion,moderate muscle depletion Interventions: Refer to RD note for recommendations   Diet: Diet Order            Diet regular Room service appropriate? Yes with Assist; Fluid consistency: Thin  Diet effective now                  Code Status: Full code   Family Communication: None at bedside   Disposition Plan: Status is: Inpatient  Remains inpatient appropriate because:Inpatient level of care appropriate due to severity of illness   Dispo: The patient is from: Home              Anticipated d/c is to: TBD              Patient currently is not medically stable to d/c.   Difficult to place patient Yes   Barriers to Discharge: MRSA bacteremia-on IV vancomycin-requires SNF placement  Antimicrobial agents: Anti-infectives (From admission, onward)   Start     Dose/Rate Route Frequency Ordered Stop   04/23/20 0130  linezolid (ZYVOX) tablet 600 mg  Status:  Discontinued        600 mg Oral Every 12 hours 04/23/20 0039 04/23/20 0854   04/16/20 0630  vancomycin (VANCOREADY)  IVPB 1000 mg/200 mL       "Followed by" Linked Group Details   1,000 mg 200 mL/hr over 60 Minutes Intravenous Every 12 hours 04/15/20 1824     04/16/20 0200  metroNIDAZOLE (FLAGYL) IVPB 500 mg  Status:  Discontinued        500 mg 100 mL/hr over 60 Minutes Intravenous Every 8 hours 04/15/20 1858 04/17/20  0945   04/15/20 1830  ceFEPIme (MAXIPIME) 2 g in sodium chloride 0.9 % 100 mL IVPB  Status:  Discontinued        2 g 200 mL/hr over 30 Minutes Intravenous Every 8 hours 04/15/20 1824 04/17/20 0849   04/15/20 1830  vancomycin (VANCOREADY) IVPB 1500 mg/300 mL       "Followed by" Linked Group Details   1,500 mg 150 mL/hr over 120 Minutes Intravenous  Once 04/15/20 1824 04/16/20 0003   04/15/20 1745  metroNIDAZOLE (FLAGYL) IVPB 500 mg        500 mg 100 mL/hr over 60 Minutes Intravenous  Once 04/15/20 1737 04/15/20 1914       Time spent: 15- minutes-Greater than 50% of this time was spent in counseling, explanation of diagnosis, planning of further management, and coordination of care.  MEDICATIONS: Scheduled Meds: . (feeding supplement) PROSource Plus  30 mL Oral BID BM  . vitamin C  1,000 mg Oral Daily  . Chlorhexidine Gluconate Cloth  6 each Topical Daily  . cyanocobalamin  1,000 mcg Intramuscular Weekly  . docusate sodium  100 mg Oral Daily  . enoxaparin (LOVENOX) injection  40 mg Subcutaneous Q24H  . feeding supplement  237 mL Oral TID BM  . folic acid  1 mg Oral Daily  . hydrOXYzine  50 mg Oral QHS  . levETIRAcetam  500 mg Oral BID  . lithium carbonate  300 mg Oral BID WC  . multivitamin with minerals  1 tablet Oral Daily  . pantoprazole  40 mg Oral Daily  . senna  1 tablet Oral BID  . sodium chloride flush  10-40 mL Intracatheter Q12H  . thiamine  250 mg Oral Daily  . zinc sulfate  220 mg Oral Daily   Continuous Infusions: . sodium chloride Stopped (04/20/20 0858)  . magnesium sulfate bolus IVPB    . vancomycin 1,000 mg (04/27/20 0546)   PRN Meds:.[DISCONTINUED] acetaminophen **OR** acetaminophen, acetaminophen, albuterol, alum & mag hydroxide-simeth, bisacodyl, guaiFENesin-dextromethorphan, hydrALAZINE, HYDROcodone-acetaminophen, hydrOXYzine, labetalol, magnesium citrate, magnesium sulfate bolus IVPB, melatonin, metoprolol tartrate, ondansetron, oxyCODONE, phenol, sodium  chloride flush, sodium chloride flush   PHYSICAL EXAM: Vital signs: Vitals:   04/26/20 2008 04/27/20 0014 04/27/20 0436 04/27/20 0737  BP: 118/69 119/71 99/73 102/66  Pulse: 89 81 88 82  Resp: 17 18 19    Temp: 98.3 F (36.8 C) 98 F (36.7 C) 98.1 F (36.7 C) 98.1 F (36.7 C)  TempSrc: Axillary Axillary Axillary Axillary  SpO2: 96% 94% 93% 98%  Weight:      Height:       Filed Weights   04/15/20 1800  Weight: 71 kg   Body mass index is 21.23 kg/m.   Gen Exam:Alert awake-not in any distress HEENT:atraumatic, normocephalic Chest: B/L clear to auscultation anteriorly CVS:S1S2 regular Abdomen:soft non tender, non distended Extremities: Left BKA-site appears intact. Neurology: Non focal Skin: no rash  I have personally reviewed following labs and imaging studies  LABORATORY DATA: CBC: Recent Labs  Lab 04/21/20 0417 04/23/20 0244  WBC 10.1 9.5  HGB 10.6* 10.7*  HCT 31.6* 32.7*  MCV  97.2 96.7  PLT 459* 506*    Basic Metabolic Panel: Recent Labs  Lab 04/21/20 0417 04/23/20 0244  NA 134* 136  K 4.4 4.2  CL 105 105  CO2 27 26  GLUCOSE 100* 93  BUN 15 15  CREATININE 0.68 0.78  CALCIUM 8.5* 8.5*  MG 2.0  --   PHOS 3.4  --     GFR: Estimated Creatinine Clearance: 96.1 mL/min (by C-G formula based on SCr of 0.78 mg/dL).  Liver Function Tests: Recent Labs  Lab 04/23/20 0244  AST 29  ALT 24  ALKPHOS 48  BILITOT 0.8  PROT 5.1*  ALBUMIN 2.3*   No results for input(s): LIPASE, AMYLASE in the last 168 hours. No results for input(s): AMMONIA in the last 168 hours.  Coagulation Profile: No results for input(s): INR, PROTIME in the last 168 hours.  Cardiac Enzymes: No results for input(s): CKTOTAL, CKMB, CKMBINDEX, TROPONINI in the last 168 hours.  BNP (last 3 results) No results for input(s): PROBNP in the last 8760 hours.  Lipid Profile: No results for input(s): CHOL, HDL, LDLCALC, TRIG, CHOLHDL, LDLDIRECT in the last 72 hours.  Thyroid  Function Tests: No results for input(s): TSH, T4TOTAL, FREET4, T3FREE, THYROIDAB in the last 72 hours.  Anemia Panel: No results for input(s): VITAMINB12, FOLATE, FERRITIN, TIBC, IRON, RETICCTPCT in the last 72 hours.  Urine analysis:    Component Value Date/Time   COLORURINE AMBER (A) 04/16/2020 0103   APPEARANCEUR CLEAR 04/16/2020 0103   LABSPEC 1.031 (H) 04/16/2020 0103   PHURINE 5.0 04/16/2020 0103   GLUCOSEU NEGATIVE 04/16/2020 0103   HGBUR NEGATIVE 04/16/2020 0103   BILIRUBINUR NEGATIVE 04/16/2020 0103   KETONESUR NEGATIVE 04/16/2020 0103   PROTEINUR 30 (A) 04/16/2020 0103   UROBILINOGEN 0.2 03/13/2013 1933   NITRITE NEGATIVE 04/16/2020 0103   LEUKOCYTESUR NEGATIVE 04/16/2020 0103    Sepsis Labs: Lactic Acid, Venous    Component Value Date/Time   LATICACIDVEN 1.1 04/17/2020 0407    MICROBIOLOGY: No results found for this or any previous visit (from the past 240 hour(s)).  RADIOLOGY STUDIES/RESULTS: No results found.   LOS: 12 days   Oren Binet, MD  [facility]    To contact the attending provider between 7A-7P or the covering provider during after hours 7P-7A, please log into the web site [email or web address] and access using universal [place] password for that web site. If you do not have the password, please call the hospital operator.  04/27/2020, 11:36 AM

## 2020-04-28 LAB — VANCOMYCIN, PEAK: Vancomycin Pk: 14 ug/mL — ABNORMAL LOW (ref 30–40)

## 2020-04-28 MED ORDER — VANCOMYCIN HCL 1000 MG/200ML IV SOLN
1000.0000 mg | Freq: Three times a day (TID) | INTRAVENOUS | Status: DC
  Administered 2020-04-28 – 2020-04-29 (×3): 1000 mg via INTRAVENOUS
  Filled 2020-04-28 (×4): qty 200

## 2020-04-28 NOTE — Progress Notes (Signed)
PROGRESS NOTE        PATIENT DETAILS Name: Andrew Chaney Age: 65 y.o. Sex: male Date of Birth: Dec 24, 1955 Admit Date: 04/15/2020 Admitting Physician [name], MD OVZ:CHYIFOY, No Pcp Per (Inactive)  Brief Narrative: Patient is a 65 y.o. male with history of bipolar disorder, MSSA bacteremia, chronic hepatitis C-who was diagnosed with left calcaneal fracture several months ago-homelessness-who was brought to the ED by [facility]-he was found to have osteomyelitis of his left foot-with septic shock-he was managed in the ICU by PCCM-upon stability-transfer to the [facility].  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  See below for further details.  Significant events: 4/5>> admit for left foot osteomyelitis 4/6>> septic shock-staph aureus bacteremia-PCCM consulted 4/6>> left BKA 4/11>> TEE  Significant studies: 4/5>> x-ray left foot: Comminuted fracture of the posterior calcaneus-osteomyelitis. 4/5>> chest x-ray: Right base atelectasis 4/6>> vitamin B12 level: 52 4/7>> TTE: EF 60-65% 4/11>> TEE: No vegetations, EF 60-65%  Antimicrobial therapy: Vancomycin: 4/5>> Flagyl: 4/5>> 4/6 Cefepime: 4/5>> 4/6  Microbiology data: 4/5>> blood culture: MRSA 4/7>> blood culture: No growth  Procedures : 4/6>> left BKA (Dr. Sharol Given) 4/11>> TEE: No vegetations, EF 60-65%  Consults: PCCM, orthopedics, infectious disease, psychiatry  DVT Prophylaxis : enoxaparin (LOVENOX) injection 40 mg Start: 04/17/20 1400 SCDs Start: 04/16/20 1249   Subjective: No chest pain or shortness of breath.  Assessment/Plan: Septic shock due to MRSA bacteremia/left foot osteomyelitis with abscess involving his left foot: Sepsis physiology has resolved-remains on IV vancomycin-patient is now s/p left BKA.  TEE negative for vegetations.  Needs 2 weeks of antibiotics from date of negative blood cultures.  Right hip pressure ulcer: Due to slipping on concrete-patient home  meds-continue wound care.  Bipolar disorder: Previously noncompliant with medications-evaluated by psychiatry-started on lithium  Seizure disorder: Continue Keppra  Vitamin B12 deficiency: Continue weekly IM supplementation.  Hyponatremia: Resolved  Normocytic anemia: Due to acute illness-chronic osteomyelitis-follow CBC periodically  Chronic hepatitis C: Per ID-plans are for outpatient treatment.  Nutrition Problem: Nutrition Problem: Severe Malnutrition Etiology: acute illness Signs/Symptoms: moderate fat depletion,moderate muscle depletion Interventions: Refer to RD note for recommendations   Diet: Diet Order            Diet regular Room service appropriate? Yes with Assist; Fluid consistency: Thin  Diet effective now                  Code Status: Full code   Family Communication: None at bedside   Disposition Plan: Status is: Inpatient  Remains inpatient appropriate because:Inpatient level of care appropriate due to severity of illness   Dispo: The patient is from: Home              Anticipated d/c is to: TBD              Patient currently is not medically stable to d/c.   Difficult to place patient Yes   Barriers to Discharge: MRSA bacteremia-on IV vancomycin-requires SNF placement  Antimicrobial agents: Anti-infectives (From admission, onward)   Start     Dose/Rate Route Frequency Ordered Stop   04/23/20 0130  linezolid (ZYVOX) tablet 600 mg  Status:  Discontinued        600 mg Oral Every 12 hours 04/23/20 0039 04/23/20 0854   04/16/20 0630  vancomycin (VANCOREADY) IVPB 1000 mg/200 mL       "Followed by" Linked  Group Details   1,000 mg 200 mL/hr over 60 Minutes Intravenous Every 12 hours 04/15/20 1824     04/16/20 0200  metroNIDAZOLE (FLAGYL) IVPB 500 mg  Status:  Discontinued        500 mg 100 mL/hr over 60 Minutes Intravenous Every 8 hours 04/15/20 1858 04/17/20 0945   04/15/20 1830  ceFEPIme (MAXIPIME) 2 g in sodium chloride 0.9 % 100 mL IVPB   Status:  Discontinued        2 g 200 mL/hr over 30 Minutes Intravenous Every 8 hours 04/15/20 1824 04/17/20 0849   04/15/20 1830  vancomycin (VANCOREADY) IVPB 1500 mg/300 mL       "Followed by" Linked Group Details   1,500 mg 150 mL/hr over 120 Minutes Intravenous  Once 04/15/20 1824 04/16/20 0003   04/15/20 1745  metroNIDAZOLE (FLAGYL) IVPB 500 mg        500 mg 100 mL/hr over 60 Minutes Intravenous  Once 04/15/20 1737 04/15/20 1914       Time spent: 15- minutes-Greater than 50% of this time was spent in counseling, explanation of diagnosis, planning of further management, and coordination of care.  MEDICATIONS: Scheduled Meds: . (feeding supplement) PROSource Plus  30 mL Oral BID BM  . vitamin C  1,000 mg Oral Daily  . Chlorhexidine Gluconate Cloth  6 each Topical Daily  . cyanocobalamin  1,000 mcg Intramuscular Weekly  . docusate sodium  100 mg Oral Daily  . enoxaparin (LOVENOX) injection  40 mg Subcutaneous Q24H  . feeding supplement  237 mL Oral TID BM  . folic acid  1 mg Oral Daily  . hydrOXYzine  50 mg Oral QHS  . levETIRAcetam  500 mg Oral BID  . lithium carbonate  300 mg Oral BID WC  . multivitamin with minerals  1 tablet Oral Daily  . pantoprazole  40 mg Oral Daily  . senna  1 tablet Oral BID  . sodium chloride flush  10-40 mL Intracatheter Q12H  . thiamine  250 mg Oral Daily  . zinc sulfate  220 mg Oral Daily   Continuous Infusions: . sodium chloride Stopped (04/20/20 0858)  . magnesium sulfate bolus IVPB    . vancomycin 1,000 mg (04/28/20 1028)   PRN Meds:.[DISCONTINUED] acetaminophen **OR** acetaminophen, albuterol, alum & mag hydroxide-simeth, bisacodyl, guaiFENesin-dextromethorphan, hydrALAZINE, hydrOXYzine, labetalol, magnesium citrate, magnesium sulfate bolus IVPB, melatonin, metoprolol tartrate, ondansetron, oxyCODONE, phenol, sodium chloride flush, sodium chloride flush   PHYSICAL EXAM: Vital signs: Vitals:   04/27/20 2006 04/28/20 0006 04/28/20 0347  04/28/20 0807  BP: 95/63 103/67 95/60 105/60  Pulse: 89 77 80 78  Resp: 20 18 17 17   Temp: 99.3 F (37.4 C) 98 F (36.7 C) 99 F (37.2 C) 98.7 F (37.1 C)  TempSrc: Oral Oral Oral Oral  SpO2: 95% 97% 98% 98%  Weight:      Height:       Filed Weights   04/15/20 1800  Weight: 71 kg   Body mass index is 21.23 kg/m.   Gen Exam:Alert awake-not in any distress HEENT:atraumatic, normocephalic Chest: B/L clear to auscultation anteriorly CVS:S1S2 regular Abdomen:soft non tender, non distended Extremities: Left BKA-site appears intact. Neurology: Non focal Skin: no rash  I have personally reviewed following labs and imaging studies  LABORATORY DATA: CBC: Recent Labs  Lab 04/23/20 0244  WBC 9.5  HGB 10.7*  HCT 32.7*  MCV 96.7  PLT 506*    Basic Metabolic Panel: Recent Labs  Lab 04/23/20 0244  NA 136  K 4.2  CL 105  CO2 26  GLUCOSE 93  BUN 15  CREATININE 0.78  CALCIUM 8.5*    GFR: Estimated Creatinine Clearance: 96.1 mL/min (by C-G formula based on SCr of 0.78 mg/dL).  Liver Function Tests: Recent Labs  Lab 04/23/20 0244  AST 29  ALT 24  ALKPHOS 48  BILITOT 0.8  PROT 5.1*  ALBUMIN 2.3*   No results for input(s): LIPASE, AMYLASE in the last 168 hours. No results for input(s): AMMONIA in the last 168 hours.  Coagulation Profile: No results for input(s): INR, PROTIME in the last 168 hours.  Cardiac Enzymes: No results for input(s): CKTOTAL, CKMB, CKMBINDEX, TROPONINI in the last 168 hours.  BNP (last 3 results) No results for input(s): PROBNP in the last 8760 hours.  Lipid Profile: No results for input(s): CHOL, HDL, LDLCALC, TRIG, CHOLHDL, LDLDIRECT in the last 72 hours.  Thyroid Function Tests: No results for input(s): TSH, T4TOTAL, FREET4, T3FREE, THYROIDAB in the last 72 hours.  Anemia Panel: No results for input(s): VITAMINB12, FOLATE, FERRITIN, TIBC, IRON, RETICCTPCT in the last 72 hours.  Urine analysis:    Component Value  Date/Time   COLORURINE AMBER (A) 04/16/2020 0103   APPEARANCEUR CLEAR 04/16/2020 0103   LABSPEC 1.031 (H) 04/16/2020 0103   PHURINE 5.0 04/16/2020 0103   GLUCOSEU NEGATIVE 04/16/2020 0103   HGBUR NEGATIVE 04/16/2020 0103   BILIRUBINUR NEGATIVE 04/16/2020 0103   KETONESUR NEGATIVE 04/16/2020 0103   PROTEINUR 30 (A) 04/16/2020 0103   UROBILINOGEN 0.2 03/13/2013 1933   NITRITE NEGATIVE 04/16/2020 0103   LEUKOCYTESUR NEGATIVE 04/16/2020 0103    Sepsis Labs: Lactic Acid, Venous    Component Value Date/Time   LATICACIDVEN 1.1 04/17/2020 0407    MICROBIOLOGY: No results found for this or any previous visit (from the past 240 hour(s)).  RADIOLOGY STUDIES/RESULTS: No results found.   LOS: 13 days   Oren Binet, MD  [facility]    To contact the attending provider between 7A-7P or the covering provider during after hours 7P-7A, please log into the web site [email or web address] and access using universal [place] password for that web site. If you do not have the password, please call the hospital operator.  04/28/2020, 11:33 AM

## 2020-04-28 NOTE — Progress Notes (Signed)
Pharmacy Antibiotic Note  Andrew Chaney is a 65 y.o. male admitted on 04/15/2020 with MRSA bacteremia s/p L BKA.  Pharmacy has been consulted for Vancomycin dosing.  ID: MRSA bacteremia s/p L BKA, ?OM, also w/ ulcer on R hip no plan for surgical intervention. Chronic Hep C - TTE 4/7 w/o vegetation - TEE 4/11: no vegetations  - L BKA 4/6 - WBC 9.5. Afeb.   Linezolid 4/13>>4/13 Vanc 4/5 >> (missed doses 4/12 AM, , 4/13 AM, pulls out IV) Flagyl 4/5 > 4/7 Cefepime 4/5 > 4/7  4/8 - 4/9 VP 25; VT 12 - AUC 450 4/18 VP 14  4/5 Bcx: GPC 4/4, MRSA 4/6: BCID: MRSA 4/7 Bcx: neg  Vanc peak came back only 14 today. We will go ahead and adjust the dose rather than do a trough tonight. He is on D13 of vanc now. Still pending discharge. Renal function still stable.   Plan: Change vanc to 1g IV q8 We will get more levels if he is here longer Plan for 2 weeks IV abx inpatient with discharge 30d Doxy    Height: 6' (182.9 cm) Weight: 71 kg (156 lb 8.4 oz) IBW/kg (Calculated) : 77.6  Temp (24hrs), Avg:98.5 F (36.9 C), Min:98 F (36.7 C), Max:99.3 F (37.4 C)  Recent Labs  Lab 04/23/20 0244 04/28/20 1231  WBC 9.5  --   CREATININE 0.78  --   VANCOPEAK  --  14*    Estimated Creatinine Clearance: 96.1 mL/min (by C-G formula based on SCr of 0.78 mg/dL).    No Known Allergies  Onnie Boer, PharmD, BCIDP, AAHIVP, CPP Infectious Disease Pharmacist 04/28/2020 3:39 PM

## 2020-04-28 NOTE — Progress Notes (Signed)
Patient moved to chair, but refuses to have brace placed on amputated limb. MD is aware.

## 2020-04-28 NOTE — Progress Notes (Signed)
Physical Therapy Treatment Patient Details Name: Andrew Chaney MRN: [id number] DOB: Mar 22, 1955 Today's Date: 04/28/2020    History of Present Illness Pt is 65 y.o. male who presented on 04/15/20 with left foot pain (recent calcaneus fx) with purulent drainage and rt lateral hip pressure ulcer. 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  Pt with medical history significant of bipolar disorder homelessness recent calcaneus fracture, history of MSSA bacteremia, cannabis abuse, seizure disorder, psychosis, chronic hepatitis C    PT Comments    Pt sitting up in chair, initially refusing to work with therapy because RN promised that he could get back to bed in 13 minutes. Therapist able to convince him to participate in limited seated exercise, but as time got closer to 13 minutes time pt stopped exercise. Pt refuses to stand or ambulate. Pt ultimately min guard for lateral scoot to from drop arm recliner to bed. D/c plans remain appropriate at this time. PT will continue to follow acutely.     Follow Up Recommendations  SNF     Equipment Recommendations  Wheelchair (measurements PT);Wheelchair cushion (measurements PT)       Precautions / Restrictions Precautions Precautions: Fall Required Braces or Orthoses: Other Brace Other Brace: residual limb protector (Pt continues to adamantly refused use) Restrictions Weight Bearing Restrictions: Yes LLE Weight Bearing: Non weight bearing    Mobility  Bed Mobility Overal bed mobility: Needs Assistance Bed Mobility: Sit to Supine       Sit to supine: Min guard   General bed mobility comments: Assist for safety    Transfers Overall transfer level: Needs assistance Equipment used: Rolling walker (2 wheeled) Transfers: Sit to/from Stand;Lateral/Scoot Transfers          Lateral/Scoot Transfers: Min guard General transfer comment: lateral scoot from drop arm recliner to bed, immediately lays back in bed, rolls on side,  pulls blankets up and stops reponding to therapist  Ambulation/Gait             General Gait Details: refused any ambulation         Balance Overall balance assessment: Needs assistance Sitting-balance support: Feet supported;No upper extremity supported Sitting balance-Leahy Scale: Fair                                      Cognition Arousal/Alertness: Awake/alert Behavior During Therapy: Anxious Overall Cognitive Status: No family/caregiver present to determine baseline cognitive functioning Area of Impairment: Memory;Safety/judgement;Problem solving                     Memory: Decreased short-term memory   Safety/Judgement: Decreased awareness of safety   Problem Solving: Slow processing;Requires tactile cues;Requires verbal cues General Comments: Pt continues to require maximal encouragement to participate in therapy, only willing to participate if he will end up in bed at end of session      Exercises General Exercises - Lower Extremity Quad Sets: AROM;Both;10 reps;Seated Long Arc Quad: AROM;Left;10 reps Hip Flexion/Marching: AROM;Both;10 reps    General Comments General comments (skin integrity, edema, etc.): VSS      Pertinent Vitals/Pain Pain Assessment: Faces Faces Pain Scale: Hurts little more Pain Location: LLE with movement Pain Descriptors / Indicators: Operative site guarding;Grimacing Pain Intervention(s): Limited activity within patient's tolerance;Repositioned           PT Goals (current goals can now be found in the care plan section) Acute  Rehab PT Goals Patient Stated Goal: decrease pain PT Goal Formulation: With patient Time For Goal Achievement: 05/01/20 Potential to Achieve Goals: Good Progress towards PT goals: Not progressing toward goals - comment    Frequency    Min 4X/week      PT Plan Current plan remains appropriate       AM-PAC PT "6 Clicks" Mobility   Outcome Measure  Help needed  turning from your back to your side while in a flat bed without using bedrails?: None Help needed moving from lying on your back to sitting on the side of a flat bed without using bedrails?: A Little Help needed moving to and from a bed to a chair (including a wheelchair)?: A Little Help needed standing up from a chair using your arms (e.g., wheelchair or bedside chair)?: Total Help needed to walk in hospital room?: Total Help needed climbing 3-5 steps with a railing? : Total 6 Click Score: 13    End of Session Equipment Utilized During Treatment: Gait belt Activity Tolerance: Patient tolerated treatment well (Pt with decreased participation) Patient left: with call bell/phone within reach;in chair;with chair alarm set (limb protector in place) Nurse Communication: Mobility status PT Visit Diagnosis: Difficulty in walking, not elsewhere classified (R26.2);Pain Pain - Right/Left: Left Pain - part of body: Leg     Time: 1343-1355 PT Time Calculation (min) (ACUTE ONLY): 12 min  Charges:  $Therapeutic Exercise: 8-22 mins                     Yvonda Fouty B. Migdalia Dk PT, DPT Acute Rehabilitation Services Pager [phone] Office [phone]    [place] 04/28/2020, 3:35 PM

## 2020-04-29 DIAGNOSIS — Z89512 Acquired absence of left leg below knee: Secondary | ICD-10-CM

## 2020-04-29 DIAGNOSIS — L089 Local infection of the skin and subcutaneous tissue, unspecified: Secondary | ICD-10-CM

## 2020-04-29 DIAGNOSIS — F1990 Other psychoactive substance use, unspecified, uncomplicated: Secondary | ICD-10-CM

## 2020-04-29 LAB — BASIC METABOLIC PANEL
Anion gap: 8 (ref 5–15)
BUN: <5 mg/dL — ABNORMAL LOW (ref 8–23)
CO2: 28 mmol/L (ref 22–32)
Calcium: 8.3 mg/dL — ABNORMAL LOW (ref 8.9–10.3)
Chloride: 95 mmol/L — ABNORMAL LOW (ref 98–111)
Creatinine, Ser: 0.58 mg/dL — ABNORMAL LOW (ref 0.61–1.24)
GFR, Estimated: >60 mL/min (ref >60)
Glucose, Bld: 170 mg/dL — ABNORMAL HIGH (ref 70–99)
Potassium: 3 mmol/L — ABNORMAL LOW (ref 3.5–5.1)
Sodium: 131 mmol/L — ABNORMAL LOW (ref 135–145)

## 2020-04-29 LAB — CBC
HCT: 38.5 % — ABNORMAL LOW (ref 39.0–52.0)
Hemoglobin: 13 g/dL (ref 13.0–17.0)
MCH: 29 pg (ref 26.0–34.0)
MCHC: 33.8 g/dL (ref 30.0–36.0)
MCV: 85.7 fL (ref 80.0–100.0)
Platelets: 177 10*3/uL (ref 150–400)
RBC: 4.49 MIL/uL (ref 4.22–5.81)
RDW: 12.8 % (ref 11.5–15.5)
WBC: 11.8 10*3/uL — ABNORMAL HIGH (ref 4.0–10.5)
nRBC: 0 % (ref 0.0–0.2)

## 2020-04-29 MED ORDER — PREGABALIN 75 MG PO CAPS
75.0000 mg | ORAL_CAPSULE | Freq: Three times a day (TID) | ORAL | Status: DC
  Administered 2020-04-29 – 2020-04-30 (×4): 75 mg via ORAL
  Filled 2020-04-29 (×4): qty 1

## 2020-04-29 MED ORDER — POTASSIUM CHLORIDE CRYS ER 20 MEQ PO TBCR
40.0000 meq | EXTENDED_RELEASE_TABLET | Freq: Once | ORAL | Status: AC
  Administered 2020-04-29: 40 meq via ORAL
  Filled 2020-04-29: qty 2

## 2020-04-29 MED ORDER — SODIUM CHLORIDE 0.9 % IV SOLN
500.0000 mg | Freq: Every day | INTRAVENOUS | Status: DC
  Administered 2020-04-29 – 2020-04-30 (×2): 500 mg via INTRAVENOUS
  Filled 2020-04-29 (×3): qty 10

## 2020-04-29 NOTE — Progress Notes (Signed)
Patient refused dressing change and orthostatic VS. Attempted multiple times throughout the shift and patient just states "No thank you." Patient also encouraged to participate with mobility with therapy but declined. Currently resting comfortably in bed and denies pain.

## 2020-04-29 NOTE — Progress Notes (Signed)
Pharmacy Antibiotic Note  Md Smola is a 65 y.o. male admitted on 04/15/2020 with MRSA bacteremia s/p L BKA.  Pharmacy has been consulted for Vancomycin dosing.  ID: MRSA bacteremia s/p L BKA, ?OM, also w/ ulcer on R hip no plan for surgical intervention. Chronic Hep C - TTE 4/7 w/o vegetation - TEE 4/11: no vegetations  - L BKA 4/6 - WBC 9.5. Afeb.   Linezolid 4/13>>4/13 Vanc 4/5 >> (missed doses 4/12 AM, , 4/13 AM, pulls out IV) Flagyl 4/5 > 4/7 Cefepime 4/5 > 4/7  4/8 - 4/9 VP 25; VT 12 - AUC 450 4/18 VP 14  4/5 Bcx: GPC 4/4, MRSA 4/6: BCID: MRSA 4/7 Bcx: neg  Vanc peak came back only 14 today. We will go ahead and adjust the dose rather than do a trough tonight. He is on D13 of vanc now. Still pending discharge. Renal function still stable.   Pt is still waiting for coverage approval and discharge. Spoke with Dr. Tommy Medal and we will use Dapto instead of vanc while he is here then we will finish up the course with PO doxy at discharge.  Plan: Dc vanc Daptomycin 500mg  IV q24 CK in AM then weekly Plan for at least 2 weeks IV abx inpatient with discharge 30d Doxy    Height: 6' (182.9 cm) Weight: 71 kg (156 lb 8.4 oz) IBW/kg (Calculated) : 77.6  Temp (24hrs), Avg:98.6 F (37 C), Min:98.3 F (36.8 C), Max:98.8 F (37.1 C)  Recent Labs  Lab 04/23/20 0244 04/28/20 1231 04/29/20 0106  WBC 9.5  --  11.8*  CREATININE 0.78  --  0.58*  VANCOPEAK  --  14*  --     Estimated Creatinine Clearance: 96.1 mL/min (A) (by C-G formula based on SCr of 0.58 mg/dL (L)).    No Known Allergies  Onnie Boer, PharmD, BCIDP, AAHIVP, CPP Infectious Disease Pharmacist 04/29/2020 12:56 PM

## 2020-04-29 NOTE — Progress Notes (Addendum)
[facility] PROGRESS NOTE  Andrew Chaney GYI:[id number] DOB: [birth year] DOA: 04/15/2020 PCP: Patient, No Pcp Per (Inactive)  Status:  Remains inpatient appropriate because:Ongoing active pain requiring inpatient pain management, Unsafe d/c plan and IV treatments appropriate due to intensity of illness or inability to take PO   Dispo: The patient is from: Homeless              Anticipated d/c is to: SNF vs homeless shelter              Patient currently is not medically stable to d/c.   Difficult to place patient Yes              Barriers to DC: No insurance-inconsistent participation w/ therapy   Level of care: Progressive  Code Status: FULL Family Communication: Patient DVT prophylaxis: Lovenox Vaccination status: Unknown  Foley catheter: No  HPI: 65 y.o. male with history of bipolar disorder, MSSA bacteremia, chronic hepatitis C-who was diagnosed with left calcaneal fracture several months ago-homelessness-who was brought to the ED by [employer]-he was found to have osteomyelitis of his left foot-with septic shock-he was managed in the ICU by PCCM-upon stability-transfer to [facility].  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  See below for further details.  Significant events: 4/5>> admit for left foot osteomyelitis 4/6>> septic shock-staph aureus bacteremia-PCCM consulted 4/6>> left BKA 4/11>> TEE  Antimicrobial therapy: Vancomycin: 4/5>> Flagyl: 4/5>> 4/6 Cefepime: 4/5>> 4/6  Microbiology data: 4/5>> blood culture: MRSA 4/7>> blood culture: No growth  Subjective: Laying in bed with cloth eye covering.  States is having significant stomach pain.  Describes like typical neuropathic pain with sharp shooting throbbing pains.  Discussed utilization of Lyrica to treat this pain in conjunction with narcotics.  Patient states is hopeful that with treatment of pain therapy will be better tolerated  Objective: Vitals:   04/29/20 0426 04/29/20 0740  BP: 104/71  (!) 93/57  Pulse: 74 76  Resp: 18 17  Temp: 98.3 F (36.8 C) 98.4 F (36.9 C)  SpO2: 97% 97%    Intake/Output Summary (Last 24 hours) at 04/29/2020 0841 Last data filed at 04/29/2020 0810 Gross per 24 hour  Intake 227.5 ml  Output 450 ml  Net -222.5 ml   Filed Weights   04/15/20 1800  Weight: 71 kg    Exam:  Constitutional: NAD, calm, somewhat uncomfortable secondary to stump pain Respiratory: clear to auscultation bilaterally, no wheezing, no crackles. Normal respiratory effort. No accessory muscle use.  Room air Cardiovascular: Regular rate and rhythm, no murmurs / rubs / gallops. No extremity edema.  Skin warm and dry. Abdomen: no tenderness, no masses palpated. No hepatosplenomegaly. Bowel sounds positive. LBM 4/17 Musculoskeletal: .LEFT BKA  Neurologic: CN 2-12 grossly intact. Sensation intact, DTR normal. Strength 5/5 x all 4 extremities.  Psychiatric: Awakened.  Oriented x3.   Assessment/Plan: Acute problems: Septic shock due to MRSA bacteremia/left foot osteomyelitis with abscess involving his left foot:  Sepsis physiology has resolved-status post left BKA -Pharmacist spoke with ID who recommends changing from IV vancomycin to daptomycin before transitioning to oral doxycycline -TEE negative for vegetations.   -Needs 2 weeks of antibiotics from date of negative blood cultures.  Right hip pressure ulcer/L BKA surgical wound:  Due to slipping on concrete--continue wound care. Pressure Injury 04/15/20 Hip Right Unstageable - Full thickness tissue loss in which the base of the injury is covered by slough (yellow, tan, gray, green or brown) and/or eschar (tan, brown or black) in the wound  bed. 4x4 R hip  (Active)  Date First Assessed/Time First Assessed: 04/15/20 1900   Location: Hip  Location Orientation: Right  Staging: Unstageable - Full thickness tissue loss in which the base of the injury is covered by slough (yellow, tan, gray, green or brown) and/or esch...     Assessments 04/15/2020  8:29 PM 04/28/2020  8:30 PM  Dressing Type -- Dakin's-soaked gauze  Dressing -- Changed  Dressing Change Frequency -- Twice a day  State of Healing -- Early/partial granulation  Site / Wound Assessment -- Pink  Peri-wound Assessment Erythema (blanchable) Erythema (blanchable)  Drainage Amount None Minimal  Drainage Description -- Purulent  Treatment Cleansed;Off loading --     No Linked orders to display     Incision (Closed) 04/16/20 Leg Left (Active)  Date First Assessed/Time First Assessed: 04/16/20 1708   Location: Leg  Location Orientation: Left    Assessments 04/16/2020  5:59 PM 04/28/2020  8:30 PM  Dressing Type Negative pressure wound therapy None  Dressing Clean;Dry;Intact Clean;Dry;Intact  Site / Wound Assessment Dressing in place / Unable to assess Clean;Dry  Drainage Amount None --     No Linked orders to display  Begin Lyrica 75 mg 3 times daily for postoperative neuropathic pain Continue low-dose oxycodone as needed  Bipolar disorder:  Previously noncompliant with medications -evaluated by psychiatry-started on lithium  Hypokalemia K 3.0-replete Follow labs  Seizure disorder:  Continue Keppra  Nutrition Problem: Nutrition Problem: Severe Malnutrition Etiology: acute illness Signs/Symptoms: moderate fat depletion,moderate muscle depletion Interventions: Refer to RD note for recommendations Estimated body mass index is 21.23 kg/m as calculated from the following:   Height as of this encounter: 6' (1.829 m).   Weight as of this encounter: 71 kg.   Physical debility Continue PT and OT. Recommendation is for SNF Patient homeless prior to admission and not safe to return to street  Other problems: Vitamin B12 deficiency:  Continue weekly IM supplementation.  Hyponatremia:  Resolved  Normocytic anemia:  Due to acute illness-chronic osteomyelitis-follow CBC periodically  Chronic hepatitis C:  Per ID-plans are for outpatient  treatment.   Data Reviewed: Basic Metabolic Panel: Recent Labs  Lab 04/23/20 0244 04/29/20 0106  NA 136 131*  K 4.2 3.0*  CL 105 95*  CO2 26 28  GLUCOSE 93 170*  BUN 15 <5*  CREATININE 0.78 0.58*  CALCIUM 8.5* 8.3*   Liver Function Tests: Recent Labs  Lab 04/23/20 0244  AST 29  ALT 24  ALKPHOS 48  BILITOT 0.8  PROT 5.1*  ALBUMIN 2.3*   No results for input(s): LIPASE, AMYLASE in the last 168 hours. No results for input(s): AMMONIA in the last 168 hours. CBC: Recent Labs  Lab 04/23/20 0244 04/29/20 0106  WBC 9.5 11.8*  HGB 10.7* 13.0  HCT 32.7* 38.5*  MCV 96.7 85.7  PLT 506* 177   Cardiac Enzymes: No results for input(s): CKTOTAL, CKMB, CKMBINDEX, TROPONINI in the last 168 hours. BNP (last 3 results) Recent Labs    09/15/19 1023  BNP 57.8    ProBNP (last 3 results) No results for input(s): PROBNP in the last 8760 hours.  CBG: Recent Labs  Lab 04/23/20 0813 04/23/20 1209  GLUCAP 92 117*    No results found for this or any previous visit (from the past 240 hour(s)).   Studies: No results found.  Scheduled Meds: . (feeding supplement) PROSource Plus  30 mL Oral BID BM  . vitamin C  1,000 mg Oral Daily  . Chlorhexidine Gluconate  Cloth  6 each Topical Daily  . cyanocobalamin  1,000 mcg Intramuscular Weekly  . docusate sodium  100 mg Oral Daily  . enoxaparin (LOVENOX) injection  40 mg Subcutaneous Q24H  . feeding supplement  237 mL Oral TID BM  . folic acid  1 mg Oral Daily  . hydrOXYzine  50 mg Oral QHS  . levETIRAcetam  500 mg Oral BID  . lithium carbonate  300 mg Oral BID WC  . multivitamin with minerals  1 tablet Oral Daily  . pantoprazole  40 mg Oral Daily  . potassium chloride  40 mEq Oral Once  . pregabalin  75 mg Oral TID  . senna  1 tablet Oral BID  . sodium chloride flush  10-40 mL Intracatheter Q12H  . thiamine  250 mg Oral Daily  . zinc sulfate  220 mg Oral Daily   Continuous Infusions: . sodium chloride Stopped (04/20/20  0858)  . magnesium sulfate bolus IVPB    . vancomycin 1,000 mg (04/29/20 0146)    Active Problems:   Hyponatremia   Chronic hepatitis C ([place])   Bipolar disorder, most recent episode manic ([place])   MRSA bacteremia   Subacute osteomyelitis, left ankle and foot ([place])   Septic shock (HCC)   Cutaneous abscess of left foot   Left foot pain   IVDU (intravenous drug user)   Protein-calorie malnutrition, severe   Consultants: PCCM, orthopedics, infectious disease, psychiatry  Procedures: 4/6>> left BKA (Dr. Sharol Given) 4/11>> TEE: No vegetations, EF 60-65%   Antibiotics: Anti-infectives (From admission, onward)   Start     Dose/Rate Route Frequency Ordered Stop   04/28/20 1800  vancomycin (VANCOREADY) IVPB 1000 mg/200 mL        1,000 mg 200 mL/hr over 60 Minutes Intravenous Every 8 hours 04/28/20 1541     04/23/20 0130  linezolid (ZYVOX) tablet 600 mg  Status:  Discontinued        600 mg Oral Every 12 hours 04/23/20 0039 04/23/20 0854   04/16/20 0630  vancomycin (VANCOREADY) IVPB 1000 mg/200 mL  Status:  Discontinued       "Followed by" Linked Group Details   1,000 mg 200 mL/hr over 60 Minutes Intravenous Every 12 hours 04/15/20 1824 04/28/20 1400   04/16/20 0200  metroNIDAZOLE (FLAGYL) IVPB 500 mg  Status:  Discontinued        500 mg 100 mL/hr over 60 Minutes Intravenous Every 8 hours 04/15/20 1858 04/17/20 0945   04/15/20 1830  ceFEPIme (MAXIPIME) 2 g in sodium chloride 0.9 % 100 mL IVPB  Status:  Discontinued        2 g 200 mL/hr over 30 Minutes Intravenous Every 8 hours 04/15/20 1824 04/17/20 0849   04/15/20 1830  vancomycin (VANCOREADY) IVPB 1500 mg/300 mL       "Followed by" Linked Group Details   1,500 mg 150 mL/hr over 120 Minutes Intravenous  Once 04/15/20 1824 04/16/20 0003   04/15/20 1745  metroNIDAZOLE (FLAGYL) IVPB 500 mg        500 mg 100 mL/hr over 60 Minutes Intravenous  Once 04/15/20 1737 04/15/20 1914        Time spent: 35 minutes    Erin Hearing  ANP  [facility] 7 am - 330 pm/M-F for direct patient care and secure chat Please refer to Amion for contact info 14  days

## 2020-04-29 NOTE — Progress Notes (Addendum)
OT Cancellation Note  Patient Details Name: Andrew Chaney MRN: [id number] DOB: 03/14/1955   Cancelled Treatment:    Reason Eval/Treat Not Completed: Patient declined, no reason specified. OT and RN attempted to engage pt in EOB/OOB therapy session today. Despite education, encouragement and approaching pt with various strategies - pt adamantly refuses, reporting "I can't. No thank you. Come back later please" though pt does not give specific answers as to why he "can't". If time allows, will try again this afternoon but will need to sign off per department policy, as this is pt's 3rd consecutive OT refusal.   Checked back for OT session attempt at 3PM with pt still lying in bed with eyes covered. When attempted to engage pt in session as pt requested OT to check back later, pt reports "no, I'm okay". OT to sign off. Please reconsult if pt appears more willing to participate with therapy more consistently. Pt will likely need SNF at [place] 04/29/2020, 12:49 PM

## 2020-04-30 MED ORDER — PREGABALIN 100 MG PO CAPS
100.0000 mg | ORAL_CAPSULE | Freq: Three times a day (TID) | ORAL | Status: DC
  Administered 2020-04-30 – 2020-05-06 (×18): 100 mg via ORAL
  Filled 2020-04-30 (×18): qty 1

## 2020-04-30 NOTE — Progress Notes (Signed)
Nutrition Follow-up  DOCUMENTATION CODES:   Severe malnutrition in context of acute illness/injury  INTERVENTION:    Continue Ensure Enlive po TID, each supplement provides 350 kcal and 20 grams of protein.  Continue MVI with minerals daily.  Offer Prosource Plus 30 ml PO BID, each packet provides 100 kcal and 15 gm protein.  NUTRITION DIAGNOSIS:   Severe Malnutrition related to acute illness as evidenced by moderate fat depletion,moderate muscle depletion.  Ongoing   GOAL:   Patient will meet greater than or equal to 90% of their needs  Progressing  MONITOR:   PO intake,Supplement acceptance  REASON FOR ASSESSMENT:   Consult Assessment of nutrition requirement/status  ASSESSMENT:   65 yo male admitted with left foot pain, osteomyelitis. PMH includes bipolar disorder, homelessness, recent calcaneous fracture, MSSA bacteremia, cannabis abuse, seizure disorder, psychosis, chronic hepatitis C.  Patient reports good appetite and good intake of most meals.  He is also drinking Ensure supplements TID. He has not been receiving the Prosource Plus supplements.  Meal intakes: 10-100% (average 81% for past 6 meals recorded)  Labs reviewed (4/19): Na 131, K 3  Medications reviewed and include vitamin C, vitamin W-10, Colace, folic acid, MVI with minerals, Protonix, Senokot, thiamine.  No new weight available since amputation.  Diet Order:   Diet Order            Diet regular Room service appropriate? Yes with Assist; Fluid consistency: Thin  Diet effective now                 EDUCATION NEEDS:   No education needs have been identified at this time  Skin:  Skin Assessment: Skin Integrity Issues: Skin Integrity Issues:: Incisions,Unstageable Unstageable: Ulcer to the hip from a fall Incisions: L BKA site  Last BM:  4/17  Height:   Ht Readings from Last 1 Encounters:  04/15/20 6' (1.829 m)    Weight:   Wt Readings from Last 1 Encounters:  04/15/20 71 kg   prior to amputation  Ideal Body Weight:  76.1 kg (adjusted by 5.9% for BKA)   Estimated Nutritional Needs:   Kcal:  2300-2500  Protein:  130-140 gm  Fluid:  >/= 2.3 L    Lucas Mallow, RD, LDN, CNSC Please refer to Amion for contact information.

## 2020-04-30 NOTE — Progress Notes (Signed)
[facility] PROGRESS NOTE  Lathon Adan ACZ:[id number] DOB: 09/15/1955 DOA: 04/15/2020 PCP: Andrew Chaney, No Pcp Per (Inactive)  Status:  Remains inpatient appropriate because:Ongoing active pain requiring inpatient pain management, Unsafe d/c plan and IV treatments appropriate due to intensity of illness or inability to take PO   Dispo: The Andrew Chaney is from: Homeless              Anticipated d/c is to: SNF vs homeless shelter              Andrew Chaney currently is not medically stable to d/c.   Difficult to place Andrew Chaney Yes              Barriers to DC: No insurance-inconsistent participation w/ therapy   Level of care: Progressive  Code Status: FULL Family Communication: Andrew Chaney DVT prophylaxis: Lovenox Vaccination status: Unknown  Foley catheter: No  HPI: 65 y.o. male with history of bipolar disorder, MSSA bacteremia, chronic hepatitis C-who was diagnosed with left calcaneal fracture several months ago-homelessness-who was brought to the ED by [employer]-he was found to have osteomyelitis of his left foot-with septic shock-he was managed in the ICU by PCCM-upon stability-transfer to [facility].  Andrew Chaney underwent BKA on 4/6.  See below for further details.  Significant events: 4/5>> admit for left foot osteomyelitis 4/6>> septic shock-staph aureus bacteremia-PCCM consulted 4/6>> left BKA 4/11>> TEE  Antimicrobial therapy: Vancomycin: 4/5>> Flagyl: 4/5>> 4/6 Cefepime: 4/5>> 4/6  Microbiology data: 4/5>> blood culture: MRSA 4/7>> blood culture: No growth  Subjective: Sitting up in bed eating breakfast.  States pain remains inadequately controlled despite addition of Lyrica yesterday.  Discussed with Andrew Chaney that it would be very difficult to obtain SNF bed offer for rehabilitative therapies if he continues to refuse therapies.  I pointed out to him that as of today both PT and OT have signed off.  Our only other option would be to maximize therapy here and  eventually discharge him back to the streets.  I also discussed with him that as wound heals on BKA site orthopedic team will return to place stump shrinker.  Objective: Vitals:   04/30/20 0401 04/30/20 0753  BP: (!) 89/51 95/60  Pulse: 87 72  Resp: 18 16  Temp: 97.6 F (36.4 C) 97.8 F (36.6 C)  SpO2: 96% 96%    Intake/Output Summary (Last 24 hours) at 04/30/2020 0807 Last data filed at 04/30/2020 0800 Gross per 24 hour  Intake 780 ml  Output 1800 ml  Net -1020 ml   Filed Weights   04/15/20 1800  Weight: 71 kg    Exam:  Constitutional: Alert, calm, no acute distress Respiratory: Anterior lung sounds are clear.  Stable on room air Cardiovascular: S1-S2, no peripheral edema, regular pulse without tachycardia. Abdomen: LBM 4/17, soft nontender bowel sounds present.  Eating well Musculoskeletal: LEFT BKA  Neurologic: CN 2-12 grossly intact. Sensation intact, DTR normal. Strength 5/5 x all 4 extremities.  Psychiatric: Alert and oriented x3.  Pleasant.   Assessment/Plan: Acute problems: Septic shock due to MRSA bacteremia/left foot osteomyelitis with abscess involving his left foot:  Sepsis physiology has resolved-status post left BKA -Pharmacist spoke with ID who recommends changing from IV vancomycin to daptomycin before transitioning to oral doxycycline -TEE negative for vegetations.   -Needs 2 weeks of antibiotics from date of negative blood cultures.  Right hip pressure ulcer/L BKA surgical wound:  Due to slipping on concrete--continue wound care. Pressure Injury 04/15/20 Hip Right Unstageable - Full thickness tissue loss in which  the base of the injury is covered by slough (yellow, tan, gray, green or brown) and/or eschar (tan, brown or black) in the wound bed. 4x4 R hip  (Active)  Date First Assessed/Time First Assessed: 04/15/20 1900   Location: Hip  Location Orientation: Right  Staging: Unstageable - Full thickness tissue loss in which the base of the injury is  covered by slough (yellow, tan, gray, green or brown) and/or esch...    Assessments 04/15/2020  8:29 PM 04/28/2020  8:30 PM  Dressing Type -- Dakin's-soaked gauze  Dressing -- Changed  Dressing Change Frequency -- Twice a day  State of Healing -- Early/partial granulation  Site / Wound Assessment -- Pink  Peri-wound Assessment Erythema (blanchable) Erythema (blanchable)  Drainage Amount None Minimal  Drainage Description -- Purulent  Treatment Cleansed;Off loading --     No Linked orders to display     Incision (Closed) 04/16/20 Leg Left (Active)  Date First Assessed/Time First Assessed: 04/16/20 1708   Location: Leg  Location Orientation: Left    Assessments 04/16/2020  5:59 PM 04/30/2020  1:30 AM  Dressing Type Negative pressure wound therapy None  Dressing Clean;Dry;Intact Dry;Intact;Clean  Site / Wound Assessment Dressing in place / Unable to assess Clean;Dry  Margins -- Attached edges (approximated)  Closure -- Staples  Drainage Amount None None     No Linked orders to display  Increase Lyrica to 100 mg 3 times daily for postoperative neuropathic pain Continue low-dose oxycodone as needed  Bipolar disorder:  Previously noncompliant with medications -evaluated by psychiatry-started on lithium  Hypokalemia K 3.0-replete Follow labs  Seizure disorder:  Continue Keppra  Nutrition Problem: Nutrition Problem: Severe Malnutrition Etiology: acute illness Signs/Symptoms: moderate fat depletion,moderate muscle depletion Interventions: Refer to RD note for recommendations Estimated body mass index is 21.23 kg/m as calculated from the following:   Height as of this encounter: 6' (1.829 m).   Weight as of this encounter: 71 kg.   Physical debility Continue PT and OT. Recommendation is for SNF Andrew Chaney homeless prior to admission and not safe to return to street  Other problems: Vitamin B12 deficiency:  Continue weekly IM supplementation.  Hyponatremia:   Resolved  Normocytic anemia:  Due to acute illness-chronic osteomyelitis-follow CBC periodically  Chronic hepatitis C:  Per ID-plans are for outpatient treatment.   Data Reviewed: Basic Metabolic Panel: Recent Labs  Lab 04/29/20 0106  NA 131*  K 3.0*  CL 95*  CO2 28  GLUCOSE 170*  BUN <5*  CREATININE 0.58*  CALCIUM 8.3*   Liver Function Tests: No results for input(s): AST, ALT, ALKPHOS, BILITOT, PROT, ALBUMIN in the last 168 hours. No results for input(s): LIPASE, AMYLASE in the last 168 hours. No results for input(s): AMMONIA in the last 168 hours. CBC: Recent Labs  Lab 04/29/20 0106  WBC 11.8*  HGB 13.0  HCT 38.5*  MCV 85.7  PLT 177   Cardiac Enzymes: No results for input(s): CKTOTAL, CKMB, CKMBINDEX, TROPONINI in the last 168 hours. BNP (last 3 results) Recent Labs    09/15/19 1023  BNP 57.8    ProBNP (last 3 results) No results for input(s): PROBNP in the last 8760 hours.  CBG: Recent Labs  Lab 04/23/20 0813 04/23/20 1209  GLUCAP 92 117*    No results found for this or any previous visit (from the past 240 hour(s)).   Studies: No results found.  Scheduled Meds: . (feeding supplement) PROSource Plus  30 mL Oral BID BM  . vitamin C  1,000  mg Oral Daily  . Chlorhexidine Gluconate Cloth  6 each Topical Daily  . cyanocobalamin  1,000 mcg Intramuscular Weekly  . docusate sodium  100 mg Oral Daily  . enoxaparin (LOVENOX) injection  40 mg Subcutaneous Q24H  . feeding supplement  237 mL Oral TID BM  . folic acid  1 mg Oral Daily  . hydrOXYzine  50 mg Oral QHS  . levETIRAcetam  500 mg Oral BID  . lithium carbonate  300 mg Oral BID WC  . multivitamin with minerals  1 tablet Oral Daily  . pantoprazole  40 mg Oral Daily  . pregabalin  75 mg Oral TID  . senna  1 tablet Oral BID  . sodium chloride flush  10-40 mL Intracatheter Q12H  . thiamine  250 mg Oral Daily   Continuous Infusions: . sodium chloride Stopped (04/20/20 0858)  . DAPTOmycin  (CUBICIN)  IV 500 mg (04/29/20 1603)  . magnesium sulfate bolus IVPB      Active Problems:   Hyponatremia   Chronic hepatitis C ([place])   Bipolar disorder, most recent episode manic ([place])   MRSA bacteremia   Subacute osteomyelitis, left ankle and foot ([place])   Sepsis due to skin infection (HCC)   Cutaneous abscess of left foot   Left foot pain   IVDU (intravenous drug user)   Protein-calorie malnutrition, severe   S/P BKA (below knee amputation) unilateral, left ([place])   Consultants: PCCM, orthopedics, infectious disease, psychiatry  Procedures: 4/6>> left BKA (Dr. Sharol Given) 4/11>> TEE: No vegetations, EF 60-65%   Antibiotics: Anti-infectives (From admission, onward)   Start     Dose/Rate Route Frequency Ordered Stop   04/29/20 1400  DAPTOmycin (CUBICIN) 500 mg in sodium chloride 0.9 % IVPB        500 mg 120 mL/hr over 30 Minutes Intravenous Daily 04/29/20 1252     04/28/20 1800  vancomycin (VANCOREADY) IVPB 1000 mg/200 mL  Status:  Discontinued        1,000 mg 200 mL/hr over 60 Minutes Intravenous Every 8 hours 04/28/20 1541 04/29/20 1252   04/23/20 0130  linezolid (ZYVOX) tablet 600 mg  Status:  Discontinued        600 mg Oral Every 12 hours 04/23/20 0039 04/23/20 0854   04/16/20 0630  vancomycin (VANCOREADY) IVPB 1000 mg/200 mL  Status:  Discontinued       "Followed by" Linked Group Details   1,000 mg 200 mL/hr over 60 Minutes Intravenous Every 12 hours 04/15/20 1824 04/28/20 1400   04/16/20 0200  metroNIDAZOLE (FLAGYL) IVPB 500 mg  Status:  Discontinued        500 mg 100 mL/hr over 60 Minutes Intravenous Every 8 hours 04/15/20 1858 04/17/20 0945   04/15/20 1830  ceFEPIme (MAXIPIME) 2 g in sodium chloride 0.9 % 100 mL IVPB  Status:  Discontinued        2 g 200 mL/hr over 30 Minutes Intravenous Every 8 hours 04/15/20 1824 04/17/20 0849   04/15/20 1830  vancomycin (VANCOREADY) IVPB 1500 mg/300 mL       "Followed by" Linked Group Details   1,500 mg 150 mL/hr over 120  Minutes Intravenous  Once 04/15/20 1824 04/16/20 0003   04/15/20 1745  metroNIDAZOLE (FLAGYL) IVPB 500 mg        500 mg 100 mL/hr over 60 Minutes Intravenous  Once 04/15/20 1737 04/15/20 1914       Time spent: 35 minutes    Erin Hearing ANP  [facility] 7 am -  330 pm/M-F for direct Andrew Chaney care and secure chat Please refer to Amion for contact info 15  days

## 2020-04-30 NOTE — Progress Notes (Signed)
Physical Therapy Treatment Patient Details Name: Andrew Chaney MRN: [id number] DOB: 04-Nov-1955 Today's Date: 04/30/2020    History of Present Illness Pt is 65 y.o. male who presented on 04/15/20 with left foot pain (recent calcaneus fx) with purulent drainage and rt lateral hip pressure ulcer. 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  Pt with medical history significant of bipolar disorder homelessness recent calcaneus fracture, history of MSSA bacteremia, cannabis abuse, seizure disorder, psychosis, chronic hepatitis C    PT Comments    Pt agreeable to participate in therapy after Dr. Sloan Leiter explained dc options if pt didn't participate in therapy. When pt participates he actually is progressing with mobility and likely will eventually reach modified independent level.    Follow Up Recommendations  SNF     Equipment Recommendations  Wheelchair (measurements PT);Wheelchair cushion (measurements PT)    Recommendations for Other Services       Precautions / Restrictions Precautions Precautions: Fall Required Braces or Orthoses: Other Brace Other Brace: residual limb protector (Pt continues to adamantly refused use)    Mobility  Bed Mobility Overal bed mobility: Modified Independent Bed Mobility: Supine to Sit     Supine to sit: Modified independent (Device/Increase time)          Transfers Overall transfer level: Needs assistance Equipment used: Rolling walker (2 wheeled) Transfers: Sit to/from Stand;Lateral/Scoot Transfers Sit to Stand: Min guard        Lateral/Scoot Transfers: Supervision General transfer comment: lateral scoot from bed to drop arm recliner with supervision. Sit to stand x 2 with walker from recliner. Verbal cues for hand placement  Ambulation/Gait             General Gait Details: did not attempt due to 1 person assist   Stairs             Wheelchair Mobility    Modified Rankin (Stroke Patients Only)        Balance Overall balance assessment: Needs assistance Sitting-balance support: Feet supported;No upper extremity supported Sitting balance-Leahy Scale: Good     Standing balance support: Bilateral upper extremity supported;During functional activity Standing balance-Leahy Scale: Poor Standing balance comment: walker and min guard assist for static standing                            Cognition Arousal/Alertness: Awake/alert Behavior During Therapy: Anxious Overall Cognitive Status: No family/caregiver present to determine baseline cognitive functioning Area of Impairment: Memory;Safety/judgement;Problem solving                     Memory: Decreased short-term memory   Safety/Judgement: Decreased awareness of safety            Exercises Amputee Exercises Hip ABduction/ADduction: AROM;Left;Supine;5 reps Hip Flexion/Marching: AROM;Left;5 reps;Supine Knee Flexion: Left;5 reps;Seated;AROM Knee Extension: Left;5 reps;Seated;AROM Straight Leg Raises: AROM;Left;Supine;5 reps    General Comments        Pertinent Vitals/Pain Pain Assessment: Faces Faces Pain Scale: Hurts little more Pain Location: LLE Pain Descriptors / Indicators: Grimacing;Throbbing Pain Intervention(s): Limited activity within patient's tolerance;Monitored during session;Repositioned    Home Living                      Prior Function            PT Goals (current goals can now be found in the care plan section) Acute Rehab PT Goals Patient Stated Goal: decrease pain PT  Goal Formulation: With patient Time For Goal Achievement: 05/14/20 Potential to Achieve Goals: Good Progress towards PT goals: Progressing toward goals;Goals met and updated - see care plan    Frequency    Min 3X/week      PT Plan Current plan remains appropriate;Frequency needs to be updated    Co-evaluation              AM-PAC PT "6 Clicks" Mobility   Outcome Measure  Help needed  turning from your back to your side while in a flat bed without using bedrails?: None Help needed moving from lying on your back to sitting on the side of a flat bed without using bedrails?: A Little Help needed moving to and from a bed to a chair (including a wheelchair)?: A Little Help needed standing up from a chair using your arms (e.g., wheelchair or bedside chair)?: A Little Help needed to walk in hospital room?: Total Help needed climbing 3-5 steps with a railing? : Total 6 Click Score: 15    End of Session Equipment Utilized During Treatment: Gait belt Activity Tolerance: Patient tolerated treatment well Patient left: with call bell/phone within reach;in chair;with chair alarm set Nurse Communication: Mobility status PT Visit Diagnosis: Difficulty in walking, not elsewhere classified (R26.2);Pain Pain - Right/Left: Left Pain - part of body: Leg     Time: 1210-1228 PT Time Calculation (min) (ACUTE ONLY): 18 min  Charges:  $Therapeutic Activity: 8-22 mins                     [place] Pager [phone] Office [place] 04/30/2020, 2:48 PM

## 2020-04-30 NOTE — Progress Notes (Signed)
PT Cancellation Note  Patient Details Name: Andrew Chaney MRN: [id number] DOB: [birth year]   Cancelled Treatment:    Reason Eval/Treat Not Completed: Patient declined, no reason specified. Pt again refusing all therapy. Only would say he wanted and warm blanket and that he was okay. Explained that if he continued to refuse we would discharge him from PT. Explained we needed him to participate in therapy to continue getting therapy. Pt continued to refuse. Will dc from PT if he refuses again on next attempt.    [name] [facility] 04/30/2020, 10:44 AM [place] Pager [phone] Office [phone]

## 2020-04-30 NOTE — Progress Notes (Signed)
Pt refused dressing changes this evening. Educated the patient on the importance of keeping the wound clean, but he said "No thank you, I'm OK."  Pt declining pain medications at this time. Appears comfortable in bed.

## 2020-05-01 LAB — CK: Total CK: 19 U/L — ABNORMAL LOW (ref 49–397)

## 2020-05-01 MED ORDER — ACETAMINOPHEN 325 MG PO TABS
650.0000 mg | ORAL_TABLET | ORAL | Status: DC | PRN
  Administered 2020-05-01 – 2020-05-06 (×3): 650 mg via ORAL
  Filled 2020-05-01 (×3): qty 2

## 2020-05-01 MED ORDER — SODIUM CHLORIDE 0.9 % IV SOLN
500.0000 mg | Freq: Every day | INTRAVENOUS | Status: DC
  Administered 2020-05-01: 500 mg via INTRAVENOUS
  Filled 2020-05-01 (×2): qty 10

## 2020-05-01 MED ORDER — DOXYCYCLINE HYCLATE 100 MG PO TABS: 100.0000 mg | ORAL_TABLET | Freq: Two times a day (BID) | ORAL | Status: DC

## 2020-05-01 NOTE — Progress Notes (Signed)
Patient loss IV access.  Patient refused for RN to attempt new PIV.  Pt educated on the importance of maintaining IV access.  RN will attempt to start IV again in the morning.    Pt re-educated on the importance of wearing compression sock for BKA.  Pt refused to let RN apply the compression sock.

## 2020-05-01 NOTE — Progress Notes (Addendum)
[facility] PROGRESS NOTE  Andrew Chaney YPP:[id number] DOB: Jul 23, 1955 DOA: 04/15/2020 PCP: Patient, No Pcp Per (Inactive)  Status:  Remains inpatient appropriate because:Ongoing active pain requiring inpatient pain management, Unsafe d/c plan and IV treatments appropriate due to intensity of illness or inability to take PO   Dispo: The patient is from: Homeless              Anticipated d/c is to: SNF vs homeless shelter              Patient currently is not medically stable to d/c.   Difficult to place patient Yes              Barriers to DC: No insurance-inconsistent participation w/ therapy   Level of care: Progressive  Code Status: FULL Family Communication: Patient DVT prophylaxis: Lovenox Vaccination status: Unknown  Foley catheter: No  HPI: 65 y.o. male with history of bipolar disorder, MSSA bacteremia, chronic hepatitis C-who was diagnosed with left calcaneal fracture several months ago-homelessness-who was brought to the ED by [employer]-he was found to have osteomyelitis of his left foot-with septic shock-he was managed in the ICU by PCCM-upon stability-transfer to [facility].  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  See below for further details.  Significant events: 4/5>> admit for left foot osteomyelitis 4/6>> septic shock-staph aureus bacteremia-PCCM consulted 4/6>> left BKA 4/11>> TEE  Antimicrobial therapy: Vancomycin: 4/5>> Flagyl: 4/5>> 4/6 Cefepime: 4/5>> 4/6  Microbiology data: 4/5>> blood culture: MRSA 4/7>> blood culture: No growth  Subjective: Awakened.  Very pleasant.  Congratulated patient on doing well with PT and encouraged him to continue to participate with PT and OT.  Patient reporting mild headache and this is why he is keeping the room dark and the blinds closed.  I asked him if he was doing this because he was depressed and he stated no.  Continues to not allow nursing staff to perform dressings on amputation site.  He does  report that the pain is better with the change in Lyrica dose.  Objective: Vitals:   05/01/20 0400 05/01/20 0754  BP: 101/65 91/65  Pulse: 70 75  Resp: 18 17  Temp: 98.5 F (36.9 C) 98.9 F (37.2 C)  SpO2: 98% 98%    Intake/Output Summary (Last 24 hours) at 05/01/2020 0843 Last data filed at 05/01/2020 0500 Gross per 24 hour  Intake 1071 ml  Output 1100 ml  Net -29 ml   Filed Weights   04/15/20 1800  Weight: 71 kg    Exam:  Constitutional: Alert once awakened, calm, no acute distress Respiratory: Lung sounds are clear.  Remained stable on room air without any increased work of breathing Cardiovascular: S1-S2, no peripheral edema.  Normotensive Abdomen: LBM 4/20, soft and nontender.  Eating well.  Normoactive bowel sounds Musculoskeletal: LEFT BKA -site unremarkable with some staples in place Neurologic: CN 2-12 grossly intact. Sensation intact, DTR normal. Strength 5/5 x all 4 extremities.  Psychiatric: Alert and oriented x3.  Pleasant.  Limited insight into current medical status   Assessment/Plan: Acute problems: Septic shock due to MRSA bacteremia/left foot osteomyelitis with abscess involving his left foot:  Sepsis physiology has resolved-status post left BKA -Pharmacist spoke with ID who recommends changing from IV vancomycin to daptomycin before transitioning to oral doxycycline -TEE negative for vegetations.   -Needs 2 weeks of antibiotics from date of negative blood cultures (4/7).  Although 4/21 is last day of IV antibiotics i.e. 14 days pharmacy has discussed with ID and they  prefer patient to remain on IV antibiotics as long as possible i.e. while hospitalized then transition to oral antibiotics after discharge.  Right hip pressure ulcer/L BKA surgical wound:  Due to slipping on concrete--continue wound care. Pressure Injury 04/15/20 Hip Right Unstageable - Full thickness tissue loss in which the base of the injury is covered by slough (yellow, tan, gray,  green or brown) and/or eschar (tan, brown or black) in the wound bed. 4x4 R hip  (Active)  Date First Assessed/Time First Assessed: 04/15/20 1900   Location: Hip  Location Orientation: Right  Staging: Unstageable - Full thickness tissue loss in which the base of the injury is covered by slough (yellow, tan, gray, green or brown) and/or esch...    Assessments 04/15/2020  8:29 PM 04/28/2020  8:30 PM  Dressing Type -- Dakin's-soaked gauze  Dressing -- Changed  Dressing Change Frequency -- Twice a day  State of Healing -- Early/partial granulation  Site / Wound Assessment -- Pink  Peri-wound Assessment Erythema (blanchable) Erythema (blanchable)  Drainage Amount None Minimal  Drainage Description -- Purulent  Treatment Cleansed;Off loading --     No Linked orders to display     Incision (Closed) 04/16/20 Leg Left (Active)  Date First Assessed/Time First Assessed: 04/16/20 1708   Location: Leg  Location Orientation: Left    Assessments 04/16/2020  5:59 PM 04/30/2020  9:00 PM  Dressing Type Negative pressure wound therapy None  Dressing Clean;Dry;Intact --  Site / Wound Assessment Dressing in place / Unable to assess Clean;Dry  Closure -- Approximated;Staples  Drainage Amount None None     No Linked orders to display  Increase Lyrica to 100 mg 3 times daily for postoperative neuropathic pain Continue low-dose oxycodone as needed  Bipolar disorder:  Previously noncompliant with medications -evaluated by psychiatry-started on lithium  Hypokalemia K 3.0-replete Follow labs  Seizure disorder:  Continue Keppra  Nutrition Problem: Nutrition Problem: Severe Malnutrition Etiology: acute illness Signs/Symptoms: moderate fat depletion,moderate muscle depletion Interventions: Refer to RD note for recommendations Estimated body mass index is 21.23 kg/m as calculated from the following:   Height as of this encounter: 6' (1.829 m).   Weight as of this encounter: 71 kg.   Physical  debility Continue PT and OT. Recommendation is for SNF Patient homeless prior to admission and not safe to return to street Patient had been inconsistently participating but on 4/20 after my attending physician also explained patient's discharge options if he did not participate in therapy he agreed to participate.  According to PT notes he progresses well with mobility and will likely be able to reach modified independent level if he continues to participate as recommended with PT.  PT evaluation 4/20: Bed Mobility Overal bed mobility: Modified Independent Bed Mobility: Supine to Sit Supine to sit: Modified independent (Device/Increase time)  Transfers Overall transfer level: Needs assistance Equipment used: Rolling walker (2 wheeled) Transfers: Sit to/from Stand;Lateral/Scoot Transfers Sit to Stand: Min guard  Lateral/Scoot Transfers: Supervision General transfer comment: lateral scoot from bed to drop arm recliner with supervision. Sit to stand x 2 with walker from recliner. Verbal cues for hand placement  Ambulation/Gait General Gait Details: did not attempt due to 1 person assist Balance Overall balance assessment: Needs assistance Sitting-balance support: Feet supported;No upper extremity supported Sitting balance-Leahy Scale: Good Standing balance support: Bilateral upper extremity supported;During functional activity Standing balance-Leahy Scale: Poor Standing balance comment: walker and min guard assist for static standing    Cognition Arousal/Alertness: Awake/alert Behavior During Therapy: Anxious  Overall Cognitive Status: No family/caregiver present to determine baseline cognitive functioning Area of Impairment: Memory;Safety/judgement;Problem solving Memory: Decreased short-term memory Safety/Judgement: Decreased awareness of safety    Exercises Amputee Exercises Hip ABduction/ADduction: AROM;Left;Supine;5 reps Hip Flexion/Marching: AROM;Left;5  reps;Supine Knee Flexion: Left;5 reps;Seated;AROM Knee Extension: Left;5 reps;Seated;AROM Straight Leg Raises: AROM;Left;Supine;5 reps     Other problems: Vitamin B12 deficiency:  Continue weekly IM supplementation.  Hyponatremia:  Resolved  Normocytic anemia:  Due to acute illness-chronic osteomyelitis-follow CBC periodically  Chronic hepatitis C:  Per ID-plans are for outpatient treatment.   Data Reviewed: Basic Metabolic Panel: Recent Labs  Lab 04/29/20 0106  NA 131*  K 3.0*  CL 95*  CO2 28  GLUCOSE 170*  BUN <5*  CREATININE 0.58*  CALCIUM 8.3*   Liver Function Tests: No results for input(s): AST, ALT, ALKPHOS, BILITOT, PROT, ALBUMIN in the last 168 hours. No results for input(s): LIPASE, AMYLASE in the last 168 hours. No results for input(s): AMMONIA in the last 168 hours. CBC: Recent Labs  Lab 04/29/20 0106  WBC 11.8*  HGB 13.0  HCT 38.5*  MCV 85.7  PLT 177   Cardiac Enzymes: No results for input(s): CKTOTAL, CKMB, CKMBINDEX, TROPONINI in the last 168 hours. BNP (last 3 results) Recent Labs    09/15/19 1023  BNP 57.8    ProBNP (last 3 results) No results for input(s): PROBNP in the last 8760 hours.  CBG: No results for input(s): GLUCAP in the last 168 hours.  No results found for this or any previous visit (from the past 240 hour(s)).   Studies: No results found.  Scheduled Meds: . (feeding supplement) PROSource Plus  30 mL Oral BID BM  . vitamin C  1,000 mg Oral Daily  . Chlorhexidine Gluconate Cloth  6 each Topical Daily  . cyanocobalamin  1,000 mcg Intramuscular Weekly  . docusate sodium  100 mg Oral Daily  . enoxaparin (LOVENOX) injection  40 mg Subcutaneous Q24H  . feeding supplement  237 mL Oral TID BM  . folic acid  1 mg Oral Daily  . hydrOXYzine  50 mg Oral QHS  . levETIRAcetam  500 mg Oral BID  . lithium carbonate  300 mg Oral BID WC  . multivitamin with minerals  1 tablet Oral Daily  . pantoprazole  40 mg Oral Daily   . pregabalin  100 mg Oral TID  . senna  1 tablet Oral BID  . sodium chloride flush  10-40 mL Intracatheter Q12H  . thiamine  250 mg Oral Daily   Continuous Infusions: . sodium chloride Stopped (04/20/20 0858)  . DAPTOmycin (CUBICIN)  IV Stopped (04/30/20 2300)  . magnesium sulfate bolus IVPB      Active Problems:   Hyponatremia   Chronic hepatitis C (HCC)   Bipolar disorder, most recent episode manic (HCC)   MRSA bacteremia   Subacute osteomyelitis, left ankle and foot (HCC)   Sepsis due to skin infection (HCC)   Cutaneous abscess of left foot   Left foot pain   IVDU (intravenous drug user)   Protein-calorie malnutrition, severe   S/P BKA (below knee amputation) unilateral, left (Combine)   Consultants: PCCM, orthopedics, infectious disease, psychiatry  Procedures: 4/6>> left BKA (Dr. Sharol Given) 4/11>> TEE: No vegetations, EF 60-65%   Antibiotics: Anti-infectives (From admission, onward)   Start     Dose/Rate Route Frequency Ordered Stop   04/29/20 1400  DAPTOmycin (CUBICIN) 500 mg in sodium chloride 0.9 % IVPB        500 mg  120 mL/hr over 30 Minutes Intravenous Daily 04/29/20 1252     04/28/20 1800  vancomycin (VANCOREADY) IVPB 1000 mg/200 mL  Status:  Discontinued        1,000 mg 200 mL/hr over 60 Minutes Intravenous Every 8 hours 04/28/20 1541 04/29/20 1252   04/23/20 0130  linezolid (ZYVOX) tablet 600 mg  Status:  Discontinued        600 mg Oral Every 12 hours 04/23/20 0039 04/23/20 0854   04/16/20 0630  vancomycin (VANCOREADY) IVPB 1000 mg/200 mL  Status:  Discontinued       "Followed by" Linked Group Details   1,000 mg 200 mL/hr over 60 Minutes Intravenous Every 12 hours 04/15/20 1824 04/28/20 1400   04/16/20 0200  metroNIDAZOLE (FLAGYL) IVPB 500 mg  Status:  Discontinued        500 mg 100 mL/hr over 60 Minutes Intravenous Every 8 hours 04/15/20 1858 04/17/20 0945   04/15/20 1830  ceFEPIme (MAXIPIME) 2 g in sodium chloride 0.9 % 100 mL IVPB  Status:  Discontinued         2 g 200 mL/hr over 30 Minutes Intravenous Every 8 hours 04/15/20 1824 04/17/20 0849   04/15/20 1830  vancomycin (VANCOREADY) IVPB 1500 mg/300 mL       "Followed by" Linked Group Details   1,500 mg 150 mL/hr over 120 Minutes Intravenous  Once 04/15/20 1824 04/16/20 0003   04/15/20 1745  metroNIDAZOLE (FLAGYL) IVPB 500 mg        500 mg 100 mL/hr over 60 Minutes Intravenous  Once 04/15/20 1737 04/15/20 1914       Time spent: 35 minutes    Erin Hearing ANP  [facility] 7 am - 330 pm/M-F for direct patient care and secure chat Please refer to Amion for contact info 16  days

## 2020-05-02 MED ORDER — DOXYCYCLINE HYCLATE 100 MG PO TABS
100.0000 mg | ORAL_TABLET | Freq: Two times a day (BID) | ORAL | Status: DC
  Administered 2020-05-02 – 2020-05-09 (×16): 100 mg via ORAL
  Filled 2020-05-02 (×15): qty 1

## 2020-05-02 NOTE — Progress Notes (Signed)
[facility] PROGRESS NOTE  Andrew Chaney BDZ:[id number] DOB: [birth year] DOA: 04/15/2020 PCP: Patient, No Pcp Per (Inactive)  Status:  Remains inpatient appropriate because:Ongoing active pain requiring inpatient pain management, Unsafe d/c plan and IV treatments appropriate due to intensity of illness or inability to take PO   Dispo: The patient is from: Homeless              Anticipated d/c is to: SNF vs homeless shelter              Patient currently is not medically stable to d/c.   Difficult to place patient Yes              Barriers to DC: No insurance-inconsistent participation w/ therapy   Level of care: Progressive  Code Status: FULL Family Communication: Patient DVT prophylaxis: Lovenox Vaccination status: Unknown  Foley catheter: No  HPI: 65 y.o. male with history of bipolar disorder, MSSA bacteremia, chronic hepatitis C-who was diagnosed with left calcaneal fracture several months ago-homelessness-who was brought to the ED by [employer]-he was found to have osteomyelitis of his left foot-with septic shock-he was managed in the ICU by PCCM-upon stability-transfer to the [facility].  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  See below for further details.  Significant events: 4/5>> admit for left foot osteomyelitis 4/6>> septic shock-staph aureus bacteremia-PCCM consulted 4/6>> left BKA 4/11>> TEE  Antimicrobial therapy: Vancomycin: 4/5>> 4/19 Flagyl: 4/5>> 4/6 Cefepime: 4/5>> 4/6 Daptomycin 4/19 >> 4/22 Oral doxycycline 4/22  Microbiology data: 4/5>> blood culture: MRSA 4/7>> blood culture: No growth  Subjective: Sleeping.  Awakened.  Discussed necessity of having IV replaced for continued administration of IV daptomycin.  Patient states that he does not want an IV replaced and wants the medications by mouth.  Unable to encourage patient to have IV replaced.  Continues to report significant improvement in stomach pain after the addition of Lyrica.   Declined my offer to allow him to leave unit with staff to go outside  Objective: Vitals:   05/02/20 0015 05/02/20 0400  BP: 95/70 96/75  Pulse: 80 82  Resp: 18 18  Temp: 98.5 F (36.9 C) 98.9 F (37.2 C)  SpO2: 98% 98%    Intake/Output Summary (Last 24 hours) at 05/02/2020 0759 Last data filed at 05/02/2020 0507 Gross per 24 hour  Intake 780.03 ml  Output 900 ml  Net -119.97 ml   Filed Weights   04/15/20 1800  Weight: 71 kg    Exam:  Constitutional: Sleeping but easily awakened.  Calm.  No apparent distress or pain Respiratory: Anterior lung sounds are clear to auscultation bilaterally.  Room air. Cardiovascular: S1-S2, no peripheral edema, regular pulse. Abdomen: LBM 4/21, soft nontender nondistended.  Eating well.  Normoactive bowel sounds. Musculoskeletal: LEFT BKA -site unremarkable with some staples in place Neurologic: CN 2-12 grossly intact. Sensation intact, DTR normal. Strength 5/5 x all 4 extremities.  Psychiatric: Alert and oriented x3.  Pleasant.  Limited insight into current medical status   Assessment/Plan: Acute problems: Septic shock due to MRSA bacteremia/left foot osteomyelitis with abscess involving his left foot:  Sepsis physiology has resolved-status post left BKA -Pharmacist spoke with ID who recommends changing from IV vancomycin to daptomycin before transitioning to oral doxycycline -TEE negative for vegetations.   -Needs 2 weeks of antibiotics from date of negative blood cultures (4/7).  Although 4/21 is last day of IV antibiotics i.e. 14 days pharmacy has discussed with ID and they prefer patient to remain on IV antibiotics as  long as possible i.e. while hospitalized then transition to oral antibiotics after discharge. -4/22 patient's IV dislodged.  Patient refusing multiple attempts by staff to place IV.  I counseled patient but he insists on no further IV placement and wants all of his medications by mouth.  IV daptomycin discontinued in favor of  doxycycline.  Will need doxycycline for 30 days as recommended by ID.  Recent CK was normal.  Right hip pressure ulcer/L BKA surgical wound:  Due to slipping on concrete--continue wound care. Pressure Injury 04/15/20 Hip Right Unstageable - Full thickness tissue loss in which the base of the injury is covered by slough (yellow, tan, gray, green or brown) and/or eschar (tan, brown or black) in the wound bed. 4x4 R hip  (Active)  Date First Assessed/Time First Assessed: 04/15/20 1900   Location: Hip  Location Orientation: Right  Staging: Unstageable - Full thickness tissue loss in which the base of the injury is covered by slough (yellow, tan, gray, green or brown) and/or esch...    Assessments 04/15/2020  8:29 PM 04/28/2020  8:30 PM  Dressing Type -- Dakin's-soaked gauze  Dressing -- Changed  Dressing Change Frequency -- Twice a day  State of Healing -- Early/partial granulation  Site / Wound Assessment -- Pink  Peri-wound Assessment Erythema (blanchable) Erythema (blanchable)  Drainage Amount None Minimal  Drainage Description -- Purulent  Treatment Cleansed;Off loading --     No Linked orders to display     Incision (Closed) 04/16/20 Leg Left (Active)  Date First Assessed/Time First Assessed: 04/16/20 1708   Location: Leg  Location Orientation: Left    Assessments 04/16/2020  5:59 PM 05/02/2020  8:25 AM  Dressing Type Negative pressure wound therapy --  Dressing Clean;Dry;Intact Other (Comment)  Site / Wound Assessment Dressing in place / Unable to assess --  Closure -- Staples  Drainage Amount None --     No Linked orders to display  Increase Lyrica to 100 mg 3 times daily for postoperative neuropathic pain Continue low-dose oxycodone as needed  Bipolar disorder:  Previously noncompliant with medications -evaluated by psychiatry-started on lithium  Hypokalemia K 3.0-replete Follow labs  Seizure disorder:  Continue Keppra  Nutrition Problem: Nutrition Problem: Severe  Malnutrition Etiology: acute illness Signs/Symptoms: moderate fat depletion,moderate muscle depletion Interventions: Refer to RD note for recommendations Estimated body mass index is 21.23 kg/m as calculated from the following:   Height as of this encounter: 6' (1.829 m).   Weight as of this encounter: 71 kg.   Physical debility Continue PT and OT. Recommendation is for SNF Patient homeless prior to admission and not safe to return to street Patient had been inconsistently participating but on 4/20 after my attending physician also explained patient's discharge options if he did not participate in therapy he agreed to participate.  According to PT notes he progresses well with mobility and will likely be able to reach modified independent level if he continues to participate as recommended with PT.  PT evaluation 4/20: Bed Mobility Overal bed mobility: Modified Independent Bed Mobility: Supine to Sit Supine to sit: Modified independent (Device/Increase time)  Transfers Overall transfer level: Needs assistance Equipment used: Rolling walker (2 wheeled) Transfers: Sit to/from Stand;Lateral/Scoot Transfers Sit to Stand: Min guard  Lateral/Scoot Transfers: Supervision General transfer comment: lateral scoot from bed to drop arm recliner with supervision. Sit to stand x 2 with walker from recliner. Verbal cues for hand placement  Ambulation/Gait General Gait Details: did not attempt due to 1 person assist  Balance Overall balance assessment: Needs assistance Sitting-balance support: Feet supported;No upper extremity supported Sitting balance-Leahy Scale: Good Standing balance support: Bilateral upper extremity supported;During functional activity Standing balance-Leahy Scale: Poor Standing balance comment: walker and min guard assist for static standing    Cognition Arousal/Alertness: Awake/alert Behavior During Therapy: Anxious Overall Cognitive Status: No family/caregiver  present to determine baseline cognitive functioning Area of Impairment: Memory;Safety/judgement;Problem solving Memory: Decreased short-term memory Safety/Judgement: Decreased awareness of safety    Exercises Amputee Exercises Hip ABduction/ADduction: AROM;Left;Supine;5 reps Hip Flexion/Marching: AROM;Left;5 reps;Supine Knee Flexion: Left;5 reps;Seated;AROM Knee Extension: Left;5 reps;Seated;AROM Straight Leg Raises: AROM;Left;Supine;5 reps     Other problems: Vitamin B12 deficiency:  Continue weekly IM supplementation.  Hyponatremia:  Resolved  Normocytic anemia:  Due to acute illness-chronic osteomyelitis-follow CBC periodically  Chronic hepatitis C:  Per ID-plans are for outpatient treatment.   Data Reviewed: Basic Metabolic Panel: Recent Labs  Lab 04/29/20 0106  NA 131*  K 3.0*  CL 95*  CO2 28  GLUCOSE 170*  BUN <5*  CREATININE 0.58*  CALCIUM 8.3*   Liver Function Tests: No results for input(s): AST, ALT, ALKPHOS, BILITOT, PROT, ALBUMIN in the last 168 hours. No results for input(s): LIPASE, AMYLASE in the last 168 hours. No results for input(s): AMMONIA in the last 168 hours. CBC: Recent Labs  Lab 04/29/20 0106  WBC 11.8*  HGB 13.0  HCT 38.5*  MCV 85.7  PLT 177   Cardiac Enzymes: Recent Labs  Lab 05/01/20 1336  CKTOTAL 19*   BNP (last 3 results) Recent Labs    09/15/19 1023  BNP 57.8    ProBNP (last 3 results) No results for input(s): PROBNP in the last 8760 hours.  CBG: No results for input(s): GLUCAP in the last 168 hours.  No results found for this or any previous visit (from the past 240 hour(s)).   Studies: No results found.  Scheduled Meds: . (feeding supplement) PROSource Plus  30 mL Oral BID BM  . vitamin C  1,000 mg Oral Daily  . Chlorhexidine Gluconate Cloth  6 each Topical Daily  . cyanocobalamin  1,000 mcg Intramuscular Weekly  . docusate sodium  100 mg Oral Daily  . enoxaparin (LOVENOX) injection  40 mg  Subcutaneous Q24H  . feeding supplement  237 mL Oral TID BM  . folic acid  1 mg Oral Daily  . hydrOXYzine  50 mg Oral QHS  . levETIRAcetam  500 mg Oral BID  . lithium carbonate  300 mg Oral BID WC  . multivitamin with minerals  1 tablet Oral Daily  . pantoprazole  40 mg Oral Daily  . pregabalin  100 mg Oral TID  . senna  1 tablet Oral BID  . sodium chloride flush  10-40 mL Intracatheter Q12H  . thiamine  250 mg Oral Daily   Continuous Infusions: . sodium chloride Stopped (04/20/20 0858)  . DAPTOmycin (CUBICIN)  IV Stopped (05/01/20 2200)  . magnesium sulfate bolus IVPB      Active Problems:   Hyponatremia   Chronic hepatitis C (HCC)   Bipolar disorder, most recent episode manic (HCC)   MRSA bacteremia   Subacute osteomyelitis, left ankle and foot (HCC)   Sepsis due to skin infection (HCC)   Cutaneous abscess of left foot   Left foot pain   IVDU (intravenous drug user)   Protein-calorie malnutrition, severe   S/P BKA (below knee amputation) unilateral, left [facility])   Consultants: PCCM, orthopedics, infectious disease, psychiatry  Procedures: 4/6>> left BKA (Dr. Sharol Given)  4/11>> TEE: No vegetations, EF 60-65%   Antibiotics: Anti-infectives (From admission, onward)   Start     Dose/Rate Route Frequency Ordered Stop   05/02/20 1000  doxycycline (VIBRA-TABS) tablet 100 mg  Status:  Discontinued        100 mg Oral Every 12 hours 05/01/20 1204 05/01/20 1301   05/01/20 2000  DAPTOmycin (CUBICIN) 500 mg in sodium chloride 0.9 % IVPB       Note to Pharmacy: It appears last dose is due today.  I have already ordered the doxycycline to begin tomorrow for a total of 30 days as recommended by ID.   500 mg 120 mL/hr over 30 Minutes Intravenous Daily 05/01/20 1205     04/29/20 1400  DAPTOmycin (CUBICIN) 500 mg in sodium chloride 0.9 % IVPB  Status:  Discontinued        500 mg 120 mL/hr over 30 Minutes Intravenous Daily 04/29/20 1252 05/01/20 1205   04/28/20 1800  vancomycin  (VANCOREADY) IVPB 1000 mg/200 mL  Status:  Discontinued        1,000 mg 200 mL/hr over 60 Minutes Intravenous Every 8 hours 04/28/20 1541 04/29/20 1252   04/23/20 0130  linezolid (ZYVOX) tablet 600 mg  Status:  Discontinued        600 mg Oral Every 12 hours 04/23/20 0039 04/23/20 0854   04/16/20 0630  vancomycin (VANCOREADY) IVPB 1000 mg/200 mL  Status:  Discontinued       "Followed by" Linked Group Details   1,000 mg 200 mL/hr over 60 Minutes Intravenous Every 12 hours 04/15/20 1824 04/28/20 1400   04/16/20 0200  metroNIDAZOLE (FLAGYL) IVPB 500 mg  Status:  Discontinued        500 mg 100 mL/hr over 60 Minutes Intravenous Every 8 hours 04/15/20 1858 04/17/20 0945   04/15/20 1830  ceFEPIme (MAXIPIME) 2 g in sodium chloride 0.9 % 100 mL IVPB  Status:  Discontinued        2 g 200 mL/hr over 30 Minutes Intravenous Every 8 hours 04/15/20 1824 04/17/20 0849   04/15/20 1830  vancomycin (VANCOREADY) IVPB 1500 mg/300 mL       "Followed by" Linked Group Details   1,500 mg 150 mL/hr over 120 Minutes Intravenous  Once 04/15/20 1824 04/16/20 0003   04/15/20 1745  metroNIDAZOLE (FLAGYL) IVPB 500 mg        500 mg 100 mL/hr over 60 Minutes Intravenous  Once 04/15/20 1737 04/15/20 1914       Time spent: 35 minutes    Erin Hearing ANP  [facility] 7 am - 330 pm/M-F for direct patient care and secure chat Please refer to Amion for contact info 17  days

## 2020-05-02 NOTE — Progress Notes (Signed)
Spoke with Dr. Darnell Level and ok, to DC tele as pt k,eeps refusing that and IV access

## 2020-05-02 NOTE — Progress Notes (Signed)
Physical Therapy Treatment Patient Details Name: Andrew Chaney MRN: [id number] DOB: Feb 10, 1955 Today's Date: 05/02/2020    History of Present Illness Pt is 65 y.o. male who presented on 04/15/20 with left foot pain (recent calcaneus fx) with purulent drainage and rt lateral hip pressure ulcer. 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  Pt with medical history significant of bipolar disorder homelessness recent calcaneus fracture, history of MSSA bacteremia, cannabis abuse, seizure disorder, psychosis, chronic hepatitis C    PT Comments    Pt with steady progress with mobility. Able to amb to the door of room with walker. Pt needed to be reminded that in order to go to SNF he needs to participate in therapy. Opened blinds in room. Explained to pt that if room is totally dark 24/7 that his sleep patterns will remain disrupted.    Follow Up Recommendations  SNF     Equipment Recommendations  Wheelchair (measurements PT);Wheelchair cushion (measurements PT)    Recommendations for Other Services       Precautions / Restrictions Precautions Precautions: Fall Required Braces or Orthoses: Other Brace Other Brace: residual limb protector (Pt continues to adamantly refused use) Restrictions Weight Bearing Restrictions: Yes LLE Weight Bearing: Non weight bearing    Mobility  Bed Mobility Overal bed mobility: Modified Independent Bed Mobility: Supine to Sit     Supine to sit: Modified independent (Device/Increase time)          Transfers Overall transfer level: Needs assistance Equipment used: Rolling walker (2 wheeled);None Transfers: Sit to/from [employer] Sit to Stand: Min guard   Squat pivot transfers: Min guard     General transfer comment: Bed to chair with squat pivot. Sit to stand from chair with walker. Assist for safety  Ambulation/Gait Ambulation/Gait assistance: Min guard Gait Distance (Feet): 15 Feet Assistive device: Rolling  walker (2 wheeled) Gait Pattern/deviations: Step-to pattern (hop to) Gait velocity: decr Gait velocity interpretation: <1.31 ft/sec, indicative of household ambulator General Gait Details: Assist for safety. Verbal encouragement to continue   Stairs             Wheelchair Mobility    Modified Rankin (Stroke Patients Only)       Balance Overall balance assessment: Needs assistance Sitting-balance support: Feet supported;No upper extremity supported Sitting balance-Leahy Scale: Good     Standing balance support: Bilateral upper extremity supported;During functional activity Standing balance-Leahy Scale: Poor Standing balance comment: walker and min guard assist for static standing                            Cognition Arousal/Alertness: Awake/alert Behavior During Therapy: Anxious Overall Cognitive Status: No family/caregiver present to determine baseline cognitive functioning                                 General Comments: Pt needs repetition that he is capable of doing what we ask.      Exercises Amputee Exercises Knee Flexion: AROM;Left;10 reps;Seated Knee Extension: AROM;Left;10 reps;Seated    General Comments        Pertinent Vitals/Pain Pain Assessment: Faces Faces Pain Scale: Hurts a little bit Pain Location: LLE Pain Descriptors / Indicators: Grimacing Pain Intervention(s): Monitored during session;Limited activity within patient's tolerance    Home Living  Prior Function            PT Goals (current goals can now be found in the care plan section) Progress towards PT goals: Progressing toward goals    Frequency    Min 3X/week      PT Plan Current plan remains appropriate    Co-evaluation              AM-PAC PT "6 Clicks" Mobility   Outcome Measure  Help needed turning from your back to your side while in a flat bed without using bedrails?: None Help needed moving from  lying on your back to sitting on the side of a flat bed without using bedrails?: A Little Help needed moving to and from a bed to a chair (including a wheelchair)?: A Little Help needed standing up from a chair using your arms (e.g., wheelchair or bedside chair)?: A Little Help needed to walk in hospital room?: A Little Help needed climbing 3-5 steps with a railing? : Total 6 Click Score: 17    End of Session Equipment Utilized During Treatment: Gait belt Activity Tolerance: Patient tolerated treatment well Patient left: with call bell/phone within reach;in chair;with chair alarm set Nurse Communication: Mobility status PT Visit Diagnosis: Difficulty in walking, not elsewhere classified (R26.2);Pain Pain - Right/Left: Left Pain - part of body: Leg     Time: 1040-1054 PT Time Calculation (min) (ACUTE ONLY): 14 min  Charges:  $Gait Training: 8-22 mins                     [place] Pager [phone] Office [place] 05/02/2020, 12:15 PM

## 2020-05-02 NOTE — Progress Notes (Signed)
Pt still refusing another PIV.

## 2020-05-02 NOTE — Progress Notes (Signed)
CSW completed PASSR screening and uploaded documents for review.  Madilyn Fireman, MSW, LCSW Transitions of Care  Clinical Social Worker II [phone]

## 2020-05-03 LAB — COMPREHENSIVE METABOLIC PANEL
ALT: 20 U/L (ref 0–44)
AST: 19 U/L (ref 15–41)
Albumin: 2.9 g/dL — ABNORMAL LOW (ref 3.5–5.0)
Alkaline Phosphatase: 49 U/L (ref 38–126)
Anion gap: 7 (ref 5–15)
BUN: 22 mg/dL (ref 8–23)
CO2: 27 mmol/L (ref 22–32)
Calcium: 9.2 mg/dL (ref 8.9–10.3)
Chloride: 105 mmol/L (ref 98–111)
Creatinine, Ser: 0.66 mg/dL (ref 0.61–1.24)
GFR, Estimated: >60 mL/min (ref >60)
Glucose, Bld: 107 mg/dL — ABNORMAL HIGH (ref 70–99)
Potassium: 3.9 mmol/L (ref 3.5–5.1)
Sodium: 139 mmol/L (ref 135–145)
Total Bilirubin: 0.7 mg/dL (ref 0.3–1.2)
Total Protein: 5.8 g/dL — ABNORMAL LOW (ref 6.5–8.1)

## 2020-05-03 LAB — CBC
HCT: 36.5 % — ABNORMAL LOW (ref 39.0–52.0)
Hemoglobin: 11.7 g/dL — ABNORMAL LOW (ref 13.0–17.0)
MCH: 31.8 pg (ref 26.0–34.0)
MCHC: 32.1 g/dL (ref 30.0–36.0)
MCV: 99.2 fL (ref 80.0–100.0)
Platelets: 357 10*3/uL (ref 150–400)
RBC: 3.68 MIL/uL — ABNORMAL LOW (ref 4.22–5.81)
RDW: 13.9 % (ref 11.5–15.5)
WBC: 4.8 10*3/uL (ref 4.0–10.5)
nRBC: 0 % (ref 0.0–0.2)

## 2020-05-03 LAB — MAGNESIUM: Magnesium: 1.9 mg/dL (ref 1.7–2.4)

## 2020-05-03 LAB — C-REACTIVE PROTEIN: CRP: <0.5 mg/dL (ref <1.0)

## 2020-05-03 LAB — SEDIMENTATION RATE: Sed Rate: 5 mm/hr (ref 0–16)

## 2020-05-03 NOTE — Progress Notes (Signed)
[facility] PROGRESS NOTE  Andrew Chaney YIF:[id number] DOB: 02/05/1955 DOA: 04/15/2020 PCP: Patient, No Pcp Per (Inactive)  Status:  Remains inpatient appropriate because:Ongoing active pain requiring inpatient pain management, Unsafe d/c plan and IV treatments appropriate due to intensity of illness or inability to take PO   Dispo: The patient is from: Homeless              Anticipated d/c is to: SNF vs homeless shelter              Patient currently is not medically stable to d/c.   Difficult to place patient Yes              Barriers to DC: No insurance-inconsistent participation w/ therapy   Level of care: Progressive  Code Status: FULL Family Communication: Patient DVT prophylaxis: Lovenox Vaccination status: Unknown  Foley catheter: No  HPI: 65 y.o. male with history of bipolar disorder, MSSA bacteremia, chronic hepatitis C-who was diagnosed with left calcaneal fracture several months ago-homelessness-who was brought to the ED by [employer]-he was found to have osteomyelitis of his left foot-with septic shock-he was managed in the ICU by PCCM-upon stability-transfer to the [facility].  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  See below for further details.  Significant events: 4/5>> admit for left foot osteomyelitis 4/6>> septic shock-staph aureus bacteremia-PCCM consulted 4/6>> left BKA 4/11>> TEE  Antimicrobial therapy: Vancomycin: 4/5>> 4/19 Flagyl: 4/5>> 4/6 Cefepime: 4/5>> 4/6 Daptomycin 4/19 >> 4/22 Oral doxycycline 4/22  Microbiology data: 4/5>> blood culture: MRSA 4/7>> blood culture: No growth  Subjective:  Patient in bed, appears comfortable, denies any headache, no fever, no chest pain or pressure, no shortness of breath , no abdominal pain. No new focal weakness. Some post BKA site L. Stump  Pain, in no distress.   Objective:  Vitals:   05/02/20 2328 05/03/20 0337  BP: (!) 92/50 96/75  Pulse: 75 82  Resp: 16 16  Temp: 98.7 F (37.1  C) 98.9 F (37.2 C)  SpO2: 97% 98%    Intake/Output Summary (Last 24 hours) at 05/03/2020 1131 Last data filed at 05/03/2020 0900 Gross per 24 hour  Intake 717 ml  Output 200 ml  Net 517 ml   Filed Weights   04/15/20 1800  Weight: 71 kg    Exam:  Awake Alert, No new F.N deficits, Normal affect [place].AT,PERRAL Supple Neck,No JVD, No cervical lymphadenopathy appriciated.  Symmetrical Chest wall movement, Good air movement bilaterally, CTAB RRR,No Gallops, Rubs or new Murmurs, No Parasternal Heave +ve B.Sounds, Abd Soft, No tenderness, No organomegaly appriciated, No rebound - guarding or rigidity. No Cyanosis, L.BKA   Assessment/Plan:  Acute problems:  Septic shock due to MRSA bacteremia/left foot osteomyelitis with abscess involving his left foot:  Sepsis physiology has resolved-status post left BKA, TEE negative for vegetations. Got 2 weeks of antibiotics from date of negative blood cultures (4/7), last IV ABX day was 05/01/20 now on PO Doxy per ID for 30 days.  Right hip pressure ulcer/L BKA surgical wound:  Due to slipping on concrete--continue wound care. Pressure Injury 04/15/20 Hip Right Unstageable - Full thickness tissue loss in which the base of the injury is covered by slough (yellow, tan, gray, green or brown) and/or eschar (tan, brown or black) in the wound bed. 4x4 R hip  (Active)  Date First Assessed/Time First Assessed: 04/15/20 1900   Location: Hip  Location Orientation: Right  Staging: Unstageable - Full thickness tissue loss in which the base of the injury  is covered by slough (yellow, tan, gray, green or brown) and/or esch...    Assessments 04/15/2020  8:29 PM 05/03/2020  8:20 AM  Dressing -- Changed  Peri-wound Assessment Erythema (blanchable) --  Drainage Amount None --  Treatment Cleansed;Off loading --     No Linked orders to display     Incision (Closed) 04/16/20 Leg Left (Active)  Date First Assessed/Time First Assessed: 04/16/20 1708   Location: Leg   Location Orientation: Left    Assessments 04/16/2020  5:59 PM 05/03/2020  8:20 AM  Dressing Type Negative pressure wound therapy Other (Comment)  Dressing Clean;Dry;Intact Other (Comment)  Site / Wound Assessment Dressing in place / Unable to assess Clean;Dry  Margins -- Attached edges (approximated)  Closure -- Staples  Drainage Amount None None     No Linked orders to display  Increase Lyrica to 100 mg 3 times daily for postoperative neuropathic pain Continue low-dose oxycodone as needed  Bipolar disorder:  Previously noncompliant with medications -evaluated by psychiatry-started on lithium  Seizure disorder: Continue Keppra  Nutrition Problem: Nutrition Problem: Severe Malnutrition Etiology: acute illness Signs/Symptoms: moderate fat depletion,moderate muscle depletion Interventions: Refer to RD note for recommendations Estimated body mass index is 21.23 kg/m as calculated from the following:   Height as of this encounter: 6' (1.829 m).   Weight as of this encounter: 71 kg.   Physical debility  Continue PT and OT. Recommendation is for SNF Patient homeless prior to admission and not safe to return to street Patient had been inconsistently participating but on 4/20 after my attending physician also explained patient's discharge options if he did not participate in therapy he agreed to participate.  According to PT notes he progresses well with mobility and will likely be able to reach modified independent level if he continues to participate as recommended with PT.  PT evaluation 4/20: Bed Mobility Overal bed mobility: Modified Independent Bed Mobility: Supine to Sit Supine to sit: Modified independent (Device/Increase time)  Transfers Overall transfer level: Needs assistance Equipment used: Rolling walker (2 wheeled) Transfers: Sit to/from Stand;Lateral/Scoot Transfers Sit to Stand: Min guard  Lateral/Scoot Transfers: Supervision General transfer comment: lateral  scoot from bed to drop arm recliner with supervision. Sit to stand x 2 with walker from recliner. Verbal cues for hand placement  Ambulation/Gait General Gait Details: did not attempt due to 1 person assist Balance Overall balance assessment: Needs assistance Sitting-balance support: Feet supported;No upper extremity supported Sitting balance-Leahy Scale: Good Standing balance support: Bilateral upper extremity supported;During functional activity Standing balance-Leahy Scale: Poor Standing balance comment: walker and min guard assist for static standing    Cognition Arousal/Alertness: Awake/alert Behavior During Therapy: Anxious Overall Cognitive Status: No family/caregiver present to determine baseline cognitive functioning Area of Impairment: Memory;Safety/judgement;Problem solving Memory: Decreased short-term memory Safety/Judgement: Decreased awareness of safety    Exercises Amputee Exercises Hip ABduction/ADduction: AROM;Left;Supine;5 reps Hip Flexion/Marching: AROM;Left;5 reps;Supine Knee Flexion: Left;5 reps;Seated;AROM Knee Extension: Left;5 reps;Seated;AROM Straight Leg Raises: AROM;Left;Supine;5 reps     Other problems:  Vitamin B12 deficiency:  Continue weekly IM supplementation.  Normocytic anemia:  Due to acute illness-chronic osteomyelitis-follow CBC periodically  Chronic hepatitis C:  Per ID-plans are for outpatient treatment.   Data Reviewed:  Basic Metabolic Panel:  Recent Labs  Lab 04/29/20 0106 05/03/20 0808  NA 131* 139  K 3.0* 3.9  CL 95* 105  CO2 28 27  GLUCOSE 170* 107*  BUN <5* 22  CREATININE 0.58* 0.66  CALCIUM 8.3* 9.2  MG  --  1.9   Liver Function Tests:  Recent Labs  Lab 05/03/20 0808  AST 19  ALT 20  ALKPHOS 49  BILITOT 0.7  PROT 5.8*  ALBUMIN 2.9*   No results for input(s): LIPASE, AMYLASE in the last 168 hours. No results for input(s): AMMONIA in the last 168 hours. CBC: Recent Labs  Lab 04/29/20 0106  05/03/20 0808  WBC 11.8* 4.8  HGB 13.0 11.7*  HCT 38.5* 36.5*  MCV 85.7 99.2  PLT 177 357   Cardiac Enzymes: Recent Labs  Lab 05/01/20 1336  CKTOTAL 19*   BNP (last 3 results) Recent Labs    09/15/19 1023  BNP 57.8    ProBNP (last 3 results) No results for input(s): PROBNP in the last 8760 hours.  CBG: No results for input(s): GLUCAP in the last 168 hours.  No results found for this or any previous visit (from the past 240 hour(s)).   Studies: No results found.  Scheduled Meds: . (feeding supplement) PROSource Plus  30 mL Oral BID BM  . vitamin C  1,000 mg Oral Daily  . Chlorhexidine Gluconate Cloth  6 each Topical Daily  . cyanocobalamin  1,000 mcg Intramuscular Weekly  . docusate sodium  100 mg Oral Daily  . doxycycline  100 mg Oral Q12H  . enoxaparin (LOVENOX) injection  40 mg Subcutaneous Q24H  . feeding supplement  237 mL Oral TID BM  . folic acid  1 mg Oral Daily  . hydrOXYzine  50 mg Oral QHS  . levETIRAcetam  500 mg Oral BID  . lithium carbonate  300 mg Oral BID WC  . multivitamin with minerals  1 tablet Oral Daily  . pantoprazole  40 mg Oral Daily  . pregabalin  100 mg Oral TID  . senna  1 tablet Oral BID  . sodium chloride flush  10-40 mL Intracatheter Q12H  . thiamine  250 mg Oral Daily   Continuous Infusions: . sodium chloride Stopped (04/20/20 0858)  . magnesium sulfate bolus IVPB      Active Problems:   Hyponatremia   Chronic hepatitis C (HCC)   Bipolar disorder, most recent episode manic (HCC)   MRSA bacteremia   Subacute osteomyelitis, left ankle and foot (HCC)   Sepsis due to skin infection (HCC)   Cutaneous abscess of left foot   Left foot pain   IVDU (intravenous drug user)   Protein-calorie malnutrition, severe   S/P BKA (below knee amputation) unilateral, left ([place])   Consultants: PCCM, orthopedics, infectious disease, psychiatry  Procedures: 4/6>> left BKA (Dr. Sharol Given) 4/11>> TEE: No vegetations, EF 60-65%    Time  spent: 35 minutes  18  days  Signature  Lala Lund M.D on 05/03/2020 at 11:31 AM   -  To page go to [email or web address]

## 2020-05-04 NOTE — Progress Notes (Signed)
Occupational Therapy Evaluation Patient Details Name: Andrew Chaney MRN: [id number] DOB: [birth year] Today's Date: 05/04/2020    History of Present Illness Pt is 65 y.o. male who presented on 04/15/20 with left foot pain (recent calcaneus fx) with purulent drainage and rt lateral hip pressure ulcer. 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  Pt with medical history significant of bipolar disorder homelessness recent calcaneus fracture, history of MSSA bacteremia, cannabis abuse, seizure disorder, psychosis, chronic hepatitis C   Clinical Impression   Prior to hospitalization, pt was homeless, sleeping on the street and denying any assist from family or friends. Pt independently ambulated, utilized public restrooms for self-care, and occasionally bought food from a store or restaurant if given enough money. Today, pt received side lying in bed, pt agreeable to OT eval with encouragement. Pt presents with mild anxiety and stress. OT comforted pt and offered education on resources to assist him mentally/physically. Pt performed bed mobility with mod I, required supervision for sitting EOB, min guard for sit>stand from EOB using RW, min guard for stand pivot transfer to chair using RW, min assist-min guard for LB self-care, and setup-mod I for UB self-care. Educated pt on safe positioning, importance of wearing LLE residual limb protector, process of getting a prosthetic one day, safety awareness, RW safety management, adaptive ADL strategies, cognitive re-orientation, and d/c plans. Pt would benefit from continued skilled acute care OT services to maximize independence with ADLs/ADL mobility to ensure safety.     Follow Up Recommendations  SNF;Supervision/Assistance - 24 hour    Equipment Recommendations  Other (comment) (defer to next venue of care)    Recommendations for Other Services Other (comment) (psych eval)     Precautions / Restrictions Precautions Precautions:  Fall Required Braces or Orthoses: Other Brace Other Brace: residual limb protector (pt refuses to wear it because of discomfort) Restrictions Weight Bearing Restrictions: Yes LLE Weight Bearing: Non weight bearing      Mobility Bed Mobility Overal bed mobility: Modified Independent Bed Mobility: Supine to Sit     Supine to sit: Modified independent (Device/Increase time)     General bed mobility comments: mod I using bed railing    Transfers Overall transfer level: Needs assistance Equipment used: Rolling walker (2 wheeled) Transfers: Sit to/from [employer] Sit to Stand: Min guard Stand pivot transfers: Min guard       General transfer comment: min guard for sit>stand from EOB and stand pivot transfer from bed>chair using RW support    Balance Overall balance assessment: Mild deficits observed, not formally tested;Needs assistance Sitting-balance support: Feet supported;No upper extremity supported Sitting balance-Leahy Scale: Good Sitting balance - Comments: sitting EOB with supervision   Standing balance support: During functional activity;Bilateral upper extremity supported Standing balance-Leahy Scale: Fair Standing balance comment: walker and min guard assist for static/dynamic standing and pivoting to chair      ADL either performed or assessed with clinical judgement   ADL Overall ADL's : Needs assistance/impaired Eating/Feeding: Sitting;Independent (in chair)   Grooming: Set up;Sitting;Modified independent (in chair)   Upper Body Bathing: Set up;Modified independent;Sitting (simulated EOB)   Lower Body Bathing: Minimal assistance;Sitting/lateral leans;Sit to/from stand (simulated with RW support)   Upper Body Dressing : Modified independent;Sitting (for gown management sitting EOB)   Lower Body Dressing: Minimal assistance;Sitting/lateral leans;Sit to/from stand (simulated sitting EOB)   Toilet Transfer: Min guard;Stand-pivot (to  chair as a simulation using RW)   Toileting- Clothing Manipulation and Hygiene: Min guard;Sitting/lateral lean;Sit  to/from stand (simulated)   Scientist, research (medical):  (not assessed)   Functional mobility during ADLs: Min guard;Rolling walker General ADL Comments: min guard for sit>stand from bed and stand pivot transfers to chair using RW; no LOB; progressing well     Vision Baseline Vision/History: Wears glasses Wears Glasses: Reading only Patient Visual Report: No change from baseline Vision Assessment?: No apparent visual deficits     Perception Perception Perception Tested?: No   Praxis Praxis Praxis tested?: Not tested    Pertinent Vitals/Pain Pain Assessment: 0-10 Pain Score: 10-Worst pain ever Faces Pain Scale: Hurts a little bit Pain Location: LLE Pain Descriptors / Indicators: Guarding;Grimacing Pain Intervention(s): Limited activity within patient's tolerance;Monitored during session;Repositioned     Hand Dominance Right   Extremity/Trunk Assessment Upper Extremity Assessment Upper Extremity Assessment: Overall WFL for tasks assessed   Lower Extremity Assessment Lower Extremity Assessment: Overall WFL for tasks assessed LLE Deficits / Details: refusing to wear limb protector due to discomfort   Cervical / Trunk Assessment Cervical / Trunk Assessment: Normal   Communication Communication Communication: No difficulties   Cognition Arousal/Alertness: Awake/alert Behavior During Therapy: Anxious Overall Cognitive Status: No family/caregiver present to determine baseline cognitive functioning Area of Impairment: Memory;Safety/judgement;Problem solving     Memory: Decreased short-term memory   Safety/Judgement: Decreased awareness of safety   Problem Solving: Slow processing;Requires tactile cues;Requires verbal cues General Comments: requires repetition for accurate carryover   General Comments  incision site to LLE residual limb intact, no edema or  redness noted; pt soiled in sweat upon arrival, notified nurse to change bed while pt sat up in chair            Home Living Family/patient expects to be discharged to:: Shelter/Homeless   Additional Comments: Pt reports he sleeps on the "concrete"      Prior Functioning/Environment Level of Independence: Independent        Comments: denies use of crutches or equipment PTA        OT Problem List: Decreased strength;Decreased activity tolerance;Impaired balance (sitting and/or standing);Decreased cognition;Decreased safety awareness;Decreased knowledge of use of DME or AE;Pain      OT Treatment/Interventions: Self-care/ADL training;Therapeutic exercise;Neuromuscular education;Energy conservation;DME and/or AE instruction;Therapeutic activities;Cognitive remediation/compensation;Patient/family education;Balance training    OT Goals(Current goals can be found in the care plan section) Acute Rehab OT Goals Patient Stated Goal: decrease pain OT Goal Formulation: With patient Time For Goal Achievement: 05/18/20 Potential to Achieve Goals: Good  OT Frequency: Min 2X/week   Barriers to D/C: Other (comment) (homeless)  homeless          AM-PAC OT "6 Clicks" Daily Activity     Outcome Measure Help from another person eating meals?: None Help from another person taking care of personal grooming?: A Little Help from another person toileting, which includes using toliet, bedpan, or urinal?: A Little Help from another person bathing (including washing, rinsing, drying)?: A Little Help from another person to put on and taking off regular upper body clothing?: None Help from another person to put on and taking off regular lower body clothing?: A Little 6 Click Score: 20   End of Session Equipment Utilized During Treatment: Gait belt;Rolling walker Nurse Communication: Mobility status  Activity Tolerance: Patient tolerated treatment well Patient left: in chair;with call  bell/phone within reach  OT Visit Diagnosis: Unsteadiness on feet (R26.81);Muscle weakness (generalized) (M62.81);Pain Pain - Right/Left: Left Pain - part of body: Leg  Time: 3244-0102 OT Time Calculation (min): 29 min Charges:  OT General Charges $OT Visit: 1 Visit OT Evaluation $OT Eval Low Complexity: 1 Low OT Treatments $Therapeutic Activity: 8-22 mins  Michel Bickers, OTR/[facility] [phone]   Francesca Jewett 05/04/2020, 4:21 PM

## 2020-05-04 NOTE — Progress Notes (Signed)
[facility] PROGRESS NOTE  Andrew Chaney FYB:[id number] DOB: 20-Oct-1955 DOA: 04/15/2020 PCP: Patient, No Pcp Per (Inactive)  Status:  Remains inpatient appropriate because:Ongoing active pain requiring inpatient pain management, Unsafe d/c plan and IV treatments appropriate due to intensity of illness or inability to take PO   Dispo: The patient is from: Homeless              Anticipated d/c is to: SNF vs homeless shelter              Patient currently is not medically stable to d/c.   Difficult to place patient Yes              Barriers to DC: No insurance-inconsistent participation w/ therapy   Level of care: Progressive  Code Status: FULL Family Communication: Patient DVT prophylaxis: Lovenox Vaccination status: Unknown  Foley catheter: No  HPI: 65 y.o. male with history of bipolar disorder, MSSA bacteremia, chronic hepatitis C-who was diagnosed with left calcaneal fracture several months ago-homelessness-who was brought to the ED by [employer]-he was found to have osteomyelitis of his left foot-with septic shock-he was managed in the ICU by PCCM-upon stability-transfer to [facility].  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  See below for further details.  Significant events: 4/5>> admit for left foot osteomyelitis 4/6>> septic shock-staph aureus bacteremia-PCCM consulted 4/6>> left BKA 4/11>> TEE  Antimicrobial therapy: Vancomycin: 4/5>> 4/19 Flagyl: 4/5>> 4/6 Cefepime: 4/5>> 4/6 Daptomycin 4/19 >> 4/22 Oral doxycycline 4/22  Microbiology data: 4/5>> blood culture: MRSA 4/7>> blood culture: No growth  Subjective:  Patient in bed, appears comfortable, denies any headache, no fever, no chest pain or pressure, no shortness of breath , no abdominal pain. No new focal weakness.   Objective:  Vitals:   05/04/20 0405 05/04/20 0738  BP: 92/60 104/65  Pulse: 75 75  Resp: 18 16  Temp: 98.8 F (37.1 C) 98.1 F (36.7 C)  SpO2: 97% 98%     Intake/Output Summary (Last 24 hours) at 05/04/2020 1057 Last data filed at 05/04/2020 0739 Gross per 24 hour  Intake --  Output 1020 ml  Net -1020 ml   Filed Weights   04/15/20 1800  Weight: 71 kg    Exam:  Awake Alert, No new F.N deficits, Normal affect [place].AT,PERRAL Supple Neck,No JVD, No cervical lymphadenopathy appriciated.  Symmetrical Chest wall movement, Good air movement bilaterally, CTAB RRR,No Gallops, Rubs or new Murmurs, No Parasternal Heave +ve B.Sounds, Abd Soft, No tenderness, No organomegaly appriciated, No rebound - guarding or rigidity. No Cyanosis, L.BKA   Assessment/Plan:  Acute problems:  Septic shock due to MRSA bacteremia/left foot osteomyelitis with abscess involving his left foot:  Sepsis physiology has resolved-status post left BKA, TEE negative for vegetations. Got 2 weeks of antibiotics from date of negative blood cultures (4/7), last IV ABX day was 05/01/20 now on PO Doxy per ID for 30 days.  Right hip pressure ulcer/L BKA surgical wound:  Due to slipping on concrete--continue wound care. Pressure Injury 04/15/20 Hip Right Unstageable - Full thickness tissue loss in which the base of the injury is covered by slough (yellow, tan, gray, green or brown) and/or eschar (tan, brown or black) in the wound bed. 4x4 R hip  (Active)  Date First Assessed/Time First Assessed: 04/15/20 1900   Location: Hip  Location Orientation: Right  Staging: Unstageable - Full thickness tissue loss in which the base of the injury is covered by slough (yellow, tan, gray, green or brown) and/or esch.Marland KitchenMarland Kitchen  Assessments 04/15/2020  8:29 PM 05/03/2020  8:15 PM  Dressing Type -- Foam - Lift dressing to assess site every shift  Dressing -- Other (Comment)  Dressing Change Frequency -- Twice a day  State of Healing -- Early/partial granulation  Site / Wound Assessment -- Pink  Peri-wound Assessment Erythema (blanchable) Erythema (blanchable)  Drainage Amount None None  Treatment  Cleansed;Off loading --     No Linked orders to display     Incision (Closed) 04/16/20 Leg Left (Active)  Date First Assessed/Time First Assessed: 04/16/20 1708   Location: Leg  Location Orientation: Left    Assessments 04/16/2020  5:59 PM 05/03/2020  8:15 PM  Dressing Type Negative pressure wound therapy Other (Comment)  Dressing Clean;Dry;Intact --  Site / Wound Assessment Dressing in place / Unable to assess Clean;Dry  Margins -- Attached edges (approximated)  Drainage Amount None --     No Linked orders to display  Increase Lyrica to 100 mg 3 times daily for postoperative neuropathic pain Continue low-dose oxycodone as needed  Bipolar disorder:  Previously noncompliant with medications -evaluated by psychiatry-started on lithium  Seizure disorder: Continue Keppra  Nutrition Problem: Nutrition Problem: Severe Malnutrition Etiology: acute illness Signs/Symptoms: moderate fat depletion,moderate muscle depletion Interventions: Refer to RD note for recommendations Estimated body mass index is 21.23 kg/m as calculated from the following:   Height as of this encounter: 6' (1.829 m).   Weight as of this encounter: 71 kg.   Physical debility  Continue PT and OT. Recommendation is for SNF Patient homeless prior to admission and not safe to return to street Patient had been inconsistently participating but on 4/20 after my attending physician also explained patient's discharge options if he did not participate in therapy he agreed to participate.  According to PT notes he progresses well with mobility and will likely be able to reach modified independent level if he continues to participate as recommended with PT.  PT evaluation 4/20: Bed Mobility Overal bed mobility: Modified Independent Bed Mobility: Supine to Sit Supine to sit: Modified independent (Device/Increase time)  Transfers Overall transfer level: Needs assistance Equipment used: Rolling walker (2  wheeled) Transfers: Sit to/from Stand;Lateral/Scoot Transfers Sit to Stand: Min guard  Lateral/Scoot Transfers: Supervision General transfer comment: lateral scoot from bed to drop arm recliner with supervision. Sit to stand x 2 with walker from recliner. Verbal cues for hand placement  Ambulation/Gait General Gait Details: did not attempt due to 1 person assist Balance Overall balance assessment: Needs assistance Sitting-balance support: Feet supported;No upper extremity supported Sitting balance-Leahy Scale: Good Standing balance support: Bilateral upper extremity supported;During functional activity Standing balance-Leahy Scale: Poor Standing balance comment: walker and min guard assist for static standing    Cognition Arousal/Alertness: Awake/alert Behavior During Therapy: Anxious Overall Cognitive Status: No family/caregiver present to determine baseline cognitive functioning Area of Impairment: Memory;Safety/judgement;Problem solving Memory: Decreased short-term memory Safety/Judgement: Decreased awareness of safety    Exercises Amputee Exercises Hip ABduction/ADduction: AROM;Left;Supine;5 reps Hip Flexion/Marching: AROM;Left;5 reps;Supine Knee Flexion: Left;5 reps;Seated;AROM Knee Extension: Left;5 reps;Seated;AROM Straight Leg Raises: AROM;Left;Supine;5 reps     Other problems:  Vitamin B12 deficiency:  Continue weekly IM supplementation.  Normocytic anemia:  Due to acute illness-chronic osteomyelitis-follow CBC periodically  Chronic hepatitis C:  Per ID-plans are for outpatient treatment.   Data Reviewed:  Basic Metabolic Panel:  Recent Labs  Lab 04/29/20 0106 05/03/20 0808  NA 131* 139  K 3.0* 3.9  CL 95* 105  CO2 28 27  GLUCOSE 170* 107*  BUN <5* 22  CREATININE 0.58* 0.66  CALCIUM 8.3* 9.2  MG  --  1.9   Liver Function Tests:  Recent Labs  Lab 05/03/20 0808  AST 19  ALT 20  ALKPHOS 49  BILITOT 0.7  PROT 5.8*  ALBUMIN 2.9*    No results for input(s): LIPASE, AMYLASE in the last 168 hours. No results for input(s): AMMONIA in the last 168 hours. CBC: Recent Labs  Lab 04/29/20 0106 05/03/20 0808  WBC 11.8* 4.8  HGB 13.0 11.7*  HCT 38.5* 36.5*  MCV 85.7 99.2  PLT 177 357   Cardiac Enzymes: Recent Labs  Lab 05/01/20 1336  CKTOTAL 19*   BNP (last 3 results) Recent Labs    09/15/19 1023  BNP 57.8    ProBNP (last 3 results) No results for input(s): PROBNP in the last 8760 hours.  CBG: No results for input(s): GLUCAP in the last 168 hours.  No results found for this or any previous visit (from the past 240 hour(s)).   Studies: No results found.  Scheduled Meds: . (feeding supplement) PROSource Plus  30 mL Oral BID BM  . vitamin C  1,000 mg Oral Daily  . Chlorhexidine Gluconate Cloth  6 each Topical Daily  . cyanocobalamin  1,000 mcg Intramuscular Weekly  . docusate sodium  100 mg Oral Daily  . doxycycline  100 mg Oral Q12H  . enoxaparin (LOVENOX) injection  40 mg Subcutaneous Q24H  . feeding supplement  237 mL Oral TID BM  . folic acid  1 mg Oral Daily  . hydrOXYzine  50 mg Oral QHS  . levETIRAcetam  500 mg Oral BID  . lithium carbonate  300 mg Oral BID WC  . multivitamin with minerals  1 tablet Oral Daily  . pantoprazole  40 mg Oral Daily  . pregabalin  100 mg Oral TID  . senna  1 tablet Oral BID  . sodium chloride flush  10-40 mL Intracatheter Q12H  . thiamine  250 mg Oral Daily   Continuous Infusions: . sodium chloride Stopped (04/20/20 0858)  . magnesium sulfate bolus IVPB      Active Problems:   Hyponatremia   Chronic hepatitis C (HCC)   Bipolar disorder, most recent episode manic (HCC)   MRSA bacteremia   Subacute osteomyelitis, left ankle and foot (HCC)   Sepsis due to skin infection (HCC)   Cutaneous abscess of left foot   Left foot pain   IVDU (intravenous drug user)   Protein-calorie malnutrition, severe   S/P BKA (below knee amputation) unilateral, left  ([place])   Consultants: PCCM, orthopedics, infectious disease, psychiatry  Procedures: 4/6>> left BKA (Dr. Sharol Given) 4/11>> TEE: No vegetations, EF 60-65%    Time spent: 35 minutes  19  days  Signature  Lala Lund M.D on 05/04/2020 at 10:57 AM   -  To page go to [email or web address]

## 2020-05-05 NOTE — Progress Notes (Signed)
[facility] PROGRESS NOTE  Andrew Chaney XLK:[id number] DOB: Jan 06, 1956 DOA: 04/15/2020 PCP: Patient, No Pcp Per (Inactive)  Status:  Remains inpatient appropriate because:Ongoing active pain requiring inpatient pain management, Unsafe d/c plan and IV treatments appropriate due to intensity of illness or inability to take PO   Dispo: The patient is from: Homeless              Anticipated d/c is to: SNF vs homeless shelter              Patient currently is not medically stable to d/c.   Difficult to place patient Yes              Barriers to DC: No insurance-inconsistent participation w/ therapy   Level of care: Progressive  Code Status: FULL Family Communication: Patient DVT prophylaxis: Lovenox Vaccination status: Unknown  Foley catheter: No  HPI: 65 y.o. male with history of bipolar disorder, MSSA bacteremia, chronic hepatitis C-who was diagnosed with left calcaneal fracture several months ago-homelessness-who was brought to the ED by [employer]-he was found to have osteomyelitis of his left foot-with septic shock-he was managed in the ICU by PCCM-upon stability-transfer [facility].  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  See below for further details.  Significant events: 4/5>> admit for left foot osteomyelitis 4/6>> septic shock-staph aureus bacteremia-PCCM consulted 4/6>> left BKA 4/11>> TEE  Antimicrobial therapy: Vancomycin: 4/5>> 4/19 Flagyl: 4/5>> 4/6 Cefepime: 4/5>> 4/6 Daptomycin 4/19 >> 4/22 Oral doxycycline 4/22  Microbiology data: 4/5>> blood culture: MRSA 4/7>> blood culture: No growth  Subjective: Sleeping soundly and did not awaken during exam or when spoken to.  Objective: Vitals:   05/04/20 2325 05/05/20 0507  BP: 101/68 97/65  Pulse: 67 65  Resp: 19 19  Temp: 97.7 F (36.5 C) 97.9 F (36.6 C)  SpO2: 97% 97%    Intake/Output Summary (Last 24 hours) at 05/05/2020 0800 Last data filed at 05/05/2020 0000 Gross per 24 hour   Intake --  Output 520 ml  Net -520 ml   Filed Weights   04/15/20 1800  Weight: 71 kg    Exam:  Constitutional: Sleeping soundly and appears to be comfortable and in no acute distress Respiratory: Posterior lung sounds are clear to auscultation.  Room air. Cardiovascular: S1-S2, regular pulse, no peripheral edema, skin warm and dry Abdomen: LBM 4/24, soft and nondistended with normoactive bowel sounds Musculoskeletal: LEFT BKA -site unremarkable with some staples in place Neurologic: CN 2-12 grossly intact. Sensation intact, DTR normal. Strength 5/5 x all 4 extremities.  Psychiatric: Asleep but at baseline is alert and oriented x3.  Pleasant.  Limited insight into current medical status   Assessment/Plan: Acute problems: Septic shock due to MRSA bacteremia/left foot osteomyelitis with abscess involving his left foot:  Sepsis physiology has resolved-status post left BKA -Pharmacist spoke with ID who recommends changing from IV vancomycin to daptomycin before transitioning to oral doxycycline -TEE negative for vegetations.   -Needs 2 weeks of antibiotics from date of negative blood cultures (4/7).  Although 4/21 is last day of IV antibiotics i.e. 14 days pharmacy has discussed with ID and they prefer patient to remain on IV antibiotics as long as possible i.e. while hospitalized then transition to oral antibiotics after discharge. -4/22 patient's IV dislodged.  Patient refusing multiple attempts by staff to place IV.  I counseled patient but he insists on no further IV placement and wants all of his medications by mouth.  IV daptomycin discontinued in favor of doxycycline.  Will need doxycycline for 30 days as recommended by ID.  Recent CK was normal. CRP less than 0.5 on 4/23 and ESR was 5  Right hip pressure ulcer/L BKA surgical wound:  Due to slipping on concrete--continue wound care. Pressure Injury 04/15/20 Hip Right Unstageable - Full thickness tissue loss in which the base of  the injury is covered by slough (yellow, tan, gray, green or brown) and/or eschar (tan, brown or black) in the wound bed. 4x4 R hip  (Active)  Date First Assessed/Time First Assessed: 04/15/20 1900   Location: Hip  Location Orientation: Right  Staging: Unstageable - Full thickness tissue loss in which the base of the injury is covered by slough (yellow, tan, gray, green or brown) and/or esch...    Assessments 04/15/2020  8:29 PM 05/05/2020  7:24 AM  Dressing Type -- Foam - Lift dressing to assess site every shift  Dressing -- Clean;Dry;Intact  Peri-wound Assessment Erythema (blanchable) --  Drainage Amount None --  Treatment Cleansed;Off loading --     No Linked orders to display     Incision (Closed) 04/16/20 Leg Left (Active)  Date First Assessed/Time First Assessed: 04/16/20 1708   Location: Leg  Location Orientation: Left    Assessments 04/16/2020  5:59 PM 05/05/2020  7:24 AM  Dressing Type Negative pressure wound therapy None  Dressing Clean;Dry;Intact --  Site / Wound Assessment Dressing in place / Unable to assess Clean;Dry  Margins -- Attached edges (approximated)  Closure -- Staples  Drainage Amount None None     No Linked orders to display  Continue Lyrica to 100 mg 3 times daily for postoperative neuropathic pain Continue low-dose oxycodone as needed  Bipolar disorder:  Previously noncompliant with medications -evaluated by psychiatry-started on lithium  Hypokalemia K 3.0-repleted 4/23 up to 3.9 Follow labs  Seizure disorder:  Continue Keppra  Nutrition Problem: Nutrition Problem: Severe Malnutrition Etiology: acute illness Signs/Symptoms: moderate fat depletion,moderate muscle depletion Interventions: Refer to RD note for recommendations Estimated body mass index is 21.23 kg/m as calculated from the following:   Height as of this encounter: 6' (1.829 m).   Weight as of this encounter: 71 kg.   Physical debility Continue PT and OT. Recommendation is for  SNF Patient homeless prior to admission and not safe to return to street Patient had been inconsistently participating but on 4/20 after my attending physician also explained patient's discharge options if he did not participate in therapy he agreed to participate.  According to PT notes he progresses well with mobility and will likely be able to reach modified independent level if he continues to participate as recommended with PT.  PT evaluation 4/22: Pt with steady progress with mobility. Able to amb to the door of room with walker. Pt needed to be reminded that in order to go to SNF he needs to participate in therapy. Opened blinds in room. Explained to pt that if room is totally dark 24/7 that his sleep patterns will remain disrupted.    OT evaluation 4/24 Prior to hospitalization, pt was homeless, sleeping on the street and denying any assist from family or friends. Pt independently ambulated, utilized public restrooms for self-care, and occasionally bought food from a store or restaurant if given enough money. Today, pt received side lying in bed, pt agreeable to OT eval with encouragement. Pt presents with mild anxiety and stress. OT comforted pt and offered education on resources to assist him mentally/physically. Pt performed bed mobility with mod I, required supervision for sitting  EOB, min guard for sit>stand from EOB using RW, min guard for stand pivot transfer to chair using RW, min assist-min guard for LB self-care, and setup-mod I for UB self-care. Educated pt on safe positioning, importance of wearing LLE residual limb protector, process of getting a prosthetic one day, safety awareness, RW safety management, adaptive ADL strategies, cognitive re-orientation, and d/c plans. Pt would benefit from continued skilled acute care OT services to maximize independence with ADLs/ADL mobility to ensure safety.     Other problems: Vitamin B12 deficiency:  Continue weekly IM  supplementation.  Hyponatremia:  Resolved  Normocytic anemia:  Due to acute illness-chronic osteomyelitis-follow CBC periodically  Chronic hepatitis C:  Per ID-plans are for outpatient treatment.   Data Reviewed: Basic Metabolic Panel: Recent Labs  Lab 04/29/20 0106 05/03/20 0808  NA 131* 139  K 3.0* 3.9  CL 95* 105  CO2 28 27  GLUCOSE 170* 107*  BUN <5* 22  CREATININE 0.58* 0.66  CALCIUM 8.3* 9.2  MG  --  1.9   Liver Function Tests: Recent Labs  Lab 05/03/20 0808  AST 19  ALT 20  ALKPHOS 49  BILITOT 0.7  PROT 5.8*  ALBUMIN 2.9*   No results for input(s): LIPASE, AMYLASE in the last 168 hours. No results for input(s): AMMONIA in the last 168 hours. CBC: Recent Labs  Lab 04/29/20 0106 05/03/20 0808  WBC 11.8* 4.8  HGB 13.0 11.7*  HCT 38.5* 36.5*  MCV 85.7 99.2  PLT 177 357   Cardiac Enzymes: Recent Labs  Lab 05/01/20 1336  CKTOTAL 19*   BNP (last 3 results) Recent Labs    09/15/19 1023  BNP 57.8    ProBNP (last 3 results) No results for input(s): PROBNP in the last 8760 hours.  CBG: No results for input(s): GLUCAP in the last 168 hours.  No results found for this or any previous visit (from the past 240 hour(s)).   Studies: No results found.  Scheduled Meds: . (feeding supplement) PROSource Plus  30 mL Oral BID BM  . vitamin C  1,000 mg Oral Daily  . Chlorhexidine Gluconate Cloth  6 each Topical Daily  . cyanocobalamin  1,000 mcg Intramuscular Weekly  . docusate sodium  100 mg Oral Daily  . doxycycline  100 mg Oral Q12H  . enoxaparin (LOVENOX) injection  40 mg Subcutaneous Q24H  . feeding supplement  237 mL Oral TID BM  . folic acid  1 mg Oral Daily  . hydrOXYzine  50 mg Oral QHS  . levETIRAcetam  500 mg Oral BID  . lithium carbonate  300 mg Oral BID WC  . multivitamin with minerals  1 tablet Oral Daily  . pantoprazole  40 mg Oral Daily  . pregabalin  100 mg Oral TID  . senna  1 tablet Oral BID  . thiamine  250 mg Oral  Daily   Continuous Infusions: . sodium chloride Stopped (04/20/20 0858)  . magnesium sulfate bolus IVPB      Active Problems:   Hyponatremia   Chronic hepatitis C (HCC)   Bipolar disorder, most recent episode manic (HCC)   MRSA bacteremia   Subacute osteomyelitis, left ankle and foot (HCC)   Sepsis due to skin infection (HCC)   Cutaneous abscess of left foot   Left foot pain   IVDU (intravenous drug user)   Protein-calorie malnutrition, severe   S/P BKA (below knee amputation) unilateral, left ([place])   Consultants: PCCM, orthopedics, infectious disease, psychiatry  Procedures: 4/6>> left BKA (  Dr. Sharol Given) 4/11>> TEE: No vegetations, EF 60-65%   Antibiotics: Anti-infectives (From admission, onward)   Start     Dose/Rate Route Frequency Ordered Stop   05/02/20 1000  doxycycline (VIBRA-TABS) tablet 100 mg  Status:  Discontinued        100 mg Oral Every 12 hours 05/01/20 1204 05/01/20 1301   05/02/20 1000  doxycycline (VIBRA-TABS) tablet 100 mg        100 mg Oral Every 12 hours 05/02/20 0901 06/01/20 0959   05/01/20 2000  DAPTOmycin (CUBICIN) 500 mg in sodium chloride 0.9 % IVPB  Status:  Discontinued       Note to Pharmacy: It appears last dose is due today.  I have already ordered the doxycycline to begin tomorrow for a total of 30 days as recommended by ID.   500 mg 120 mL/hr over 30 Minutes Intravenous Daily 05/01/20 1205 05/02/20 0901   04/29/20 1400  DAPTOmycin (CUBICIN) 500 mg in sodium chloride 0.9 % IVPB  Status:  Discontinued        500 mg 120 mL/hr over 30 Minutes Intravenous Daily 04/29/20 1252 05/01/20 1205   04/28/20 1800  vancomycin (VANCOREADY) IVPB 1000 mg/200 mL  Status:  Discontinued        1,000 mg 200 mL/hr over 60 Minutes Intravenous Every 8 hours 04/28/20 1541 04/29/20 1252   04/23/20 0130  linezolid (ZYVOX) tablet 600 mg  Status:  Discontinued        600 mg Oral Every 12 hours 04/23/20 0039 04/23/20 0854   04/16/20 0630  vancomycin (VANCOREADY) IVPB  1000 mg/200 mL  Status:  Discontinued       "Followed by" Linked Group Details   1,000 mg 200 mL/hr over 60 Minutes Intravenous Every 12 hours 04/15/20 1824 04/28/20 1400   04/16/20 0200  metroNIDAZOLE (FLAGYL) IVPB 500 mg  Status:  Discontinued        500 mg 100 mL/hr over 60 Minutes Intravenous Every 8 hours 04/15/20 1858 04/17/20 0945   04/15/20 1830  ceFEPIme (MAXIPIME) 2 g in sodium chloride 0.9 % 100 mL IVPB  Status:  Discontinued        2 g 200 mL/hr over 30 Minutes Intravenous Every 8 hours 04/15/20 1824 04/17/20 0849   04/15/20 1830  vancomycin (VANCOREADY) IVPB 1500 mg/300 mL       "Followed by" Linked Group Details   1,500 mg 150 mL/hr over 120 Minutes Intravenous  Once 04/15/20 1824 04/16/20 0003   04/15/20 1745  metroNIDAZOLE (FLAGYL) IVPB 500 mg        500 mg 100 mL/hr over 60 Minutes Intravenous  Once 04/15/20 1737 04/15/20 1914       Time spent: 35 minutes    Erin Hearing ANP  [facility] 7 am - 330 pm/M-F for direct patient care and secure chat Please refer to Amion for contact info 20  days

## 2020-05-05 NOTE — Progress Notes (Addendum)
Patient will require a short-term nursing home stay - anticipated 30 days or less of rehab at a [facility].  Madilyn Fireman, MSW, LCSW Transitions of Care  Clinical Social Worker II [phone]

## 2020-05-05 NOTE — Progress Notes (Signed)
Physical Therapy Treatment Patient Details Name: Andrew Chaney MRN: [id number] DOB: 1955-03-29 Today's Date: 05/05/2020    History of Present Illness Pt is 65 y.o. male who presented on 04/15/20 with left foot pain (recent calcaneus fx) with purulent drainage and rt lateral hip pressure ulcer. 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  Pt with medical history significant of bipolar disorder homelessness recent calcaneus fracture, history of MSSA bacteremia, cannabis abuse, seizure disorder, psychosis, chronic hepatitis C    PT Comments    Pt agreeable to PT session on second attempt today. Pt ambulatory in room with use of RW x20 ft, limited by reported severe L residual limb pain. Pt tolerated BKA exercises well, although fatigues quickly. PT educated pt on phantom limb pain, given pt's complaints of phantom pain. PT to continue to progress mobility as able.    Follow Up Recommendations  SNF     Equipment Recommendations  Wheelchair (measurements PT);Wheelchair cushion (measurements PT)    Recommendations for Other Services       Precautions / Restrictions Precautions Precautions: Fall Required Braces or Orthoses: Other Brace Other Brace: residual limb protector - pt refused to wear Restrictions Weight Bearing Restrictions: Yes LLE Weight Bearing: Non weight bearing    Mobility  Bed Mobility Overal bed mobility: Needs Assistance Bed Mobility: Supine to Sit;Sit to Supine     Supine to sit: Supervision Sit to supine: Supervision   General bed mobility comments: for safety, use of bedrail to perform    Transfers Overall transfer level: Needs assistance Equipment used: Rolling walker (2 wheeled) Transfers: Sit to/from Stand Sit to Stand: Min guard;From elevated surface         General transfer comment: for safety, VC for hand placement when rising and slow eccentric sit.  Ambulation/Gait Ambulation/Gait assistance: Min guard Gait Distance (Feet):  20 Feet Assistive device: Rolling walker (2 wheeled) Gait Pattern/deviations: Step-to pattern Gait velocity: decr   General Gait Details: hop-to gait with good power in tricep extension during hop phase. Pt slowing at halfway point of gait and beyond secondary to fatigue and pain.   Stairs             Wheelchair Mobility    Modified Rankin (Stroke Patients Only)       Balance Overall balance assessment: Mild deficits observed, not formally tested;Needs assistance Sitting-balance support: Feet supported;No upper extremity supported Sitting balance-Leahy Scale: Good Sitting balance - Comments: sitting EOB with supervision   Standing balance support: During functional activity;Bilateral upper extremity supported Standing balance-Leahy Scale: Fair Standing balance comment: reliant on external support                            Cognition Arousal/Alertness: Awake/alert Behavior During Therapy: Flat affect Overall Cognitive Status: No family/caregiver present to determine baseline cognitive functioning Area of Impairment: Memory;Safety/judgement;Problem solving                     Memory: Decreased short-term memory   Safety/Judgement: Decreased awareness of safety   Problem Solving: Slow processing;Requires tactile cues;Requires verbal cues General Comments: pt is self-limiting, requires mod encouragement      Exercises Amputee Exercises Quad Sets: AROM;Left;Supine;5 reps (against towel roll) Hip Extension: AROM;Left;10 reps;Supine Hip ABduction/ADduction: AROM;Left;10 reps;Supine Knee Flexion: AROM;Left;10 reps;Supine Knee Extension: Left;10 reps;AROM;Seated (LAQ)    General Comments General comments (skin integrity, edema, etc.): pt sweating and sheets wet with perspiration, declines bedding change  Pertinent Vitals/Pain Pain Assessment: 0-10 Pain Score: 10-Worst pain ever Pain Location: LLE Pain Descriptors / Indicators:  Guarding;Grimacing;Sore Pain Intervention(s): Limited activity within patient's tolerance;Monitored during session;Repositioned;Patient requesting pain meds-RN notified    Home Living                      Prior Function            PT Goals (current goals can now be found in the care plan section) Acute Rehab PT Goals Patient Stated Goal: decrease pain PT Goal Formulation: With patient Time For Goal Achievement: 05/14/20 Potential to Achieve Goals: Good Progress towards PT goals: Progressing toward goals    Frequency    Min 3X/week      PT Plan Current plan remains appropriate    Co-evaluation              AM-PAC PT "6 Clicks" Mobility   Outcome Measure  Help needed turning from your back to your side while in a flat bed without using bedrails?: None Help needed moving from lying on your back to sitting on the side of a flat bed without using bedrails?: A Little Help needed moving to and from a bed to a chair (including a wheelchair)?: A Little Help needed standing up from a chair using your arms (e.g., wheelchair or bedside chair)?: A Little Help needed to walk in hospital room?: A Little Help needed climbing 3-5 steps with a railing? : A Lot 6 Click Score: 18    End of Session   Activity Tolerance: Patient tolerated treatment well Patient left: with call bell/phone within reach;in bed;with bed alarm set Nurse Communication: Mobility status;Patient requests pain meds PT Visit Diagnosis: Difficulty in walking, not elsewhere classified (R26.2);Pain Pain - Right/Left: Left Pain - part of body: Leg     Time: 5631-4970 PT Time Calculation (min) (ACUTE ONLY): 19 min  Charges:  $Gait Training: 8-22 mins                     Stacie Glaze, PT DPT Acute Rehabilitation Services Pager [phone]  Office [phone]    Louis Matte 05/05/2020, 4:42 PM

## 2020-05-05 NOTE — TOC Progression Note (Signed)
Transition of Care [facility]) - Progression Note    Patient Details  Name: Andrew Chaney MRN: [id number] Date of Birth: [birth year]  Transition of Care [facility]) CM/SW [place], RN Phone Number: 05/05/2020, 11:27 AM  Clinical Narrative:    CM noted that PASSR level 2 was completed and the patient was faxed out in the hub to area facilities to find SNF placement for the patient at this time.  The patient is recent S/P Left BKA on 04/16/2020 with history of homelessness and substance abuse prior to admission that may be a barrier to finding SNF placement.  CM and MSW will continue to explore options for [facility] placement.   Expected Discharge Plan: [place] Barriers to Discharge: Inadequate or no insurance,No SNF bed,SNF Pending Medicaid,Active Substance Use - Placement,Homeless with medical needs  Expected Discharge Plan and Services Expected Discharge Plan: [place] In-house Referral: Clinical Social Work,Financial Counselor     Living arrangements for the past 2 months: Homeless                                       Social Determinants of Health (SDOH) Interventions    Readmission Risk Interventions Readmission Risk Prevention Plan 04/25/2020  Transportation Screening Complete  Medication Review Press photographer) Referral to Pharmacy  PCP or Specialist appointment within 3-5 days of discharge Complete  HRI or Home Care Consult Complete  SW Recovery Care/Counseling Consult Complete  Palliative Care Screening Not [place] Complete  Some recent data might be hidden

## 2020-05-05 NOTE — Progress Notes (Addendum)
CSW obtained PASSR number - # [id number] E.  CSW spoke with Alma Friendly at [facility] in [place] - she is out of the office today but is willing to review referral. CSW sent referral via secure e-mail.  Madilyn Fireman, MSW, LCSW Transitions of Care  Clinical Social Worker II [phone]

## 2020-05-06 MED ORDER — LIDOCAINE 5 % EX PTCH
1.0000 | MEDICATED_PATCH | CUTANEOUS | Status: DC
  Administered 2020-05-06: 1 via TRANSDERMAL
  Filled 2020-05-06 (×3): qty 1

## 2020-05-06 MED ORDER — PREGABALIN 75 MG PO CAPS
150.0000 mg | ORAL_CAPSULE | Freq: Three times a day (TID) | ORAL | Status: DC
  Administered 2020-05-06 – 2020-05-09 (×11): 150 mg via ORAL
  Filled 2020-05-06 (×11): qty 2

## 2020-05-06 MED ORDER — IBUPROFEN 600 MG PO TABS
600.0000 mg | ORAL_TABLET | Freq: Once | ORAL | Status: AC
  Administered 2020-05-06: 600 mg via ORAL
  Filled 2020-05-06: qty 1

## 2020-05-06 MED ORDER — DOCUSATE SODIUM 100 MG PO CAPS
100.0000 mg | ORAL_CAPSULE | Freq: Two times a day (BID) | ORAL | Status: DC
  Administered 2020-05-06: 100 mg via ORAL
  Filled 2020-05-06 (×5): qty 1

## 2020-05-06 NOTE — TOC Progression Note (Signed)
Transition of Care (TOC) - Progression Note    Patient Details  Name: Andrew Chaney MRN: [id number] Date of Birth: 06/26/1955  Transition of Care (TOC) CM/SW Contact   [place], RN Phone Number: 05/06/2020, 12:40 PM  Clinical Narrative:    Case management met with the patient at the bedside in regards to transitions of care needs.  The patient with S/P Left BKA on 04/16/2020 with stables intact to wound incision with no present wound vac.  The patient is aware that he is waiting on SNF placement and does not have any present bed offers at this time.  The patient's PASSR was updated yesterday and patient was faxed out to numerous facilities with no bed offers at this time.  Carol Watson, MSW spoke with Lydia, CM at [place] [place] facility and she is checking patient's clinicals for possible consideration for admission.      Expected Discharge Plan: Skilled Nursing Facility Barriers to Discharge: Inadequate or no insurance,No SNF bed,SNF Pending Medicaid,Active Substance Use - Placement,Homeless with medical needs  Expected Discharge Plan and Services Expected Discharge Plan: Skilled Nursing Facility In-house Referral: Clinical Social Work Discharge Planning Services: CM Consult Post Acute Care Choice: Skilled Nursing Facility Living arrangements for the past 2 months: Homeless                                       Social Determinants of Health (SDOH) Interventions    Readmission Risk Interventions Readmission Risk Prevention Plan 04/25/2020  Transportation Screening Complete  Medication Review (RN Care Manager) Referral to Pharmacy  PCP or Specialist appointment within 3-5 days of discharge Complete  HRI or Home Care Consult Complete  SW Recovery Care/Counseling Consult Complete  Palliative Care Screening Not Applicable  Skilled Nursing Facility Complete  Some recent data might be hidden   

## 2020-05-06 NOTE — Consult Note (Signed)
WOC Nurse Consult Note: Patient receiving care in [facility] [phone]. Reason for Consult: f/u care to right hip/thigh wound Wound type: superficial, just needs to continue re-epithelialization Pressure Injury POA: Yes/No/NA Measurement: Wound bed: 100% clean, pink Drainage (amount, consistency, odor) none Periwound: intact Dressing procedure/placement/frequency: foam dressing to area, change every 3 days. Was formerly unstageable and with a prior order for dakin's solution WOC nurse will not follow at this time.  Please re-consult the [place] team if needed.  Val Riles, RN, MSN, CWOCN, CNS-BC, pager [phone]

## 2020-05-06 NOTE — Progress Notes (Signed)
Physical Therapy Treatment Patient Details Name: Andrew Chaney MRN: [id number] DOB: 10-19-1955 Today's Date: 05/06/2020    History of Present Illness Pt is 65 y.o. male who presented on 04/15/20 with left foot pain (recent calcaneus fx) with purulent drainage and rt lateral hip pressure ulcer. 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  Pt remains hospitalized due to difficult placement. Pt with medical history significant of bipolar disorder homelessness recent calcaneus fracture, history of MSSA bacteremia, cannabis abuse, seizure disorder, psychosis, chronic hepatitis C    PT Comments    Pt making gradual progress.  Able to increase gait distance with encouragement.  Requiring cues for controlled movements with transfers and exercises.  Does still have some self limiting behaviors but did participate with therapy.  Continues to have decreased safety awareness and recommend SNF at d/c due to pt homeless with no support.     Follow Up Recommendations  SNF     Equipment Recommendations  Wheelchair (measurements PT);Wheelchair cushion (measurements PT)    Recommendations for Other Services Rehab consult     Precautions / Restrictions Precautions Precautions: Fall Other Brace: residual limb protector - pt refused to wear Restrictions LLE Weight Bearing: Non weight bearing    Mobility  Bed Mobility Overal bed mobility: Needs Assistance Bed Mobility: Supine to Sit;Sit to Supine     Supine to sit: Supervision Sit to supine: Supervision        Transfers Overall transfer level: Needs assistance Equipment used: Rolling walker (2 wheeled) Transfers: Sit to/from Stand Sit to Stand: Supervision         General transfer comment: VC for hand placement and controlled descent  Ambulation/Gait Ambulation/Gait assistance: Min guard Gait Distance (Feet): 40 Feet Assistive device: Rolling walker (2 wheeled) Gait Pattern/deviations: Step-to pattern     General  Gait Details: hop-to gait with good power in tricep extension during hop phase; min cues for RW proximity and encouragement to increase distance   Stairs             Wheelchair Mobility    Modified Rankin (Stroke Patients Only)       Balance Overall balance assessment: Mild deficits observed, not formally tested;Needs assistance Sitting-balance support: Feet supported;No upper extremity supported Sitting balance-Leahy Scale: Good Sitting balance - Comments: sitting EOB with supervision   Standing balance support: During functional activity;Bilateral upper extremity supported Standing balance-Leahy Scale: Fair Standing balance comment: Used RW and balanced on R leg                            Cognition Arousal/Alertness: Awake/alert Behavior During Therapy: Flat affect Overall Cognitive Status: No family/caregiver present to determine baseline cognitive functioning Area of Impairment: Memory;Safety/judgement;Problem solving                     Memory: Decreased short-term memory   Safety/Judgement: Decreased awareness of safety   Problem Solving: Slow processing;Requires tactile cues;Requires verbal cues General Comments: pt is self-limiting, requires mod encouragement - low motivation      Exercises Amputee Exercises Quad Sets: AROM;Left;Supine;10 reps Hip Extension: AROM;Left;10 reps;Sidelying Hip ABduction/ADduction: AROM;Left;10 reps;Supine Knee Flexion: AROM;Left;10 reps;Supine Knee Extension: Left;10 reps;AROM;Seated Straight Leg Raises: AROM;Left;Supine;10 reps Other Exercises Other Exercises: cues for slow controlled movements with exercise Other Exercises: Sit to stand x 5 with cues for hand placement and close supervision    General Comments        Pertinent Vitals/Pain Pain  Assessment: Faces Faces Pain Scale: Hurts little more Pain Location: LLE, more his head Pain Descriptors / Indicators: Guarding;Grimacing;Sore Pain  Intervention(s): Limited activity within patient's tolerance;Monitored during session    Home Living                      Prior Function            PT Goals (current goals can now be found in the care plan section) Acute Rehab PT Goals Patient Stated Goal: decrease pain PT Goal Formulation: With patient Time For Goal Achievement: 05/14/20 Potential to Achieve Goals: Good Progress towards PT goals: Progressing toward goals    Frequency    Min 3X/week      PT Plan Current plan remains appropriate    Co-evaluation              AM-PAC PT "6 Clicks" Mobility   Outcome Measure  Help needed turning from your back to your side while in a flat bed without using bedrails?: None Help needed moving from lying on your back to sitting on the side of a flat bed without using bedrails?: A Little Help needed moving to and from a bed to a chair (including a wheelchair)?: A Little Help needed standing up from a chair using your arms (e.g., wheelchair or bedside chair)?: A Little Help needed to walk in hospital room?: A Little Help needed climbing 3-5 steps with a railing? : A Lot 6 Click Score: 18    End of Session Equipment Utilized During Treatment: Gait belt Activity Tolerance: Patient tolerated treatment well Patient left: with call bell/phone within reach;in bed;with bed alarm set Nurse Communication: Mobility status PT Visit Diagnosis: Difficulty in walking, not elsewhere classified (R26.2);Pain Pain - Right/Left: Left Pain - part of body: Leg     Time: 1400-1419 PT Time Calculation (min) (ACUTE ONLY): 19 min  Charges:  $Gait Training: 8-22 mins                     Abran Richard, PT Acute Rehab Services Pager [phone] [facility] [place] 05/06/2020, 2:30 PM

## 2020-05-06 NOTE — Progress Notes (Signed)
Nutrition Follow-up  DOCUMENTATION CODES:   Severe malnutrition in context of acute illness/injury  INTERVENTION:    Continue Ensure Enlive po TID, each supplement provides 350 kcal and 20 grams of protein.  Continue MVI with minerals daily.  Continue Prosource Plus 30 ml PO BID, each packet provides 100 kcal and 15 gm protein.  NUTRITION DIAGNOSIS:   Severe Malnutrition related to acute illness as evidenced by moderate fat depletion,moderate muscle depletion.  Ongoing   GOAL:   Patient will meet greater than or equal to 90% of their needs  Progressing  MONITOR:   PO intake,Supplement acceptance  REASON FOR ASSESSMENT:   Consult Assessment of nutrition requirement/status  ASSESSMENT:   65 yo male admitted with left foot pain, osteomyelitis. PMH includes bipolar disorder, homelessness, recent calcaneous fracture, MSSA bacteremia, cannabis abuse, seizure disorder, psychosis, chronic hepatitis C.  Patient reports good appetite and good intake of most meals.  He likes the Ensure and Prosource Plus supplements. Per RN, he drinks a lot of Ensure every day, more than what is ordered for him. Glucerna Shake at bedside; RN says there is no Ensure available on the unit. Secretary is ordering Ensure.   Meal intakes: 75-100%   Labs reviewed.  Medications reviewed and include vitamin C, vitamin B-84, Colace, folic acid, MVI with minerals, Protonix, Senokot, thiamine.  No new weight available since amputation.  Diet Order:   Diet Order            Diet regular Room service appropriate? Yes with Assist; Fluid consistency: Thin  Diet effective now                 EDUCATION NEEDS:   No education needs have been identified at this time  Skin:  Skin Assessment: Skin Integrity Issues: Skin Integrity Issues:: Incisions,Unstageable Unstageable: Ulcer to the hip from a fall Incisions: L BKA site  Last BM:  4/25 type 4  Height:   Ht Readings from Last 1 Encounters:   04/15/20 6' (1.829 m)    Weight:   Wt Readings from Last 1 Encounters:  04/15/20 71 kg  prior to amputation  Ideal Body Weight:  76.1 kg (adjusted by 5.9% for BKA)   Estimated Nutritional Needs:   Kcal:  2300-2500  Protein:  130-140 gm  Fluid:  >/= 2.3 L    Lucas Mallow, RD, LDN, CNSC Please refer to Amion for contact information.

## 2020-05-06 NOTE — Progress Notes (Signed)
[facility] PROGRESS NOTE  Andrew Chaney NWG:[id number] DOB: 03/23/1955 DOA: 04/15/2020 PCP: Patient, No Pcp Per (Inactive)  Status:  Remains inpatient appropriate because:Ongoing active pain requiring inpatient pain management, Unsafe d/c plan and IV treatments appropriate due to intensity of illness or inability to take PO   Dispo: The patient is from: Homeless              Anticipated d/c is to: SNF vs homeless shelter              Patient currently is not medically stable to d/c.   Difficult to place patient Yes              Barriers to DC: No insurance-inconsistent participation w/ therapy   Level of care: Progressive  Code Status: FULL Family Communication: Patient DVT prophylaxis: Lovenox Vaccination status: Unknown  Foley catheter: No  HPI: 65 y.o. male with history of bipolar disorder, MSSA bacteremia, chronic hepatitis C-who was diagnosed with left calcaneal fracture several months ago-homelessness-who was brought to the ED by [employer]-he was found to have osteomyelitis of his left foot-with septic shock-he was managed in the ICU by PCCM-upon stability-transfer to the [facility].  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  See below for further details.  Significant events: 4/5>> admit for left foot osteomyelitis 4/6>> septic shock-staph aureus bacteremia-PCCM consulted 4/6>> left BKA 4/11>> TEE  Antimicrobial therapy: Vancomycin: 4/5>> 4/19 Flagyl: 4/5>> 4/6 Cefepime: 4/5>> 4/6 Daptomycin 4/19 >> 4/22 Oral doxycycline 4/22  Microbiology data: 4/5>> blood culture: MRSA 4/7>> blood culture: No growth  Subjective: Awaken from sleep.  Multiple staff in room.  Patient continues to report some discomfort involving amputation stump.  States he will be getting up and working with nursing staff and PT later today.  Objective: Vitals:   05/06/20 0200 05/06/20 0532  BP: (!) 98/59 103/70  Pulse: 76 70  Resp: 18 20  Temp: 98.2 F (36.8 C) 97.9 F (36.6 C)   SpO2: 98% 96%    Intake/Output Summary (Last 24 hours) at 05/06/2020 0804 Last data filed at 05/06/2020 0500 Gross per 24 hour  Intake --  Output 1431 ml  Net -1431 ml   Filed Weights   04/15/20 1800  Weight: 71 kg    Exam:  Constitutional: Calm, mildly uncomfortable secondary to ongoing stump pain, otherwise no acute distress Respiratory: Posterior lung sounds are clear to auscultation.  He remained stable on room air Cardiovascular: S1-S2, regular pulse, no peripheral edema. Abdomen: LBM 4/25, Musculoskeletal: LEFT BKA -site unremarkable with some staples in place Neurologic: CN 2-12 grossly intact. Sensation intact, DTR normal. Strength 5/5 x all 4 extremities.  Psychiatric: Alert and oriented x3.   Assessment/Plan: Acute problems: Septic shock due to MRSA bacteremia/left foot osteomyelitis with abscess involving his left foot:  Sepsis physiology has resolved-status post left BKA -Pharmacist spoke with ID who recommends changing from IV vancomycin to daptomycin before transitioning to oral doxycycline -TEE negative for vegetations.   -Due to lack of IV access IV daptomycin was transitioned over to oral doxycycline last 22 and will continue for total of 30 days per recommendation of ID -CRP less than 0.5 on 4/23 and ESR was 5  Right hip pressure ulcer/L BKA surgical wound:  Due to slipping on concrete--continue wound care. Pressure Injury 04/15/20 Hip Right Unstageable - Full thickness tissue loss in which the base of the injury is covered by slough (yellow, tan, gray, green or brown) and/or eschar (tan, brown or black) in the wound bed.  4x4 R hip  (Active)  Date First Assessed/Time First Assessed: 04/15/20 1900   Location: Hip  Location Orientation: Right  Staging: Unstageable - Full thickness tissue loss in which the base of the injury is covered by slough (yellow, tan, gray, green or brown) and/or esch...    Assessments 04/15/2020  8:29 PM 05/05/2020  8:00 PM  Dressing Type  -- Foam - Lift dressing to assess site every shift  Dressing -- Intact;Dry;Clean  Site / Wound Assessment -- Pink;Red;Yellow  Peri-wound Assessment Erythema (blanchable) --  Drainage Amount None --  Treatment Cleansed;Off loading --     No Linked orders to display     Incision (Closed) 04/16/20 Leg Left (Active)  Date First Assessed/Time First Assessed: 04/16/20 1708   Location: Leg  Location Orientation: Left    Assessments 04/16/2020  5:59 PM 05/05/2020  8:00 PM  Dressing Type Negative pressure wound therapy None  Dressing Clean;Dry;Intact --  Site / Wound Assessment Dressing in place / Unable to assess Clean;Dry  Margins -- Attached edges (approximated)  Closure -- Staples  Drainage Amount None None     No Linked orders to display  Due to reports of inadequate pain control will increase Lyrica to 150 mg 3 times daily for postoperative neuropathic pain and add lidocaine patch Continue low-dose oxycodone as needed-avoid increasing narcotic dosage or transitioning to an acting narcotic  Bipolar disorder:  Previously noncompliant with medications -evaluated by psychiatry-started on lithium  Hypokalemia K 3.0-repleted 4/23 up to 3.9 Follow labs  Seizure disorder:  Continue Keppra  Nutrition Problem: Nutrition Problem: Severe Malnutrition Etiology: acute illness Signs/Symptoms: moderate fat depletion,moderate muscle depletion Interventions: Refer to RD note for recommendations Estimated body mass index is 21.23 kg/m as calculated from the following:   Height as of this encounter: 6' (1.829 m).   Weight as of this encounter: 71 kg.   Physical debility Continue PT and OT. Recommendation is for SNF Patient homeless prior to admission and not safe to return to street Patient had been inconsistently participating but on 4/20 after my attending physician also explained patient's discharge options if he did not participate in therapy he agreed to participate.  According to PT  notes he progresses well with mobility and will likely be able to reach modified independent level if he continues to participate as recommended with PT.  PT evaluation 4/25: Pt agreeable to PT session on second attempt today. Pt ambulatory in room with use of RW x20 ft, limited by reported severe L residual limb pain. Pt tolerated BKA exercises well, although fatigues quickly. PT educated pt on phantom limb pain, given pt's complaints of phantom pain. PT to continue to progress mobility as able.   OT evaluation 4/24 Prior to hospitalization, pt was homeless, sleeping on the street and denying any assist from family or friends. Pt independently ambulated, utilized public restrooms for self-care, and occasionally bought food from a store or restaurant if given enough money. Today, pt received side lying in bed, pt agreeable to OT eval with encouragement. Pt presents with mild anxiety and stress. OT comforted pt and offered education on resources to assist him mentally/physically. Pt performed bed mobility with mod I, required supervision for sitting EOB, min guard for sit>stand from EOB using RW, min guard for stand pivot transfer to chair using RW, min assist-min guard for LB self-care, and setup-mod I for UB self-care. Educated pt on safe positioning, importance of wearing LLE residual limb protector, process of getting a prosthetic one day, safety  awareness, RW safety management, adaptive ADL strategies, cognitive re-orientation, and d/c plans. Pt would benefit from continued skilled acute care OT services to maximize independence with ADLs/ADL mobility to ensure safety.     Other problems: Vitamin B12 deficiency:  Continue weekly IM supplementation.  Hyponatremia:  Resolved  Normocytic anemia:  Due to acute illness-chronic osteomyelitis-follow CBC periodically  Chronic hepatitis C:  Per ID-plans are for outpatient treatment.   Data Reviewed: Basic Metabolic Panel: Recent Labs  Lab  05/03/20 0808  NA 139  K 3.9  CL 105  CO2 27  GLUCOSE 107*  BUN 22  CREATININE 0.66  CALCIUM 9.2  MG 1.9   Liver Function Tests: Recent Labs  Lab 05/03/20 0808  AST 19  ALT 20  ALKPHOS 49  BILITOT 0.7  PROT 5.8*  ALBUMIN 2.9*   No results for input(s): LIPASE, AMYLASE in the last 168 hours. No results for input(s): AMMONIA in the last 168 hours. CBC: Recent Labs  Lab 05/03/20 0808  WBC 4.8  HGB 11.7*  HCT 36.5*  MCV 99.2  PLT 357   Cardiac Enzymes: Recent Labs  Lab 05/01/20 1336  CKTOTAL 19*   BNP (last 3 results) Recent Labs    09/15/19 1023  BNP 57.8    ProBNP (last 3 results) No results for input(s): PROBNP in the last 8760 hours.  CBG: No results for input(s): GLUCAP in the last 168 hours.  No results found for this or any previous visit (from the past 240 hour(s)).   Studies: No results found.  Scheduled Meds: . (feeding supplement) PROSource Plus  30 mL Oral BID BM  . vitamin C  1,000 mg Oral Daily  . Chlorhexidine Gluconate Cloth  6 each Topical Daily  . cyanocobalamin  1,000 mcg Intramuscular Weekly  . docusate sodium  100 mg Oral Daily  . doxycycline  100 mg Oral Q12H  . enoxaparin (LOVENOX) injection  40 mg Subcutaneous Q24H  . feeding supplement  237 mL Oral TID BM  . folic acid  1 mg Oral Daily  . hydrOXYzine  50 mg Oral QHS  . levETIRAcetam  500 mg Oral BID  . lithium carbonate  300 mg Oral BID WC  . multivitamin with minerals  1 tablet Oral Daily  . pantoprazole  40 mg Oral Daily  . pregabalin  100 mg Oral TID  . senna  1 tablet Oral BID  . thiamine  250 mg Oral Daily   Continuous Infusions: . sodium chloride Stopped (04/20/20 0858)  . magnesium sulfate bolus IVPB      Active Problems:   Hyponatremia   Chronic hepatitis C (HCC)   Bipolar disorder, most recent episode manic (HCC)   MRSA bacteremia   Subacute osteomyelitis, left ankle and foot (HCC)   Sepsis due to skin infection (HCC)   Cutaneous abscess of left  foot   Left foot pain   IVDU (intravenous drug user)   Protein-calorie malnutrition, severe   S/P BKA (below knee amputation) unilateral, left ([place])   Consultants: PCCM, orthopedics, infectious disease, psychiatry  Procedures: 4/6>> left BKA (Dr. Sharol Given) 4/11>> TEE: No vegetations, EF 60-65%   Antibiotics: Anti-infectives (From admission, onward)   Start     Dose/Rate Route Frequency Ordered Stop   05/02/20 1000  doxycycline (VIBRA-TABS) tablet 100 mg  Status:  Discontinued        100 mg Oral Every 12 hours 05/01/20 1204 05/01/20 1301   05/02/20 1000  doxycycline (VIBRA-TABS) tablet 100 mg  100 mg Oral Every 12 hours 05/02/20 0901 06/01/20 0959   05/01/20 2000  DAPTOmycin (CUBICIN) 500 mg in sodium chloride 0.9 % IVPB  Status:  Discontinued       Note to Pharmacy: It appears last dose is due today.  I have already ordered the doxycycline to begin tomorrow for a total of 30 days as recommended by ID.   500 mg 120 mL/hr over 30 Minutes Intravenous Daily 05/01/20 1205 05/02/20 0901   04/29/20 1400  DAPTOmycin (CUBICIN) 500 mg in sodium chloride 0.9 % IVPB  Status:  Discontinued        500 mg 120 mL/hr over 30 Minutes Intravenous Daily 04/29/20 1252 05/01/20 1205   04/28/20 1800  vancomycin (VANCOREADY) IVPB 1000 mg/200 mL  Status:  Discontinued        1,000 mg 200 mL/hr over 60 Minutes Intravenous Every 8 hours 04/28/20 1541 04/29/20 1252   04/23/20 0130  linezolid (ZYVOX) tablet 600 mg  Status:  Discontinued        600 mg Oral Every 12 hours 04/23/20 0039 04/23/20 0854   04/16/20 0630  vancomycin (VANCOREADY) IVPB 1000 mg/200 mL  Status:  Discontinued       "Followed by" Linked Group Details   1,000 mg 200 mL/hr over 60 Minutes Intravenous Every 12 hours 04/15/20 1824 04/28/20 1400   04/16/20 0200  metroNIDAZOLE (FLAGYL) IVPB 500 mg  Status:  Discontinued        500 mg 100 mL/hr over 60 Minutes Intravenous Every 8 hours 04/15/20 1858 04/17/20 0945   04/15/20 1830  ceFEPIme  (MAXIPIME) 2 g in sodium chloride 0.9 % 100 mL IVPB  Status:  Discontinued        2 g 200 mL/hr over 30 Minutes Intravenous Every 8 hours 04/15/20 1824 04/17/20 0849   04/15/20 1830  vancomycin (VANCOREADY) IVPB 1500 mg/300 mL       "Followed by" Linked Group Details   1,500 mg 150 mL/hr over 120 Minutes Intravenous  Once 04/15/20 1824 04/16/20 0003   04/15/20 1745  metroNIDAZOLE (FLAGYL) IVPB 500 mg        500 mg 100 mL/hr over 60 Minutes Intravenous  Once 04/15/20 1737 04/15/20 1914       Time spent: 35 minutes    Erin Hearing ANP  [facility] 7 am - 330 pm/M-F for direct patient care and secure chat Please refer to Amion for contact info 21  days

## 2020-05-07 MED ORDER — IBUPROFEN 400 MG PO TABS
400.0000 mg | ORAL_TABLET | Freq: Four times a day (QID) | ORAL | Status: DC | PRN
  Administered 2020-05-09: 400 mg via ORAL
  Filled 2020-05-07: qty 1

## 2020-05-07 MED ORDER — OXYCODONE HCL 5 MG PO TABS
10.0000 mg | ORAL_TABLET | Freq: Four times a day (QID) | ORAL | Status: DC | PRN
  Administered 2020-05-07 (×3): 10 mg via ORAL
  Filled 2020-05-07 (×3): qty 2

## 2020-05-07 NOTE — Progress Notes (Signed)
[facility] PROGRESS NOTE  Esker Dever XQK:[id number] DOB: 15-Aug-1955 DOA: 04/15/2020 PCP: Patient, No Pcp Per (Inactive)  Status: Remains inpatient appropriate because:Ongoing active pain requiring inpatient pain management, Unsafe d/c plan and IV treatments appropriate due to intensity of illness or inability to take PO   Dispo: The patient is from: Homeless              Anticipated d/c is to: SNF vs homeless shelter              Patient currently is not medically stable to d/c.   Difficult to place patient Yes              Barriers to DC: No insurance-inconsistent participation w/ therapy   Level of care: Progressive  Code Status: FULL Family Communication: Patient DVT prophylaxis: Lovenox Vaccination status: Unknown  Foley catheter: No  HPI: 65 y.o. male with history of bipolar disorder, MSSA bacteremia, chronic hepatitis C-who was diagnosed with left calcaneal fracture several months ago-homelessness-who was brought to the ED by [employer]-he was found to have osteomyelitis of his left foot-with septic shock-he was managed in the ICU by PCCM-upon stability-transfer to the [facility].  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  See below for further details.  Significant events: 4/5>> admit for left foot osteomyelitis 4/6>> septic shock-staph aureus bacteremia-PCCM consulted 4/6>> left BKA 4/11>> TEE  Antimicrobial therapy: Vancomycin: 4/5>> 4/19 Flagyl: 4/5>> 4/6 Cefepime: 4/5>> 4/6 Daptomycin 4/19 >> 4/22 Oral doxycycline 4/22  Microbiology data: 4/5>> blood culture: MRSA 4/7>> blood culture: No growth  Subjective: Standing at sink performing a.m. care with OT personnel assisting.  Both of [place] were encouraging patient to stay up in chair.  She had initially wanted the patient to stay up 2 hours but he reported due to pain he would be unable to do this.  Eventually patient agreed to staying up at least 30 to 45 minutes.  Patient had headache last night that  responded to ibuprofen.  Informed him I would have ibuprofen available as needed for headaches and to help with his stomach pain.  Objective: Vitals:   05/07/20 0436 05/07/20 0807  BP: 103/70 107/71  Pulse: 70 74  Resp: 18 18  Temp: 98.3 F (36.8 C) 97.8 F (36.6 C)  SpO2: 97% 98%    Intake/Output Summary (Last 24 hours) at 05/07/2020 9597 Last data filed at 05/07/2020 0400 Gross per 24 hour  Intake 960 ml  Output 1825 ml  Net -865 ml   Filed Weights   04/15/20 1800  Weight: 71 kg    Exam:  Constitutional: Calm, no acute distress Respiratory: Lungs are clear, room air Cardiovascular: S1-S2, no tachycardia, no peripheral edema Abdomen: LBM 4/25, soft and nontender with hypoactive bowel sounds. Musculoskeletal: LEFT BKA -site unremarkable with some staples in place Neurologic: CN 2-12 grossly intact. Sensation intact, DTR normal. Strength 5/5 x all 4 extremities.  Psychiatric: X3.  Pleasant affect   Assessment/Plan: Acute problems: Septic shock due to MRSA bacteremia/left foot osteomyelitis with abscess involving his left foot:  Sepsis physiology has resolved-status post left BKA -Pharmacist spoke with ID who recommends changing from IV vancomycin to daptomycin before transitioning to oral doxycycline -TEE negative for vegetations.   -Due to lack of IV access IV daptomycin was transitioned over to oral doxycycline last 22 and will continue for total of 30 days per recommendation of ID -CRP less than 0.5 on 4/23 and ESR was 5  Right hip pressure ulcer/L BKA surgical wound:  Due  to slipping on concrete--continue wound care. Pressure Injury 04/15/20 Hip Right Unstageable - Full thickness tissue loss in which the base of the injury is covered by slough (yellow, tan, gray, green or brown) and/or eschar (tan, brown or black) in the wound bed. 4x4 R hip  (Active)  Date First Assessed/Time First Assessed: 04/15/20 1900   Location: Hip  Location Orientation: Right  Staging:  Unstageable - Full thickness tissue loss in which the base of the injury is covered by slough (yellow, tan, gray, green or brown) and/or esch...    Assessments 04/15/2020  8:29 PM 05/06/2020  7:45 PM  Dressing -- Clean;Dry;Intact  Peri-wound Assessment Erythema (blanchable) --  Drainage Amount None --  Treatment Cleansed;Off loading --     No Linked orders to display     Incision (Closed) 04/16/20 Leg Left (Active)  Date First Assessed/Time First Assessed: 04/16/20 1708   Location: Leg  Location Orientation: Left    Assessments 04/16/2020  5:59 PM 05/06/2020  7:45 PM  Dressing Type Negative pressure wound therapy None  Dressing Clean;Dry;Intact Other (Comment)  Site / Wound Assessment Dressing in place / Unable to assess Dry;Clean  Drainage Amount None --     No Linked orders to display  Due to reports of inadequate pain control Lyrica increased to 150 mg 3 times daily for postoperative neuropathic pain 4/26 -patient reported no change in discomfort after lidocaine patch added as well We will go ahead and increase oxycodone to 5 mg every 6 hours as needed.  Would like to get pain controlled and then begin to wean narcotics down.  Bipolar disorder:  Previously noncompliant with medications -evaluated by psychiatry-started on lithium  Recurrent headache Began as needed ibuprofen  Hypokalemia K 3.0-repleted 4/23 up to 3.9 Follow labs  Seizure disorder:  Continue Keppra  Nutrition Problem: Nutrition Problem: Severe Malnutrition Etiology: acute illness Signs/Symptoms: moderate fat depletion,moderate muscle depletion Interventions: Refer to RD note for recommendations Estimated body mass index is 21.23 kg/m as calculated from the following:   Height as of this encounter: 6' (1.829 m).   Weight as of this encounter: 71 kg.   Physical debility Continue PT and OT. Recommendation is for SNF Patient homeless prior to admission and not safe to return to street Patient had been  inconsistently participating but on 4/20 after my attending physician also explained patient's discharge options if he did not participate in therapy he agreed to participate.  According to PT notes he progresses well with mobility and will likely be able to reach modified independent level if he continues to participate as recommended with PT.  PT evaluation 4/25: Pt agreeable to PT session on second attempt today. Pt ambulatory in room with use of RW x20 ft, limited by reported severe L residual limb pain. Pt tolerated BKA exercises well, although fatigues quickly. PT educated pt on phantom limb pain, given pt's complaints of phantom pain. PT to continue to progress mobility as able.   OT evaluation 4/24 Prior to hospitalization, pt was homeless, sleeping on the street and denying any assist from family or friends. Pt independently ambulated, utilized public restrooms for self-care, and occasionally bought food from a store or restaurant if given enough money. Today, pt received side lying in bed, pt agreeable to OT eval with encouragement. Pt presents with mild anxiety and stress. OT comforted pt and offered education on resources to assist him mentally/physically. Pt performed bed mobility with mod I, required supervision for sitting EOB, min guard for sit>stand from  EOB using RW, min guard for stand pivot transfer to chair using RW, min assist-min guard for LB self-care, and setup-mod I for UB self-care. Educated pt on safe positioning, importance of wearing LLE residual limb protector, process of getting a prosthetic one day, safety awareness, RW safety management, adaptive ADL strategies, cognitive re-orientation, and d/c plans. Pt would benefit from continued skilled acute care OT services to maximize independence with ADLs/ADL mobility to ensure safety.     Other problems: Vitamin B12 deficiency:  Continue weekly IM supplementation.  Hyponatremia:  Resolved  Normocytic anemia:  Due to  acute illness-chronic osteomyelitis-follow CBC periodically  Chronic hepatitis C:  Per ID-plans are for outpatient treatment.   Data Reviewed: Basic Metabolic Panel: Recent Labs  Lab 05/03/20 0808  NA 139  K 3.9  CL 105  CO2 27  GLUCOSE 107*  BUN 22  CREATININE 0.66  CALCIUM 9.2  MG 1.9   Liver Function Tests: Recent Labs  Lab 05/03/20 0808  AST 19  ALT 20  ALKPHOS 49  BILITOT 0.7  PROT 5.8*  ALBUMIN 2.9*   No results for input(s): LIPASE, AMYLASE in the last 168 hours. No results for input(s): AMMONIA in the last 168 hours. CBC: Recent Labs  Lab 05/03/20 0808  WBC 4.8  HGB 11.7*  HCT 36.5*  MCV 99.2  PLT 357   Cardiac Enzymes: Recent Labs  Lab 05/01/20 1336  CKTOTAL 19*   BNP (last 3 results) Recent Labs    09/15/19 1023  BNP 57.8    ProBNP (last 3 results) No results for input(s): PROBNP in the last 8760 hours.  CBG: No results for input(s): GLUCAP in the last 168 hours.  No results found for this or any previous visit (from the past 240 hour(s)).   Studies: No results found.  Scheduled Meds: . (feeding supplement) PROSource Plus  30 mL Oral BID BM  . vitamin C  1,000 mg Oral Daily  . Chlorhexidine Gluconate Cloth  6 each Topical Daily  . cyanocobalamin  1,000 mcg Intramuscular Weekly  . docusate sodium  100 mg Oral BID  . doxycycline  100 mg Oral Q12H  . enoxaparin (LOVENOX) injection  40 mg Subcutaneous Q24H  . feeding supplement  237 mL Oral TID BM  . folic acid  1 mg Oral Daily  . hydrOXYzine  50 mg Oral QHS  . levETIRAcetam  500 mg Oral BID  . lidocaine  1 patch Transdermal Q24H  . lithium carbonate  300 mg Oral BID WC  . multivitamin with minerals  1 tablet Oral Daily  . pantoprazole  40 mg Oral Daily  . pregabalin  150 mg Oral TID  . senna  1 tablet Oral BID  . thiamine  250 mg Oral Daily   Continuous Infusions: . sodium chloride Stopped (04/20/20 0858)  . magnesium sulfate bolus IVPB      Active Problems:    Hyponatremia   Chronic hepatitis C (HCC)   Bipolar disorder, most recent episode manic (HCC)   MRSA bacteremia   Subacute osteomyelitis, left ankle and foot (HCC)   Sepsis due to skin infection (HCC)   Cutaneous abscess of left foot   Left foot pain   IVDU (intravenous drug user)   Protein-calorie malnutrition, severe   S/P BKA (below knee amputation) unilateral, left ([place])   Consultants: PCCM, orthopedics, infectious disease, psychiatry  Procedures: 4/6>> left BKA (Dr. Sharol Given) 4/11>> TEE: No vegetations, EF 60-65%   Antibiotics: Anti-infectives (From admission, onward)   Start  Dose/Rate Route Frequency Ordered Stop   05/02/20 1000  doxycycline (VIBRA-TABS) tablet 100 mg  Status:  Discontinued        100 mg Oral Every 12 hours 05/01/20 1204 05/01/20 1301   05/02/20 1000  doxycycline (VIBRA-TABS) tablet 100 mg        100 mg Oral Every 12 hours 05/02/20 0901 06/01/20 0959   05/01/20 2000  DAPTOmycin (CUBICIN) 500 mg in sodium chloride 0.9 % IVPB  Status:  Discontinued       Note to Pharmacy: It appears last dose is due today.  I have already ordered the doxycycline to begin tomorrow for a total of 30 days as recommended by ID.   500 mg 120 mL/hr over 30 Minutes Intravenous Daily 05/01/20 1205 05/02/20 0901   04/29/20 1400  DAPTOmycin (CUBICIN) 500 mg in sodium chloride 0.9 % IVPB  Status:  Discontinued        500 mg 120 mL/hr over 30 Minutes Intravenous Daily 04/29/20 1252 05/01/20 1205   04/28/20 1800  vancomycin (VANCOREADY) IVPB 1000 mg/200 mL  Status:  Discontinued        1,000 mg 200 mL/hr over 60 Minutes Intravenous Every 8 hours 04/28/20 1541 04/29/20 1252   04/23/20 0130  linezolid (ZYVOX) tablet 600 mg  Status:  Discontinued        600 mg Oral Every 12 hours 04/23/20 0039 04/23/20 0854   04/16/20 0630  vancomycin (VANCOREADY) IVPB 1000 mg/200 mL  Status:  Discontinued       "Followed by" Linked Group Details   1,000 mg 200 mL/hr over 60 Minutes Intravenous Every  12 hours 04/15/20 1824 04/28/20 1400   04/16/20 0200  metroNIDAZOLE (FLAGYL) IVPB 500 mg  Status:  Discontinued        500 mg 100 mL/hr over 60 Minutes Intravenous Every 8 hours 04/15/20 1858 04/17/20 0945   04/15/20 1830  ceFEPIme (MAXIPIME) 2 g in sodium chloride 0.9 % 100 mL IVPB  Status:  Discontinued        2 g 200 mL/hr over 30 Minutes Intravenous Every 8 hours 04/15/20 1824 04/17/20 0849   04/15/20 1830  vancomycin (VANCOREADY) IVPB 1500 mg/300 mL       "Followed by" Linked Group Details   1,500 mg 150 mL/hr over 120 Minutes Intravenous  Once 04/15/20 1824 04/16/20 0003   04/15/20 1745  metroNIDAZOLE (FLAGYL) IVPB 500 mg        500 mg 100 mL/hr over 60 Minutes Intravenous  Once 04/15/20 1737 04/15/20 1914       Time spent: 35 minutes    Erin Hearing ANP  [facility] 7 am - 330 pm/M-F for direct patient care and secure chat Please refer to Amion for contact info 22  days

## 2020-05-07 NOTE — Progress Notes (Signed)
Occupational Therapy Treatment Patient Details Name: Andrew Chaney MRN: [id number] DOB: Dec 06, 1955 Today's Date: 05/07/2020    History of present illness Pt is 65 y.o. male who presented on 04/15/20 with left foot pain (recent calcaneus fx) with purulent drainage and rt lateral hip pressure ulcer. 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  Pt remains hospitalized due to difficult placement. Pt with medical history significant of bipolar disorder homelessness recent calcaneus fracture, history of MSSA bacteremia, cannabis abuse, seizure disorder, psychosis, chronic hepatitis C   OT comments  Pt progressing well towards OT goals. Pt does require firm directions and increased encouragement to participate but able to demo mobility and standing ADLs well. Pt Supervision for mobility to/from sink with RW, as well as grooming tasks standing at sink. Pt able to demo independent compensatory strategies and problem solving to maintain balance and complete tasks without assist. Pt refused to have footrest elevated in chair so OT encouraged frequent knee extension exercises of residual limb. Pt agreeable to sit up in chair for only 30 min today. Plan to trial wheelchair mgmt/mobility during ADLs with next session. With consistent participation, anticipate pt to achieve Mod I level quickly.    Follow Up Recommendations  SNF    Equipment Recommendations  Wheelchair (measurements OT);Wheelchair cushion (measurements OT)    Recommendations for Other Services Other (comment) (psych consult)    Precautions / Restrictions Precautions Precautions: Fall Required Braces or Orthoses: Other Brace Other Brace: residual limb protector - pt refused to wear Restrictions Weight Bearing Restrictions: Yes LLE Weight Bearing: Non weight bearing       Mobility Bed Mobility Overal bed mobility: Independent Bed Mobility: Supine to Sit                Transfers Overall transfer level: Needs  assistance Equipment used: Rolling walker (2 wheeled) Transfers: Sit to/from Stand Sit to Stand: Supervision         General transfer comment: Supervision for sit to stand with RW, no LOB noted or difficulties in standing.    Balance Overall balance assessment: Needs assistance Sitting-balance support: Feet supported;No upper extremity supported Sitting balance-Leahy Scale: Good     Standing balance support: During functional activity;Bilateral upper extremity supported Standing balance-Leahy Scale: Fair Standing balance comment: due to BKA, pt reliant on at least one UE support in standing                           ADL either performed or assessed with clinical judgement   ADL Overall ADL's : Needs assistance/impaired     Grooming: Supervision/safety;Standing;Oral care;Brushing hair;Applying deodorant Grooming Details (indicate cue type and reason): Pt demo problem solving strategies for compensatory strategies to maintain balance while completing these tasks, no LOB noted                             Functional mobility during ADLs: Supervision/safety;Rolling walker General ADL Comments: No LOB and pt with actually decent standing balance and indepedent implementation of compensatory strategies     Vision   Vision Assessment?: No apparent visual deficits   Perception     Praxis      Cognition Arousal/Alertness: Awake/alert Behavior During Therapy: Flat affect Overall Cognitive Status: No family/caregiver present to determine baseline cognitive functioning Area of Impairment: Memory;Safety/judgement;Problem solving  Memory: Decreased short-term memory   Safety/Judgement: Decreased awareness of safety   Problem Solving: Requires verbal cues General Comments: pt is self-limiting, requires mod encouragement        Exercises     Shoulder Instructions       General Comments Pt refused to have footrest  elevated for residual limb support. Educated on knee extension exercises sitting in chair to facilitate blood flow and improved posturing in prep for prosthetic    Pertinent Vitals/ Pain       Pain Assessment: Faces Faces Pain Scale: Hurts a little bit Pain Location: LLE, more his head Pain Descriptors / Indicators: Guarding;Grimacing;Sore Pain Intervention(s): Monitored during session;Limited activity within patient's tolerance  Home Living                                          Prior Functioning/Environment              Frequency  Min 2X/week        Progress Toward Goals  OT Goals(current goals can now be found in the care plan section)  Progress towards OT goals: Progressing toward goals  Acute Rehab OT Goals Patient Stated Goal: decrease pain OT Goal Formulation: With patient Time For Goal Achievement: 05/18/20 Potential to Achieve Goals: Good ADL Goals Pt Will Perform Eating: Independently;sitting Pt Will Perform Grooming: standing Pt Will Perform Upper Body Bathing: with modified independence;sitting;standing Pt Will Perform Lower Body Bathing: with modified independence;sitting/lateral leans;sit to/from stand Pt Will Perform Lower Body Dressing: with modified independence;sitting/lateral leans;sit to/from stand Pt Will Transfer to Toilet: with modified independence;ambulating;grab bars Pt Will Perform Toileting - Clothing Manipulation and hygiene: with modified independence;sitting/lateral leans;sit to/from stand  Plan Discharge plan remains appropriate;Frequency remains appropriate    Co-evaluation                 AM-PAC OT "6 Clicks" Daily Activity     Outcome Measure   Help from another person eating meals?: None Help from another person taking care of personal grooming?: A Little Help from another person toileting, which includes using toliet, bedpan, or urinal?: A Little Help from another person bathing (including washing,  rinsing, drying)?: A Little Help from another person to put on and taking off regular upper body clothing?: None Help from another person to put on and taking off regular lower body clothing?: A Little 6 Click Score: 20    End of Session Equipment Utilized During Treatment: Rolling walker  OT Visit Diagnosis: Unsteadiness on feet (R26.81);Muscle weakness (generalized) (M62.81);Pain Pain - Right/Left: Left Pain - part of body: Leg   Activity Tolerance Patient tolerated treatment well   Patient Left in chair;with call bell/phone within reach;with chair alarm set   Nurse Communication Mobility status        Time: 4098-1191 OT Time Calculation (min): 18 min  Charges: OT General Charges $OT Visit: 1 Visit OT Treatments $Self Care/Home Management : 8-22 mins  Malachy Chamber, OTR/L Acute Rehab Services Office: [phone]   Layla Maw 05/07/2020, 9:24 AM

## 2020-05-07 NOTE — TOC Progression Note (Signed)
Transition of Care [facility]) - Progression Note    Patient Details  Name: Andrew Chaney MRN: [id number] Date of Birth: 04/26/1955  [facility]) CM/SW [place], RN Phone Number: 05/07/2020, 2:46 PM  Clinical Narrative:    MSW sent patient's clinicals to [facility] for review for possible bed offer if facility is able to accept LOG for admission.  CM and MSW will continue to follow the patient for admission to SNF once bed offer is available.    Expected Discharge Plan: Skilled Nursing Facility Barriers to Discharge: Inadequate or no insurance,No SNF bed,SNF Pending Medicaid,Active Substance Use - Placement,Homeless with medical needs  Expected Discharge Plan and Services Expected Discharge Plan: [place] In-house Referral: Clinical Social Work Discharge Planning Services: CM Consult Post Acute Care Choice: [place] arrangements for the past 2 months: Homeless                                       Social Determinants of Health ([place]) Interventions    Readmission Risk Interventions Readmission Risk Prevention Plan 04/25/2020  Transportation Screening Complete  Medication Review [employer]) Referral to Pharmacy  PCP or Specialist appointment within 3-5 days of discharge Complete  HRI or [place] Complete  SW Recovery Care/Counseling Consult Complete  Palliative Care Screening Not [place] Complete  Some recent data might be hidden

## 2020-05-08 NOTE — Progress Notes (Addendum)
[facility] PROGRESS NOTE  Brien Lowe OHY:[id number] DOB: 1955-07-05 DOA: 04/15/2020 PCP: Andrew Chaney, No Pcp Per (Inactive)  Status: Remains inpatient appropriate because:Ongoing active pain requiring inpatient pain management, Unsafe d/c plan and IV treatments appropriate due to intensity of illness or inability to take PO   Dispo: The Andrew Chaney is from: Homeless              Anticipated d/c is to: SNF vs homeless shelter              Andrew Chaney currently is not medically stable to d/c.   Difficult to place Andrew Chaney Yes              Barriers to DC: No insurance-inconsistent participation w/ therapy   Level of care: Progressive  Code Status: FULL Family Communication: Andrew Chaney DVT prophylaxis: Lovenox Vaccination status: Unknown  Foley catheter: No  HPI: 65 y.o. male with history of bipolar disorder, MSSA bacteremia, chronic hepatitis C-who was diagnosed with left calcaneal fracture several months ago-homelessness-who was brought to the ED by [employer]-he was found to have osteomyelitis of his left foot-with septic shock-he was managed in the ICU by PCCM-upon stability-transfer to the [facility].  Andrew Chaney underwent BKA on 4/6.  See below for further details.  Significant events: 4/5>> admit for left foot osteomyelitis 4/6>> septic shock-staph aureus bacteremia-PCCM consulted 4/6>> left BKA 4/11>> TEE  Antimicrobial therapy: Vancomycin: 4/5>> 4/19 Flagyl: 4/5>> 4/6 Cefepime: 4/5>> 4/6 Daptomycin 4/19 >> 4/22 Oral doxycycline 4/22  Microbiology data: 4/5>> blood culture: MRSA 4/7>> blood culture: No growth  Subjective:  Andrew Chaney in bed, appears comfortable, denies any headache, no fever, no chest pain or pressure, no shortness of breath , no abdominal pain. No new focal weakness.   Objective: Vitals:   05/07/20 2300 05/08/20 0805  BP: 90/72 (!) 95/55  Pulse: 70 73  Resp: 16 16  Temp: 98 F (36.7 C) 98.6 F (37 C)  SpO2: 100% 97%   No intake or output  data in the 24 hours ending 05/08/20 1132 Filed Weights   04/15/20 1800  Weight: 71 kg    Exam:  Awake Alert, No new F.N deficits, Normal affect [place].AT,PERRAL Supple Neck,No JVD, No cervical lymphadenopathy appriciated.  Symmetrical Chest wall movement, Good air movement bilaterally, CTAB RRR,No Gallops, Rubs or new Murmurs, No Parasternal Heave +ve B.Sounds, Abd Soft, No tenderness, No organomegaly appriciated, No rebound - guarding or rigidity. No Cyanosis, left BKA stump clean and almost healed with staples   Assessment/Plan:   Septic shock due to MRSA bacteremia/left foot osteomyelitis with abscess involving his left foot:  Sepsis pathophysiology has resolved after left BKA, he has finished his IV daptomycin and now on 30-day course of oral doxycycline with stop date of 06/03/2020.  He has been seen by ID, underwent TEE which was negative for vegetations   Bipolar disorder: Previously noncompliant with medications, evaluated by psychiatry-started on lithium.  Recurrent headache - Began as needed ibuprofen.  Seizure disorder: Continue Keppra.  Vitamin B12 deficiency: Continue weekly IM supplementation.  Normocytic anemia: Due to acute illness-chronic osteomyelitis-follow CBC periodically.  Chronic hepatitis C:  Per ID-plans are for outpatient treatment.  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  Physical debility - Continue PT and OT, await SNF, homeless.   Right hip pressure ulcer/L BKA surgical wound: Due to slipping on concrete--continue wound care.  This wound almost completely healed picture 05/08/20      Data Reviewed: Basic Metabolic Panel: Recent Labs  Lab 05/03/20 0808  NA  139  K 3.9  CL 105  CO2 27  GLUCOSE 107*  BUN 22  CREATININE 0.66  CALCIUM 9.2  MG 1.9   Liver Function Tests: Recent Labs  Lab 05/03/20 0808  AST 19  ALT 20  ALKPHOS 49  BILITOT 0.7  PROT 5.8*  ALBUMIN 2.9*   No results for input(s): LIPASE, AMYLASE in the  last 168 hours. No results for input(s): AMMONIA in the last 168 hours. CBC: Recent Labs  Lab 05/03/20 0808  WBC 4.8  HGB 11.7*  HCT 36.5*  MCV 99.2  PLT 357   Cardiac Enzymes: Recent Labs  Lab 05/01/20 1336  CKTOTAL 19*   BNP (last 3 results) Recent Labs    09/15/19 1023  BNP 57.8    ProBNP (last 3 results) No results for input(s): PROBNP in the last 8760 hours.  CBG: No results for input(s): GLUCAP in the last 168 hours.  No results found for this or any previous visit (from the past 240 hour(s)).   Studies: No results found.  Scheduled Meds: . (feeding supplement) PROSource Plus  30 mL Oral BID BM  . vitamin C  1,000 mg Oral Daily  . Chlorhexidine Gluconate Cloth  6 each Topical Daily  . cyanocobalamin  1,000 mcg Intramuscular Weekly  . docusate sodium  100 mg Oral BID  . doxycycline  100 mg Oral Q12H  . enoxaparin (LOVENOX) injection  40 mg Subcutaneous Q24H  . feeding supplement  237 mL Oral TID BM  . folic acid  1 mg Oral Daily  . hydrOXYzine  50 mg Oral QHS  . levETIRAcetam  500 mg Oral BID  . lidocaine  1 patch Transdermal Q24H  . lithium carbonate  300 mg Oral BID WC  . multivitamin with minerals  1 tablet Oral Daily  . pantoprazole  40 mg Oral Daily  . pregabalin  150 mg Oral TID  . senna  1 tablet Oral BID  . thiamine  250 mg Oral Daily   Continuous Infusions: . sodium chloride Stopped (04/20/20 0858)  . magnesium sulfate bolus IVPB      Active Problems:   Hyponatremia   Chronic hepatitis C (HCC)   Bipolar disorder, most recent episode manic (HCC)   MRSA bacteremia   Subacute osteomyelitis, left ankle and foot (HCC)   Sepsis due to skin infection (HCC)   Cutaneous abscess of left foot   Left foot pain   IVDU (intravenous drug user)   Protein-calorie malnutrition, severe   S/P BKA (below knee amputation) unilateral, left ([place])   Consultants: PCCM, orthopedics, infectious disease, psychiatry  Procedures: 4/6>> left BKA (Dr.  Sharol Given) 4/11>> TEE: No vegetations, EF 60-65%     Time spent: 35 minutes  Signature  Lala Lund M.D on 05/08/2020 at 11:32 AM   -  To page go to [email or web address]

## 2020-05-08 NOTE — Progress Notes (Signed)
Physical Therapy Treatment Patient Details Name: Andrew Chaney MRN: [id number] DOB: 1955-06-17 Today's Date: 05/08/2020    History of Present Illness Pt is 65 y.o. male who presented on 04/15/20 with left foot pain (recent calcaneus fx) with purulent drainage and rt lateral hip pressure ulcer. 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  Pt remains hospitalized due to difficult placement. Pt with medical history significant of bipolar disorder homelessness recent calcaneus fracture, history of MSSA bacteremia, cannabis abuse, seizure disorder, psychosis, chronic hepatitis C    PT Comments    Pt continues to make steady progress. Is increasing ambulation distance and improving balance. Pt pleasant and cooperative with brighter mood.  Pt does not tolerate shrinker to LLE.    Follow Up Recommendations  SNF     Equipment Recommendations  Wheelchair (measurements PT);Wheelchair cushion (measurements PT);Rolling walker with 5" wheels    Recommendations for Other Services       Precautions / Restrictions Precautions Precautions: Fall Other Brace: residual limb protector - pt will not wear    Mobility  Bed Mobility Overal bed mobility: Modified Independent Bed Mobility: Supine to Sit;Sit to Supine     Supine to sit: Modified independent (Device/Increase time) Sit to supine: Modified independent (Device/Increase time)        Transfers Overall transfer level: Needs assistance Equipment used: Rolling walker (2 wheeled) Transfers: Sit to/from Stand Sit to Stand: Supervision         General transfer comment: Assist for safety and verbal cues for hand placement  Ambulation/Gait Ambulation/Gait assistance: Min guard Gait Distance (Feet): 75 Feet Assistive device: Rolling walker (2 wheeled) Gait Pattern/deviations: Step-to pattern (hop to) Gait velocity: decr Gait velocity interpretation: <1.31 ft/sec, indicative of household ambulator General Gait Details:  Assist for safety and occasional cues for proximity of rolling walker   Stairs             Wheelchair Mobility    Modified Rankin (Stroke Patients Only)       Balance Overall balance assessment: Mild deficits observed, not formally tested;Needs assistance Sitting-balance support: Feet supported;No upper extremity supported Sitting balance-Leahy Scale: Good     Standing balance support: During functional activity;Bilateral upper extremity supported Standing balance-Leahy Scale: Poor Standing balance comment: UE support                            Cognition Arousal/Alertness: Awake/alert Behavior During Therapy: Flat affect Overall Cognitive Status: No family/caregiver present to determine baseline cognitive functioning Area of Impairment: Memory;Safety/judgement;Problem solving                     Memory: Decreased short-term memory   Safety/Judgement: Decreased awareness of safety   Problem Solving: Slow processing;Requires verbal cues        Exercises Amputee Exercises Hip Extension: AROM;Left;10 reps;Sidelying Hip Flexion/Marching: AROM;Left;10 reps;Seated Knee Flexion: AROM;Left;10 reps;Seated Knee Extension: Left;10 reps;AROM;Seated    General Comments        Pertinent Vitals/Pain Pain Assessment: Faces Faces Pain Scale: Hurts a little bit Pain Location: LLE Pain Descriptors / Indicators: Guarding;Grimacing;Sore    Home Living                      Prior Function            PT Goals (current goals can now be found in the care plan section) Acute Rehab PT Goals Patient Stated Goal: decrease pain Progress  towards PT goals: Progressing toward goals    Frequency    Min 3X/week      PT Plan Current plan remains appropriate    Co-evaluation              AM-PAC PT "6 Clicks" Mobility   Outcome Measure  Help needed turning from your back to your side while in a flat bed without using bedrails?:  None Help needed moving from lying on your back to sitting on the side of a flat bed without using bedrails?: A Little Help needed moving to and from a bed to a chair (including a wheelchair)?: A Little Help needed standing up from a chair using your arms (e.g., wheelchair or bedside chair)?: A Little Help needed to walk in hospital room?: A Little Help needed climbing 3-5 steps with a railing? : A Lot 6 Click Score: 18    End of Session Equipment Utilized During Treatment: Gait belt Activity Tolerance: Patient tolerated treatment well Patient left: with call bell/phone within reach;in bed;with bed alarm set Nurse Communication: Mobility status;Other (comment) (Pt washed up while sitting in chair while bed being changed) PT Visit Diagnosis: Difficulty in walking, not elsewhere classified (R26.2);Pain Pain - Right/Left: Left Pain - part of body: Leg     Time: 4403-4742 PT Time Calculation (min) (ACUTE ONLY): 30 min  Charges:  $Gait Training: 8-22 mins $Therapeutic Exercise: 8-22 mins                     Ferdinand Pager [phone] Office [place] 05/08/2020, 4:18 PM

## 2020-05-08 NOTE — Progress Notes (Addendum)
2:30pm: CSW received call from [facility] - patient can come into the facility tomorrow. CSW notified Dr. Candiss Norse of plan to discharge plan.  CSW completed LOG request and sent it to [facility] leadership for review.  1:30pm: CSW spoke with Tressa Busman at [facility] to ask him to reconsider this patient for admission as his wound is healed and his wound vac was removed on 4/11.   10:40am: CSW was notified that [facility] will not be able to offer this patient a bed due to his wound.  Madilyn Fireman, MSW, LCSW Transitions of Care  Clinical Social Worker II [phone]

## 2020-05-09 ENCOUNTER — Other Ambulatory Visit (HOSPITAL_COMMUNITY): Payer: Self-pay

## 2020-05-09 LAB — SARS CORONAVIRUS 2 (TAT 6-24 HRS): SARS Coronavirus 2: NEGATIVE

## 2020-05-09 MED ORDER — LITHIUM CARBONATE 300 MG PO CAPS: 300.0000 mg | ORAL_CAPSULE | Freq: Two times a day (BID) | ORAL | Status: AC

## 2020-05-09 MED ORDER — PANTOPRAZOLE SODIUM 40 MG PO TBEC: 40.0000 mg | DELAYED_RELEASE_TABLET | Freq: Every day | ORAL | Status: AC

## 2020-05-09 MED ORDER — ASCORBIC ACID 1000 MG PO TABS: 1000.0000 mg | ORAL_TABLET | Freq: Every day | ORAL | Status: AC

## 2020-05-09 MED ORDER — FOLIC ACID 1 MG PO TABS: 1.0000 mg | ORAL_TABLET | Freq: Every day | ORAL | 0 refills | Status: AC

## 2020-05-09 MED ORDER — PREGABALIN 150 MG PO CAPS: 150.0000 mg | ORAL_CAPSULE | Freq: Three times a day (TID) | ORAL | Status: AC

## 2020-05-09 MED ORDER — THIAMINE HCL 100 MG PO TABS: 250.0000 mg | ORAL_TABLET | Freq: Every day | ORAL | Status: AC

## 2020-05-09 MED ORDER — ACETAMINOPHEN 500 MG PO TABS: 500.0000 mg | ORAL_TABLET | Freq: Three times a day (TID) | ORAL | 0 refills | Status: AC | PRN

## 2020-05-09 MED ORDER — CYANOCOBALAMIN 1000 MCG/ML IJ SOLN: 1000.0000 ug | INTRAMUSCULAR | 0 refills | Status: AC

## 2020-05-09 MED ORDER — IBUPROFEN 400 MG PO TABS: 400.0000 mg | ORAL_TABLET | Freq: Four times a day (QID) | ORAL | 0 refills | Status: AC | PRN

## 2020-05-09 MED ORDER — LEVETIRACETAM 500 MG PO TABS
500.0000 mg | ORAL_TABLET | Freq: Two times a day (BID) | ORAL | 0 refills | Status: AC
  Filled 2020-05-09: qty 60, 30d supply, fill #0

## 2020-05-09 MED ORDER — DOXYCYCLINE HYCLATE 100 MG PO TABS: 100.0000 mg | ORAL_TABLET | Freq: Two times a day (BID) | ORAL | 0 refills | Status: AC

## 2020-05-09 NOTE — Progress Notes (Addendum)
Patient will go to room 313A at [name]. Patient will be transported via [facility] - transportation has been scheduled for pickup at 3pm. The number to call for report is [phone].  Madilyn Fireman, MSW, LCSW Transitions of Care  Clinical Social Worker II [phone]

## 2020-05-09 NOTE — Progress Notes (Signed)
OT Cancellation Note  Patient Details Name: Andrew Chaney MRN: [id number] DOB: 1955-01-16   Cancelled Treatment:    Reason Eval/Treat Not Completed: Fatigue/lethargy limiting ability to participate;Other (comment) Pt reports fatigue from not sleeping last night, will check back as time allows for OT session.  Corinne Ports K., COTA/L Acute Rehabilitation Services [phone]   [name] 05/09/2020, 12:06 PM

## 2020-05-09 NOTE — Progress Notes (Addendum)
1445: call placed to receiving facility, no answer placed on hold >23mins  1515: 2nd call placed no answer

## 2020-05-09 NOTE — Progress Notes (Signed)
Patient is over 3 weeks status post left below-knee amputation.  He has had a prolonged hospital day stay secondary to homelessness and difficult placement.  He is scheduled to be discharged today.  He is sitting up in bed very pleasant eating his breakfast   His left below-knee amputation stump has well apposed wound edges no drainage Staples are in place no ascending cellulitis or signs of infection   Discussed with the patient the need to wear his shrinker against the skin.  He should follow-up in our office in 1 week.  I will order staples to be removed today.

## 2020-05-09 NOTE — Progress Notes (Signed)
32 staples removed from left AKA incision. Site clean and dry.

## 2020-05-09 NOTE — Progress Notes (Signed)
[facility] arrived to transport patient to [facility]. Patient had pulled his PICC prior to shift change because he stated it was "uncomfortable." Vitals signs stable. Patient night time medications had been given (patient refused stool softener and laxative) and patient had received 400mg  ibuprofen for pain of 10/10 (patient's Oxycodone IR had been discontinued). Report called to Anderson Malta, the receiving nurse at [facility]. All of Jennifer's questions were answered at the time and she was given the number to call to the floor if she had any further questions.

## 2020-05-09 NOTE — Discharge Summary (Signed)
Andrew Chaney [id number] DOB: 1955-12-24 DOA: 04/15/2020  PCP: Patient, No Pcp Per (Inactive)  Admit date: 04/15/2020  Discharge date: 05/09/2020  Admitted From: Homeless  Disposition:  SNF   Recommendations for Outpatient Follow-up:   Follow up with PCP in 1-2 weeks  PCP Please obtain BMP/CBC, 2 view CXR in 1week,  (see Discharge instructions)   PCP Please follow up on the following pending results:    Home Health: None   Equipment/Devices: None  Consultations: Ortho Discharge Condition: Stable    CODE STATUS: Full    Diet Recommendation: Heart Healthy   Diet Order            Diet - low sodium heart healthy           Diet regular Room service appropriate? Yes with Assist; Fluid consistency: Thin  Diet effective now                  Chief Complaint  Patient presents with  . body pains     Brief history of present illness from the day of admission and additional interim summary    64 y.o.malewith history of bipolar disorder, MSSA bacteremia, chronic hepatitis C-who was diagnosed with left calcaneal fracture several months ago-homelessness-who was brought to the ED by [employer]-he was found to have osteomyelitis of his left foot-with septic shock-he was managed in the ICU by PCCM-upon stability-transfer to the [facility]. 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 See below for further details.  Significant events: 4/5>> admit for left foot osteomyelitis 4/6>> septic shock-staph aureus bacteremia-PCCM consulted 4/6>> left BKA 4/11>> TEE  Antimicrobial therapy: Vancomycin: 4/5>> 4/19 Flagyl: 4/5>> 4/6 Cefepime: 4/5>> 4/6 Daptomycin 4/19 >> 4/22 Oral doxycycline 4/22  Microbiology data: 4/5>> blood culture: MRSA 4/7>> blood culture: No growth                                                                  Hospital Course    Septic shock due to MRSA bacteremia/left foot osteomyelitis with abscess involving his left foot: Sepsis pathophysiology has resolved after left BKA, he has finished his IV daptomycin and now on 30-day course of oral doxycycline with stop date of 06/03/2020.  He has been seen by ID, underwent TEE which was negative for vegetations.   Bipolar disorder:Previously noncompliant with medications, evaluated by psychiatry-started on lithium.  Currently stable.  Recurrent headache - Began as needed ibuprofen.  Headaches much better.  Seizure disorder: Continue Keppra.  Vitamin B12 deficiency:Continue weekly IM supplementation once a month.  Normocytic anemia:Due to acute illness-chronic osteomyelitis-follow CBC periodically.  Chronic hepatitis C: Per ID-plans are for outpatient treatment.  Nutrition Problem: Severe PCM - on suppliments while he was here, can place on appropriate protein supplements at SNF.  Physical debility - Continue PT and OT, will  be discharged to SNF today after a negative COVID test.   Right hip pressure ulcer/L BKA surgical wound: Due to slipping on concrete--continue wound care as instructed in wound care instructions, almost completely healed wound.  BKA wound is stable have discussed with orthopedics they will remove staples prior to his discharge.  Follow-up with Dr. Sharol Given in 1 week post discharge.   Discharge diagnosis     Active Problems:   Hyponatremia   Chronic hepatitis C (HCC)   Bipolar disorder, most recent episode manic (HCC)   MRSA bacteremia   Subacute osteomyelitis, left ankle and foot (HCC)   Sepsis due to skin infection (HCC)   Cutaneous abscess of left foot   Left foot pain   IVDU (intravenous drug user)   Protein-calorie malnutrition, severe   S/P BKA (below knee amputation) unilateral, left (HCC)    Discharge instructions    Discharge Instructions    Diet - low sodium heart healthy    Complete by: As directed    Discharge instructions   Complete by: As directed    Follow with Primary MD in 7 days   Get CBC, CMP  -  checked next visit within 1 week by Primary MD or SNF MD   Activity: Must wear the leg shrinker provided on your amputated stump as instructed by orthopedics in the daytime, activity as tolerated with Full fall precautions use walker/cane & assistance as needed  Disposition SNF  Diet: Heart Healthy   Special Instructions: If you have smoked or chewed Tobacco  in the last 2 yrs please stop smoking, stop any regular Alcohol  and or any Recreational drug use.  On your next visit with your primary care physician please Get Medicines reviewed and adjusted.  Please request your Prim.MD to go over all Hospital Tests and Procedure/Radiological results at the follow up, please get all Hospital records sent to your Prim MD by signing hospital release before you go home.  If you experience worsening of your admission symptoms, develop shortness of breath, life threatening emergency, suicidal or homicidal thoughts you must seek medical attention immediately by calling 911 or calling your MD immediately  if symptoms less severe.  You Must read complete instructions/literature along with all the possible adverse reactions/side effects for all the Medicines you take and that have been prescribed to you. Take any new Medicines after you have completely understood and accpet all the possible adverse reactions/side effects.   Discharge wound care:   Complete by: As directed    R. Hip - Comments: Place a foam dressing over the right hip wound, change every 3 days and prn.   Increase activity slowly   Complete by: As directed       Discharge Medications   Allergies as of 05/09/2020   No Known Allergies     Medication List    STOP taking these medications   furosemide 20 MG tablet Commonly known as: LASIX   hydrOXYzine 25 MG tablet Commonly known as:  ATARAX/VISTARIL   phenytoin 200 MG ER capsule Commonly known as: DILANTIN     TAKE these medications   acetaminophen 500 MG tablet Commonly known as: TYLENOL Take 1 tablet (500 mg total) by mouth every 8 (eight) hours as needed for moderate pain.   ascorbic acid 1000 MG tablet Commonly known as: VITAMIN C Take 1 tablet (1,000 mg total) by mouth daily. Start taking on: May 10, 2020   cyanocobalamin 1000 MCG/ML injection Commonly known as: (VITAMIN B-12) Inject 1  mL (1,000 mcg total) into the skin every 30 (thirty) days.   doxycycline 100 MG tablet Commonly known as: VIBRA-TABS Take 1 tablet (100 mg total) by mouth every 12 (twelve) hours for 23 days.   folic acid 1 MG tablet Commonly known as: FOLVITE Take 1 tablet (1 mg total) by mouth daily.   ibuprofen 400 MG tablet Commonly known as: ADVIL Take 1 tablet (400 mg total) by mouth every 6 (six) hours as needed for headache or mild pain.   levETIRAcetam 500 MG tablet Commonly known as: KEPPRA Take 1 tablet (500 mg total) by mouth 2 (two) times daily.   lithium carbonate 300 MG capsule Take 1 capsule (300 mg total) by mouth 2 (two) times daily with a meal.   pantoprazole 40 MG tablet Commonly known as: PROTONIX Take 1 tablet (40 mg total) by mouth daily. Start taking on: May 10, 2020   pregabalin 150 MG capsule Commonly known as: LYRICA Take 1 capsule (150 mg total) by mouth 3 (three) times daily.   thiamine 100 MG tablet Take 2.5 tablets (250 mg total) by mouth daily. Start taking on: May 10, 2020            Discharge Care Instructions  (From admission, onward)         Start     Ordered   05/09/20 0000  Discharge wound care:       Comments: R. Hip - Comments: Place a foam dressing over the right hip wound, change every 3 days and prn.   05/09/20 0850           Follow-up Information    [facility]. Schedule an appointment as soon as possible for a visit.   Why:  Please follow up with [facility] for primary care physician follow up once you are able to discharge from the skilled nursing facility. Contact information: [place] [address] [phone]       Newt Minion, MD. Schedule an appointment as soon as possible for a visit in 1 week(s).   Specialty: Orthopedic Surgery Contact information: [address] [place] [address] [phone]               Major procedures and Radiology Reports - PLEASE review detailed and final reports thoroughly  -       DG Chest [facility] 1 View  Result Date: 04/16/2020 CLINICAL DATA:  Sepsis after obstetrical procedure. Poor venous access. EXAM: PORTABLE CHEST 1 VIEW COMPARISON:  04/15/2020 FINDINGS: Cardiac enlargement. No vascular congestion. Developing infiltration in the left lung base may represent atelectasis or developing pneumonia. Right lung is clear. Mediastinal contours appear intact. A left central venous catheter has been placed with tip projecting over the likely region of the brachiocephalic vein. No pneumothorax. 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 IMPRESSION: Cardiac enlargement. Developing infiltration or atelectasis in the left lung base. Left central venous catheter tip projects over the brachiocephalic vein. Electronically Signed   By: Lucienne Capers M.D.   On: 04/16/2020 21:17   DG Chest Portable 1 View  Result Date: 04/15/2020 CLINICAL DATA:  Fever EXAM: PORTABLE CHEST 1 VIEW COMPARISON:  04/09/2020 FINDINGS: Heart is normal size. Linear atelectasis at the right base. No confluent opacities otherwise. No effusions. No acute bony abnormality. Sclerosis in the proximal left humerus compatible with bone infarct or enchondroma. IMPRESSION: Right base atelectasis. No active disease. Electronically Signed  By: Rolm Baptise M.D.   On: 04/15/2020 22:48   DG Chest Portable 1  View  Result Date: 04/09/2020 CLINICAL DATA:  Fall Altered mental status EXAM: PORTABLE CHEST 1 VIEW COMPARISON:  03/28/2020 FINDINGS: The heart size and mediastinal contours are within normal limits. Both lungs are clear. Benign-appearing chondroid lesion again seen in the left proximal humeral diaphysis. It is unchanged dating back to 07/26/2019. IMPRESSION: No acute cardiopulmonary process. Electronically Signed   By: Miachel Roux M.D.   On: 04/09/2020 12:45   DG Foot Complete Left  Result Date: 04/15/2020 CLINICAL DATA:  Left heel pain. EXAM: LEFT FOOT - COMPLETE 3+ VIEW COMPARISON:  March 30, 2020. FINDINGS: Increased lucency is seen involving the bone fragments of the comminuted fracture involving the posterior calcaneus noted on prior exam. This is concerning for lytic destruction, most likely due to osteomyelitis. Increased soft tissue swelling is noted superior to the posterior calcaneus suggesting inflammation. IMPRESSION: Increased lucency is seen involving the fragments of the comminuted fracture involving the posterior calcaneus noted on prior exam. This is concerning for osteomyelitis and MRI is recommended for further evaluation. Electronically Signed   By: Marijo Conception M.D.   On: 04/15/2020 11:00   ECHOCARDIOGRAM COMPLETE  Result Date: 04/17/2020    ECHOCARDIOGRAM REPORT   Patient Name:   JULIANO MCEACHIN Date of Exam: 04/17/2020 Medical Rec #:  [id number]      Height:       72.0 in Accession #:    [id number]     Weight:       156.5 lb Date of Birth:  Nov 04, 1955     BSA:          1.920 m Patient Age:    44 years       BP:           100/64 mmHg Patient Gender: M              HR:           81 bpm. Exam Location:  Inpatient Procedure: 2D Echo, 3D Echo, Cardiac Doppler and Color Doppler Indications:    Fever.  History:        Patient has prior history of Echocardiogram examinations, most                 recent 08/03/2019. Signs/Symptoms:Altered Mental Status and                 Bacteremia. Cancer.   Sonographer:    Roseanna Rainbow RDCS Referring Phys: [place]  1. Left ventricular ejection fraction, by estimation, is 60 to 65%. The left ventricle has normal function. The left ventricle has no regional wall motion abnormalities. There is mild left ventricular hypertrophy. Left ventricular diastolic parameters were normal.  2. Right ventricular systolic function is normal. The right ventricular size is normal. There is normal pulmonary artery systolic pressure.  3. Some thickening of the base of the anterior mitral leaflet does not look like a vegetation . The mitral valve is abnormal. Trivial mitral valve regurgitation. No evidence of mitral stenosis.  4. The aortic valve is normal in structure. Aortic valve regurgitation is not visualized. No aortic stenosis is present.  5. Aortic dilatation noted. There is mild dilatation of the ascending aorta, measuring 41 mm.  6. The inferior vena cava is normal in size with greater than 50% respiratory variability, suggesting right atrial pressure of 3 mmHg. FINDINGS  Left Ventricle: Left ventricular ejection fraction,  by estimation, is 60 to 65%. The left ventricle has normal function. The left ventricle has no regional wall motion abnormalities. The left ventricular internal cavity size was normal in size. There is  mild left ventricular hypertrophy. Left ventricular diastolic parameters were normal. Right Ventricle: The right ventricular size is normal. No increase in right ventricular wall thickness. Right ventricular systolic function is normal. There is normal pulmonary artery systolic pressure. The tricuspid regurgitant velocity is 2.30 m/s, and  with an assumed right atrial pressure of 3 mmHg, the estimated right ventricular systolic pressure is [id number] mmHg. Left Atrium: Left atrial size was normal in size. Right Atrium: Right atrial size was normal in size. Pericardium: There is no evidence of pericardial effusion. Mitral Valve: Some thickening of  the base of the anterior mitral leaflet does not look like a vegetation. The mitral valve is abnormal. Trivial mitral valve regurgitation. No evidence of mitral valve stenosis. Tricuspid Valve: The tricuspid valve is normal in structure. Tricuspid valve regurgitation is mild . No evidence of tricuspid stenosis. Aortic Valve: The aortic valve is normal in structure. Aortic valve regurgitation is not visualized. No aortic stenosis is present. Pulmonic Valve: The pulmonic valve was normal in structure. Pulmonic valve regurgitation is not visualized. No evidence of pulmonic stenosis. Aorta: The aortic root is normal in size and structure and aortic dilatation noted. There is mild dilatation of the ascending aorta, measuring 41 mm. Venous: The inferior vena cava is normal in size with greater than 50% respiratory variability, suggesting right atrial pressure of 3 mmHg. IAS/Shunts: No atrial level shunt detected by color flow Doppler.  LEFT VENTRICLE PLAX 2D LVIDd:         5.60 cm      Diastology LVIDs:         3.50 cm      LV e' medial:    8.95 cm/s LV PW:         1.40 cm      LV E/e' medial:  9.8 LV IVS:        1.20 cm      LV e' lateral:   10.30 cm/s LVOT diam:     2.30 cm      LV E/e' lateral: 8.5 LV SV:         101 LV SV Index:   53 LVOT Area:     4.15 cm  LV Volumes (MOD) LV vol d, MOD A2C: 149.0 ml LV vol d, MOD A4C: 118.0 ml LV vol s, MOD A2C: 56.0 ml LV vol s, MOD A4C: 54.3 ml LV SV MOD A2C:     93.0 ml LV SV MOD A4C:     118.0 ml LV SV MOD BP:      77.2 ml RIGHT VENTRICLE             IVC RV S prime:     14.70 cm/s  IVC diam: 1.70 cm TAPSE (M-mode): 2.0 cm LEFT ATRIUM             Index       RIGHT ATRIUM           Index LA diam:        3.75 cm 1.95 cm/m  RA Area:     18.40 cm LA Vol (A2C):   50.2 ml 26.15 ml/m RA Volume:   49.50 ml  25.78 ml/m LA Vol (A4C):   37.6 ml 19.59 ml/m LA Biplane Vol: 48.0 ml 25.00 ml/m  AORTIC VALVE LVOT Vmax:  115.00 cm/s LVOT Vmean:  78.800 cm/s LVOT VTI:    0.244 m  AORTA Ao  Root diam: 4.50 cm Ao Asc diam:  4.10 cm MITRAL VALVE               TRICUSPID VALVE MV Area (PHT): 4.06 cm    TR Peak grad:   21.2 mmHg MV Decel Time: 187 msec    TR Vmax:        230.00 cm/s MV E velocity: 87.80 cm/s MV A velocity: 89.20 cm/s  SHUNTS MV E/A ratio:  0.98        Systemic VTI:  0.24 m                            Systemic Diam: 2.30 cm Jenkins Rouge MD Electronically signed by Jenkins Rouge MD Signature Date/Time: 04/17/2020/3:17:27 PM    Final    ECHO TEE  Result Date: 04/21/2020    TRANSESOPHOGEAL ECHO REPORT   Patient Name:   JALONI DAVOLI Date of Exam: 04/21/2020 Medical Rec #:  [id number]      Height:       72.0 in Accession #:    [id number]     Weight:       156.5 lb Date of Birth:  11-25-1955     BSA:          1.920 m Patient Age:    24 years       BP:           112/67 mmHg Patient Gender: M              HR:           81 bpm. Exam Location:  Inpatient Procedure: Transesophageal Echo, Cardiac Doppler and Color Doppler Indications:     Bacteremia  History:         Patient has prior history of Echocardiogram examinations, most                  recent 04/17/2020. Signs/Symptoms:Bacteremia; Risk                  Factors:Current Smoker. Hep. C., substance abuse.  Sonographer:     Dustin Flock Referring Phys:  [phone] JILL D MCDANIEL Diagnosing Phys: Buford Dresser MD PROCEDURE: After discussion of the risks and benefits of a TEE, an informed consent was obtained from the patient. The transesophogeal probe was passed without difficulty through the esophogus of the patient. Sedation performed by different physician. The patient was monitored while under deep sedation. Anesthestetic sedation was provided intravenously by Anesthesiology: 211mg  of Propofol, 60mg  of Lidocaine. Image quality was good. The patient's vital signs; including heart rate, blood pressure, and oxygen saturation; remained stable throughout the procedure. The patient developed no complications during the procedure. IMPRESSIONS   1. Left ventricular ejection fraction, by estimation, is 60 to 65%. The left ventricle has normal function. The left ventricle has no regional wall motion abnormalities.  2. Right ventricular systolic function is normal. The right ventricular size is normal.  3. No left atrial/left atrial appendage thrombus was detected.  4. The mitral valve is degenerative. Trivial mitral valve regurgitation.  5. The aortic valve is tricuspid. Aortic valve regurgitation is not visualized. No aortic stenosis is present. Conclusion(s)/Recommendation(s): No evidence of vegetation/infective endocarditis on this transesophageal echocardiogram. FINDINGS  Left Ventricle: Left ventricular ejection fraction, by estimation, is 60 to 65%. The left ventricle has normal function. The left ventricle  has no regional wall motion abnormalities. The left ventricular internal cavity size was normal in size. Right Ventricle: The right ventricular size is normal. No increase in right ventricular wall thickness. Right ventricular systolic function is normal. Left Atrium: Left atrial size was normal in size. No left atrial/left atrial appendage thrombus was detected. Right Atrium: Right atrial size was normal in size. Pericardium: Trivial pericardial effusion is present. Mitral Valve: Mildly degenerative at leaflet tips, without evidence of vegetation. The mitral valve is degenerative in appearance. Trivial mitral valve regurgitation. There is no evidence of mitral valve vegetation. Tricuspid Valve: The tricuspid valve is normal in structure. Tricuspid valve regurgitation is mild. There is no evidence of tricuspid valve vegetation. Aortic Valve: The aortic valve is tricuspid. Aortic valve regurgitation is not visualized. No aortic stenosis is present. Aortic valve peak gradient measures 7.7 mmHg. There is no evidence of aortic valve vegetation. Pulmonic Valve: The pulmonic valve was grossly normal. Pulmonic valve regurgitation is trivial. There is no  evidence of pulmonic valve vegetation. Aorta: The aortic root is normal in size and structure. IAS/Shunts: No atrial level shunt detected by color flow Doppler.  AORTIC VALVE AV Vmax:      139.00 cm/s AV Peak Grad: 7.7 mmHg Buford Dresser MD Electronically signed by Buford Dresser MD Signature Date/Time: 04/21/2020/2:06:56 PM    Final    [place] EKG SITE RITE  Result Date: 04/23/2020 If Site Rite image not attached, placement could not be confirmed due to current cardiac rhythm.   Micro Results     No results found for this or any previous visit (from the past 240 hour(s)).  Today   Subjective    Andrew Chaney today has no headache,no chest abdominal pain,no new weakness tingling or numbness, feels much better     Objective   Blood pressure 104/67, pulse 60, temperature 98.7 F (37.1 C), resp. rate 19, height 6' (1.829 m), weight 71 kg, SpO2 99 %.  No intake or output data in the 24 hours ending 05/09/20 0852  Exam  Awake Alert, No new F.N deficits, Normal affect [place].AT,PERRAL Supple Neck,No JVD, No cervical lymphadenopathy appriciated.  Symmetrical Chest wall movement, Good air movement bilaterally, CTAB RRR,No Gallops,Rubs or new Murmurs, No Parasternal Heave +ve B.Sounds, Abd Soft, Non tender, No organomegaly appriciated, No rebound -guarding or rigidity. No Cyanosis, L BKA stump clean, staples in place   Data Review   CBC w Diff:  Lab Results  Component Value Date   WBC 4.8 05/03/2020   HGB 11.7 (L) 05/03/2020   HCT 36.5 (L) 05/03/2020   PLT 357 05/03/2020   LYMPHOPCT 5 04/16/2020   MONOPCT 11 04/16/2020   EOSPCT 0 04/16/2020   BASOPCT 0 04/16/2020    CMP:  Lab Results  Component Value Date   NA 139 05/03/2020   K 3.9 05/03/2020   CL 105 05/03/2020   CO2 27 05/03/2020   BUN 22 05/03/2020   CREATININE 0.66 05/03/2020   PROT 5.8 (L) 05/03/2020   ALBUMIN 2.9 (L) 05/03/2020   BILITOT 0.7 05/03/2020   ALKPHOS 49 05/03/2020   AST 19 05/03/2020    ALT 20 05/03/2020  .   Total Time in preparing paper work, data evaluation and todays exam - 32 minutes  Lala Lund M.D on 05/09/2020 at 8:52 AM  [facility]

## 2020-05-09 NOTE — Discharge Instructions (Signed)
Follow with Primary MD in 7 days   Get CBC, CMP  -  checked next visit within 1 week by Primary MD or SNF MD   Activity: Must wear the leg shrinker provided on your amputated stump as instructed by orthopedics in the daytime, activity as tolerated with Full fall precautions use walker/cane & assistance as needed  Disposition SNF  Diet: Heart Healthy   Special Instructions: If you have smoked or chewed Tobacco  in the last 2 yrs please stop smoking, stop any regular Alcohol  and or any Recreational drug use.  On your next visit with your primary care physician please Get Medicines reviewed and adjusted.  Please request your Prim.MD to go over all Hospital Tests and Procedure/Radiological results at the follow up, please get all Hospital records sent to your Prim MD by signing hospital release before you go home.  If you experience worsening of your admission symptoms, develop shortness of breath, life threatening emergency, suicidal or homicidal thoughts you must seek medical attention immediately by calling 911 or calling your MD immediately  if symptoms less severe.  You Must read complete instructions/literature along with all the possible adverse reactions/side effects for all the Medicines you take and that have been prescribed to you. Take any new Medicines after you have completely understood and accpet all the possible adverse reactions/side effects.

## 2020-05-10 NOTE — Plan of Care (Signed)
Problem: Education: Goal: Knowledge of General Education information will improve Description: Including pain rating scale, medication(s)/side effects and non-pharmacologic comfort measures 05/10/2020 0016 by Jenene Slicker, RN Outcome: Adequate for Discharge 05/10/2020 0015 by Jenene Slicker, RN Outcome: Adequate for Discharge   Problem: Health Behavior/Discharge Planning: Goal: Ability to manage health-related needs will improve 05/10/2020 0016 by Jenene Slicker, RN Outcome: Adequate for Discharge 05/10/2020 0015 by Jenene Slicker, RN Outcome: Adequate for Discharge   Problem: Clinical Measurements: Goal: Ability to maintain clinical measurements within normal limits will improve 05/10/2020 0016 by Jenene Slicker, RN Outcome: Adequate for Discharge 05/10/2020 0015 by Jenene Slicker, RN Outcome: Adequate for Discharge Goal: Will remain free from infection 05/10/2020 0016 by Jenene Slicker, RN Outcome: Adequate for Discharge 05/10/2020 0015 by Jenene Slicker, RN Outcome: Adequate for Discharge Goal: Diagnostic test results will improve 05/10/2020 0016 by Jenene Slicker, RN Outcome: Adequate for Discharge 05/10/2020 0015 by Jenene Slicker, RN Outcome: Adequate for Discharge Goal: Respiratory complications will improve 05/10/2020 0016 by Jenene Slicker, RN Outcome: Adequate for Discharge 05/10/2020 0015 by Jenene Slicker, RN Outcome: Adequate for Discharge Goal: Cardiovascular complication will be avoided 05/10/2020 0016 by Jenene Slicker, RN Outcome: Adequate for Discharge 05/10/2020 0015 by Jenene Slicker, RN Outcome: Adequate for Discharge   Problem: Activity: Goal: Risk for activity intolerance will decrease 05/10/2020 0016 by Jenene Slicker, RN Outcome: Adequate for Discharge 05/10/2020 0015 by Jenene Slicker, RN Outcome: Adequate for Discharge   Problem: Nutrition: Goal: Adequate nutrition will be maintained 05/10/2020 0016 by Jenene Slicker, RN Outcome:  Adequate for Discharge 05/10/2020 0015 by Jenene Slicker, RN Outcome: Adequate for Discharge   Problem: Coping: Goal: Level of anxiety will decrease 05/10/2020 0016 by Jenene Slicker, RN Outcome: Adequate for Discharge 05/10/2020 0015 by Jenene Slicker, RN Outcome: Adequate for Discharge   Problem: Elimination: Goal: Will not experience complications related to bowel motility 05/10/2020 0016 by Jenene Slicker, RN Outcome: Adequate for Discharge 05/10/2020 0015 by Jenene Slicker, RN Outcome: Adequate for Discharge Goal: Will not experience complications related to urinary retention 05/10/2020 0016 by Jenene Slicker, RN Outcome: Adequate for Discharge 05/10/2020 0015 by Jenene Slicker, RN Outcome: Adequate for Discharge   Problem: Pain Managment: Goal: General experience of comfort will improve 05/10/2020 0016 by Jenene Slicker, RN Outcome: Adequate for Discharge 05/10/2020 0015 by Jenene Slicker, RN Outcome: Adequate for Discharge   Problem: Safety: Goal: Ability to remain free from injury will improve 05/10/2020 0016 by Jenene Slicker, RN Outcome: Adequate for Discharge 05/10/2020 0015 by Jenene Slicker, RN Outcome: Adequate for Discharge   Problem: Skin Integrity: Goal: Risk for impaired skin integrity will decrease 05/10/2020 0016 by Jenene Slicker, RN Outcome: Adequate for Discharge 05/10/2020 0015 by Jenene Slicker, RN Outcome: Adequate for Discharge   Problem: Increased Nutrient Needs (NI-5.1) Goal: Food and/or nutrient delivery Description: Individualized approach for food/nutrient provision. Outcome: Adequate for Discharge   Problem: Malnutrition  (NI-5.2) Goal: Food and/or nutrient delivery Description: Individualized approach for food/nutrient provision. Outcome: Adequate for Discharge   Problem: Acute Rehab PT Goals(only PT should resolve) Goal: Patient Will Transfer Sit To/From Stand Outcome: Adequate for Discharge Goal: Pt Will Transfer Bed To  Chair/Chair To Bed Outcome: Adequate for Discharge Goal: Pt Will Ambulate Outcome: Adequate for Discharge Goal: Pt/caregiver will Perform Home Exercise Program Outcome: Adequate for Discharge Goal: PT Additional Goal #1 Outcome: Adequate  for Discharge   Problem: Acute Rehab OT Goals (only OT should resolve) Goal: Pt. Will Perform Eating Outcome: Adequate for Discharge Goal: Pt. Will Perform Grooming Outcome: Adequate for Discharge Goal: Pt. Will Perform Upper Body Bathing Outcome: Adequate for Discharge Goal: Pt. Will Perform Lower Body Bathing Outcome: Adequate for Discharge Goal: Pt. Will Perform Lower Body Dressing Outcome: Adequate for Discharge Goal: Pt. Will Transfer To Toilet Outcome: Adequate for Discharge Goal: Pt. Will Perform Toileting-Clothing Manipulation Outcome: Adequate for Discharge

## 2020-05-10 NOTE — Plan of Care (Signed)

## 2020-05-15 ENCOUNTER — Ambulatory Visit (INDEPENDENT_AMBULATORY_CARE_PROVIDER_SITE_OTHER): Payer: Self-pay | Admitting: Physician Assistant

## 2020-05-15 ENCOUNTER — Encounter: Payer: Self-pay | Admitting: Orthopedic Surgery

## 2020-05-15 DIAGNOSIS — Z89512 Acquired absence of left leg below knee: Secondary | ICD-10-CM

## 2020-05-15 NOTE — Progress Notes (Addendum)
Office Visit Note   Patient: Andrew Chaney           Date of Birth: 04-Dec-1955           MRN: [id number] Visit Date: 05/15/2020              Requested by: No referring provider defined for this encounter. PCP: Patient, No Pcp Per (Inactive)  Chief Complaint  Patient presents with  . Left Leg - Routine Post Op    04/16/20 left BKA      HPI: Patient is 1 month status post left below-knee amputation.  He is homeless but currently in a skilled nursing facility.  He did have shrinkers in the hospital but they did not appear to be come with him when he was transferred.  He is overall doing well.  He is having neuropathic pain as well as difficulty sleeping at night.  He does say that he has [employer] but there was a mistake made and he was classified as a family rather than a single person.  He is hoping after the nursing facility to go and live and work for his brother  Assessment & Plan: Visit Diagnoses: No diagnosis found.  Plan: Patient will be provided a prescription for a prosthetic for [employer].  I will also provide a prescription for new shrinkers which she should wear against the skin around the clock.  Change out to a new shrinker daily.  I do not have access to any of his nursing facility records but I will write a prescription for them to begin gabapentin 300 mg 3 times daily.  Also suggest something for sleep.  We will also write an order for him to work with the social worker at his nursing facility to address his Medicaid issues.  We will follow-up with [place] in 1 month Patient is a new left transtibial  amputee.  Patient's current comorbidities are not expected to impact the ability to function with the prescribed prosthesis. Patient verbally communicates a strong desire to use a prosthesis. Patient currently requires mobility aids to ambulate without a prosthesis.  Expects not to use mobility aids with a new prosthesis.  Patient is a K2 level ambulator that will use a  prosthesis to walk around their home and the community over low level environmental barriers.    Follow-Up Instructions: No follow-ups on file.   Ortho Exam  Patient is alert, oriented, no adenopathy, well-dressed, normal affect, normal respiratory effort. No results found.  Below-knee amputation stump is well-healed swelling is overall well controlled no cellulitis or signs of infection   Labs: Lab Results  Component Value Date   ESRSEDRATE 5 05/03/2020   ESRSEDRATE 38 (H) 04/15/2020   CRP <0.5 05/03/2020   CRP 19.5 (H) 04/15/2020   REPTSTATUS 04/22/2020 FINAL 04/17/2020   CULT  04/17/2020    NO GROWTH 5 DAYS Performed at [facility], [facility] [address] [place], [place] [address]    LABORGA METHICILLIN RESISTANT STAPHYLOCOCCUS AUREUS 04/15/2020     Lab Results  Component Value Date   ALBUMIN 2.9 (L) 05/03/2020   ALBUMIN 2.3 (L) 04/23/2020   ALBUMIN 1.9 (L) 04/20/2020    Lab Results  Component Value Date   MG 1.9 05/03/2020   MG 2.0 04/21/2020   MG 2.0 04/17/2020   No results found for: VD25OH  No results found for: PREALBUMIN CBC EXTENDED Latest Ref Rng & Units 05/03/2020 04/29/2020 04/23/2020  WBC 4.0 - 10.5 K/uL 4.8 11.8(H) 9.5  RBC 4.22 - 5.81 MIL/uL 3.68(L) 4.49 3.38(L)  HGB 13.0 - 17.0 g/dL 11.7(L) 13.0 10.7(L)  HCT 39.0 - 52.0 % 36.5(L) 38.5(L) 32.7(L)  PLT 150 - 400 K/uL 357 177 506(H)  NEUTROABS 1.7 - 7.7 K/uL - - -  LYMPHSABS 0.7 - 4.0 K/uL - - -     There is no height or weight on file to calculate BMI.  Orders:  No orders of the defined types were placed in this encounter.  No orders of the defined types were placed in this encounter.    Procedures: No procedures performed  Clinical Data: No additional findings.  ROS:  All other systems negative, except as noted in the HPI. Review of Systems  Objective: Vital Signs: There were no vitals taken for this visit.  Specialty Comments:  No specialty comments available.  PMFS  History: Patient Active Problem List   Diagnosis Date Noted  . S/P BKA (below knee amputation) unilateral, left ([place])   . Protein-calorie malnutrition, severe 04/25/2020  . Left foot pain   . IVDU (intravenous drug user)   . Acute osteomyelitis of left calcaneus ([facility])   . Cutaneous abscess of left foot   . Subacute osteomyelitis, left ankle and foot ([place]) 04/15/2020  . Sepsis due to skin infection ([place]) 04/15/2020  . Insomnia 03/23/2020  . Dupuytren's contracture 08/05/2019  . MRSA bacteremia 07/31/2019  . Altered mental status   . Fever 07/30/2019  . Non-purulent Cellulitis of right upper arm 07/30/2019  . 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 Seizure ([place]) 07/26/2019  . Status epilepticus ([place]) 07/26/2019  . Psychosis ([place]) 09/05/2018  . Bipolar disorder, most recent episode manic ([place]) 04/11/2018  . Bipolar I disorder, current or most recent episode manic, with psychotic features ([place]) 04/08/2018  . Confusion 01/03/2015  . Chronic hepatitis C ([place]) 12/20/2012  . Renal failure 12/17/2012  . Acute encephalopathy 12/17/2012  . ARF (acute renal failure) ([place]) 12/17/2012  . Hyponatremia 12/17/2012  . CAP (community acquired pneumonia) 12/17/2012  . Rhabdomyolysis 12/17/2012  . 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[place]) 12/17/2012   Past Medical History:  Diagnosis Date  . Bipolar 1 disorder ([place])   . 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 Renal disorder     Family History  Problem Relation Age of Onset  . Dementia Mother     Past Surgical History:  Procedure Laterality Date  . AMPUTATION Left 04/16/2020   Procedure: AMPUTATION BELOW KNEE;  Surgeon: Newt Minion, MD;  Location: [facility];  Service: Orthopedics;  Laterality: Left;  . TEE WITHOUT CARDIOVERSION N/A 08/03/2019   Procedure: TRANSESOPHAGEAL ECHOCARDIOGRAM (TEE);  Surgeon: Pixie Casino, MD;  Location[facility] ENDOSCOPY;  Service: Cardiovascular;  Laterality: N/A;  . TEE WITHOUT  CARDIOVERSION N/A 04/21/2020   Procedure: TRANSESOPHAGEAL ECHOCARDIOGRAM (TEE);  Surgeon: Buford Dresser, MD;  Location: [facility] ENDOSCOPY;  Service: Cardiovascular;  Laterality: N/A;   Social History   Occupational History  . Not on file  Tobacco Use  . Smoking status: Current Every Day Smoker    Packs/day: 0.50    Types: Cigarettes  . Smokeless tobacco: Never Used  Vaping Use  . Vaping Use: Never used  Substance and Sexual Activity  . Alcohol use: Yes  . Drug use: Yes    Types: Marijuana    Comment: States no longer uses marijuana because he is "on probation"  . Sexual activity: Not Currently

## 2020-05-16 ENCOUNTER — Telehealth: Payer: Self-pay

## 2020-05-16 NOTE — Telephone Encounter (Signed)
Patient would like for Bevely Palmer to call the facility to speak with his nurse to advise her to provide patient with more Boost due to not eating the food that they provide at the facility.  CB# [phone].  Please advise.  Thank you.

## 2020-05-28 ENCOUNTER — Other Ambulatory Visit: Payer: Self-pay

## 2020-05-28 ENCOUNTER — Ambulatory Visit (INDEPENDENT_AMBULATORY_CARE_PROVIDER_SITE_OTHER): Payer: Self-pay | Admitting: Infectious Disease

## 2020-05-28 ENCOUNTER — Encounter: Payer: Self-pay | Admitting: Infectious Disease

## 2020-05-28 VITALS — BP 99/65 | HR 73 | Temp 97.9°F

## 2020-05-28 DIAGNOSIS — M86172 Other acute osteomyelitis, left ankle and foot: Secondary | ICD-10-CM

## 2020-05-28 DIAGNOSIS — A419 Sepsis, unspecified organism: Secondary | ICD-10-CM

## 2020-05-28 DIAGNOSIS — B182 Chronic viral hepatitis C: Secondary | ICD-10-CM

## 2020-05-28 DIAGNOSIS — M86272 Subacute osteomyelitis, left ankle and foot: Secondary | ICD-10-CM

## 2020-05-28 DIAGNOSIS — Z89512 Acquired absence of left leg below knee: Secondary | ICD-10-CM

## 2020-05-28 DIAGNOSIS — B9562 Methicillin resistant Staphylococcus aureus infection as the cause of diseases classified elsewhere: Secondary | ICD-10-CM

## 2020-05-28 DIAGNOSIS — R7881 Bacteremia: Secondary | ICD-10-CM

## 2020-05-28 DIAGNOSIS — L089 Local infection of the skin and subcutaneous tissue, unspecified: Secondary | ICD-10-CM

## 2020-05-28 NOTE — Progress Notes (Signed)
Subjective:    Chief complaint: phantom limb pain    Patient ID: Andrew Chaney, male    DOB: Jul 21, 1955, 65 y.o.   MRN: [id number]  HPI  65 year old man with history  IVDU, homelessness, HCV MRSA bacteremia secondary to abscess and osteomyelitis of left ankle, status post BKA.  TEE does not show evidence of vegetation.  He completed IV antibiotics for roughly 2 weeks in the hospital and then was ultimately discharged on oral doxycycline.  He is currently residing at [facility] facility.  He was scheduled to come see me today in clinic for follow-up of his MRSA bacteremia and also for consideration of treatment of his chronic hepatitis C without hepatic coma.  He still has some pain in his site of where his leg used to be i.e. phantom limb pain.  His other foot has been a bit swollen and edematous.  Past Medical History:  Diagnosis Date  . Bipolar 1 disorder ([place])   . 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 Renal disorder     Past Surgical History:  Procedure Laterality Date  . AMPUTATION Left 04/16/2020   Procedure: AMPUTATION BELOW KNEE;  Surgeon: Newt Minion, MD;  Location: [place];  Service: Orthopedics;  Laterality: Left;  . TEE WITHOUT CARDIOVERSION N/A 08/03/2019   Procedure: TRANSESOPHAGEAL ECHOCARDIOGRAM (TEE);  Surgeon: Pixie Casino, MD;  Location[facility] ENDOSCOPY;  Service: Cardiovascular;  Laterality: N/A;  . TEE WITHOUT CARDIOVERSION N/A 04/21/2020   Procedure: TRANSESOPHAGEAL ECHOCARDIOGRAM (TEE);  Surgeon: Buford Dresser, MD;  Location: [facility] ENDOSCOPY;  Service: Cardiovascular;  Laterality: N/A;    Family History  Problem Relation Age of Onset  . Dementia Mother       Social History   Socioeconomic History  . Marital status: Single    Spouse name: Not on file  . Number of children: Not on file  . Years of education: Not on file  . Highest education level: Not on file  Occupational History  . Not on file  Tobacco Use  .  Smoking status: Current Every Day Smoker    Packs/day: 0.50    Types: Cigarettes  . Smokeless tobacco: Never Used  Vaping Use  . Vaping Use: Never used  Substance and Sexual Activity  . Alcohol use: Yes  . Drug use: Yes    Types: Marijuana    Comment: States no longer uses marijuana because he is "on probation"  . Sexual activity: Not Currently  Other Topics Concern  . Not on file  Social History Narrative  . Not on file   Social Determinants of Health   Financial Resource Strain: Not on file  Food Insecurity: Not on file  Transportation Needs: Not on file  Physical Activity: Not on file  Stress: Not on file  Social Connections: Not on file    No Known Allergies   Current Outpatient Medications:  .  acetaminophen (TYLENOL) 500 MG tablet, Take 1 tablet (500 mg total) by mouth every 8 (eight) hours as needed for moderate pain., Disp: 20 tablet, Rfl: 0 .  ascorbic acid (VITAMIN C) 1000 MG tablet, Take 1 tablet (1,000 mg total) by mouth daily., Disp: , Rfl:  .  cyanocobalamin (,VITAMIN B-12,) 1000 MCG/ML injection, Inject 1 mL (1,000 mcg total) into the skin every 30 (thirty) days., Disp: 1 mL, Rfl: 0 .  doxycycline (VIBRA-TABS) 100 MG tablet, Take 1 tablet (100 mg total) by mouth every 12 (twelve) hours for 23  days., Disp: 46 tablet, Rfl: 0 .  ibuprofen (ADVIL) 400 MG tablet, Take 1 tablet (400 mg total) by mouth every 6 (six) hours as needed for headache or mild pain., Disp: 30 tablet, Rfl: 0 .  levETIRAcetam (KEPPRA) 500 MG tablet, Take 1 tablet (500 mg total) by mouth 2 (two) times daily., Disp: 60 tablet, Rfl: 0 .  lithium carbonate 300 MG capsule, Take 1 capsule (300 mg total) by mouth 2 (two) times daily with a meal., Disp: , Rfl:  .  pantoprazole (PROTONIX) 40 MG tablet, Take 1 tablet (40 mg total) by mouth daily., Disp: , Rfl:  .  pregabalin (LYRICA) 150 MG capsule, Take 1 capsule (150 mg total) by mouth 3 (three) times daily., Disp: , Rfl:  .  thiamine 100 MG tablet,  Take 2.5 tablets (250 mg total) by mouth daily., Disp: , Rfl:  .  folic acid (FOLVITE) 1 MG tablet, Take 1 tablet (1 mg total) by mouth daily., Disp: 30 tablet, Rfl: 0   Review of Systems  Constitutional: Negative for activity change, appetite change, chills, diaphoresis, fatigue, fever and unexpected weight change.  HENT: Negative for congestion, rhinorrhea, sinus pressure, sneezing, sore throat and trouble swallowing.   Eyes: Negative for photophobia and visual disturbance.  Respiratory: Negative for cough, chest tightness, shortness of breath, wheezing and stridor.   Cardiovascular: Negative for chest pain, palpitations and leg swelling.  Gastrointestinal: Negative for abdominal distention, abdominal pain, anal bleeding, blood in stool, constipation, diarrhea, nausea and vomiting.  Genitourinary: Negative for difficulty urinating, dysuria, flank pain and hematuria.  Musculoskeletal: Positive for arthralgias. Negative for back pain, gait problem, joint swelling and myalgias.  Skin: Negative for color change, pallor, rash and wound.  Neurological: Negative for dizziness, tremors, weakness and light-headedness.  Hematological: Negative for adenopathy. Does not bruise/bleed easily.  Psychiatric/Behavioral: Negative for agitation, behavioral problems, confusion, decreased concentration, dysphoric mood and sleep disturbance.       Objective:   Physical Exam Constitutional:      Appearance: He is well-developed.  HENT:     Head: Normocephalic and atraumatic.  Eyes:     Conjunctiva/sclera: Conjunctivae normal.  Cardiovascular:     Rate and Rhythm: Normal rate and regular rhythm.  Pulmonary:     Effort: Pulmonary effort is normal. No respiratory distress.     Breath sounds: No wheezing.  Abdominal:     General: There is no distension.     Palpations: Abdomen is soft.  Musculoskeletal:        General: No tenderness. Normal range of motion.     Cervical back: Normal range of motion and  neck supple.  Skin:    General: Skin is warm and dry.     Coloration: Skin is not pale.     Findings: No erythema or rash.  Neurological:     General: No focal deficit present.     Mental Status: He is alert and oriented to person, place, and time.  Psychiatric:        Mood and Affect: Mood normal.        Behavior: Behavior normal.        Thought Content: Thought content normal.        Judgment: Judgment normal.     Left BKA site May 28, 2020:      Right foot May 28, 2020:            Assessment & Plan:   MRSA bacteremia and hx of myelitis and leg abscess status  post BKA:  Completed roughly 2 weeks of IV antibiotics in the hospital discharged on doxycycline which she is now finishing through 6 weeks total therapy.  BKA site this is well-healed  Chronic hepatitis C without hepatic coma: We will try to get him Mavyret and have him see Cassie when drug is here and we can provide for him to take to SNF and take while he is there  IVDU: He claims to not have been using recently and only mentions 1 episode of sharing a needle with someone in [name] many years ago.  Suspect he is not being completely forthright.  I spent greater than 40 minutes with the patient including greater than 50% of time in face to face counsel of the patient and review of his radiographic records personally and his laboratory microbiological data and in coordination of his care.Marland Kitchen

## 2020-05-28 NOTE — [facility] Notes (Signed)
Met with patient today and had him sign necessary paperwork for [place]. Will send to patient assistance. He will see me on 6/2 to initiate medication.

## 2020-06-02 ENCOUNTER — Telehealth: Payer: Self-pay

## 2020-06-02 NOTE — Telephone Encounter (Signed)
[facility] Patient Advocate Encounter  Completed and sent MYABBVIE application for mavyret for this patient who is uninsured.    Patient assistance phone number for follow up is [phone].   This encounter will be updated until final determination.   Andrew Chaney, [facility] Patient [facility] for [facility] Phone: [phone] Fax:  [phone]

## 2020-06-10 ENCOUNTER — Telehealth: Payer: Self-pay

## 2020-06-10 NOTE — Telephone Encounter (Signed)
[facility] Patient Advocate Encounter  Completed and sent MYABBVIE application for Mavyret for this patient who is uninsured.    Patient is approved 06/08/20 through 12/09/20.    Ileene Patrick, [facility] Patient [facility] for Infectious Disease Phone: [phone] Fax:  [phone]

## 2020-06-12 ENCOUNTER — Encounter: Payer: Self-pay | Admitting: Orthopedic Surgery

## 2020-06-12 ENCOUNTER — Other Ambulatory Visit: Payer: Self-pay

## 2020-06-12 ENCOUNTER — Ambulatory Visit (INDEPENDENT_AMBULATORY_CARE_PROVIDER_SITE_OTHER): Payer: Self-pay | Admitting: Physician Assistant

## 2020-06-12 ENCOUNTER — Ambulatory Visit (INDEPENDENT_AMBULATORY_CARE_PROVIDER_SITE_OTHER): Payer: Self-pay | Admitting: Pharmacist

## 2020-06-12 ENCOUNTER — Encounter: Payer: Medicaid Other | Admitting: Orthopedic Surgery

## 2020-06-12 DIAGNOSIS — Z89512 Acquired absence of left leg below knee: Secondary | ICD-10-CM

## 2020-06-12 DIAGNOSIS — B182 Chronic viral hepatitis C: Secondary | ICD-10-CM

## 2020-06-12 MED ORDER — MAVYRET 100-40 MG PO TABS: 3.0000 | ORAL_TABLET | Freq: Every day | ORAL | 1 refills | Status: AC

## 2020-06-12 NOTE — Progress Notes (Signed)
HPI: Andrew Chaney is a 65 y.o. male who presents to the [facility] for Hepatitis C follow-up.  Medication: Mavyret x 8 weeks  Start Date: Tomorrow  Hepatitis C Genotype:   Hepatitis C RNA: 11.4 million on 7/22  Patient Active Problem List   Diagnosis Date Noted  . S/P BKA (below knee amputation) unilateral, left ([place])   . Protein-calorie malnutrition, severe 04/25/2020  . Left foot pain   . IVDU (intravenous drug user)   . Acute osteomyelitis of left calcaneus (HCC)   . Cutaneous abscess of left foot   . Subacute osteomyelitis, left ankle and foot ([place]) 04/15/2020  . Sepsis due to skin infection ([place]) 04/15/2020  . Insomnia 03/23/2020  . Dupuytren's contracture 08/05/2019  . MRSA bacteremia 07/31/2019  . Altered mental status   . Fever 07/30/2019  . Non-purulent Cellulitis of right upper arm 07/30/2019  . 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 Seizure ([place]) 07/26/2019  . Status epilepticus ([place]) 07/26/2019  . Psychosis ([place]) 09/05/2018  . Bipolar disorder, most recent episode manic ([place]) 04/11/2018  . Bipolar I disorder, current or most recent episode manic, with psychotic features ([place]) 04/08/2018  . Confusion 01/03/2015  . Chronic hepatitis C ([place]) 12/20/2012  . Renal failure 12/17/2012  . Acute encephalopathy 12/17/2012  . ARF (acute renal failure) ([place]) 12/17/2012  . Hyponatremia 12/17/2012  . CAP (community acquired pneumonia) 12/17/2012  . Rhabdomyolysis 12/17/2012  . 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[place]) 12/17/2012    Patient's Medications  New Prescriptions   No medications on file  Previous Medications   ACETAMINOPHEN (TYLENOL) 500 MG TABLET    Take 1 tablet (500 mg total) by mouth every 8 (eight) hours as needed for moderate pain.   ASCORBIC ACID (VITAMIN C) 1000 MG TABLET    Take 1 tablet (1,000 mg total) by mouth daily.   CYANOCOBALAMIN (,VITAMIN B-12,) 1000 MCG/ML INJECTION    Inject 1 mL (1,000 mcg  total) into the skin every 30 (thirty) days.   FOLIC ACID (FOLVITE) 1 MG TABLET    Take 1 tablet (1 mg total) by mouth daily.   IBUPROFEN (ADVIL) 400 MG TABLET    Take 1 tablet (400 mg total) by mouth every 6 (six) hours as needed for headache or mild pain.   LEVETIRACETAM (KEPPRA) 500 MG TABLET    Take 1 tablet (500 mg total) by mouth 2 (two) times daily.   LITHIUM CARBONATE 300 MG CAPSULE    Take 1 capsule (300 mg total) by mouth 2 (two) times daily with a meal.   PANTOPRAZOLE (PROTONIX) 40 MG TABLET    Take 1 tablet (40 mg total) by mouth daily.   PREGABALIN (LYRICA) 150 MG CAPSULE    Take 1 capsule (150 mg total) by mouth 3 (three) times daily.   THIAMINE 100 MG TABLET    Take 2.5 tablets (250 mg total) by mouth daily.  Modified Medications   No medications on file  Discontinued Medications   No medications on file    Allergies: No Known Allergies  Past Medical History: Past Medical History:  Diagnosis Date  . Bipolar 1 disorder ([place])   . 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 Renal disorder     Social History: Social History   Socioeconomic History  . Marital status: Single    Spouse name: Not on file  . Number of children: Not on file  . Years of education: Not on file  .  Highest education level: Not on file  Occupational History  . Not on file  Tobacco Use  . Smoking status: Current Every Day Smoker    Packs/day: 0.50    Types: Cigarettes  . Smokeless tobacco: Never Used  Vaping Use  . Vaping Use: Never used  Substance and Sexual Activity  . Alcohol use: Yes  . Drug use: Yes    Types: Marijuana    Comment: States no longer uses marijuana because he is "on probation"  . Sexual activity: Not Currently  Other Topics Concern  . Not on file  Social History Narrative  . Not on file   Social Determinants of Health   Financial Resource Strain: Not on file  Food Insecurity: Not on file  Transportation Needs: Not on file  Physical Activity: Not on file   Stress: Not on file  Social Connections: Not on file    Labs: Hepatitis C Lab Results  Component Value Date   HCVGENOTYPE 1a 08/02/2019   Hepatitis B Lab Results  Component Value Date   HEPBSAG NON REACTIVE 07/31/2019   HEPBCAB NON REACTIVE 07/31/2019   Hepatitis A No results found for: HAV HIV Lab Results  Component Value Date   HIV Non Reactive 07/26/2019   Lab Results  Component Value Date   CREATININE 0.66 05/03/2020   CREATININE 0.58 (L) 04/29/2020   CREATININE 0.78 04/23/2020   CREATININE 0.68 04/21/2020   CREATININE 0.78 04/20/2020   Lab Results  Component Value Date   AST 19 05/03/2020   AST 29 04/23/2020   AST 33 04/20/2020   ALT 20 05/03/2020   ALT 24 04/23/2020   ALT 19 04/20/2020   INR 1.4 (H) 04/16/2020   INR 1.0 08/02/2019   INR 1.1 07/26/2019    Assessment: Andrew Chaney is here today to pick up his first box of [place].   Counseled patient to take all three tablets of Mavyret daily with food.  Counseled patient the need to take all three tablets together and to not separate them out during the day. Encouraged patient not to miss any doses and explained how their chance of cure could go down with each dose missed. Counseled patient on what to do if dose is missed - if it is closer to the missed dose take immediately; if closer to next dose then skip dose and take the next dose at the usual time.   Counseled patient on common side effects such as headache, fatigue, and nausea and that these normally decrease with time. I reviewed patient medications and found no drug interactions. Discussed with patient that there are several drug interactions with Mavyret and instructed patient to call the clinic if he wishes to start a new medication during course of therapy. Also advised patient to call if him experiences any side effects.   He will follow up in 3-4 weeks to pick up his second box and have labs drawn.   Plan: - Start Mavyret x 8 weeks - F/u with me  again 6/30 on 230pm  Duvall Comes L. Eber Hong, PharmD, BCIDP, AAHIVP, CPP Clinical Pharmacist Practitioner Infectious Diseases [facility] 06/12/2020, 1:40 PM

## 2020-06-12 NOTE — Progress Notes (Signed)
Office Visit Note   Patient: Andrew Chaney           Date of Birth: [birth year]           MRN: [id number] Visit Date: 06/12/2020              Requested by: No referring provider defined for this encounter. PCP: Patient, No Pcp Per (Inactive)  Chief Complaint  Patient presents with  . Left Leg - Routine Post Op    04/16/2020 left BKA       HPI: Patient is a pleasant 65 year old gentleman who is 2 months status post left below-knee amputation.  He has been at a nursing facility.  At our last visit a prescription was provided for him to begin getting a prosthetic per his report but this has not been done yet.  He has found his shrinkers  Assessment & Plan: Visit Diagnoses: No diagnosis found.  Plan: New prescription given for prosthetic.  I do not have any paperwork from the nursing facility so have sent them a note saying that this is most important follow-up in 1 month  Follow-Up Instructions: No follow-ups on file.   Ortho Exam  Patient is alert, oriented, no adenopathy, well-dressed, normal affect, normal respiratory effort. Well-healed surgical incision moderate soft tissue swelling but no erythema no ascending cellulitis no concerns for infection  Imaging: No results found. No images are attached to the encounter.  Labs: Lab Results  Component Value Date   ESRSEDRATE 5 05/03/2020   ESRSEDRATE 38 (H) 04/15/2020   CRP <0.5 05/03/2020   CRP 19.5 (H) 04/15/2020   REPTSTATUS 04/22/2020 FINAL 04/17/2020   CULT  04/17/2020    NO GROWTH 5 DAYS Performed at [facility], [place] [address] [place], [place] [address]    LABORGA METHICILLIN RESISTANT STAPHYLOCOCCUS AUREUS 04/15/2020     Lab Results  Component Value Date   ALBUMIN 2.9 (L) 05/03/2020   ALBUMIN 2.3 (L) 04/23/2020   ALBUMIN 1.9 (L) 04/20/2020    Lab Results  Component Value Date   MG 1.9 05/03/2020   MG 2.0 04/21/2020   MG 2.0 04/17/2020   No results found for: VD25OH  No results found for:  PREALBUMIN CBC EXTENDED Latest Ref Rng & Units 05/03/2020 04/29/2020 04/23/2020  WBC 4.0 - 10.5 K/uL 4.8 11.8(H) 9.5  RBC 4.22 - 5.81 MIL/uL 3.68(L) 4.49 3.38(L)  HGB 13.0 - 17.0 g/dL 11.7(L) 13.0 10.7(L)  HCT 39.0 - 52.0 % 36.5(L) 38.5(L) 32.7(L)  PLT 150 - 400 K/uL 357 177 506(H)  NEUTROABS 1.7 - 7.7 K/uL - - -  LYMPHSABS 0.7 - 4.0 K/uL - - -     There is no height or weight on file to calculate BMI.  Orders:  No orders of the defined types were placed in this encounter.  No orders of the defined types were placed in this encounter.    Procedures: No procedures performed  Clinical Data: No additional findings.  ROS:  All other systems negative, except as noted in the HPI. Review of Systems  Objective: Vital Signs: There were no vitals taken for this visit.  Specialty Comments:  No specialty comments available.  PMFS History: Patient Active Problem List   Diagnosis Date Noted  . S/P BKA (below knee amputation) unilateral, left ([place])   . Protein-calorie malnutrition, severe 04/25/2020  . Left foot pain   . IVDU (intravenous drug user)   . Acute osteomyelitis of left calcaneus (HCC)   . Cutaneous abscess of  left foot   . Subacute osteomyelitis, left ankle and foot ([place]) 04/15/2020  . Sepsis due to skin infection ([place]) 04/15/2020  . Insomnia 03/23/2020  . Dupuytren's contracture 08/05/2019  . MRSA bacteremia 07/31/2019  . Altered mental status   . Fever 07/30/2019  . Non-purulent Cellulitis of right upper arm 07/30/2019  . 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 Seizure ([place]) 07/26/2019  . Status epilepticus ([place]) 07/26/2019  . Psychosis ([place]) 09/05/2018  . Bipolar disorder, most recent episode manic ([place]) 04/11/2018  . Bipolar I disorder, current or most recent episode manic, with psychotic features ([place]) 04/08/2018  . Confusion 01/03/2015  . Chronic hepatitis C ([place]) 12/20/2012  . Renal failure 12/17/2012  . Acute  encephalopathy 12/17/2012  . ARF (acute renal failure) ([place]) 12/17/2012  . Hyponatremia 12/17/2012  . CAP (community acquired pneumonia) 12/17/2012  . Rhabdomyolysis 12/17/2012  . 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[place]) 12/17/2012   Past Medical History:  Diagnosis Date  . Bipolar 1 disorder ([place])   . 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 Renal disorder     Family History  Problem Relation Age of Onset  . Dementia Mother     Past Surgical History:  Procedure Laterality Date  . AMPUTATION Left 04/16/2020   Procedure: AMPUTATION BELOW KNEE;  Surgeon: Newt Minion, MD;  Location: [facility];  Service: Orthopedics;  Laterality: Left;  . TEE WITHOUT CARDIOVERSION N/A 08/03/2019   Procedure: TRANSESOPHAGEAL ECHOCARDIOGRAM (TEE);  Surgeon: Pixie Casino, MD;  Location: [facility] ENDOSCOPY;  Service: Cardiovascular;  Laterality: N/A;  . TEE WITHOUT CARDIOVERSION N/A 04/21/2020   Procedure: TRANSESOPHAGEAL ECHOCARDIOGRAM (TEE);  Surgeon: Buford Dresser, MD;  Location: [facility] ENDOSCOPY;  Service: Cardiovascular;  Laterality: N/A;   Social History   Occupational History  . Not on file  Tobacco Use  . Smoking status: Current Every Day Smoker    Packs/day: 0.50    Types: Cigarettes  . Smokeless tobacco: Never Used  Vaping Use  . Vaping Use: Never used  Substance and Sexual Activity  . Alcohol use: Yes  . Drug use: Yes    Types: Marijuana    Comment: States no longer uses marijuana because he is "on probation"  . Sexual activity: Not Currently

## 2020-06-13 ENCOUNTER — Telehealth: Payer: Self-pay | Admitting: Physician Assistant

## 2020-06-16 ENCOUNTER — Telehealth: Payer: Self-pay

## 2020-06-16 NOTE — Telephone Encounter (Signed)
Spoke with patient pharmacy to clarify order/prescriber. Gave verbal order for Mayvret. Medication is to be delivered to our office.   Sherina Stammer Lorita Officer, RN

## 2020-07-02 ENCOUNTER — Emergency Department (HOSPITAL_COMMUNITY): Payer: Self-pay

## 2020-07-02 ENCOUNTER — Emergency Department (HOSPITAL_COMMUNITY)
Admission: EM | Admit: 2020-07-02 | Discharge: 2020-07-02 | Disposition: A | Discharge status: Home / Self Care | Payer: Self-pay | Attending: Emergency Medicine | Admitting: Emergency Medicine

## 2020-07-02 ENCOUNTER — Other Ambulatory Visit: Payer: Self-pay

## 2020-07-02 DIAGNOSIS — Z8547 Personal history of malignant neoplasm of testis: Secondary | ICD-10-CM | POA: Insufficient documentation

## 2020-07-02 DIAGNOSIS — Z79899 Other long term (current) drug therapy: Secondary | ICD-10-CM | POA: Insufficient documentation

## 2020-07-02 DIAGNOSIS — F17210 Nicotine dependence, cigarettes, uncomplicated: Secondary | ICD-10-CM | POA: Insufficient documentation

## 2020-07-02 DIAGNOSIS — R109 Unspecified abdominal pain: Secondary | ICD-10-CM | POA: Insufficient documentation

## 2020-07-02 DIAGNOSIS — Z20822 Contact with and (suspected) exposure to covid-19: Secondary | ICD-10-CM | POA: Insufficient documentation

## 2020-07-02 DIAGNOSIS — R0789 Other chest pain: Secondary | ICD-10-CM | POA: Insufficient documentation

## 2020-07-02 DIAGNOSIS — R52 Pain, unspecified: Secondary | ICD-10-CM

## 2020-07-02 LAB — BASIC METABOLIC PANEL
Anion gap: 12 (ref 5–15)
BUN: 15 mg/dL (ref 8–23)
CO2: 21 mmol/L — ABNORMAL LOW (ref 22–32)
Calcium: 9.8 mg/dL (ref 8.9–10.3)
Chloride: 104 mmol/L (ref 98–111)
Creatinine, Ser: 0.63 mg/dL (ref 0.61–1.24)
GFR, Estimated: >60 mL/min (ref >60)
Glucose, Bld: 106 mg/dL — ABNORMAL HIGH (ref 70–99)
Potassium: 3.4 mmol/L — ABNORMAL LOW (ref 3.5–5.1)
Sodium: 137 mmol/L (ref 135–145)

## 2020-07-02 LAB — CBC WITH DIFFERENTIAL/PLATELET
Abs Immature Granulocytes: 0.19 10*3/uL — ABNORMAL HIGH (ref 0.00–0.07)
Basophils Absolute: 0 10*3/uL (ref 0.0–0.1)
Basophils Relative: 0 %
Eosinophils Absolute: 0 10*3/uL (ref 0.0–0.5)
Eosinophils Relative: 0 %
HCT: 37.7 % — ABNORMAL LOW (ref 39.0–52.0)
Hemoglobin: 12.1 g/dL — ABNORMAL LOW (ref 13.0–17.0)
Immature Granulocytes: 1 %
Lymphocytes Relative: 9 %
Lymphs Abs: 1.1 10*3/uL (ref 0.7–4.0)
MCH: 30.3 pg (ref 26.0–34.0)
MCHC: 32.1 g/dL (ref 30.0–36.0)
MCV: 94.3 fL (ref 80.0–100.0)
Monocytes Absolute: 0.9 10*3/uL (ref 0.1–1.0)
Monocytes Relative: 7 %
Neutro Abs: 10.9 10*3/uL — ABNORMAL HIGH (ref 1.7–7.7)
Neutrophils Relative %: 83 %
Platelets: 342 10*3/uL (ref 150–400)
RBC: 4 MIL/uL — ABNORMAL LOW (ref 4.22–5.81)
RDW: 12.6 % (ref 11.5–15.5)
WBC: 13.2 10*3/uL — ABNORMAL HIGH (ref 4.0–10.5)
nRBC: 0 % (ref 0.0–0.2)

## 2020-07-02 LAB — LACTIC ACID, PLASMA: Lactic Acid, Venous: 0.8 mmol/L (ref 0.5–1.9)

## 2020-07-02 LAB — RESP PANEL BY RT-PCR (FLU A&B, COVID) ARPGX2
Influenza A by PCR: NEGATIVE
Influenza B by PCR: NEGATIVE
SARS Coronavirus 2 by RT PCR: NEGATIVE

## 2020-07-02 LAB — LITHIUM LEVEL: Lithium Lvl: <0.06 mmol/L — ABNORMAL LOW (ref 0.60–1.20)

## 2020-07-02 NOTE — ED Provider Notes (Signed)
[facility] DEPT Provider Note   CSN: [id number] Arrival date & time: 07/02/20  0453     History Chief Complaint  Patient presents with   Flank Pain    right    Andrew Chaney is a 65 y.o. male.  65 year old male with prior medical history as detailed below presents for evaluation.  Patient complains of right-sided chest discomfort.  Patient reports that this is been ongoing for several days.  Additional history is difficult to obtain from the patient.  He is not fully compliant with questions when asked.  He appears to be in no distress.  He denies recent injury or fall.  He denies recent fever.  He reports prior episodes of similar discomfort.  He reports prior evaluations.  He is unable to provide additional details.  The history is provided by the patient and medical records.  Illness Location:  Right-sided chest wall discomfort Severity:  Mild Onset quality:  Unable to specify Timing:  Unable to specify Progression:  Unable to specify Chronicity:  Recurrent     Past Medical History:  Diagnosis Date   Bipolar 1 disorder ([place])    Cancer ([place]) 2011   Germ Cell Seminoma    Renal disorder     Patient Active Problem List   Diagnosis Date Noted   S/P BKA (below knee amputation) unilateral, left ([place])    Protein-calorie malnutrition, severe 04/25/2020   Left foot pain    IVDU (intravenous drug user)    Acute osteomyelitis of left calcaneus ([place])    Cutaneous abscess of left foot    Subacute osteomyelitis, left ankle and foot ([place]) 04/15/2020   Sepsis due to skin infection ([place]) 04/15/2020   Insomnia 03/23/2020   Dupuytren's contracture 08/05/2019   MRSA bacteremia 07/31/2019   Altered mental status    Fever 07/30/2019   Non-purulent Cellulitis of right upper arm 07/30/2019   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[place]) 07/29/2019   Seizure ([place]) 07/26/2019   Status epilepticus ([place]) 07/26/2019   Psychosis ([place])  09/05/2018   Bipolar disorder, most recent episode manic ([place]) 04/11/2018   Bipolar I disorder, current or most recent episode manic, with psychotic features ([place]) 04/08/2018   Confusion 01/03/2015   Chronic hepatitis C ([place]) 12/20/2012   Renal failure 12/17/2012   Acute encephalopathy 12/17/2012   ARF (acute renal failure) ([place]) 12/17/2012   Hyponatremia 12/17/2012   CAP (community acquired pneumonia) 12/17/2012   Rhabdomyolysis 12/17/2012   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[place]) 12/17/2012    Past Surgical History:  Procedure Laterality Date   AMPUTATION Left 04/16/2020   Procedure: AMPUTATION BELOW KNEE;  Surgeon: Newt Minion, MD;  Location: [facility];  Service: Orthopedics;  Laterality: Left;   TEE WITHOUT CARDIOVERSION N/A 08/03/2019   Procedure: TRANSESOPHAGEAL ECHOCARDIOGRAM (TEE);  Surgeon: Pixie Casino, MD;  Location: [facility] ENDOSCOPY;  Service: Cardiovascular;  Laterality: N/A;   TEE WITHOUT CARDIOVERSION N/A 04/21/2020   Procedure: TRANSESOPHAGEAL ECHOCARDIOGRAM (TEE);  Surgeon: Buford Dresser, MD;  Location: [facility] ENDOSCOPY;  Service: Cardiovascular;  Laterality: N/A;       Family History  Problem Relation Age of Onset   Dementia Mother     Social History   Tobacco Use   Smoking status: Every Day    Packs/day: 0.50    Pack years: 0.00    Types: Cigarettes   Smokeless tobacco: Never  Vaping Use   Vaping Use: Never used  Substance Use Topics   Alcohol use: Yes   Drug  use: Yes    Types: Marijuana    Comment: States no longer uses marijuana because he is "on probation"    Home Medications Prior to Admission medications   Medication Sig Start Date End Date Taking? Authorizing Provider  acetaminophen (TYLENOL) 500 MG tablet Take 1 tablet (500 mg total) by mouth every 8 (eight) hours as needed for moderate pain. 05/09/20 05/09/21  Thurnell Lose, MD  ascorbic acid (VITAMIN C) 1000 MG tablet Take 1 tablet (1,000 mg total) by mouth daily. 05/10/20   Thurnell Lose, MD  cyanocobalamin (,VITAMIN B-12,) 1000 MCG/ML injection Inject 1 mL (1,000 mcg total) into the skin every 30 (thirty) days. 05/09/20   Thurnell Lose, MD  folic acid (FOLVITE) 1 MG tablet Take 1 tablet (1 mg total) by mouth daily. 05/09/20   Thurnell Lose, MD  Glecaprevir-Pibrentasvir (MAVYRET) 100-40 MG TABS Take 3 tablets by mouth daily with breakfast. 06/12/20   Kuppelweiser, Cassie L, RPH-CPP  ibuprofen (ADVIL) 400 MG tablet Take 1 tablet (400 mg total) by mouth every 6 (six) hours as needed for headache or mild pain. 05/09/20   Thurnell Lose, MD  levETIRAcetam (KEPPRA) 500 MG tablet Take 1 tablet (500 mg total) by mouth 2 (two) times daily. 05/09/20 06/08/20  Thurnell Lose, MD  lithium carbonate 300 MG capsule Take 1 capsule (300 mg total) by mouth 2 (two) times daily with a meal. 05/09/20   Thurnell Lose, MD  pantoprazole (PROTONIX) 40 MG tablet Take 1 tablet (40 mg total) by mouth daily. 05/10/20   Thurnell Lose, MD  pregabalin (LYRICA) 150 MG capsule Take 1 capsule (150 mg total) by mouth 3 (three) times daily. 05/09/20   Thurnell Lose, MD  thiamine 100 MG tablet Take 2.5 tablets (250 mg total) by mouth daily. 05/10/20   Thurnell Lose, MD    Allergies    Patient has no known allergies.  Review of Systems   Review of Systems  All other systems reviewed and are negative.  Physical Exam Updated Vital Signs BP 115/65   Pulse 83   Temp 99.1 F (37.3 C) (Oral)   Resp 17   Ht 6' (1.829 m)   SpO2 99%   BMI 21.23 kg/m   Physical Exam Vitals and nursing note reviewed.  Constitutional:      General: He is not in acute distress.    Appearance: Normal appearance. He is well-developed.  HENT:     Head: Normocephalic and atraumatic.  Eyes:     Conjunctiva/sclera: Conjunctivae normal.     Pupils: Pupils are equal, round, and reactive to light.  Cardiovascular:     Rate and Rhythm: Normal rate and regular rhythm.     Heart sounds: Normal heart  sounds.  Pulmonary:     Effort: Pulmonary effort is normal. No respiratory distress.     Breath sounds: Normal breath sounds.  Abdominal:     General: There is no distension.     Palpations: Abdomen is soft.     Tenderness: There is no abdominal tenderness.  Musculoskeletal:        General: No deformity. Normal range of motion.     Cervical back: Normal range of motion and neck supple.     Comments: Sp left bka   Skin:    General: Skin is warm and dry.  Neurological:     General: No focal deficit present.     Mental Status: He is alert and oriented to  person, place, and time.    ED Results / Procedures / Treatments   Labs (all labs ordered are listed, but only abnormal results are displayed) Labs Reviewed  BASIC METABOLIC PANEL - Abnormal; Notable for the following components:      Result Value   Potassium 3.4 (*)    CO2 21 (*)    Glucose, Bld 106 (*)    All other components within normal limits  CBC WITH DIFFERENTIAL/PLATELET - Abnormal; Notable for the following components:   WBC 13.2 (*)    RBC 4.00 (*)    Hemoglobin 12.1 (*)    HCT 37.7 (*)    Neutro Abs 10.9 (*)    Abs Immature Granulocytes 0.19 (*)    All other components within normal limits  LITHIUM LEVEL - Abnormal; Notable for the following components:   Lithium Lvl <0.06 (*)    All other components within normal limits  RESP PANEL BY RT-PCR (FLU A&B, COVID) ARPGX2  CULTURE, BLOOD (ROUTINE X 2)  CULTURE, BLOOD (ROUTINE X 2)  LACTIC ACID, PLASMA  LACTIC ACID, PLASMA    EKG None  Radiology DG Chest 1 View  Result Date: 07/02/2020 CLINICAL DATA:  Right-sided pain. EXAM: CHEST  1 VIEW COMPARISON:  Chest x-ray 04/16/2020. FINDINGS: Mediastinum hilar structures normal. Cardiomegaly with mild pulmonary venous congestion. Low lung volumes. Mild infiltrates in the right mid and lower lung cannot be excluded. Bibasilar subsegmental atelectasis. No pleural effusion or pneumothorax. 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 IMPRESSION: 1.  Cardiomegaly with mild pulmonary venous congestion. 2. Lung volumes. Mild infiltrates in the right mid and lower lung cannot be excluded. Bibasilar subsegmental atelectasis. Electronically Signed   By: Marcello Moores  Register   On: 07/02/2020 07:56    Procedures Procedures   Medications Ordered in ED Medications - No data to display  ED Course  I have reviewed the triage vital signs and the nursing notes.  Pertinent labs & imaging results that were available during my care of the patient were reviewed by me and considered in my medical decision making (see chart for details).    MDM Rules/Calculators/A&P                          MDM  MSE complete  Andrew Chaney was evaluated in Emergency Department on 07/02/2020 for the symptoms described in the history of present illness. He was evaluated in the context of the global COVID-19 pandemic, which necessitated consideration that the patient might be at risk for infection with the SARS-CoV-2 virus that causes COVID-19. Institutional protocols and algorithms that pertain to the evaluation of patients at risk for COVID-19 are in a state of rapid change based on information released by regulatory bodies including the CDC and federal and state organizations. These policies and algorithms were followed during the patient's care in the ED.  Presented with complaint of right-sided discomfort of the chest.  Patient was not very forthcoming with additional details.  Work-up in the ED is without evidence of significant acute pathology.  Patient feels improved after his ED evaluation.  He now desires discharge.  He understands need for close follow-up.  Strict return precautions given and understood.  Final Clinical Impression(s) / ED Diagnoses Final diagnoses:  Flank pain    Rx / DC Orders ED Discharge Orders     None        Valarie Merino, MD 07/02/20 1202

## 2020-07-02 NOTE — ED Triage Notes (Signed)
Pt BIB [facility], called out with [employer] for reports of man sleeping in parking lot. After being woken up by [employer], requests EMS transport for right-sided flank pain. Denies kidney stones/urinary sx. No hx stones. States urine is dark. Endorses minimal PO intake recently, 'no real meal in 2 days'. Pt mentioned he is taking less of his lithium. Admitted to RN occasional ETOH usage and crack usage last night. Pt has left leg amputation, personal wheelchair at bedside.  BP 140 palp HR 102 RR 18 SpO2 97% RA CBG 121

## 2020-07-02 NOTE — Discharge Instructions (Addendum)
Return for any problem.  ?

## 2020-07-02 NOTE — ED Triage Notes (Signed)
Pt does not remember going to sleep outside motel. Reports last taking his lithium 3 days ago. Speech very pressured. Facial skin ruddy and lips clearly dry.

## 2020-07-07 LAB — CULTURE, BLOOD (ROUTINE X 2)
Culture: NO GROWTH
Culture: NO GROWTH

## 2020-07-09 ENCOUNTER — Encounter (HOSPITAL_COMMUNITY): Payer: Self-pay

## 2020-07-09 ENCOUNTER — Emergency Department (HOSPITAL_COMMUNITY)
Admission: EM | Admit: 2020-07-09 | Discharge: 2020-07-09 | Disposition: A | Discharge status: Home / Self Care | Payer: Medicaid Other | Attending: Physician Assistant | Admitting: Physician Assistant

## 2020-07-09 DIAGNOSIS — Z046 Encounter for general psychiatric examination, requested by authority: Secondary | ICD-10-CM | POA: Insufficient documentation

## 2020-07-09 DIAGNOSIS — Z5321 Procedure and treatment not carried out due to patient leaving prior to being seen by health care provider: Secondary | ICD-10-CM | POA: Insufficient documentation

## 2020-07-09 LAB — COMPREHENSIVE METABOLIC PANEL
ALT: 16 U/L (ref 0–44)
AST: 21 U/L (ref 15–41)
Albumin: 4.1 g/dL (ref 3.5–5.0)
Alkaline Phosphatase: 58 U/L (ref 38–126)
Anion gap: 9 (ref 5–15)
BUN: 9 mg/dL (ref 8–23)
CO2: 25 mmol/L (ref 22–32)
Calcium: 9.6 mg/dL (ref 8.9–10.3)
Chloride: 104 mmol/L (ref 98–111)
Creatinine, Ser: 0.74 mg/dL (ref 0.61–1.24)
GFR, Estimated: >60 mL/min (ref >60)
Glucose, Bld: 115 mg/dL — ABNORMAL HIGH (ref 70–99)
Potassium: 3.7 mmol/L (ref 3.5–5.1)
Sodium: 138 mmol/L (ref 135–145)
Total Bilirubin: 0.6 mg/dL (ref 0.3–1.2)
Total Protein: 7.1 g/dL (ref 6.5–8.1)

## 2020-07-09 LAB — CBC WITH DIFFERENTIAL/PLATELET
Abs Immature Granulocytes: 0.02 10*3/uL (ref 0.00–0.07)
Basophils Absolute: 0 10*3/uL (ref 0.0–0.1)
Basophils Relative: 0 %
Eosinophils Absolute: 0.2 10*3/uL (ref 0.0–0.5)
Eosinophils Relative: 3 %
HCT: 38.5 % — ABNORMAL LOW (ref 39.0–52.0)
Hemoglobin: 12.5 g/dL — ABNORMAL LOW (ref 13.0–17.0)
Immature Granulocytes: 0 %
Lymphocytes Relative: 26 %
Lymphs Abs: 1.9 10*3/uL (ref 0.7–4.0)
MCH: 30.4 pg (ref 26.0–34.0)
MCHC: 32.5 g/dL (ref 30.0–36.0)
MCV: 93.7 fL (ref 80.0–100.0)
Monocytes Absolute: 1 10*3/uL (ref 0.1–1.0)
Monocytes Relative: 14 %
Neutro Abs: 4.1 10*3/uL (ref 1.7–7.7)
Neutrophils Relative %: 57 %
Platelets: 366 10*3/uL (ref 150–400)
RBC: 4.11 MIL/uL — ABNORMAL LOW (ref 4.22–5.81)
RDW: 12.3 % (ref 11.5–15.5)
WBC: 7.2 10*3/uL (ref 4.0–10.5)
nRBC: 0 % (ref 0.0–0.2)

## 2020-07-09 LAB — ACETAMINOPHEN LEVEL: Acetaminophen (Tylenol), Serum: <10 ug/mL — ABNORMAL LOW (ref 10–30)

## 2020-07-09 LAB — SALICYLATE LEVEL: Salicylate Lvl: <7 mg/dL — ABNORMAL LOW (ref 7.0–30.0)

## 2020-07-09 LAB — ETHANOL: Alcohol, Ethyl (B): <10 mg/dL (ref <10)

## 2020-07-09 NOTE — ED Notes (Signed)
Pt was witnessed by this writer getting into cab and leaving property. Triage RN notified.

## 2020-07-09 NOTE — ED Provider Notes (Signed)
Emergency Medicine Provider Triage Evaluation Note  Andrew Chaney , a 65 y.o. male  was evaluated in triage.  Pt complains of found by [employer] at [facility], patient was showing manic behavior, requested psychiatric evaluation.  Cannot tell me if he has been taking his medications.  Will not tell me if he is experiencing SI or HI, speech is pressured.  He did admit to EMS that he wanted a ride to be closer to sheets.  Review of Systems  Positive: As above Negative: As above  Physical Exam  BP (!) 154/83 (BP Location: Right Arm)   Pulse (!) 104   Temp 99.1 F (37.3 C) (Oral)   Resp 18   SpO2 95%  Gen:   Awake, no distress   Resp:  Normal effort  MSK:   Moves extremities without difficulty  Other:  Speech rapid, pressured Medical Decision Making  Medically screening exam initiated at 8:46 PM.  Appropriate orders placed.  Andrew Chaney was informed that the remainder of the evaluation will be completed by another provider, this initial triage assessment does not replace that evaluation, and the importance of remaining in the ED until their evaluation is complete.     Andrew Balding, PA-C 07/18/20 1536    Andrew Morgan, MD 07/18/20 2136

## 2020-07-09 NOTE — ED Triage Notes (Addendum)
Patient arrived via [facility] from [place] with [employer] present.    Patient requesting psych evaluation.   Manic   Patient reports he wanted a ride here to be closer to [employer]

## 2020-07-10 ENCOUNTER — Ambulatory Visit: Payer: Medicaid Other | Admitting: Pharmacist

## 2020-07-10 ENCOUNTER — Encounter: Payer: Self-pay | Admitting: Orthopedic Surgery

## 2020-07-10 ENCOUNTER — Telehealth: Payer: Self-pay

## 2020-07-10 NOTE — Telephone Encounter (Signed)
[facility] Patient Advocate Encounter  Patient's medications have been couriered to [facility] from [facility] and will be picked up 07/10/20.  Ileene Patrick , [facility] Patient [facility] Phone: [phone] Fax:  [phone]

## 2020-07-15 ENCOUNTER — Encounter (HOSPITAL_COMMUNITY): Payer: Self-pay

## 2020-07-15 ENCOUNTER — Telehealth: Payer: Self-pay | Admitting: Pharmacist

## 2020-07-15 ENCOUNTER — Other Ambulatory Visit: Payer: Self-pay

## 2020-07-15 ENCOUNTER — Emergency Department (HOSPITAL_COMMUNITY)
Admission: EM | Admit: 2020-07-15 | Discharge: 2020-07-15 | Disposition: A | Discharge status: Home / Self Care | Payer: Medicaid Other | Attending: Emergency Medicine | Admitting: Emergency Medicine

## 2020-07-15 DIAGNOSIS — T24311A Burn of third degree of right thigh, initial encounter: Secondary | ICD-10-CM | POA: Insufficient documentation

## 2020-07-15 DIAGNOSIS — Z4800 Encounter for change or removal of nonsurgical wound dressing: Secondary | ICD-10-CM | POA: Insufficient documentation

## 2020-07-15 DIAGNOSIS — Z8547 Personal history of malignant neoplasm of testis: Secondary | ICD-10-CM | POA: Insufficient documentation

## 2020-07-15 DIAGNOSIS — F17210 Nicotine dependence, cigarettes, uncomplicated: Secondary | ICD-10-CM | POA: Insufficient documentation

## 2020-07-15 DIAGNOSIS — X088XXA Exposure to other specified smoke, fire and flames, initial encounter: Secondary | ICD-10-CM | POA: Insufficient documentation

## 2020-07-15 DIAGNOSIS — Z5189 Encounter for other specified aftercare: Secondary | ICD-10-CM

## 2020-07-15 MED ORDER — SILVER SULFADIAZINE 1 % EX CREA
TOPICAL_CREAM | Freq: Once | CUTANEOUS | Status: AC
  Administered 2020-07-15: 1 via TOPICAL
  Filled 2020-07-15: qty 50

## 2020-07-15 MED ORDER — SULFAMETHOXAZOLE-TRIMETHOPRIM 800-160 MG PO TABS
1.0000 | ORAL_TABLET | Freq: Two times a day (BID) | ORAL | 0 refills | Status: DC
  Filled 2020-07-15: qty 14, 7d supply, fill #0

## 2020-07-15 NOTE — Discharge Instructions (Addendum)
Continue to apply Silvadene cream to the wound daily and apply dressing daily to decrease risk of infection.  Follow-up with wound care center as needed.  Take antibiotic as prescribed.

## 2020-07-15 NOTE — ED Notes (Signed)
Pt states he is leaving that he has been waiting to long. Pt seen leaving his room in his wheelchair.

## 2020-07-15 NOTE — Telephone Encounter (Signed)
Patient missed appointment last Thursday to pick up 2nd box of [place]. We have called him a few times with no answer and no VM set up. His home phone number that is listed in epic is to the [facility].
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
## 2020-07-15 NOTE — Telephone Encounter (Signed)
Called patient regarding need to p/u second month of [place]. Unable to leave message.
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
## 2020-07-16 ENCOUNTER — Emergency Department (HOSPITAL_COMMUNITY)
Admission: EM | Admit: 2020-07-16 | Discharge: 2020-07-17 | Disposition: A | Discharge status: Home / Self Care | Payer: Medicaid Other | Attending: Emergency Medicine | Admitting: Emergency Medicine

## 2020-07-16 ENCOUNTER — Other Ambulatory Visit: Payer: Self-pay

## 2020-07-16 ENCOUNTER — Other Ambulatory Visit (HOSPITAL_COMMUNITY): Payer: Self-pay

## 2020-07-16 DIAGNOSIS — Z5321 Procedure and treatment not carried out due to patient leaving prior to being seen by health care provider: Secondary | ICD-10-CM | POA: Insufficient documentation

## 2020-07-16 DIAGNOSIS — Y92524 Gas station as the place of occurrence of the external cause: Secondary | ICD-10-CM | POA: Insufficient documentation

## 2020-07-16 DIAGNOSIS — R519 Headache, unspecified: Secondary | ICD-10-CM | POA: Insufficient documentation
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
## 2020-07-16 NOTE — Telephone Encounter (Signed)
Maybe so? He was in a facility last time I saw him.
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
## 2020-07-19 ENCOUNTER — Emergency Department (HOSPITAL_COMMUNITY)
Admission: EM | Admit: 2020-07-19 | Discharge: 2020-07-20 | Payer: Medicaid Other | Attending: Emergency Medicine | Admitting: Emergency Medicine

## 2020-07-19 ENCOUNTER — Other Ambulatory Visit: Payer: Self-pay

## 2020-07-19 DIAGNOSIS — Z859 Personal history of malignant neoplasm, unspecified: Secondary | ICD-10-CM | POA: Insufficient documentation

## 2020-07-19 DIAGNOSIS — F17210 Nicotine dependence, cigarettes, uncomplicated: Secondary | ICD-10-CM | POA: Insufficient documentation

## 2020-07-19 DIAGNOSIS — L03115 Cellulitis of right lower limb: Secondary | ICD-10-CM
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
## 2020-07-20 LAB — COMPREHENSIVE METABOLIC PANEL
ALT: 14 U/L (ref 0–44)
AST: 22 U/L (ref 15–41)
Albumin: 3.7 g/dL (ref 3.5–5.0)
Alkaline Phosphatase: 49 U/L (ref 38–126)
Anion gap: 13 (ref 5–15)
BUN: 14 mg/dL (ref 8–23)
CO2: 22 mmol/L (ref 22–32)
Calcium: 9.9 mg/dL (ref 8.9–10.3)
Chloride: 102 mmol/L (ref 98–111)
Creatinine, Ser: 0.91 mg/dL (ref 0.61–1.24)
GFR, Estimated: >60 mL/min (ref >60)
Glucose, Bld: 109 mg/dL — ABNORMAL HIGH (ref 70–99)
Potassium: 3.6 mmol/L (ref 3.5–5.1)
Sodium: 137 mmol/L (ref 135–145)
Total Bilirubin: 0.8 mg/dL (ref 0.3–1.2)
Total Protein: 6.6 g/dL (ref 6.5–8.1)

## 2020-07-20 LAB — CBC WITH DIFFERENTIAL/PLATELET
Abs Immature Granulocytes: 0.03 10*3/uL (ref 0.00–0.07)
Basophils Absolute: 0 10*3/uL (ref 0.0–0.1)
Basophils Relative: 1 %
Eosinophils Absolute: 0.1 10*3/uL (ref 0.0–0.5)
Eosinophils Relative: 1 %
HCT: 39.2 % (ref 39.0–52.0)
Hemoglobin: 12.6 g/dL — ABNORMAL LOW (ref 13.0–17.0)
Immature Granulocytes: 0 %
Lymphocytes Relative: 20 %
Lymphs Abs: 1.5 10*3/uL (ref 0.7–4.0)
MCH: 30.1 pg (ref 26.0–34.0)
MCHC: 32.1 g/dL (ref 30.0–36.0)
MCV: 93.6 fL (ref 80.0–100.0)
Monocytes Absolute: 0.9 10*3/uL (ref 0.1–1.0)
Monocytes Relative: 11 %
Neutro Abs: 5.1 10*3/uL (ref 1.7–7.7)
Neutrophils Relative %: 67 %
Platelets: 340 10*3/uL (ref 150–400)
RBC: 4.19 MIL/uL — ABNORMAL LOW (ref 4.22–5.81)
RDW: 12.3 % (ref 11.5–15.5)
WBC: 7.7 10*3/uL (ref 4.0–10.5)
nRBC: 0 % (ref 0.0–0.2)

## 2020-07-20 MED ORDER — BACITRACIN ZINC 500 UNIT/GM EX OINT: TOPICAL_OINTMENT | Freq: Two times a day (BID) | CUTANEOUS | Status: DC

## 2020-07-20 MED ORDER — BACITRACIN ZINC 500 UNIT/GM EX OINT: 1.0000 "application " | TOPICAL_OINTMENT | Freq: Two times a day (BID) | CUTANEOUS | 0 refills | Status: AC

## 2020-07-20 MED ORDER — CEPHALEXIN 500 MG PO CAPS: 500.0000 mg | ORAL_CAPSULE | Freq: Four times a day (QID) | ORAL | 0 refills | Status: AC

## 2020-07-20 MED ORDER — SULFAMETHOXAZOLE-TRIMETHOPRIM 800-160 MG PO TABS: 1.0000 | ORAL_TABLET | Freq: Two times a day (BID) | ORAL | 0 refills | Status: AC

## 2020-07-20 MED ORDER — VANCOMYCIN HCL 1500 MG/300ML IV SOLN
1500.0000 mg | Freq: Once | INTRAVENOUS | Status: AC
  Administered 2020-07-20: 1500 mg via INTRAVENOUS
  Filled 2020-07-20: qty 300
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
## 2020-07-20 NOTE — ED Notes (Addendum)
Pt became increasingly agitated, constant stream of profanities, aggressive erratic movements preventing further care despite staff efforts to deescalate and reorient patient who repeatedly stated "I am [name]." Pt antibiotics completed, IV was removed, and patient was removed from room with the assistance of security and in police custody.

## 2020-07-25 LAB — CULTURE, BLOOD (ROUTINE X 2)
Culture: NO GROWTH
Culture: NO GROWTH
Special Requests: ADEQUATE

## 2020-07-29 ENCOUNTER — Encounter (HOSPITAL_COMMUNITY): Payer: Self-pay | Admitting: Emergency Medicine

## 2020-07-29 ENCOUNTER — Emergency Department (HOSPITAL_COMMUNITY)
Admission: EM | Admit: 2020-07-29 | Discharge: 2020-07-29 | Disposition: A | Discharge status: Home / Self Care | Payer: Medicaid Other | Attending: Physician Assistant | Admitting: Physician Assistant

## 2020-07-29 ENCOUNTER — Emergency Department (HOSPITAL_COMMUNITY)
Admission: EM | Admit: 2020-07-29 | Discharge: 2020-07-29 | Discharge status: Against Medical Advice | Payer: Medicaid Other

## 2020-07-29 DIAGNOSIS — Z5321 Procedure and treatment not carried out due to patient leaving prior to being seen by health care provider: Secondary | ICD-10-CM | POA: Insufficient documentation

## 2020-07-29 DIAGNOSIS — E860 Dehydration: Secondary | ICD-10-CM | POA: Insufficient documentation

## 2020-07-29 LAB — CBC WITH DIFFERENTIAL/PLATELET
Abs Immature Granulocytes: 0.03 10*3/uL (ref 0.00–0.07)
Basophils Absolute: 0 10*3/uL (ref 0.0–0.1)
Basophils Relative: 1 %
Eosinophils Absolute: 0.1 10*3/uL (ref 0.0–0.5)
Eosinophils Relative: 1 %
HCT: 38.8 % — ABNORMAL LOW (ref 39.0–52.0)
Hemoglobin: 12.6 g/dL — ABNORMAL LOW (ref 13.0–17.0)
Immature Granulocytes: 0 %
Lymphocytes Relative: 20 %
Lymphs Abs: 1.3 10*3/uL (ref 0.7–4.0)
MCH: 29.7 pg (ref 26.0–34.0)
MCHC: 32.5 g/dL (ref 30.0–36.0)
MCV: 91.5 fL (ref 80.0–100.0)
Monocytes Absolute: 0.8 10*3/uL (ref 0.1–1.0)
Monocytes Relative: 13 %
Neutro Abs: 4.4 10*3/uL (ref 1.7–7.7)
Neutrophils Relative %: 65 %
Platelets: 274 10*3/uL (ref 150–400)
RBC: 4.24 MIL/uL (ref 4.22–5.81)
RDW: 12.7 % (ref 11.5–15.5)
WBC: 6.7 10*3/uL (ref 4.0–10.5)
nRBC: 0 % (ref 0.0–0.2)

## 2020-07-29 LAB — LACTIC ACID, PLASMA: Lactic Acid, Venous: 1.1 mmol/L (ref 0.5–1.9)

## 2020-07-29 LAB — BASIC METABOLIC PANEL
Anion gap: 9 (ref 5–15)
BUN: 11 mg/dL (ref 8–23)
CO2: 24 mmol/L (ref 22–32)
Calcium: 9.9 mg/dL (ref 8.9–10.3)
Chloride: 104 mmol/L (ref 98–111)
Creatinine, Ser: 0.79 mg/dL (ref 0.61–1.24)
GFR, Estimated: >60 mL/min (ref >60)
Glucose, Bld: 117 mg/dL — ABNORMAL HIGH (ref 70–99)
Potassium: 3.8 mmol/L (ref 3.5–5.1)
Sodium: 137 mmol/L (ref 135–145)
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
## 2020-07-29 NOTE — ED Notes (Signed)
Patient left in a car .security came to let me know

## 2020-07-29 NOTE — ED Triage Notes (Signed)
Patient arrived with EMS from street reports feeling dehydrated this evening . Respirations unlabored /denies pain .

## 2020-07-30 ENCOUNTER — Other Ambulatory Visit: Payer: Self-pay

## 2020-07-30 DIAGNOSIS — Y900 Blood alcohol level of less than 20 mg/100 ml: Secondary | ICD-10-CM | POA: Insufficient documentation

## 2020-07-30 DIAGNOSIS — Z85828 Personal history of other malignant neoplasm of skin: Secondary | ICD-10-CM | POA: Insufficient documentation

## 2020-07-30 DIAGNOSIS — Z046 Encounter for general psychiatric examination, requested by authority: Secondary | ICD-10-CM | POA: Insufficient documentation

## 2020-07-30 DIAGNOSIS — F17210 Nicotine dependence, cigarettes, uncomplicated: Secondary | ICD-10-CM | POA: Insufficient documentation

## 2020-07-30 DIAGNOSIS — F1220 Cannabis dependence, uncomplicated: Secondary | ICD-10-CM | POA: Insufficient documentation

## 2020-07-30 DIAGNOSIS — F22 Delusional disorders: Secondary | ICD-10-CM | POA: Insufficient documentation

## 2020-07-30 DIAGNOSIS — M79651 Pain in right thigh: Secondary | ICD-10-CM | POA: Insufficient documentation

## 2020-07-30 DIAGNOSIS — F309 Manic episode, unspecified: Secondary | ICD-10-CM | POA: Insufficient documentation

## 2020-07-30 DIAGNOSIS — F1020 Alcohol dependence, uncomplicated: Secondary | ICD-10-CM | POA: Insufficient documentation

## 2020-07-30 DIAGNOSIS — R456 Violent behavior: Secondary | ICD-10-CM | POA: Insufficient documentation

## 2020-07-31 ENCOUNTER — Emergency Department (HOSPITAL_COMMUNITY)
Admission: EM | Admit: 2020-07-31 | Discharge: 2020-08-01 | Disposition: A | Discharge status: Home / Self Care | Payer: Medicaid Other | Attending: Emergency Medicine | Admitting: Emergency Medicine

## 2020-07-31 DIAGNOSIS — F23 Brief psychotic disorder: Secondary | ICD-10-CM

## 2020-07-31 DIAGNOSIS — F1020 Alcohol dependence, uncomplicated: Secondary | ICD-10-CM | POA: Insufficient documentation

## 2020-07-31 DIAGNOSIS — F1994 Other psychoactive substance use, unspecified with psychoactive substance-induced mood disorder: Secondary | ICD-10-CM

## 2020-07-31 DIAGNOSIS — F319 Bipolar disorder, unspecified: Secondary | ICD-10-CM

## 2020-07-31 DIAGNOSIS — L03115 Cellulitis of right lower limb: Secondary | ICD-10-CM

## 2020-07-31 LAB — URINALYSIS, ROUTINE W REFLEX MICROSCOPIC
Glucose, UA: NEGATIVE mg/dL
Hgb urine dipstick: NEGATIVE
Ketones, ur: 40 mg/dL — AB
Leukocytes,Ua: NEGATIVE
Nitrite: NEGATIVE
Protein, ur: NEGATIVE mg/dL
Specific Gravity, Urine: 1.02 (ref 1.005–1.030)
pH: 6.5 (ref 5.0–8.0)

## 2020-07-31 LAB — CBC WITH DIFFERENTIAL/PLATELET
Abs Immature Granulocytes: 0.01 10*3/uL (ref 0.00–0.07)
Basophils Absolute: 0 10*3/uL (ref 0.0–0.1)
Basophils Relative: 1 %
Eosinophils Absolute: 0.1 10*3/uL (ref 0.0–0.5)
Eosinophils Relative: 3 %
HCT: 39.1 % (ref 39.0–52.0)
Hemoglobin: 12.4 g/dL — ABNORMAL LOW (ref 13.0–17.0)
Immature Granulocytes: 0 %
Lymphocytes Relative: 27 %
Lymphs Abs: 1.3 10*3/uL (ref 0.7–4.0)
MCH: 29.5 pg (ref 26.0–34.0)
MCHC: 31.7 g/dL (ref 30.0–36.0)
MCV: 92.9 fL (ref 80.0–100.0)
Monocytes Absolute: 0.5 10*3/uL (ref 0.1–1.0)
Monocytes Relative: 10 %
Neutro Abs: 2.9 10*3/uL (ref 1.7–7.7)
Neutrophils Relative %: 59 %
Platelets: 274 10*3/uL (ref 150–400)
RBC: 4.21 MIL/uL — ABNORMAL LOW (ref 4.22–5.81)
RDW: 12.9 % (ref 11.5–15.5)
WBC: 4.8 10*3/uL (ref 4.0–10.5)
nRBC: 0 % (ref 0.0–0.2)

## 2020-07-31 LAB — ETHANOL: Alcohol, Ethyl (B): <10 mg/dL (ref <10)

## 2020-07-31 LAB — RAPID URINE DRUG SCREEN, HOSP PERFORMED
Amphetamines: NOT DETECTED
Barbiturates: NOT DETECTED
Benzodiazepines: NOT DETECTED
Cocaine: NOT DETECTED
Opiates: NOT DETECTED
Tetrahydrocannabinol: POSITIVE — AB

## 2020-07-31 LAB — COMPREHENSIVE METABOLIC PANEL
ALT: 14 U/L (ref 0–44)
AST: 20 U/L (ref 15–41)
Albumin: 3.9 g/dL (ref 3.5–5.0)
Alkaline Phosphatase: 51 U/L (ref 38–126)
Anion gap: 15 (ref 5–15)
BUN: 14 mg/dL (ref 8–23)
CO2: 24 mmol/L (ref 22–32)
Calcium: 10.1 mg/dL (ref 8.9–10.3)
Chloride: 103 mmol/L (ref 98–111)
Creatinine, Ser: 0.52 mg/dL — ABNORMAL LOW (ref 0.61–1.24)
GFR, Estimated: >60 mL/min (ref >60)
Glucose, Bld: 94 mg/dL (ref 70–99)
Potassium: 3.2 mmol/L — ABNORMAL LOW (ref 3.5–5.1)
Sodium: 142 mmol/L (ref 135–145)
Total Bilirubin: 0.7 mg/dL (ref 0.3–1.2)
Total Protein: 7.1 g/dL (ref 6.5–8.1)

## 2020-07-31 MED ORDER — ZIPRASIDONE MESYLATE 20 MG IM SOLR
20.0000 mg | Freq: Once | INTRAMUSCULAR | Status: AC
  Administered 2020-07-31: 20 mg via INTRAMUSCULAR
  Filled 2020-07-31: qty 20

## 2020-07-31 MED ORDER — DOXYCYCLINE HYCLATE 100 MG PO TABS
100.0000 mg | ORAL_TABLET | Freq: Once | ORAL | Status: AC
  Administered 2020-07-31: 100 mg via ORAL
  Filled 2020-07-31 (×2): qty 1

## 2020-07-31 MED ORDER — LORAZEPAM 1 MG PO TABS
2.0000 mg | ORAL_TABLET | Freq: Once | ORAL | Status: AC | PRN
  Administered 2020-07-31: 2 mg via ORAL
  Filled 2020-07-31: qty 2

## 2020-07-31 MED ORDER — STERILE WATER FOR INJECTION IJ SOLN
INTRAMUSCULAR | Status: AC
  Filled 2020-07-31: qty 10

## 2020-07-31 NOTE — Progress Notes (Signed)
Patient meets inpatient criteria per Pecolia Ades, NP. Patient referred to the following geriatric psych facilities:  [facility] Provider Address Phone [facility]  [address] [place] [address] [place]  [facility]  [address] [place] [address] [phone]  [facility]  [address] [place] [address] [phone] [place]  [address] [address] [place] [address] [phone] [facility]  [address] [place] [address] [phone]  [facility]  [address] [place] [address] [phone] [place]  [address] [place] [address] [phone] [facility]  [address], [place] [address] [phone] [facility]  [place], [place] [address] [phone] [facility]  [address] [place] [address] [phone]    CSW will continue to monitor disposition.    Signed:  Durenda Hurt, MSW, [place], LCASA 07/31/2020 8:58 PM
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
## 2020-07-31 NOTE — ED Notes (Signed)
Patient escorted to restroom by Air cabin crew, will collect vital signs when patient returns to room.

## 2020-07-31 NOTE — ED Notes (Signed)
Pt belongings in bag with label in locker 29

## 2020-07-31 NOTE — ED Notes (Addendum)
Pt escorted back inside by security, IM geodon given.  Pt became very loud, violent, & aggressive with Surveyor, mining.  Pt picked up chair in room and attempted to throw it.  Writer removed all cords from room, chair, cpr stool, trash can, so pt can't grab them and throw them. Pt yelling at Conservation officer, nature, using profanity and told all staff to get out of his room. Pt refused to take po antibiotic. Pt refusing vitals.  Security removed pt's cup, hat, and electronic device and placed in belonging bag.  Pt too aggressive to remove clothing or attempt blood draw at this time.

## 2020-07-31 NOTE — ED Notes (Signed)
Patient refused vital signs from this Probation officer. Will attempt to collect VS at a later time.

## 2020-07-31 NOTE — ED Triage Notes (Signed)
Pt homeless, arrives to ER for right leg wound.  Vitals stable.  Pt a&ox4, bugs seen crawling on pt.  Pt yelling in waiting room at patients and staff, now yelling for staff to turn on the TV.  Security called in waiting room prior to triage.

## 2020-07-31 NOTE — ED Provider Notes (Signed)
[facility] DEPT Provider Note   CSN: [id number] Arrival date & time: 07/30/20  2103     History Chief Complaint  Patient presents with   Leg Pain    Andrew Chaney is a 65 y.o. male.  Patient is a 65 year old male with history of bipolar disorder, status post left below the knee amputation, IV drug abuse, homelessness.  Patient presenting with a wound to his right thigh.  This has been present for several weeks since he burned himself with a cigarette.  He has been seen on several occasions with this complaint.  He was prescribed Bactrim at one point, but it is unclear as to whether or not he filled this prescription.  He denies any fevers or chills.  The history is provided by the patient.  Leg Pain Lower extremity pain location: Right thigh. 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[place]    Renal disorder     Patient Active Problem List   Diagnosis Date Noted   S/P BKA (below knee amputation) unilateral, left ([place])    Protein-calorie malnutrition, severe 04/25/2020   Left foot pain    IVDU (intravenous drug user)    Acute osteomyelitis of left calcaneus ([place])    Cutaneous abscess of left foot    Subacute osteomyelitis, left ankle and foot ([place]) 04/15/2020   Sepsis due to skin infection ([place]) 04/15/2020   Insomnia 03/23/2020   Dupuytren's contracture 08/05/2019   MRSA bacteremia 07/31/2019   Altered mental status    Fever 07/30/2019   Non-purulent Cellulitis of right upper arm 07/30/2019   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[place]) 07/29/2019   Seizure ([place]) 07/26/2019   Status epilepticus ([place]) 07/26/2019   Psychosis ([place]) 09/05/2018   Bipolar disorder, most recent episode manic ([place]) 04/11/2018   Bipolar I disorder, current or most  recent episode manic, with psychotic features ([place]) 04/08/2018   Confusion 01/03/2015   Chronic hepatitis C ([place]) 12/20/2012   Renal failure 12/17/2012   Acute encephalopathy 12/17/2012   ARF (acute renal failure) ([place]) 12/17/2012   Hyponatremia 12/17/2012   CAP (community acquired pneumonia) 12/17/2012   Rhabdomyolysis 12/17/2012   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 ENDOSCOPY;  Service: Cardiovascular;  Laterality: N/A;   TEE WITHOUT CARDIOVERSION N/A 04/21/2020   Procedure: TRANSESOPHAGEAL ECHOCARDIOGRAM (TEE);  Surgeon: Buford Dresser, MD;  Location: [facility] ENDOSCOPY;  Service: Cardiovascular;  Laterality: N/A;       Family History  Problem Relation Age of Onset   Dementia Mother     Social History   Tobacco Use   Smoking status: Every Day    Packs/day: 0.50    Types: Cigarettes   Smokeless tobacco: Never  Vaping Use   Vaping Use: Never used  Substance Use Topics   Alcohol use: Yes   Drug use: Yes    Types: Marijuana    Comment: States no longer uses  marijuana because he is "on probation"    Home Medications Prior to Admission medications   Medication Sig Start Date End Date Taking? Authorizing Provider  acetaminophen (TYLENOL) 500 MG tablet Take 1 tablet (500 mg total) by mouth every 8 (eight) hours as needed for moderate pain. 05/09/20 05/09/21  Thurnell Lose, MD  ascorbic acid (VITAMIN C) 1000 MG tablet Take 1 tablet (1,000 mg total) by mouth daily. 05/10/20   Thurnell Lose, MD  bacitracin ointment Apply 1 application topically 2 (two) times daily. 07/20/20   Mesner, Corene Cornea, MD  cephALEXin (KEFLEX)  500 MG capsule Take 1 capsule (500 mg total) by mouth 4 (four) times daily. 07/20/20   Mesner, Corene Cornea, MD  cyanocobalamin (,VITAMIN B-12,) 1000 MCG/ML injection Inject 1 mL (1,000 mcg total) into the skin every 30 (thirty) days. 05/09/20   Thurnell Lose, MD  folic acid (FOLVITE) 1 MG tablet Take 1 tablet (1 mg total) by mouth daily. 05/09/20   Thurnell Lose, MD  Glecaprevir-Pibrentasvir (MAVYRET) 100-40 MG TABS Take 3 tablets by mouth daily with breakfast. 06/12/20   Kuppelweiser, Cassie L, RPH-CPP  ibuprofen (ADVIL) 400 MG tablet Take 1 tablet (400 mg total) by mouth every 6 (six) hours as needed for headache or mild pain. 05/09/20   Thurnell Lose, MD  levETIRAcetam (KEPPRA) 500 MG tablet Take 1 tablet (500 mg total) by mouth 2 (two) times daily. 05/09/20 06/08/20  Thurnell Lose, MD  lithium carbonate 300 MG capsule Take 1 capsule (300 mg total) by mouth 2 (two) times daily with a meal. 05/09/20   Thurnell Lose, MD  pantoprazole (PROTONIX) 40 MG tablet Take 1 tablet (40 mg total) by mouth daily. 05/10/20   Thurnell Lose, MD  pregabalin (LYRICA) 150 MG capsule Take 1 capsule (150 mg total) by mouth 3 (three) times daily. 05/09/20   Thurnell Lose, MD  thiamine 100 MG tablet Take 2.5 tablets (250 mg total) by mouth daily. 05/10/20   Thurnell Lose, MD    Allergies    Patient has no known allergies.  Review of Systems   Review of Systems  All other systems reviewed and are negative.  Physical Exam Updated Vital Signs BP (!) 145/100 (BP Location: Left Arm)   Pulse 76   Temp 98.4 F (36.9 C)   Resp 16   Ht 6' (1.829 m)   Wt 74.8 kg   SpO2 93%   BMI 22.38 kg/m   Physical Exam Vitals and nursing note reviewed.  Constitutional:      General: He is not in acute distress.    Appearance: Normal appearance. He is not ill-appearing.  HENT:     Head: Normocephalic and atraumatic.  Pulmonary:     Effort: Pulmonary effort is normal.  Skin:    General: Skin is warm and dry.      Comments: There is an area of scar tissue and surrounding erythema to the right thigh.  See photo below.  Neurological:     Mental Status: He is alert.  Psychiatric:        Speech: Speech is rapid and pressured and tangential.        Behavior: Behavior is hyperactive.        Thought Content: Thought content is delusional.     Comments: Patient's speech is rambling and at times incoherent.  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  He is continuously talking from topic to topic and talking to himself  in the room.        ED Results / Procedures / Treatments   Labs (all labs ordered are listed, but only abnormal results are displayed) Labs Reviewed - No data to display  EKG None  Radiology No results found.  Procedures Procedures   Medications Ordered in ED Medications  doxycycline (VIBRA-TABS) tablet 100 mg (has no administration in time range)    ED Course  I have reviewed the triage vital signs and the nursing notes.  Pertinent labs & imaging results that were available during my care of the patient were reviewed by me and considered in my medical decision making (see chart for details).    MDM Rules/Calculators/A&P  Patient is a 65 year old male with history of bipolar disorder presenting with complaints of wound to his right thigh.  He apparently burned himself with a cigarette several weeks ago and this now appears somewhat infected.  He was prescribed Bactrim but is unclear as to whether or not he filled this medication.  Patient will be given doxycycline here in the ER.  While in the ER, patient has been very manic.  He is constantly talking to himself and telling me that he is God, has billions of dollars, and is the owner of many corporations including [employer].  He has been very disruptive in the waiting room.  Due to the patient's obvious psychosis, he has been given Geodon and I have initiated IVC.  Patient to be evaluated by  TTS.  Final Clinical Impression(s) / ED Diagnoses Final diagnoses:  None    Rx / DC Orders ED Discharge Orders     None        Veryl Speak, MD 07/31/20 [phone]

## 2020-07-31 NOTE — BH Assessment (Signed)
Comprehensive Clinical Assessment (CCA) Note  07/31/2020 Andrew Chaney [id number]  Disposition:  Per Pecolia Ades, NP, Geriatric Inpatient is recommended  The patient demonstrates the following risk factors for suicide: Chronic risk factors for suicide include: psychiatric disorder of bipolar disorder, substance use disorder, medical illness recent leg amputation, and demographic factors (male, >63 y/o). Acute risk factors for suicide include: family or marital conflict, unemployment, and social withdrawal/isolation. Protective factors for this patient include:  none . Considering these factors, the overall suicide risk at this point appears to be high. Patient is not appropriate for outpatient follow up.   AUDIT    Flowsheet Row Admission (Discharged) from 04/11/2018 in [facility]  Alcohol Use Disorder Identification Test Final Score (AUDIT) 0      PHQ2-9    Flowsheet Row ED from 07/31/2020 in [facility] DEPT  PHQ-2 Total Score 6  PHQ-9 Total Score 27      Flowsheet Row ED from 07/31/2020 in [facility] DEPT ED from 07/29/2020 in [facility] ED from 07/19/2020 in [facility] No Risk No Risk No Risk        Chief Complaint:  Chief Complaint  Patient presents with   Leg Pain   IVC   Manic Behavior   Aggressive Behavior   Visit Diagnosis: F31.52 Bipolar Disorder    F10.20 Alcohol Use Disorder Severe,        F12.20 Cannabis Use Disorder Severe    CCA Screening, Triage and Referral (STR)  Patient Reported Information How did you hear about [place]? Self  What Is the Reason for Your Visit/Call Today? Per ED Report: 65 year old male with history of bipolar disorder, status post left below the knee amputation, IV  drug abuse, homelessness. Patient presenting with a wound to his right thigh. This has  been  present for several weeks since he burned himself with a cigarette. He has been seen on several  occasions with this complaint. Pt wandered outside while Probation officer in another room. Pt found outside  smoking and stated he had whisky in his cup. Security called and arrived outside where patient  found smoking. Pt escorted back inside by security, IM geodon given. Pt became very loud, violent,  & aggressive with Surveyor, mining. Pt picked up chair in room and attempted to throw it.   Writer removed all cords from room, chair, cpr stool, trash can, so pt can't grab them and throw  them. Pt yelling at Conservation officer, nature, using profanity and told all staff to get out of his room. Pt  refused to take po antibiotic. Pt refusing vitals.   TTS attempted to assess patient, but he was not very cooperative and he was somewhat irritable.  Patient did grant permission for TTS to contact his family for information.  TTS spoke to [employer] [phone], patient's sister-in-law, who states that she talked him earlier today and could tell he was manic and delusional.  She states that he told her he was going to [place] to see his best Andrew Chaney.  She states that patient has been diagnosed with bipolar disorder and has never been compliant with medications and he abuses alcohol and drugs.  She states that he was diagnosed as a teenager and has lived with various family members at times, but when he is off his medication, manic and using drugs, no one can manage him. She states  that he has been in and out of jail and prison, state mental hospitals and has chosen to be homeless since 1993.  She states that his leg was amputated in the Spring due to an infection and she thought it might change him, but she states that he has figured out that he can park his wheelchair on [address] and people gives him money which he uses to buy alcohol.  She states that he has no motivation at all right now to try to help himself.  She  states that he has made threats to kill himself in the past or statements that he wished he was dead, but she states that he has never attempted suicide that she knows of.  Patient denies SI/HI.  Admits to drinking alcohol and marijuana daily.  How Long Has This Been Causing You Problems? > than 6 months  What Do You Feel Would Help You the Most Today? Treatment for Depression or other mood problem; Alcohol or Drug Use Treatment   Have You Recently Had Any Thoughts About Hurting Yourself? No  Are You Planning to Commit Suicide/Harm Yourself At This time? No   Have you Recently Had Thoughts About [place]? No  Are You Planning to Harm Someone at This Time? No  Explanation: No data recorded  Have You Used Any Alcohol or Drugs in the Past 24 Hours? Yes  How Long Ago Did You Use Drugs or Alcohol? No data recorded What Did You Use and How Much? alcohol and marijuana, an unknown amount   Do You Currently Have a Therapist/Psychiatrist? No  Name of Therapist/Psychiatrist: No data recorded  Have You Been Recently Discharged From Any Office Practice or Programs? No  Explanation of Discharge From Practice/Program: No data recorded    CCA Screening Triage Referral Assessment Type of Contact: Tele-Assessment  Telemedicine Service Delivery:   Is this Initial or Reassessment? Initial Assessment  Date Telepsych consult ordered in [facility]:  07/31/20  Time Telepsych consult ordered in [facility]:  1800  Location of Assessment: [facility]  Provider Location: [facility]   Collateral Involvement: TTS contatcted patient's sister-in-law  Shirley Friar   Does Patient Have a Stage manager Guardian? No data recorded Name and Contact of Legal Guardian: No data recorded If Minor and Not Living with Parent(s), Who has Custody? No data recorded Is CPS involved or ever been involved? Never  Is APS involved or ever been involved? Never   Patient Determined To Be At Risk  for Harm To Self or Others Based on Review of Patient Reported Information or Presenting Complaint? No  Method: No data recorded Availability of Means: No data recorded Intent: No data recorded Notification Required: No data recorded Additional Information for Danger to Others Potential: No data recorded Additional Comments for Danger to Others Potential: No data recorded Are There Guns or Other Weapons in Your Home? No data recorded Types of Guns/Weapons: No data recorded Are These Weapons Safely Secured?                            No data recorded Who Could Verify You Are Able To Have These Secured: No data recorded Do You Have any Outstanding Charges, Pending Court Dates, Parole/Probation? No data recorded Contacted To Inform of Risk of Harm To Self or Others: No data recorded   Does Patient Present under Involuntary Commitment? Yes  IVC Papers Initial File Date: 07/31/20   [place] of  Residence: [place]   Patient Currently Receiving the Following Services: Not Receiving Services   Determination of Need: No data recorded  Options For Referral: Inpatient Hospitalization     CCA Biopsychosocial Patient Reported Schizophrenia/Schizoaffective Diagnosis in Past: No   Strengths: UTA   Mental Health Symptoms Depression:   Sleep (too much or little); Irritability   Duration of Depressive symptoms:  Duration of Depressive Symptoms: Greater than two weeks   Mania:   N/A   Anxiety:    Worrying   Psychosis:   None   Duration of Psychotic symptoms:    Trauma:   None   Obsessions:   None   Compulsions:   None   Inattention:   None   Hyperactivity/Impulsivity:   N/A   Oppositional/Defiant Behaviors:   None   Emotional Irregularity:   None   Other Mood/Personality Symptoms:  No data recorded   Mental Status Exam Appearance and self-care  Stature:   Average   Weight:   Average weight   Clothing:   Disheveled; Dirty   Grooming:    Neglected   Cosmetic use:   None   Posture/gait:   Normal   Motor activity:   Not Remarkable   Sensorium  Attention:   Normal   Concentration:   Normal   Orientation:   Person; Place   Recall/memory:   Defective in Short-term; Defective in Remote   Affect and Mood  Affect:   Full Range   Mood:   Euthymic   Relating  Eye contact:   Normal   Facial expression:   Responsive   Attitude toward examiner:   Irritable   Thought and Language  Speech flow:  Pressured   Thought content:   Appropriate to Mood and Circumstances   Preoccupation:   None   Hallucinations:   None   Organization:  No data recorded  [employer] of Knowledge:   Fair   Intelligence:   Average   Abstraction:   Normal   Judgement:   Fair   Art therapist:   Adequate   Insight:   Fair   Decision Making:   Normal   Social Functioning  Social Maturity:   Responsible   Social Judgement:   "Street Smart"   Stress  Stressors:   Housing; Illness; Financial   Coping Ability:   Overwhelmed; Exhausted; Deficient supports   Skill Deficits:   None   Supports:   Support needed     Religion: Religion/Spirituality Are You 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)  Leisure/Recreation: Leisure / Recreation Do You Have Hobbies?: No  Exercise/Diet: Exercise/Diet Do You Exercise?: No Have You Gained or Lost A Significant Amount of Weight in the Past Six Months?: No Do You Follow a Special Diet?: No Do You Have Any Trouble Sleeping?: Yes Explanation of Sleeping Difficulties: patient states that he does not sleep well   CCA Employment/Education Employment/Work Situation: Employment / Work Situation Employment Situation: On disability Why is Patient on Disability: UTA How Long has Patient Been on Disability: UTA Patient's Job has Been Impacted by Current Illness: No  Education: Education Is Patient Currently Attending School?: No Last Grade  Completed: 12 Did You Attend College?: No Did You Have An Individualized Education Program (IIEP): No Did You Have Any Difficulty At School?: No Patient's Education Has Been Impacted by Current Illness: No   CCA Family/Childhood History Family and Relationship History: Family history Marital status: Single Does patient have children?: No  Childhood History:  Childhood History By  whom was/is the patient raised?: Both parents Did patient suffer any verbal/emotional/physical/sexual abuse as a child?: No Did patient suffer from severe childhood neglect?: No Has patient ever been sexually abused/assaulted/raped as an adolescent or adult?: No Witnessed domestic violence?: No Has patient been affected by domestic violence as an adult?: No  Child/Adolescent Assessment:     CCA Substance Use Alcohol/Drug Use: Alcohol / Drug Use Pain Medications: See MAR Prescriptions: see MAR Over the Counter: See MAR History of alcohol / drug use?: Yes Longest period of sobriety (when/how long): UTA Negative Consequences of Use: Financial, Legal, Personal relationships Withdrawal Symptoms: None Substance #1 Name of Substance 1: alcohol 1 - Age of First Use: teenager 1 - Amount (size/oz): 1-2 beers (patient is minimizing his use) 1 - Frequency: daily 1 - Duration: since onset 1 - Last Use / Amount: drank whiskey today 1 - Method of Aquiring: buys from stores 1- Route of Use: oral Substance #2 Name of Substance 2: marijuana 2 - Age of First Use: "as a kid" 2 - Amount (size/oz): UTA 2 - Frequency: daily 2 - Duration: ongoing 2 - Last Use / Amount: yesterday 2 - Method of Aquiring: off the street 2 - Route of Substance Use: smoke                     ASAM's:  Six Dimensions of Multidimensional Assessment  Dimension 1:  Acute Intoxication and/or Withdrawal Potential:   Dimension 1:  Description of individual's past and current experiences of substance use and withdrawal: Patient  has no current withdrawal symptoms  Dimension 2:  Biomedical Conditions and Complications:   Dimension 2:  Description of patient's biomedical conditions and  complications: Patient has a recent leg amputation due to infection from her drug use  Dimension 3:  Emotional, Behavioral, or Cognitive Conditions and Complications:  Dimension 3:  Description of emotional, behavioral, or cognitive conditions and complications: Patient has bipolar disorder and non compliant with medications  Dimension 4:  Readiness to Change:  Dimension 4:  Description of Readiness to Change criteria: Patient expresses no desire to change his behavior  Dimension 5:  Relapse, Continued use, or Continued Problem Potential:  Dimension 5:  Relapse, continued use, or continued problem potential critiera description: Patient has a history of chronic relapses  Dimension 6:  Recovery/Living Environment:  Dimension 6:  Recovery/Iiving environment criteria description: Patient is homeless and minimal support  ASAM Severity Score: ASAM's Severity Rating Score: 15  ASAM Recommended Level of Treatment: ASAM Recommended Level of Treatment: Level III Residential Treatment   Substance use Disorder (SUD) Substance Use Disorder (SUD)  Checklist Symptoms of Substance Use: Continued use despite having a persistent/recurrent physical/psychological problem caused/exacerbated by use, Continued use despite persistent or recurrent social, interpersonal problems, caused or exacerbated by use, Evidence of tolerance, Persistent desire or unsuccessful efforts to cut down or control use, Recurrent use that results in a failure to fulfill major role obligations (work, school, home), Social, occupational, recreational activities given up or reduced due to use, Repeated use in physically hazardous situations, Substance(s) often taken in larger amounts or over longer times than was intended  Recommendations for Services/Supports/Treatments: Recommendations for  Services/Supports/Treatments Recommendations For Services/Supports/Treatments: Inpatient Hospitalization  Discharge Disposition:    DSM5 Diagnoses: Patient Active Problem List   Diagnosis Date Noted   Alcohol use disorder, severe, dependence ([place])    S/P BKA (below knee amputation) unilateral, left ([place])    Protein-calorie malnutrition, severe 04/25/2020   Left foot pain  IVDU (intravenous drug user)    Acute osteomyelitis of left calcaneus ([place])    Cutaneous abscess of left foot    Subacute osteomyelitis, left ankle and foot ([place]) 04/15/2020   Sepsis due to skin infection ([place]) 04/15/2020   Insomnia 03/23/2020   Dupuytren's contracture 08/05/2019   MRSA bacteremia 07/31/2019   Altered mental status    Fever 07/30/2019   Non-purulent Cellulitis of right upper arm 07/30/2019   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[place]) 07/29/2019   Seizure ([place]) 07/26/2019   Status epilepticus ([place]) 07/26/2019   Psychosis ([place]) 09/05/2018   Affective psychosis, bipolar ([place]) 04/11/2018   Bipolar I disorder, current or most recent episode manic, with psychotic features ([place]) 04/08/2018   Confusion 01/03/2015   Chronic hepatitis C ([place]) 12/20/2012   Renal failure 12/17/2012   Acute encephalopathy 12/17/2012   ARF (acute renal failure) ([place]) 12/17/2012   Hyponatremia 12/17/2012   CAP (community acquired pneumonia) 12/17/2012   Rhabdomyolysis 12/17/2012   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

## 2020-07-31 NOTE — ED Notes (Signed)
Report called to TCU nurse  Will go to  rm 29 at 4pm  When room and nurse are ready

## 2020-07-31 NOTE — ED Notes (Signed)
Pt wandered outside while Probation officer in another room.  Pt found outside smoking and stated he had whisky in his cup.  Security called and arrived outside where patient found smoking.

## 2020-08-01 DIAGNOSIS — F1994 Other psychoactive substance use, unspecified with psychoactive substance-induced mood disorder: Secondary | ICD-10-CM

## 2020-08-01 MED ORDER — PANTOPRAZOLE SODIUM 40 MG PO TBEC
40.0000 mg | DELAYED_RELEASE_TABLET | Freq: Every day | ORAL | Status: DC
  Administered 2020-08-01: 40 mg via ORAL
  Filled 2020-08-01: qty 1

## 2020-08-01 MED ORDER — DOXYCYCLINE HYCLATE 100 MG PO CAPS: 100.0000 mg | ORAL_CAPSULE | Freq: Two times a day (BID) | ORAL | 0 refills | Status: AC

## 2020-08-01 MED ORDER — LITHIUM CARBONATE 300 MG PO CAPS
300.0000 mg | ORAL_CAPSULE | Freq: Two times a day (BID) | ORAL | Status: DC
  Administered 2020-08-01: 300 mg via ORAL
  Filled 2020-08-01: qty 1

## 2020-08-01 MED ORDER — FOLIC ACID 1 MG PO TABS
1.0000 mg | ORAL_TABLET | Freq: Every day | ORAL | Status: DC
  Administered 2020-08-01: 1 mg via ORAL
  Filled 2020-08-01: qty 1

## 2020-08-01 MED ORDER — DIVALPROEX SODIUM 500 MG PO DR TAB: 500.0000 mg | DELAYED_RELEASE_TABLET | Freq: Two times a day (BID) | ORAL | Status: DC

## 2020-08-01 MED ORDER — DIVALPROEX SODIUM 250 MG PO DR TAB: 250.0000 mg | DELAYED_RELEASE_TABLET | Freq: Two times a day (BID) | ORAL | Status: DC

## 2020-08-01 MED ORDER — THIAMINE HCL 100 MG PO TABS
250.0000 mg | ORAL_TABLET | Freq: Every day | ORAL | Status: DC
  Administered 2020-08-01: 250 mg via ORAL
  Filled 2020-08-01: qty 3

## 2020-08-01 MED ORDER — DEPAKOTE 250 MG PO TBEC: 250.0000 mg | DELAYED_RELEASE_TABLET | Freq: Two times a day (BID) | ORAL | 0 refills | Status: DC

## 2020-08-01 MED ORDER — DEPAKOTE 250 MG PO TBEC: 250.0000 mg | DELAYED_RELEASE_TABLET | Freq: Two times a day (BID) | ORAL | 0 refills | Status: AC

## 2020-08-01 MED ORDER — GLECAPREVIR-PIBRENTASVIR 100-40 MG PO TABS: 3.0000 | ORAL_TABLET | Freq: Every day | ORAL | Status: DC

## 2020-08-01 NOTE — Progress Notes (Signed)
match program letter given and explained to patient. States he has used it beofre and know what to do. Taxi from [employer] called for patient due to wheelchair bound.

## 2020-08-01 NOTE — ED Notes (Addendum)
Patient at nurse's station requesting to use the telephone to order products from infomercials that he is currently watching on TV.  He states, "I'm a billionaire.  I have a $100,000 check in my belongings."  Patient redirected back to room without incident.

## 2020-08-01 NOTE — Care Management (Signed)
Match letter given to patient with instructions for obtaining meds.

## 2020-08-01 NOTE — ED Notes (Signed)
Pt DC d off unit to home per provider. Pt alert, no s/s of distress. DC information and belongings given to pt. Pt off unit in W/C, escorted by NT. Pt transported by taxi.

## 2020-08-01 NOTE — ED Notes (Signed)
Pt alert, cooperative,  Talking to staff.  No s/s of distress.

## 2020-08-01 NOTE — Discharge Instructions (Signed)
For your behavioral health needs you are advised to follow up with [facility] at your earliest opportunity:       [facility]      [place], [place] [address]      [phone]      They offer psychiatry/medication management, therapy and substance abuse treatment.  New patients are seen in their walk-in clinic.  Walk-in hours are Monday - Thursday from 8:00 am - 11:00 am for psychiatry, and Friday from 1:00 pm - 4:00 pm for therapy.  Walk-in patients are seen on a first come, first served basis, so try to arrive as early as possible for the best chance of being seen the same day.

## 2020-08-01 NOTE — Progress Notes (Signed)
..  Transition of Care [facility]) - Emergency Department Mini Assessment   Patient Details  Name: Andrew Chaney MRN: [id number] Date of Birth: 1955-04-24  Transition of Care [facility]) CM/SW Contact:    Illene Regulus, LCSW Phone Number: 08/01/2020, 1:08 PM   Clinical Narrative:  [facility] consulted about pt, CSW Attached shelter resources to AVS, CSW consulted RNCM  Velva Harman to help with medication, she alos contact Taxi for pt upon d/c.     ED Mini Assessment:    Barriers to Discharge: Barriers Resolved     Means of departure: Taxi  Interventions which prevented an admission or readmission: Transportation Screening, Homeless Screening    Patient Contact and Communications        ,                 Admission diagnosis:  leg problem Patient Active Problem List   Diagnosis Date Noted   Substance induced mood disorder ([place]) 08/01/2020   Alcohol use disorder, severe, dependence ([place])    S/P BKA (below knee amputation) unilateral, left ([place])    Protein-calorie malnutrition, severe 04/25/2020   Left foot pain    IVDU (intravenous drug user)    Acute osteomyelitis of left calcaneus ([place])    Cutaneous abscess of left foot    Subacute osteomyelitis, left ankle and foot ([place]) 04/15/2020   Sepsis due to skin infection ([place]) 04/15/2020   Insomnia 03/23/2020   Dupuytren's contracture 08/05/2019   MRSA bacteremia 07/31/2019   Altered mental status    Fever 07/30/2019   Non-purulent Cellulitis of right upper arm 07/30/2019   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[place]) 07/29/2019   Seizure ([place]) 07/26/2019   Status epilepticus ([place]) 07/26/2019   Psychosis ([place]) 09/05/2018   Affective psychosis, bipolar ([place]) 04/11/2018   Bipolar I disorder, current or most recent episode manic, with psychotic features ([place]) 04/08/2018   Confusion 01/03/2015   Chronic hepatitis C ([place]) 12/20/2012   Renal failure 12/17/2012   Acute encephalopathy 12/17/2012   ARF  (acute renal failure) ([place]) 12/17/2012   Hyponatremia 12/17/2012   CAP (community acquired pneumonia) 12/17/2012   Rhabdomyolysis 12/17/2012   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[place]) 12/17/2012   PCP:  Patient, No Pcp Per (Inactive) Pharmacy:   [facility] [address] [place] [address] Phone: [phone] Fax: [phone]  [id number] [id number] [id number]: [phone]: [phone]

## 2020-08-01 NOTE — BH Assessment (Signed)
[place] Assessment Progress Note   Per Oneida Alar, NP, this pt does not require psychiatric hospitalization at this time.  Pt presents under IVC initiated by EDP Veryl Speak, MD which has been rescinded by Hampton Abbot, MD.  Pt is psychiatrically cleared.  Discharge instructions advise pt to follow up with [facility].  A TOC consult has been ordered to address pt's psychosocial needs.  EDP Aletta Edouard and pt's nurse, Eustaquio Maize, have been notified.  Jalene Mullet, [place] Triage Specialist [phone]
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
## 2020-08-01 NOTE — ED Notes (Signed)
Pt agitated. Uncooperative with care. Yelling out.

## 2020-08-01 NOTE — ED Notes (Signed)
Pt alert. Demanding. Agitated. Yelling at staff.  Pt wants to leave.  Pt grandiose, hyper verbal.

## 2020-08-02 ENCOUNTER — Emergency Department (HOSPITAL_COMMUNITY)
Admission: EM | Admit: 2020-08-02 | Discharge: 2020-08-02 | Disposition: A | Discharge status: Home / Self Care | Payer: Medicaid Other | Attending: Emergency Medicine | Admitting: Emergency Medicine

## 2020-08-02 ENCOUNTER — Encounter (HOSPITAL_COMMUNITY): Payer: Self-pay

## 2020-08-02 DIAGNOSIS — L989 Disorder of the skin and subcutaneous tissue, unspecified: Secondary | ICD-10-CM | POA: Insufficient documentation

## 2020-08-02 DIAGNOSIS — Z5321 Procedure and treatment not carried out due to patient leaving prior to being seen by health care provider: Secondary | ICD-10-CM | POA: Insufficient documentation

## 2020-08-02 NOTE — ED Triage Notes (Signed)
Pt arrived via EMS, found in parking lot, PD called EMS. Pt states he has bumps on skin. Has been seen recently for same. Told to follow up. Has not followed up.

## 2020-08-02 NOTE — ED Notes (Signed)
Pt. Called x3 for reassess vital with no response. Nurses aware.

## 2020-08-05 ENCOUNTER — Encounter (HOSPITAL_COMMUNITY): Payer: Self-pay | Admitting: Emergency Medicine

## 2020-08-05 ENCOUNTER — Emergency Department (HOSPITAL_COMMUNITY)
Admission: EM | Admit: 2020-08-05 | Discharge: 2020-08-05 | Disposition: A | Discharge status: Home / Self Care | Payer: Medicaid Other | Attending: Emergency Medicine | Admitting: Emergency Medicine

## 2020-08-05 DIAGNOSIS — Z046 Encounter for general psychiatric examination, requested by authority: Secondary | ICD-10-CM | POA: Insufficient documentation

## 2020-08-05 DIAGNOSIS — Z5321 Procedure and treatment not carried out due to patient leaving prior to being seen by health care provider: Secondary | ICD-10-CM | POA: Insufficient documentation

## 2020-08-05 NOTE — ED Notes (Signed)
This Probation officer was advised by screeners that this pt light a cigarette in the ED lobby. Pt told to put cigarette out that no smoking is allowed on [facility] property. Pt wheeled outside after refusing to put his cigarette out.

## 2020-08-05 NOTE — ED Notes (Signed)
patent was smoking in the lobby-was told to put out his cigarette-patient became verbally abusive-writer wheeled him outside and showed him the "no smoking sign"-asked Aaron Edelman of security to call [employer] for arrest

## 2020-08-05 NOTE — ED Triage Notes (Signed)
Per EMS-was outside [place] home-facility called [employer] for trespassing-[employer] gave him the choice of being arrested or to get help in ED-patient requested to come to ED for psych eval

## 2020-08-13 ENCOUNTER — Encounter (HOSPITAL_COMMUNITY): Payer: Self-pay

## 2020-08-13 ENCOUNTER — Other Ambulatory Visit: Payer: Self-pay

## 2020-08-13 ENCOUNTER — Emergency Department (HOSPITAL_COMMUNITY)
Admission: EM | Admit: 2020-08-13 | Discharge: 2020-08-13 | Disposition: A | Discharge status: Home / Self Care | Payer: Self-pay | Attending: Emergency Medicine | Admitting: Emergency Medicine

## 2020-08-13 DIAGNOSIS — Z5321 Procedure and treatment not carried out due to patient leaving prior to being seen by health care provider: Secondary | ICD-10-CM | POA: Insufficient documentation

## 2020-08-13 DIAGNOSIS — F10129 Alcohol abuse with intoxication, unspecified: Secondary | ICD-10-CM | POA: Insufficient documentation

## 2020-08-13 DIAGNOSIS — E860 Dehydration: Secondary | ICD-10-CM | POA: Insufficient documentation

## 2020-08-13 NOTE — ED Triage Notes (Signed)
Pt BIB EMS. Pt reports dehydration and has also been drinking alcohol today. Pt is yelling and requesting to go to the lobby.

## 2020-08-13 NOTE — ED Notes (Signed)
Pt refusing to go into room and receive care. Pt is yelling and is very uncooperative. Pt requested to be wheeled out to the front to "drink water and smoke a cigarette".

## 2020-08-13 NOTE — ED Notes (Signed)
Patient arrives via EMS-verbally abusive, demanding, wanting to go to lobby-refusing to be triaged-stating he will smoke where ever he wants-

## 2020-08-18 ENCOUNTER — Other Ambulatory Visit: Payer: Self-pay

## 2020-08-18 ENCOUNTER — Emergency Department (HOSPITAL_COMMUNITY)
Admission: EM | Admit: 2020-08-18 | Discharge: 2020-08-19 | Disposition: A | Discharge status: Home / Self Care | Payer: Medicaid Other | Attending: Emergency Medicine | Admitting: Emergency Medicine

## 2020-08-18 ENCOUNTER — Encounter (HOSPITAL_COMMUNITY): Payer: Self-pay | Admitting: Emergency Medicine

## 2020-08-18 DIAGNOSIS — Z8547 Personal history of malignant neoplasm of testis: Secondary | ICD-10-CM | POA: Insufficient documentation

## 2020-08-18 DIAGNOSIS — F17210 Nicotine dependence, cigarettes, uncomplicated: Secondary | ICD-10-CM | POA: Insufficient documentation

## 2020-08-18 DIAGNOSIS — R600 Localized edema: Secondary | ICD-10-CM | POA: Insufficient documentation

## 2020-08-18 DIAGNOSIS — F309 Manic episode, unspecified: Secondary | ICD-10-CM | POA: Insufficient documentation

## 2020-08-18 DIAGNOSIS — F22 Delusional disorders: Secondary | ICD-10-CM | POA: Insufficient documentation

## 2020-08-18 DIAGNOSIS — F419 Anxiety disorder, unspecified: Secondary | ICD-10-CM | POA: Insufficient documentation

## 2020-08-18 DIAGNOSIS — Z20822 Contact with and (suspected) exposure to covid-19: Secondary | ICD-10-CM | POA: Insufficient documentation

## 2020-08-18 LAB — CBC WITH DIFFERENTIAL/PLATELET
Abs Immature Granulocytes: 0.01 10*3/uL (ref 0.00–0.07)
Basophils Absolute: 0 10*3/uL (ref 0.0–0.1)
Basophils Relative: 1 %
Eosinophils Absolute: 0.1 10*3/uL (ref 0.0–0.5)
Eosinophils Relative: 2 %
HCT: 36.6 % — ABNORMAL LOW (ref 39.0–52.0)
Hemoglobin: 12.1 g/dL — ABNORMAL LOW (ref 13.0–17.0)
Immature Granulocytes: 0 %
Lymphocytes Relative: 25 %
Lymphs Abs: 1.4 10*3/uL (ref 0.7–4.0)
MCH: 29.1 pg (ref 26.0–34.0)
MCHC: 33.1 g/dL (ref 30.0–36.0)
MCV: 88 fL (ref 80.0–100.0)
Monocytes Absolute: 0.6 10*3/uL (ref 0.1–1.0)
Monocytes Relative: 11 %
Neutro Abs: 3.5 10*3/uL (ref 1.7–7.7)
Neutrophils Relative %: 61 %
Platelets: 302 10*3/uL (ref 150–400)
RBC: 4.16 MIL/uL — ABNORMAL LOW (ref 4.22–5.81)
RDW: 13.5 % (ref 11.5–15.5)
WBC: 5.6 10*3/uL (ref 4.0–10.5)
nRBC: 0 % (ref 0.0–0.2)

## 2020-08-18 LAB — RAPID URINE DRUG SCREEN, HOSP PERFORMED
Amphetamines: NOT DETECTED
Barbiturates: NOT DETECTED
Benzodiazepines: NOT DETECTED
Cocaine: NOT DETECTED
Opiates: NOT DETECTED
Tetrahydrocannabinol: POSITIVE — AB

## 2020-08-18 LAB — RESP PANEL BY RT-PCR (FLU A&B, COVID) ARPGX2
Influenza A by PCR: NEGATIVE
Influenza B by PCR: NEGATIVE
SARS Coronavirus 2 by RT PCR: NEGATIVE

## 2020-08-18 LAB — COMPREHENSIVE METABOLIC PANEL
ALT: 31 U/L (ref 0–44)
AST: 34 U/L (ref 15–41)
Albumin: 3.9 g/dL (ref 3.5–5.0)
Alkaline Phosphatase: 56 U/L (ref 38–126)
Anion gap: 10 (ref 5–15)
BUN: 12 mg/dL (ref 8–23)
CO2: 24 mmol/L (ref 22–32)
Calcium: 9.9 mg/dL (ref 8.9–10.3)
Chloride: 98 mmol/L (ref 98–111)
Creatinine, Ser: 0.78 mg/dL (ref 0.61–1.24)
GFR, Estimated: >60 mL/min (ref >60)
Glucose, Bld: 103 mg/dL — ABNORMAL HIGH (ref 70–99)
Potassium: 3.7 mmol/L (ref 3.5–5.1)
Sodium: 132 mmol/L — ABNORMAL LOW (ref 135–145)
Total Bilirubin: 1.1 mg/dL (ref 0.3–1.2)
Total Protein: 6.6 g/dL (ref 6.5–8.1)

## 2020-08-18 LAB — LITHIUM LEVEL: Lithium Lvl: <0.06 mmol/L — ABNORMAL LOW (ref 0.60–1.20)

## 2020-08-18 LAB — VALPROIC ACID LEVEL: Valproic Acid Lvl: <10 ug/mL — ABNORMAL LOW (ref 50.0–100.0)

## 2020-08-18 LAB — ETHANOL: Alcohol, Ethyl (B): <10 mg/dL (ref <10)

## 2020-08-18 MED ORDER — LORAZEPAM 1 MG PO TABS
1.0000 mg | ORAL_TABLET | ORAL | Status: DC | PRN
  Filled 2020-08-18: qty 1

## 2020-08-18 MED ORDER — OLANZAPINE 5 MG PO TBDP
5.0000 mg | ORAL_TABLET | Freq: Three times a day (TID) | ORAL | Status: DC | PRN
  Filled 2020-08-18 (×2): qty 1

## 2020-08-18 MED ORDER — DIPHENHYDRAMINE HCL 50 MG/ML IJ SOLN
INTRAMUSCULAR | Status: AC
  Administered 2020-08-18: 25 mg via INTRAMUSCULAR
  Filled 2020-08-18: qty 1

## 2020-08-18 MED ORDER — ZIPRASIDONE MESYLATE 20 MG IM SOLR
20.0000 mg | INTRAMUSCULAR | Status: AC | PRN
Start: 2020-08-18 — End: 2020-08-18
  Administered 2020-08-18: 20 mg via INTRAMUSCULAR
  Filled 2020-08-18: qty 20

## 2020-08-18 MED ORDER — STERILE WATER FOR INJECTION IJ SOLN
INTRAMUSCULAR | Status: AC
  Administered 2020-08-18: 1.2 mL
  Filled 2020-08-18: qty 10

## 2020-08-18 MED ORDER — DIPHENHYDRAMINE HCL 50 MG/ML IJ SOLN: 25.0000 mg | Freq: Once | INTRAMUSCULAR | Status: AC

## 2020-08-18 MED ORDER — LORAZEPAM 2 MG/ML IJ SOLN
INTRAMUSCULAR | Status: AC
  Administered 2020-08-18: 2 mg via INTRAMUSCULAR
  Filled 2020-08-18: qty 1

## 2020-08-18 MED ORDER — LORAZEPAM 2 MG/ML IJ SOLN: 2.0000 mg | Freq: Once | INTRAMUSCULAR | Status: AC

## 2020-08-18 NOTE — ED Notes (Signed)
Pt belongings being removed from room. All equipment that could be used for self-harm or weapons removed from room. Pt's crutches removed from room d/t risk for use as weapon.

## 2020-08-18 NOTE — ED Notes (Signed)
Pt refused PO zyprexa and ativan. Pt behaviors escalating and threatening to leave. Notified provider. IM geodon to be given w/ security at bedside. Pt being IVC'd.

## 2020-08-18 NOTE — ED Notes (Signed)
Pt is extremely agitated. Pt was heard yelling from the nurse's station. This tech and pt's RN entered the room and pt was rapidly speaking about how he was only given a spoon to eat his food. Pt also began stating his coffee was not filled all the way and stated "I am God." Explained to pt he could not yell and scream. Pt was then given two washcloths, per pt's request.

## 2020-08-18 NOTE — ED Notes (Signed)
TTS assessor contacted this RN notifying of readiness to assess pt. Notified assessor that I would let someone know so we could get the TTS machine in the pt's room for assessment.

## 2020-08-18 NOTE — ED Triage Notes (Signed)
Pt has multiple complaints. Pt requesting meds to help him sleep (valium), reports R foot pain and swelling (Pt has L BKA and uses crutches), wants his toe nails trimmed, saying he thinks he is dehydrated, wants his intermittent abd pain discussed that has been going on for "a long time". Pt also requesting to speak to a doctor, chaplain and psychiatrist. Pt A&Ox4 and in NAD at this time.

## 2020-08-18 NOTE — ED Notes (Signed)
Per staffing, no sitters available at this time, will send one when someone is available.

## 2020-08-18 NOTE — ED Notes (Signed)
Pt walking unstable around waiting room. Staff repeatedly asked him to sit down d/t his recent fall. Pt becoming irritable and speaking rudely to staff. Pt stated he is going to the bus stop.

## 2020-08-18 NOTE — ED Notes (Signed)
Security notified to wand pt

## 2020-08-18 NOTE — ED Notes (Addendum)
Came out of another pt's room and heard pt screaming at staff. Went to check on pt and pt was eating his meal tray that was provided. Pt was upset that he didn't have a fork. Pt verbally aggressive w/ staff. Security called to bedside. Off-duty [employer] officer took possession of pt's multitool that pt had hanging on doorknob. Notified MD about pt behavior.

## 2020-08-18 NOTE — BH Assessment (Signed)
Attempted TTS assessment. 20- Per pt's RN "prior nurse had to sedate him due to his behavior." RN said he could not be assessed at this time.   Adolpho Meenach T. Mare Ferrari, [place], [facility], [facility] Triage Specialist [facility]

## 2020-08-18 NOTE — ED Notes (Signed)
Pt standing at bedside with all monitoring equipment removed

## 2020-08-18 NOTE — ED Notes (Signed)
Pt lying in bed w/ sheet covering him. Pt resting w/ rise and fall of chest noted.

## 2020-08-18 NOTE — ED Notes (Addendum)
1 belongings bag placed in locker # 7 and 2 crutches placed on countertop in purple w/ stickers on them.

## 2020-08-18 NOTE — ED Notes (Signed)
Notified by TTS assessor that they are unable to assess pt at this time and will try again later.

## 2020-08-18 NOTE — ED Notes (Signed)
Security, this RN, another Therapist, sports and ED tech at bedside. Instructed pt that we had to take his belongings and lock them up per policy. Pt screaming, cussing and threatening staff. Pt then began taking his clothes off and throwing them at staff, pt pulled ley off neck and threw it at staff, pt then took his wallet that hangs from his neck and threw it at staff. Pt then refused to put scrubs on. Pt naked in bed requesting just a sheet.

## 2020-08-18 NOTE — ED Notes (Signed)
Pt request 3 milks.

## 2020-08-18 NOTE — ED Notes (Signed)
Pt resting with eyes closed at this time. Charge RN Gretta Cool made aware pt does not have a Actuary. Will continue to monitor.

## 2020-08-18 NOTE — ED Provider Notes (Signed)
[facility] EMERGENCY DEPARTMENT Provider Note   CSN: [id number] Arrival date & time: 08/18/20  0456     History Chief Complaint  Patient presents with   Multiple Complaints    Andrew Chaney is a 65 y.o. male.  HPI Patient presents for multiple complaints.  He has a history of left BKA.  He ambulates with crutches.  He is worried about swelling in his right leg.  He states that he needs to talk to a psychiatrist and a Clinical biochemist.  He believes that he has the powers of Jesus and can change the weather.  He denies any SI or HI.  He denies any auditory or visual hallucinations but states that he is ready to hear the voice of God.  He denies any recent alcohol use.  He does endorse intermittent cannabis use but denies any other illicit drug use.  Patient does have a psychiatric history and is on Depakote and lithium, per chart review.  Patient requests benzodiazepines to help him sleep.  Currently he states [place].  He denies current homelessness.  He denies any recent infectious symptoms, including fever, chills, vomiting, diarrhea.  He has been able to ambulate with use of crutches without difficulty.  There was an early report of a fall, however, patient denies this.  Patient denies any recent traumas.    Past Medical History:  Diagnosis Date   Bipolar 1 disorder ([place])    Cancer ([place]) 2011   Germ Cell Seminoma    Renal disorder     Patient Active Problem List   Diagnosis Date Noted   Substance induced mood disorder ([place]) 08/01/2020   Alcohol use disorder, severe, dependence ([place])    S/P BKA (below knee amputation) unilateral, left (HCC)    Protein-calorie malnutrition, severe 04/25/2020   Left foot pain    IVDU (intravenous drug user)    Acute osteomyelitis of left calcaneus ([place])    Cutaneous abscess of left foot    Subacute osteomyelitis, left ankle and foot ([place]) 04/15/2020   Sepsis due to skin infection ([place]) 04/15/2020   Insomnia  03/23/2020   Dupuytren's contracture 08/05/2019   MRSA bacteremia 07/31/2019   Altered mental status    Fever 07/30/2019   Non-purulent Cellulitis of right upper arm 07/30/2019   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   Seizure ([place]) 07/26/2019   Status epilepticus ([place]) 07/26/2019   Psychosis ([place]) 09/05/2018   Affective psychosis, bipolar ([place]) 04/11/2018   Bipolar I disorder, current or most recent episode manic, with psychotic features ([place]) 04/08/2018   Confusion 01/03/2015   Chronic hepatitis C ([place]) 12/20/2012   Renal failure 12/17/2012   Acute encephalopathy 12/17/2012   ARF (acute renal failure) ([place]) 12/17/2012   Hyponatremia 12/17/2012   CAP (community acquired pneumonia) 12/17/2012   Rhabdomyolysis 12/17/2012   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 ENDOSCOPY;  Service: Cardiovascular;  Laterality: N/A;   TEE WITHOUT CARDIOVERSION N/A 04/21/2020   Procedure: TRANSESOPHAGEAL ECHOCARDIOGRAM (TEE);  Surgeon: Buford Dresser, MD;  Location: [facility] ENDOSCOPY;  Service: Cardiovascular;  Laterality: N/A;       Family History  Problem Relation Age of Onset   Dementia Mother     Social History   Tobacco Use   Smoking status: Every Day    Packs/day: 0.50    Types: Cigarettes   Smokeless tobacco: Never  Vaping Use   Vaping Use: Never used  Substance Use Topics   Alcohol use: Yes   Drug use: Yes    Types: Marijuana    Comment: States no longer uses marijuana because he is "on probation"    Home Medications Prior to Admission medications   Medication Sig Start Date End Date  Taking? Authorizing Provider  acetaminophen (TYLENOL) 500 MG tablet Take 1 tablet (500 mg total) by mouth every 8 (eight) hours as needed for moderate pain. Patient not taking: Reported on 08/18/2020 05/09/20 05/09/21  Thurnell Lose, MD  ascorbic acid (VITAMIN C) 1000 MG tablet Take 1 tablet (1,000 mg total) by mouth daily. Patient not taking: Reported on 08/18/2020 05/10/20   Thurnell Lose, MD  bacitracin ointment Apply 1 application topically 2 (two) times daily. Patient not taking: Reported on 08/18/2020 07/20/20   Mesner, Corene Cornea, MD  cephALEXin (KEFLEX) 500 MG capsule Take 1 capsule (500 mg total) by mouth 4 (four) times daily. Patient not taking: Reported on 08/18/2020 07/20/20   Mesner, Corene Cornea, MD  cyanocobalamin (,VITAMIN B-12,) 1000 MCG/ML injection Inject 1 mL (1,000 mcg total) into the skin every 30 (thirty) days. Patient not taking: Reported on 08/18/2020 05/09/20   Thurnell Lose, MD  DEPAKOTE 250 MG DR tablet Take 1 tablet (250 mg total) by mouth 2 (two) times daily. Patient not taking: Reported on 08/18/2020 08/01/20 08/01/21  Hayden Rasmussen, MD  doxycycline (VIBRAMYCIN) 100 MG capsule Take 1 capsule (100 mg total) by mouth 2 (two) times daily. Patient not taking: Reported on 08/18/2020 08/01/20   Hayden Rasmussen, MD  folic acid (FOLVITE) 1 MG tablet Take 1 tablet (1 mg total) by mouth daily. Patient not taking: Reported on 08/18/2020 05/09/20   Thurnell Lose, MD  Glecaprevir-Pibrentasvir (MAVYRET) 100-40 MG TABS Take 3 tablets by mouth daily with breakfast. Patient not taking: Reported on 08/18/2020 06/12/20   Kuppelweiser, Cassie L, RPH-CPP  ibuprofen (ADVIL) 400 MG tablet Take 1 tablet (400 mg total) by mouth every 6 (six) hours as needed for headache or mild pain. Patient not taking: Reported on 08/18/2020 05/09/20   Thurnell Lose, MD  levETIRAcetam (KEPPRA) 500 MG tablet Take 1 tablet (500 mg total) by mouth 2 (two) times daily. Patient not taking: Reported on 08/18/2020 05/09/20 06/08/20   Thurnell Lose, MD  lithium carbonate 300 MG capsule Take 1 capsule (300 mg total) by mouth 2 (two) times daily with a meal. Patient not taking: Reported on 08/18/2020 05/09/20   Thurnell Lose, MD  pantoprazole (PROTONIX) 40 MG tablet Take 1 tablet (40 mg total) by mouth daily. Patient not taking: Reported on 08/18/2020 05/10/20   Thurnell Lose, MD  pregabalin (LYRICA) 150 MG capsule Take 1 capsule (150 mg total) by mouth 3 (three) times daily. Patient not taking: Reported on 08/18/2020 05/09/20   Thurnell Lose, MD  thiamine 100 MG tablet Take 2.5 tablets (250 mg total) by mouth daily. Patient not taking: Reported on 08/18/2020 05/10/20   Thurnell Lose, MD    Allergies    Depakote [divalproex sodium]  Review of Systems   Review of Systems  Constitutional:  Negative for activity change, appetite change, chills, fatigue and fever.  HENT:  Negative for  ear pain and sore throat.   Eyes:  Negative for pain and visual disturbance.  Respiratory:  Negative for cough and shortness of breath.   Cardiovascular:  Positive for leg swelling. Negative for chest pain and palpitations.  Gastrointestinal:  Negative for abdominal pain, diarrhea, nausea and vomiting.  Genitourinary:  Negative for dysuria and hematuria.  Musculoskeletal:  Negative for arthralgias, back pain, gait problem, joint swelling, myalgias and neck pain.  Skin:  Negative for color change, rash and wound.  Neurological:  Negative for dizziness, seizures, syncope, weakness, light-headedness, numbness and headaches.  Hematological:  Does not bruise/bleed easily.  Psychiatric/Behavioral:  Positive for agitation, behavioral problems and sleep disturbance. Negative for hallucinations, self-injury and suicidal ideas.   All other systems reviewed and are negative.  Physical Exam Updated Vital Signs BP 124/83   Pulse 74   Temp 98 F (36.7 C)   Resp 16   SpO2 100%   Physical Exam Vitals and nursing note reviewed.   Constitutional:      General: He is not in acute distress.    Appearance: Normal appearance. He is well-developed and normal weight. He is not ill-appearing, toxic-appearing or diaphoretic.  HENT:     Head: Normocephalic and atraumatic.     Right Ear: External ear normal.     Left Ear: External ear normal.     Nose: Nose normal. No congestion or rhinorrhea.     Mouth/Throat:     Mouth: Mucous membranes are moist.     Pharynx: Oropharynx is clear.  Eyes:     Extraocular Movements: Extraocular movements intact.     Conjunctiva/sclera: Conjunctivae normal.  Cardiovascular:     Rate and Rhythm: Normal rate and regular rhythm.     Heart sounds: No murmur heard. Pulmonary:     Effort: Pulmonary effort is normal. No respiratory distress.     Breath sounds: Normal breath sounds. No wheezing or rales.  Abdominal:     Palpations: Abdomen is soft.     Tenderness: There is no abdominal tenderness. There is no guarding.  Musculoskeletal:        General: No swelling, tenderness, deformity or signs of injury.     Cervical back: Neck supple. No rigidity or tenderness.     Comments: Left BKA.  Skin:    General: Skin is warm and dry.  Neurological:     General: No focal deficit present.     Mental Status: He is alert and oriented to person, place, and time.     Cranial Nerves: No cranial nerve deficit.     Sensory: No sensory deficit.     Motor: No weakness.  Psychiatric:        Attention and Perception: Attention and perception normal.        Mood and Affect: Mood is anxious.        Speech: Speech is rapid and pressured and tangential.        Behavior: Behavior is hyperactive.        Thought Content: Thought content is delusional.    ED Results / Procedures / Treatments   Labs (all labs ordered are listed, but only abnormal results are displayed) Labs Reviewed  COMPREHENSIVE METABOLIC PANEL - Abnormal; Notable for the following components:      Result Value   Sodium 132 (*)     Glucose, Bld 103 (*)    All other components within normal limits  CBC WITH DIFFERENTIAL/PLATELET - Abnormal; Notable for the following components:   RBC  4.16 (*)    Hemoglobin 12.1 (*)    HCT 36.6 (*)    All other components within normal limits  LITHIUM LEVEL - Abnormal; Notable for the following components:   Lithium Lvl <0.06 (*)    All other components within normal limits  VALPROIC ACID LEVEL - Abnormal; Notable for the following components:   Valproic Acid Lvl <10 (*)    All other components within normal limits  RESP PANEL BY RT-PCR (FLU A&B, COVID) ARPGX2  ETHANOL  RAPID URINE DRUG SCREEN, HOSP PERFORMED    EKG None  Radiology No results found.  Procedures Procedures   Medications Ordered in ED Medications  OLANZapine zydis (ZYPREXA) disintegrating tablet 5 mg (has no administration in time range)    And  LORazepam (ATIVAN) tablet 1 mg (has no administration in time range)    And  ziprasidone (GEODON) injection 20 mg (has no administration in time range)  sterile water (preservative free) injection (has no administration in time range)    ED Course  I have reviewed the triage vital signs and the nursing notes.  Pertinent labs & imaging results that were available during my care of the patient were reviewed by me and considered in my medical decision making (see chart for details).  Clinical Course as of 08/18/20 1840  Mon Aug 18, 2020  1808 Pressured speech, delusions, pending pysch [MK]    Clinical Course User Index [MK] Kommor, Debe Coder, MD   MDM Rules/Calculators/A&P                           Patient is a 65 year old male who presents for signs of acute mania.  Initially presented for multiple complaints.  He was requesting to be admitted to the hospital, speak to a chaplain and a psychiatrist.  Initial examination is notable for rapid and pressured speech, tangential thoughts, delusions, including that he has gotten can change of weather.  Patient, at  this time, denies any auditory or visual loose [place].  He denies any suicidal ideation.  He does state that he feels homicidal towards people who do harm to him.  Initial screening labs were ordered.  Laboratory work-up was unremarkable.  Of note, patient is prescribed lithium and Depakote, both of which were undetectable.  Follow initial screening, TTS was consulted.  While in the ED, patient had increased agitation.  As needed agitation medications were ordered.  Patient was IVC'd pending further psychiatric evaluation.  Final Clinical Impression(s) / ED Diagnoses Final diagnoses:  [facility])    Rx / [place] Orders ED Discharge Orders     None        Godfrey Pick, MD 08/18/20 1840

## 2020-08-18 NOTE — ED Triage Notes (Signed)
Patient arrived with EMS from street ( homeless) intoxicated with ETOH and fell this morning , no LOC / denies injury , alert and oriented /respirations unlabored.

## 2020-08-19 ENCOUNTER — Encounter (HOSPITAL_COMMUNITY): Payer: Self-pay | Admitting: Registered Nurse

## 2020-08-19 NOTE — Discharge Instructions (Addendum)
For your behavioral health needs you are advised to follow up with [facility] at your earliest opportunity:      [facility]      [place], [place] [address]      [phone]      They offer psychiatry/medication management, therapy and substance use disorder treatment.  New patients are seen in their walk-in clinic.  Walk-in hours are Monday - Thursday from 8:00 am - 11:00 am for psychiatry, and Friday from 1:00 pm - 4:00 pm for therapy.  Walk-in patients are seen on a first come, first served basis, so try to arrive as early as possible for the best chance of being seen the same day.

## 2020-08-19 NOTE — ED Notes (Signed)
Breakfast Orders Placed °
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
## 2020-08-19 NOTE — ED Notes (Signed)
Sitter asked RN to come to bedside, pt asking for new linens, and a TV remote. RN at beside, explained to pt POC. Pt stated he had no mental health problems, RN explained that pt was under IVC. Pt said "I know but the only reason I acted like that was because my cigarettes are missing." New linens placed on bed, pt making  inappropriate comments to RN, RN asked pt to stop and that type of behavior would not be tolerated. Pt refusing to eat lunch tray, stating he had jaw surgery years ago and therefore cannot eat.
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
## 2020-08-19 NOTE — Progress Notes (Signed)
Pt. tried to get up out off bed because he wanted to go to the bathroom.  The writer did go get a staxi wheelchair for pt. instead of pt's crutches which is what he asked for.  Pt was able to ambulate from the bed to the staxi wheelchair with no problems.  This writer got pt. to the bathrrom where the pt. ambulated from the staxi wheelerchair to the toilet with no problems.  Pt. did have a very small bile movement which had a little blood in the toilet.  Pt. did not wipe himself but did get back in staxi wheelchair with no problem and this writer flushed the toilet and took pt. back to the room where he was able to get back in bed with no problems.  This writer did warm two washcloths put a little soap on them and wiped the pt's.bottom.  The writer also wiped down the staxi wheelchair and place it in the hallway.  Pt. went back to sleep with no problems or concerns.

## 2020-08-23 ENCOUNTER — Emergency Department (HOSPITAL_COMMUNITY)
Admission: EM | Admit: 2020-08-23 | Discharge: 2020-08-23 | Discharge status: Against Medical Advice | Payer: Medicaid Other
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
## 2020-08-27 ENCOUNTER — Encounter (HOSPITAL_COMMUNITY): Payer: Self-pay

## 2020-08-27 ENCOUNTER — Emergency Department (HOSPITAL_COMMUNITY)
Admission: EM | Admit: 2020-08-27 | Discharge: 2020-08-27 | Disposition: A | Discharge status: Home / Self Care | Payer: Medicaid Other | Attending: Emergency Medicine | Admitting: Emergency Medicine

## 2020-08-27 ENCOUNTER — Other Ambulatory Visit: Payer: Self-pay

## 2020-08-27 DIAGNOSIS — R1904 Left lower quadrant abdominal swelling, mass and lump: Secondary | ICD-10-CM | POA: Insufficient documentation

## 2020-08-27 DIAGNOSIS — R1903 Right lower quadrant abdominal swelling, mass and lump: Secondary | ICD-10-CM | POA: Insufficient documentation

## 2020-08-27 DIAGNOSIS — Z5321 Procedure and treatment not carried out due to patient leaving prior to being seen by health care provider: Secondary | ICD-10-CM | POA: Insufficient documentation

## 2020-08-27 NOTE — ED Triage Notes (Addendum)
Pt [place] EMS [place] [employer] [place] with c/o mass on lower abdomen; both sides x 2 years. Denies pain, nvd.

## 2020-08-27 NOTE — ED Notes (Signed)
Pt had to use the restroom, He hopped on his good leg to the lobby restrooms with no assistance. Offered him a urinal and he said put it on his personal wheelchair.

## 2020-08-28 ENCOUNTER — Encounter (HOSPITAL_COMMUNITY): Payer: Self-pay

## 2020-08-28 ENCOUNTER — Emergency Department (HOSPITAL_COMMUNITY)
Admission: EM | Admit: 2020-08-28 | Discharge: 2020-08-28 | Disposition: A | Discharge status: Home / Self Care | Payer: Medicaid Other | Attending: Emergency Medicine | Admitting: Emergency Medicine

## 2020-08-28 ENCOUNTER — Other Ambulatory Visit: Payer: Self-pay

## 2020-08-28 DIAGNOSIS — Z5321 Procedure and treatment not carried out due to patient leaving prior to being seen by health care provider: Secondary | ICD-10-CM | POA: Insufficient documentation

## 2020-08-28 DIAGNOSIS — I879 Disorder of vein, unspecified: Secondary | ICD-10-CM | POA: Insufficient documentation

## 2020-08-28 NOTE — ED Notes (Signed)
Pt arrived to ED and layed himself on the floor with blankets and was sleeping. Pt did not check in during this. Pt stated that "I did not know I needed to check in to sleep here."

## 2020-08-28 NOTE — ED Triage Notes (Signed)
Pt complains of veins on his side that do not cause pain.

## 2020-08-28 NOTE — ED Notes (Signed)
Went to the lobby to call patient back to be seen by a provider. Pt refused to come back to be seen and refusing to leave. Pt escorted off the property by security.

## 2022-06-27 IMAGING — MR MR HEAD WO/W CM
9 of 13 series · 34 of 48 positions shown · IV contrast (gadavist)
Comparison: MRI head 07/28/2019 and 07/27/2019

CLINICAL DATA: Seizure

EXAM:
MRI HEAD WITHOUT AND WITH CONTRAST
TECHNIQUE: Multiplanar, multiecho pulse sequences of the brain and surrounding
structures were obtained without and with intravenous contrast.
CONTRAST:  7.5mL GADAVIST GADOBUTROL 1 MMOL/ML IV SOLN

[Series 4: DWI · axial · 3.0mm · 1.09mm/px · z∈[-44,+100]mm · 9 of 98 slices shown (1 of 4)]
[im 1/98]
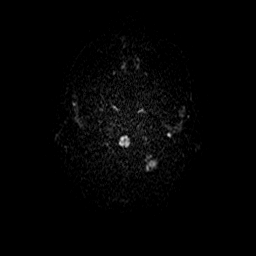
[im 13/98]
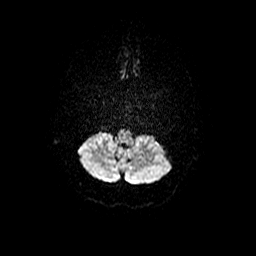
[im 25/98]
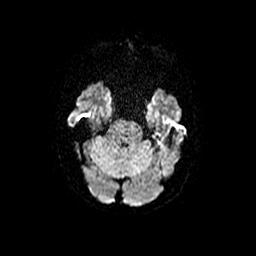
[im 37/98]
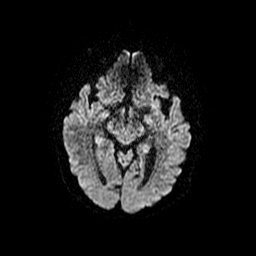
[im 49/98]
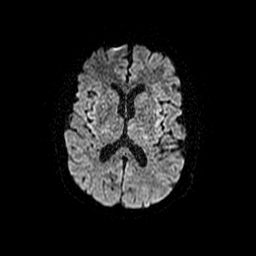
[im 61/98]
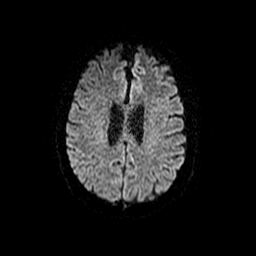
[im 73/98]
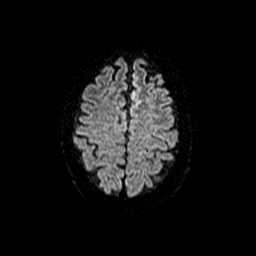
[im 85/98]
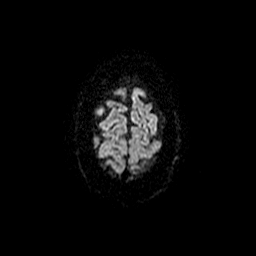
[im 98/98]
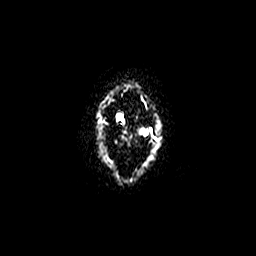

[Series 5: DWI · coronal · 5.0mm · 1.09mm/px · 6 of 76 slices shown (2 of 4)]
[im 1/76]
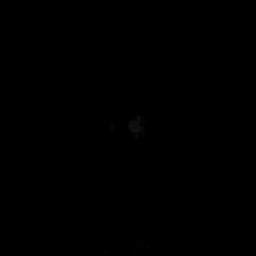
[im 16/76]
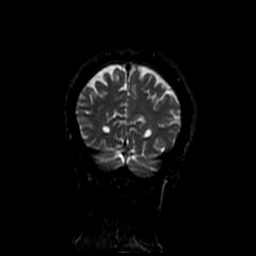
[im 31/76]
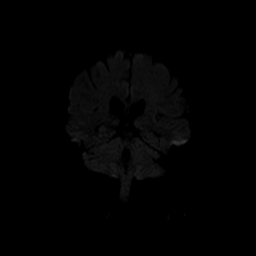
[im 46/76]
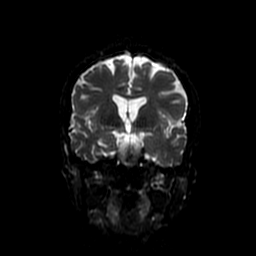
[im 61/76]
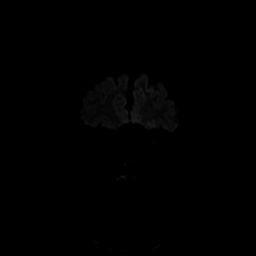
[im 76/76]
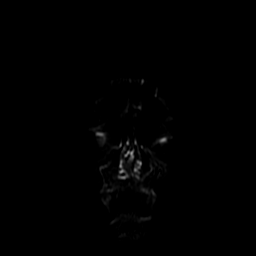

[Series 7: T2 · axial · 5.0mm · 0.47mm/px · z∈[-42,+107]mm · 2 of 26 slices shown]
[im 1/26]
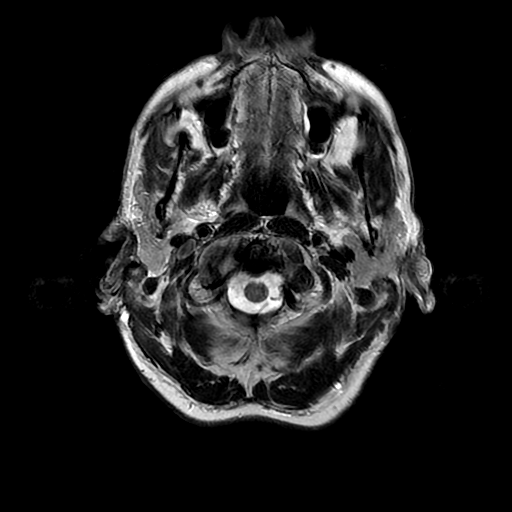
[im 26/26]
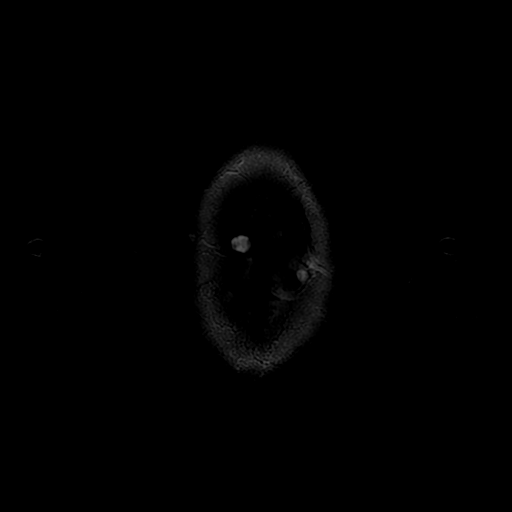

[Series 8: FLAIR · axial · 3.0mm · 0.47mm/px · z∈[-42,+108]mm · 2 of 26 slices shown]
[im 1/26]
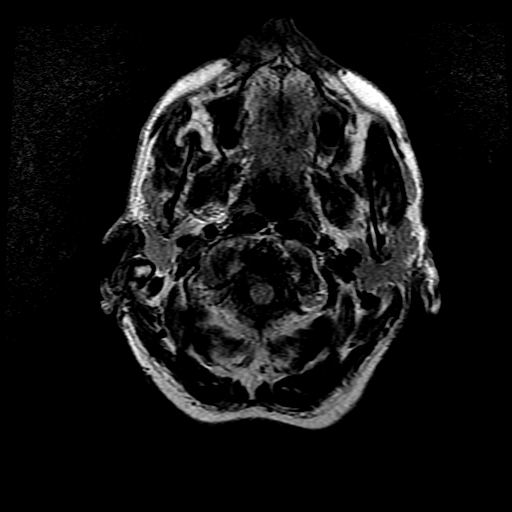
[im 26/26]
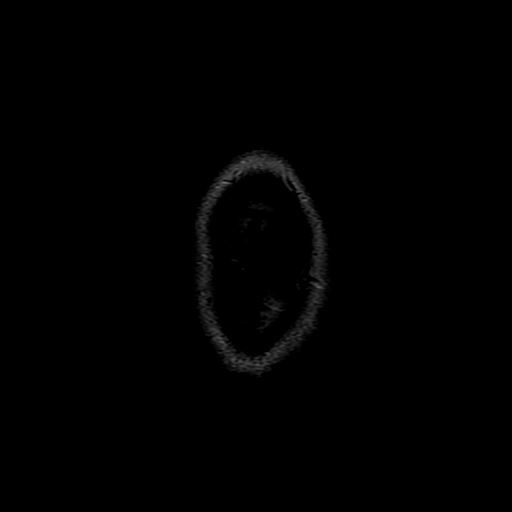

[Series 12: T1 post-contrast · axial · 3.0mm · 0.47mm/px · z∈[-42,+111]mm · 4 of 52 slices shown (1 of 3)]
[im 1/52]
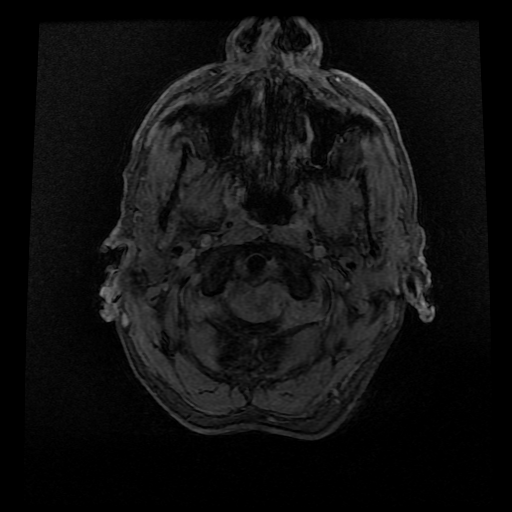
[im 18/52]
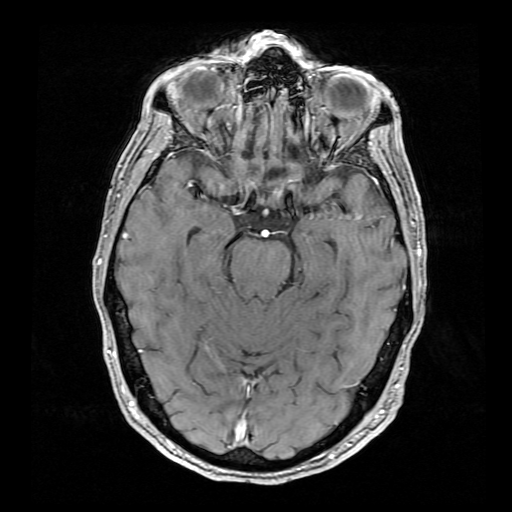
[im 35/52]
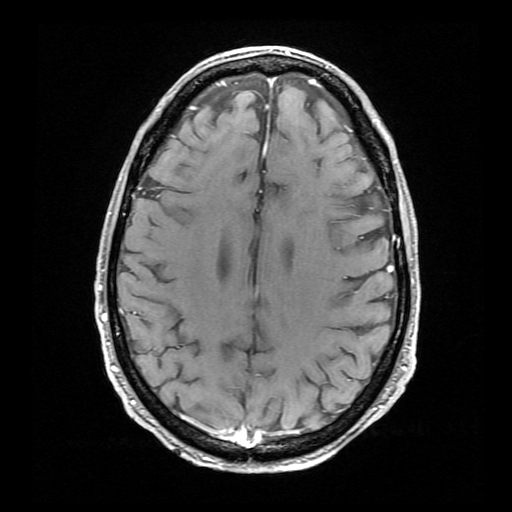
[im 52/52]
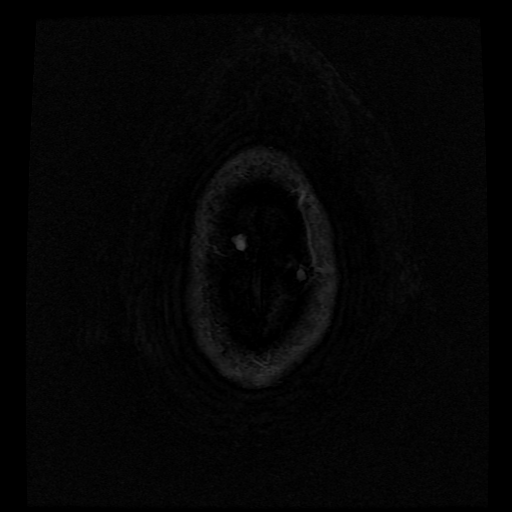

[Series 13: T1 post-contrast · coronal · 5.0mm · 0.39mm/px · 2 of 31 slices shown (2 of 3)]
[im 1/31]
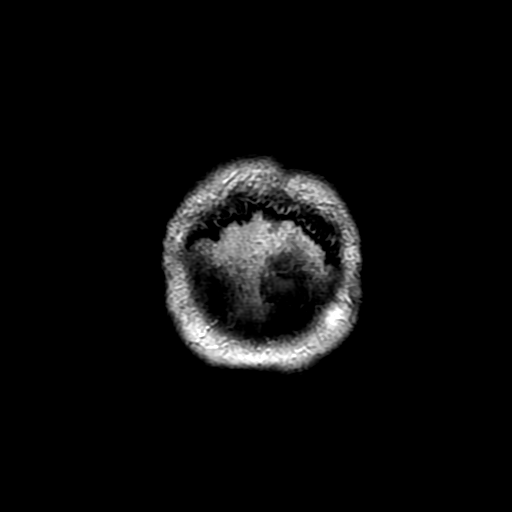
[im 31/31]
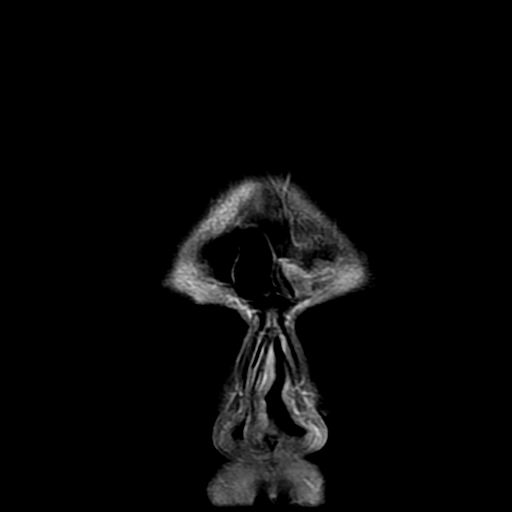

[Series 14: T1 post-contrast · sagittal · 5.0mm · 0.47mm/px · 2 of 25 slices shown (3 of 3)]
[im 1/25]
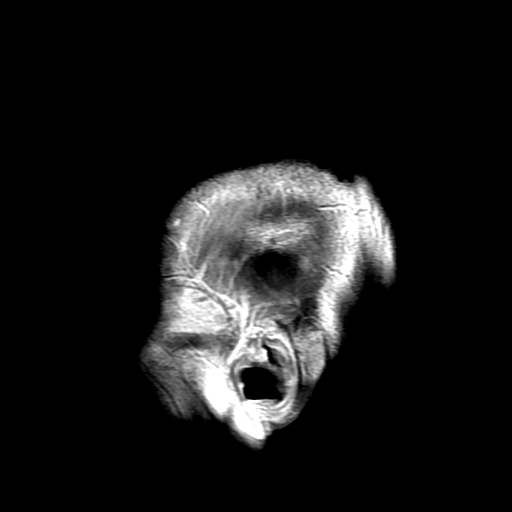
[im 25/25]
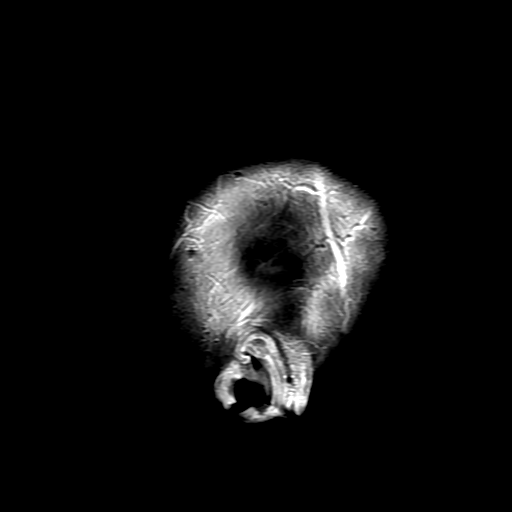

[Series 400: DWI · axial · 3.0mm · 1.09mm/px · z∈[-44,+100]mm · 4 of 49 slices shown (3 of 4)]
[im 1/49]
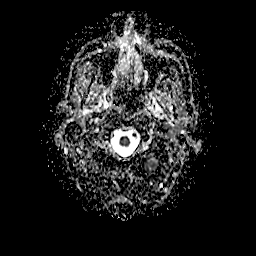
[im 17/49]
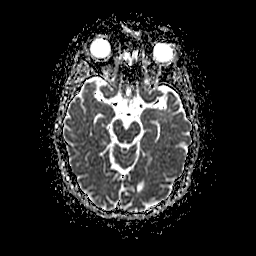
[im 33/49]
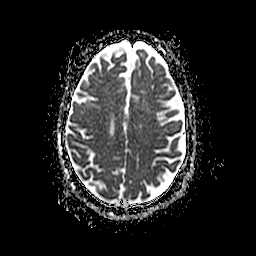
[im 49/49]
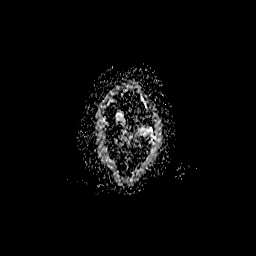

[Series 500: DWI · coronal · 5.0mm · 1.09mm/px · 3 of 38 slices shown (4 of 4)]
[im 1/38]
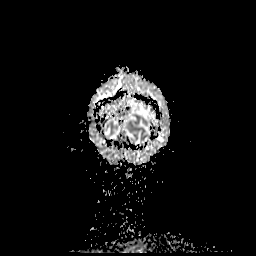
[im 19/38]
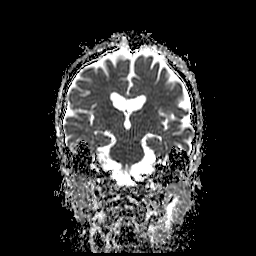
[im 38/38]
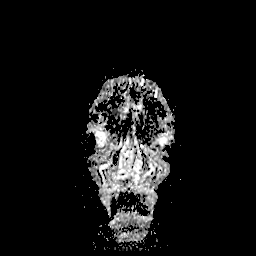

[34 of 48 positions shown; findings below may reference images not displayed]

FINDINGS: Brain: Improving abnormality in the left medial frontal lobe. Mild
residual FLAIR hyperintensity in the cortex remains. Previously
noted diffusion abnormality in this area has nearly completely
resolved.

No new areas of restricted diffusion or FLAIR hyperintensity
identified. There is mild atrophy without hydrocephalus. Negative
for hemorrhage or mass.

Normal enhancement following contrast infusion.

Vascular: Normal arterial flow voids

Skull and upper cervical spine: No focal skeletal lesion.

Sinuses/Orbits: Mild mucosal edema paranasal sinuses. Small right
mastoid effusion. Negative orbit

Other: None
IMPRESSION: Improving and nearly completely resolved abnormal signal in the left
medial frontal lobe. This is likely due to previous seizure activity

Atrophy and minimal chronic microvascular ischemic change in the
white matter. No acute abnormality.
